# Patient Record
Sex: Male | Born: 1970
Health system: Southern US, Community
[De-identification: ages and names within clinical notes are randomized; demographics above are authoritative.]

## PROBLEM LIST (undated history)

## (undated) DIAGNOSIS — E785 Hyperlipidemia, unspecified: Secondary | ICD-10-CM

## (undated) DIAGNOSIS — D649 Anemia, unspecified: Secondary | ICD-10-CM

## (undated) DIAGNOSIS — R519 Headache, unspecified: Secondary | ICD-10-CM

## (undated) DIAGNOSIS — D72829 Elevated white blood cell count, unspecified: Secondary | ICD-10-CM

## (undated) DIAGNOSIS — H532 Diplopia: Secondary | ICD-10-CM

## (undated) DIAGNOSIS — C801 Malignant (primary) neoplasm, unspecified: Secondary | ICD-10-CM

## (undated) DIAGNOSIS — G4730 Sleep apnea, unspecified: Secondary | ICD-10-CM

## (undated) DIAGNOSIS — T7840XA Allergy, unspecified, initial encounter: Secondary | ICD-10-CM

## (undated) DIAGNOSIS — G8929 Other chronic pain: Secondary | ICD-10-CM

## (undated) DIAGNOSIS — C9110 Chronic lymphocytic leukemia of B-cell type not having achieved remission: Secondary | ICD-10-CM

## (undated) DIAGNOSIS — I10 Essential (primary) hypertension: Secondary | ICD-10-CM

## (undated) HISTORY — DX: Hyperlipidemia, unspecified: E78.5

## (undated) HISTORY — PX: BRAIN SURGERY: SHX531

## (undated) HISTORY — DX: Anemia, unspecified: D64.9

## (undated) HISTORY — DX: Chronic lymphocytic leukemia of B-cell type not having achieved remission: C91.10

## (undated) HISTORY — DX: Elevated white blood cell count, unspecified: D72.829

## (undated) HISTORY — DX: Allergy, unspecified, initial encounter: T78.40XA

## (undated) HISTORY — PX: FINGER SURGERY: SHX640

## (undated) HISTORY — PX: EYE SURGERY: SHX253

---

## 2007-02-19 ENCOUNTER — Ambulatory Visit: Admission: RE | Admit: 2007-02-19 | Discharge: 2007-02-19 | Payer: Self-pay | Admitting: Family Medicine

## 2007-02-26 ENCOUNTER — Ambulatory Visit: Payer: Self-pay | Admitting: Pulmonary Disease

## 2010-09-24 NOTE — Procedures (Signed)
NAMEJANICE, David Whitehead NO.:  [id number]   MEDICAL RECORD NO.:  [id number]          PATIENT TYPE:  OUT   LOCATION:  SLEEP CENTER                 FACILITY:  [facility]   PHYSICIAN:  Barbaraann Share, MD,FCCPDATE OF BIRTH:  10-Mar-1973   DATE OF STUDY:  02/19/2007                            NOCTURNAL POLYSOMNOGRAM   REFERRING PHYSICIAN:  Scott A. Luking, MD   INDICATION FOR STUDY:  Hypersomnia with sleep apnea.   EPWORTH SLEEPINESS SCORE:  16.   MEDICATIONS:   SLEEP ARCHITECTURE:  The patient had a total sleep time of 265 minutes  with decreased slow wave sleep as well as REM.  Sleep onset latency was  normal and REM onset was very prolonged at 293 minutes.  Sleep  efficiency was very poor at 68%.   RESPIRATORY DATA:  The patient was found to have 39 obstructive  hypopneas and no obstructive apneas for an apnea/hypopnea index of 9  events per hour.  The events were not positional but there was loud  snoring noted throughout.  The patient did not meet split night protocol  secondary to the small number of events.   OXYGEN DATA:  His O2 saturation was as low as 85% with the patient's  obstructive events.   CARDIAC DATA:  No clinically significant cardiac arrhythmias were noted.   MOVEMENT-PARASOMNIA:  Small numbers of leg jerks with no significant  sleep disruption.   IMPRESSIONS-RECOMMENDATIONS:  Mild obstructive sleep apnea/hypopnea  syndrome with an apnea/hypopnea index of 9 events per hour and oxygen  desaturation as low as 85%.  Treatment for this degree of sleep apnea  can include weight loss alone if applicable, upper airway surgery, oral  appliance, and also CPAP.  The decision to treat this degree of sleep  apnea should be based on how much this is impacting the patient's  quality of  life and also how the treatment options may effect his lifestyle.  This  mild degree of sleep apnea represents very little cardiovascular risk.      Barbaraann Share,  MD,FCCP  Diplomate, American Board of Sleep  Medicine  Electronically Signed     [facility]/MED[facility]  D:  02/26/2007 08:11:12  T:  02/26/2007 23:47:07  Job:  [id number]

## 2018-03-30 ENCOUNTER — Ambulatory Visit: Payer: Self-pay | Admitting: Family Medicine

## 2018-03-31 ENCOUNTER — Ambulatory Visit (HOSPITAL_COMMUNITY)
Admission: RE | Admit: 2018-03-31 | Discharge: 2018-03-31 | Disposition: A | Discharge status: Home / Self Care | Payer: BLUE CROSS/BLUE SHIELD | Source: Ambulatory Visit | Attending: Family Medicine | Admitting: Family Medicine

## 2018-03-31 ENCOUNTER — Ambulatory Visit (INDEPENDENT_AMBULATORY_CARE_PROVIDER_SITE_OTHER): Payer: BLUE CROSS/BLUE SHIELD | Admitting: Family Medicine

## 2018-03-31 VITALS — BP 114/72 | Temp 98.6°F | Wt 213.0 lb

## 2018-03-31 DIAGNOSIS — R590 Localized enlarged lymph nodes: Secondary | ICD-10-CM | POA: Insufficient documentation

## 2018-03-31 DIAGNOSIS — R682 Dry mouth, unspecified: Secondary | ICD-10-CM | POA: Diagnosis not present

## 2018-03-31 DIAGNOSIS — G4730 Sleep apnea, unspecified: Secondary | ICD-10-CM

## 2018-03-31 NOTE — Progress Notes (Signed)
   Subjective:    Patient ID: David Whitehead, male    DOB: 30-Nov-1970, 47 y.o.   MRN: [id number]  HPI Pt here today for swollen lymph nodes. Pt has some swollen under left arm and some on neck. Chiropractor noticed the ones on the neck last week. Not painful, sometimes tender if touched. No treatments.  This patient denies any night sweats He denies any fevers chills Denies fatigue tiredness He states he has been trying to watch his diet and try to lose weight over the past several months and states he is lost 50 pounds over the past several months Patient denies any history of lymphoma or cancers that he is aware of He works in [employer] and is around some chemicals He states the lymph node started up several months ago and recently have seemed to got bigger and appear in other places PMH benign otherwise  Review of Systems  Constitutional: Negative for activity change.  HENT: Negative for congestion and rhinorrhea.   Respiratory: Negative for cough and shortness of breath.   Cardiovascular: Negative for chest pain.  Gastrointestinal: Negative for abdominal pain, diarrhea, nausea and vomiting.  Genitourinary: Negative for dysuria and hematuria.  Neurological: Negative for weakness and headaches.  Psychiatric/Behavioral: Negative for behavioral problems and confusion.       Objective:   Physical Exam  Constitutional: He appears well-nourished.  Cardiovascular: Normal rate, regular rhythm and normal heart sounds.  No murmur heard. Pulmonary/Chest: Effort normal and breath sounds normal.  Musculoskeletal: He exhibits no edema.  Lymphadenopathy:    He has no cervical adenopathy.  Neurological: He is alert.  Psychiatric: His behavior is normal.  Vitals reviewed.  Large amount of lymph nodes are noted he has anterior cervical, posterior cervical, supraclavicular nodes, one under the left axilla.  His lungs do not reveal any crackles or rails heart does not reveal any murmur  abdomen is soft no masses are felt.  No obvious bruising or bleeding is noted       Assessment & Plan:  Significantly concerning for the possibility of a lymphoma or myelodysplastic condition Lab work is indicated as well as a chest x-ray More than likely will need either biopsy of the lymph nodes along with CAT scans Certainly when the test come back we will get oncology hematology involved The patient is aware that this could indicate cancer and we discussed this at length Follow-up will be based upon the results of the tests  Addendum chest x-ray came back looking normal CBC came back showing extremely elevated white blood count pointing toward the possibility of CLL Dr. Burney Gauze was spoken with who stated that they will see the patient on Thursday The patient was notified of this We will be more than happy to assist with any concerns or questions the patient has

## 2018-04-01 ENCOUNTER — Ambulatory Visit: Payer: Self-pay | Admitting: Hematology & Oncology

## 2018-04-01 ENCOUNTER — Ambulatory Visit: Payer: Self-pay

## 2018-04-01 ENCOUNTER — Telehealth: Payer: Self-pay | Admitting: Hematology

## 2018-04-01 ENCOUNTER — Other Ambulatory Visit: Payer: Self-pay | Admitting: Family Medicine

## 2018-04-01 ENCOUNTER — Other Ambulatory Visit: Payer: Self-pay | Admitting: Hematology

## 2018-04-01 ENCOUNTER — Telehealth: Payer: Self-pay | Admitting: Family Medicine

## 2018-04-01 ENCOUNTER — Other Ambulatory Visit: Payer: Self-pay | Admitting: Hematology & Oncology

## 2018-04-01 DIAGNOSIS — C9110 Chronic lymphocytic leukemia of B-cell type not having achieved remission: Secondary | ICD-10-CM

## 2018-04-01 DIAGNOSIS — D72829 Elevated white blood cell count, unspecified: Secondary | ICD-10-CM

## 2018-04-01 DIAGNOSIS — D473 Essential (hemorrhagic) thrombocythemia: Secondary | ICD-10-CM | POA: Insufficient documentation

## 2018-04-01 DIAGNOSIS — D649 Anemia, unspecified: Secondary | ICD-10-CM

## 2018-04-01 DIAGNOSIS — D75839 Thrombocytosis, unspecified: Secondary | ICD-10-CM | POA: Insufficient documentation

## 2018-04-01 DIAGNOSIS — D72820 Lymphocytosis (symptomatic): Secondary | ICD-10-CM

## 2018-04-01 DIAGNOSIS — R590 Localized enlarged lymph nodes: Secondary | ICD-10-CM

## 2018-04-01 HISTORY — DX: Elevated white blood cell count, unspecified: D72.829

## 2018-04-01 LAB — BASIC METABOLIC PANEL
BUN/Creatinine Ratio: 12 (ref 9–20)
BUN: 16 mg/dL (ref 6–24)
CO2: 23 mmol/L (ref 20–29)
Calcium: 9.2 mg/dL (ref 8.7–10.2)
Chloride: 104 mmol/L (ref 96–106)
Creatinine, Ser: 1.37 mg/dL — ABNORMAL HIGH (ref 0.76–1.27)
GFR calc Af Amer: 71 mL/min/{1.73_m2} (ref >59)
GFR calc non Af Amer: 62 mL/min/{1.73_m2} (ref >59)
Glucose: 89 mg/dL (ref 65–99)
Potassium: 4.3 mmol/L (ref 3.5–5.2)
Sodium: 142 mmol/L (ref 134–144)

## 2018-04-01 LAB — CBC WITH DIFFERENTIAL/PLATELET
Basophils Absolute: 0.2 10*3/uL (ref 0.0–0.2)
Basos: 0 %
EOS (ABSOLUTE): 0.3 10*3/uL (ref 0.0–0.4)
Eos: 0 %
Hematocrit: 35.2 % — ABNORMAL LOW (ref 37.5–51.0)
Hemoglobin: 10.7 g/dL — ABNORMAL LOW (ref 13.0–17.7)
Immature Grans (Abs): 0 10*3/uL (ref 0.0–0.1)
Immature Granulocytes: 0 %
Lymphocytes Absolute: 192.2 10*3/uL — ABNORMAL HIGH (ref 0.7–3.1)
Lymphs: 82 %
MCH: 26.5 pg — ABNORMAL LOW (ref 26.6–33.0)
MCHC: 30.4 g/dL — ABNORMAL LOW (ref 31.5–35.7)
MCV: 87 fL (ref 79–97)
Monocytes Absolute: 38.6 10*3/uL — ABNORMAL HIGH (ref 0.1–0.9)
Monocytes: 17 %
Neutrophils Absolute: 2.4 10*3/uL (ref 1.4–7.0)
Neutrophils: 1 %
Platelets: 76 10*3/uL — CL (ref 150–450)
RBC: 4.04 x10E6/uL — ABNORMAL LOW (ref 4.14–5.80)
RDW: 15.5 % — ABNORMAL HIGH (ref 12.3–15.4)
WBC: 233.9 10*3/uL (ref 3.4–10.8)

## 2018-04-01 LAB — HEPATIC FUNCTION PANEL
ALT: 16 IU/L (ref 0–44)
AST: 25 IU/L (ref 0–40)
Albumin: 4.6 g/dL (ref 3.5–5.5)
Alkaline Phosphatase: 138 IU/L — ABNORMAL HIGH (ref 39–117)
Bilirubin Total: 0.4 mg/dL (ref 0.0–1.2)
Bilirubin, Direct: 0.12 mg/dL (ref 0.00–0.40)
Total Protein: 5.9 g/dL — ABNORMAL LOW (ref 6.0–8.5)

## 2018-04-01 LAB — SEDIMENTATION RATE: Sed Rate: 2 mm/hr (ref 0–15)

## 2018-04-01 NOTE — Telephone Encounter (Signed)
Referral placed. Spoke with [place] and informed her that I placed referral and that patient was going to be seen today.

## 2018-04-01 NOTE — Telephone Encounter (Signed)
Please put in referral to Dr Marin Olp They will see him today Pt aware Dr Marin Olp office will call patient

## 2018-04-01 NOTE — Telephone Encounter (Signed)
LM on VM for patient with new appointment date/time r/s from 11/21 / OK per Dr Maylon Peppers to add to schedule on 11/25

## 2018-04-01 NOTE — Telephone Encounter (Signed)
[place] in regards to patient. Spoke with Lorriane Shire and gave her Dr.Scott cell phone number. Also stated that Dr.Scott would like to speak Lattie Haw. Lorriane Shire states she will get the message to Dr.Ennever.

## 2018-04-01 NOTE — Progress Notes (Addendum)
[facility] CONSULT NOTE  Patient Care Team: [name], MD as PCP - General (Family Medicine)  HEME/ONC OVERVIEW: 1. 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 Normocytic anemia 3. Thrombocytopenia   ASSESSMENT & PLAN:  Marked leukocytosis w/ lymphocytosis, bulky lymphadenopathy -I reviewed the patient's records in detail, including recent PCP clinic notes -In summary, patient presented to [facility] on 03/31/2018 for swollen lymph nodes unintentional weight loss; CBC showed WBC to 234K with 82% lymphocyotes; smudge cells noted on smear review -CBC today showed WBC 221k, predominantly lymphocytes  -I reviewed the peripheral blood smear, which showed numerous large atypical lymphocytes with scattered smudge cells -Peripheral blood flow showed monoclonal B-cell population (CD5, CD23 positive), 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  -CLL FISH panel and t(11;14) pending; acute hepatitis panel also ordered  -Furthermore, given that the presentation is not entirely consistent with CLL, I will refer the patient to general surgery for excision LN biopsy to rule out any other forms of lymphoma/leukemia  Normocytic anemia -Hgb ~11 -Given possible CLL, [place] has been ordered to rule out hemolytic anemia -We will continue to monitor it for now   Thrombocytopenia -Plts ~70k -Patient denies any abnormal bleeding or bruising  -We will continue   Health maintenance -I counseled the patient on the importance of hand hygiene and up-to-date vaccinations -Patient expressed understanding, and was agreeable to receive influenza vaccine today  Orders Placed This Encounter  Procedures  . CT CHEST W CONTRAST    Standing Status:   Future    Standing Expiration Date:    04/05/2019    Order Specific Question:   If indicated for the ordered procedure, I authorize the administration of contrast media per Radiology protocol    Answer:   Yes    Order Specific Question:   Preferred imaging location?    Answer:   [facility] Specific Question:   Radiology Contrast Protocol - do NOT remove file path    Answer:   \\[email or web address].pdf  . CT ABDOMEN PELVIS W CONTRAST    Standing Status:   Future    Standing Expiration Date:   04/05/2019    Order Specific Question:   If indicated for the ordered procedure, I authorize the administration of contrast media per Radiology protocol    Answer:   Yes    Order Specific Question:   Preferred imaging location?    Answer:   [facility] Specific Question:   Is Oral Contrast requested for this exam?    Answer:   Yes, Per Radiology protocol    Order Specific Question:   Radiology Contrast Protocol - do NOT remove file path    Answer:   \\[email or web address]\Radiant\CTProtocols.pdf  . CBC with Differential ([facility] Only)    Standing Status:   Future    Standing Expiration Date:   05/10/2019  . CMP ([facility] only)    Standing Status:   Future    Standing Expiration Date:   05/10/2019  . Lactate dehydrogenase    Standing Status:   Future    Standing Expiration Date:   05/10/2019  . Ambulatory referral to General Surgery    Referral Priority:   Urgent    Referral Type:   Surgical    Referral Reason:  Specialty Services Required    Requested Specialty:   General Surgery    Number of Visits Requested:   1    A total of more than 45 minutes were spent face-to-face with the patient during this encounter and over half of that time was spent on counseling and coordination of care as outlined above.    All questions were answered. The patient knows to call the clinic with any problems, questions or concerns.  Return to clinic in 1 week to follow up lab  studies, CT CAP and to discuss treatment.   [name], MD 04/05/2018 1:38 PM   CHIEF COMPLAINTS/PURPOSE OF CONSULTATION:  "I am here for enlarged Ln's"  HISTORY OF PRESENTING ILLNESS:  David Whitehead 47 y.o. male is here because of newly diagnosed market leukocytosis with lymphocytosis and lymphadenopathy.  He is accompanied by a friend, who is an anesthesiologist and [facility].  Patient reports that he first noticed mildly swollen subcentimeter right posterior occipital lymph nodes in the end of June 2019.  He attributed to these lymph nodes to possible sinus infection at that time, and did not seek further evaluation.  In September 2019, he also noticed new enlarged cervical lymph nodes bilaterally, as well as enlarging posterior S of the lymph nodes.  In mid November 2019, he went to a chiropractor, who noted significantly enlarged bilateral cervical lymph nodes and recommended him to seek care immediately.  Since then, the patient has also noticed enlarged left axillary lymph nodes.  Patient reports that he has been trying to lose weight regular exercise and diet for the past year, but over the past few months, his weight has been stable.  He denies any fever, chill, night sweats, chest pain, dyspnea, abdominal pain, nausea, vomiting, diarrhea, or abnormal bleeding or bruising.  He works full-time as a Nature conservation officer.  MEDICAL HISTORY:  History reviewed. No pertinent past medical history.  SURGICAL HISTORY: History reviewed. No pertinent surgical history.  SOCIAL HISTORY: Social History   Socioeconomic History  . Marital status: Married    Spouse name: [name]  . Number of children: Not on file  . Years of education: Not on file  . Highest education level: Not on file  Occupational History  . Not on file  Social Needs  . Financial resource strain: Not on file  . Food insecurity:    Worry: Not on file    Inability: Not on file  . Transportation needs:    Medical: Not on file     Non-medical: Not on file  Tobacco Use  . Smoking status: Never Smoker  . Smokeless tobacco: Never Used  Substance and Sexual Activity  . Alcohol use: Not on file  . Drug use: Never  . Sexual activity: Not on file  Lifestyle  . Physical activity:    Days per week: Not on file    Minutes per session: Not on file  . Stress: Not on file  Relationships  . Social connections:    Talks on phone: Not on file    Gets together: Not on file    Attends religious service: Not on file    Active member of club or organization: Not on file    Attends meetings of clubs or organizations: Not on file    Relationship status: Not on file  . Intimate partner violence:    Fear of current or ex partner: Not on file    Emotionally abused: Not on file    Physically abused: Not on  file    Forced sexual activity: Not on file  Other Topics Concern  . Not on file  Social History Narrative  . Not on file    FAMILY HISTORY: History reviewed. No pertinent family history.  ALLERGIES:  is allergic to amoxicillin.  MEDICATIONS:  No current outpatient medications on file.   Current Facility-Administered Medications  Medication Dose Route Frequency Provider Last Rate Last Dose  . Influenza vac split quadrivalent PF (FLUARIX) injection 0.5 mL  0.5 mL Intramuscular Once [name], MD        REVIEW OF SYSTEMS:   Constitutional: ( - ) fevers, ( - )  chills , ( - ) night sweats Eyes: ( - ) blurriness of vision, ( - ) double vision, ( - ) watery eyes Ears, nose, mouth, throat, and face: ( - ) mucositis, ( - ) sore throat Respiratory: ( - ) cough, ( - ) dyspnea, ( - ) wheezes Cardiovascular: ( - ) palpitation, ( - ) chest discomfort, ( - ) lower extremity swelling Gastrointestinal:  ( - ) nausea, ( - ) heartburn, ( - ) change in bowel habits Skin: ( - ) abnormal skin rashes Lymphatics: ( - ) new lymphadenopathy, ( - ) easy bruising Neurological: ( - ) numbness, ( - ) tingling, ( - ) new  weaknesses Behavioral/Psych: ( - ) mood change, ( - ) new changes  All other systems were reviewed with the patient and are negative.  PHYSICAL EXAMINATION: ECOG PERFORMANCE STATUS: 0 - Asymptomatic  Vitals:   04/05/18 1221  BP: 122/77  Pulse: 84  Resp: 20  Temp: 98.4 F (36.9 C)  SpO2: 100%   Filed Weights   04/05/18 1221  Weight: 209 lb 12 oz (95.1 kg)    GENERAL: alert, no distress and comfortable SKIN: skin color, texture, turgor are normal, no rashes or significant lesions EYES: conjunctiva are pink and non-injected, sclera clear OROPHARYNX: no exudate, no erythema; lips, buccal mucosa, and tongue normal  LYMPH:  Palpable multiple posterior occipital LN's (~1cm), bulky cervical LN's bilaterally, bulky left axillary LN's  LUNGS: clear to auscultation and percussion with normal breathing effort HEART: regular rate & rhythm, no murmurs, no lower extremity edema ABDOMEN: soft, non-tender, non-distended, normal bowel sounds, no liver or spleen enlargement palpated  Musculoskeletal: no cyanosis of digits and no clubbing  PSYCH: alert & oriented x 3, fluent speech NEURO: no focal motor/sensory deficits  LABORATORY DATA:  I have reviewed the data as listed Lab Results  Component Value Date   WBC 221.7 (HH) 04/05/2018   HGB 10.8 (L) 04/05/2018   HCT 36.2 (L) 04/05/2018   MCV 88.3 04/05/2018   PLT 76 (L) 04/05/2018   Lab Results  Component Value Date   NA 142 03/31/2018   K 4.3 03/31/2018   CL 104 03/31/2018   CO2 23 03/31/2018    RADIOGRAPHIC STUDIES: I have personally reviewed the radiological images as listed and agreed with the findings in the report. Dg Chest 2 View  Result Date: 03/31/2018 CLINICAL DATA:  Swollen lymph nodes for 1 week.  Denies chest pain. EXAM: CHEST - 2 VIEW COMPARISON:  None. FINDINGS: The heart size and mediastinal contours are within normal limits. Both lungs are clear. The visualized skeletal structures are unremarkable. IMPRESSION: No  active cardiopulmonary disease. Electronically Signed   By: Kathreen Devoid   On: 03/31/2018 17:45

## 2018-04-05 ENCOUNTER — Inpatient Hospital Stay: Payer: BLUE CROSS/BLUE SHIELD | Attending: Hematology & Oncology

## 2018-04-05 ENCOUNTER — Telehealth: Payer: Self-pay | Admitting: Hematology

## 2018-04-05 ENCOUNTER — Encounter: Payer: Self-pay | Admitting: Hematology

## 2018-04-05 ENCOUNTER — Inpatient Hospital Stay (HOSPITAL_BASED_OUTPATIENT_CLINIC_OR_DEPARTMENT_OTHER): Payer: BLUE CROSS/BLUE SHIELD | Admitting: Hematology

## 2018-04-05 ENCOUNTER — Encounter: Payer: Self-pay | Admitting: *Deleted

## 2018-04-05 ENCOUNTER — Other Ambulatory Visit: Payer: Self-pay

## 2018-04-05 DIAGNOSIS — C9110 Chronic lymphocytic leukemia of B-cell type not having achieved remission: Secondary | ICD-10-CM

## 2018-04-05 DIAGNOSIS — Z23 Encounter for immunization: Secondary | ICD-10-CM | POA: Diagnosis not present

## 2018-04-05 DIAGNOSIS — D649 Anemia, unspecified: Secondary | ICD-10-CM

## 2018-04-05 DIAGNOSIS — D696 Thrombocytopenia, unspecified: Secondary | ICD-10-CM | POA: Diagnosis not present

## 2018-04-05 DIAGNOSIS — D72820 Lymphocytosis (symptomatic): Secondary | ICD-10-CM | POA: Diagnosis not present

## 2018-04-05 DIAGNOSIS — R590 Localized enlarged lymph nodes: Secondary | ICD-10-CM | POA: Diagnosis not present

## 2018-04-05 DIAGNOSIS — D72829 Elevated white blood cell count, unspecified: Secondary | ICD-10-CM | POA: Diagnosis not present

## 2018-04-05 LAB — DIRECT ANTIGLOBULIN TEST (NOT AT ARMC)
DAT, IgG: NEGATIVE
DAT, complement: NEGATIVE

## 2018-04-05 LAB — CBC WITH DIFFERENTIAL (CANCER CENTER ONLY)
Abs Immature Granulocytes: 0.23 10*3/uL — ABNORMAL HIGH (ref 0.00–0.07)
Basophils Absolute: 0.3 10*3/uL — ABNORMAL HIGH (ref 0.0–0.1)
Basophils Relative: 0 %
Eosinophils Absolute: 0.3 10*3/uL (ref 0.0–0.5)
Eosinophils Relative: 0 %
HCT: 36.2 % — ABNORMAL LOW (ref 39.0–52.0)
Hemoglobin: 10.8 g/dL — ABNORMAL LOW (ref 13.0–17.0)
Immature Granulocytes: 0 %
Lymphocytes Relative: 82 %
Lymphs Abs: 180.7 10*3/uL — ABNORMAL HIGH (ref 0.7–4.0)
MCH: 26.3 pg (ref 26.0–34.0)
MCHC: 29.8 g/dL — ABNORMAL LOW (ref 30.0–36.0)
MCV: 88.3 fL (ref 80.0–100.0)
Monocytes Absolute: 37.7 10*3/uL — ABNORMAL HIGH (ref 0.1–1.0)
Monocytes Relative: 17 %
Neutro Abs: 2.5 10*3/uL (ref 1.7–7.7)
Neutrophils Relative %: 1 %
Platelet Count: 76 10*3/uL — ABNORMAL LOW (ref 150–400)
RBC: 4.1 MIL/uL — ABNORMAL LOW (ref 4.22–5.81)
RDW: 16.7 % — ABNORMAL HIGH (ref 11.5–15.5)
WBC Count: 221.7 10*3/uL (ref 4.0–10.5)
nRBC: 0 % (ref 0.0–0.2)

## 2018-04-05 LAB — CMP (CANCER CENTER ONLY)
ALT: 22 U/L (ref 0–44)
AST: 32 U/L (ref 15–41)
Albumin: 4.5 g/dL (ref 3.5–5.0)
Alkaline Phosphatase: 143 U/L — ABNORMAL HIGH (ref 38–126)
Anion gap: 8 (ref 5–15)
BUN: 16 mg/dL (ref 6–20)
CO2: 25 mmol/L (ref 22–32)
Calcium: 9.3 mg/dL (ref 8.9–10.3)
Chloride: 108 mmol/L (ref 98–111)
Creatinine: 1.17 mg/dL (ref 0.61–1.24)
GFR, Est AFR Am: >60 mL/min (ref >60)
GFR, Estimated: >60 mL/min (ref >60)
Glucose, Bld: 84 mg/dL (ref 70–99)
Potassium: 4.3 mmol/L (ref 3.5–5.1)
Sodium: 141 mmol/L (ref 135–145)
Total Bilirubin: 0.5 mg/dL (ref 0.3–1.2)
Total Protein: 6.4 g/dL — ABNORMAL LOW (ref 6.5–8.1)

## 2018-04-05 LAB — LACTATE DEHYDROGENASE: LDH: 435 U/L — ABNORMAL HIGH (ref 98–192)

## 2018-04-05 LAB — URIC ACID: Uric Acid, Serum: 7.6 mg/dL (ref 3.7–8.6)

## 2018-04-05 LAB — SAVE SMEAR(SSMR), FOR PROVIDER SLIDE REVIEW

## 2018-04-05 MED ORDER — INFLUENZA VAC SPLIT QUAD 0.5 ML IM SUSY
PREFILLED_SYRINGE | INTRAMUSCULAR | Status: AC
  Filled 2018-04-05: qty 0.5

## 2018-04-05 MED ORDER — INFLUENZA VAC SPLIT QUAD 0.5 ML IM SUSY
0.5000 mL | PREFILLED_SYRINGE | Freq: Once | INTRAMUSCULAR | Status: AC
  Administered 2018-04-05: 0.5 mL via INTRAMUSCULAR

## 2018-04-05 NOTE — Telephone Encounter (Signed)
I spoke with Judson Roch at [facility] regarding appointment for David Whitehead.  She stated that she spoke with him and he is aware of appointment scheduled for 04/06/18 @ 3:15 at the [facility] office of [facility] with Dr Fanny Skates

## 2018-04-05 NOTE — Progress Notes (Signed)
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 Hanley Seamen patient "Your Patient Navigator" handout which explains my role, areas in which I am able to help, and all the contact information for myself and the office. Also gave patient MD and Navigator business card. Reviewed with patient the general overview of expected course after initial diagnosis and time frame for all steps to be completed.  Patient completed visit with Dr. Maylon Peppers  Revisited with patient after MD visit. Patient will need:   CT Scan: Scheduled for 04/07/2018 at 9:00. CT instructions given. Dr Dalbert Batman for excisional biopsy Follow Up with Dr Maylon Peppers: 04/12/2018 at 9:15a  Patient is told to reach out to me if he hasn't received an appointment with Dr Dalbert Batman by Wednesday. He agrees.   Calendar with above appointments printed and given to patient.   Patient will follow up with Dr Maylon Peppers in approximately one week to review results from work up.  Patient understands all follow up procedures and expectations. They have my number to reach out for any further clarification or additional needs. Will call patient in 5-7 days to see if any further needs have presented, or if patient has any further questions or needs.

## 2018-04-05 NOTE — Telephone Encounter (Signed)
Appointments scheduled Vernice Jefferson material provided/ referral was place within [place] and dr [employer] office will contact patient to schedule appointment/ avs/calendar printed per 11/25 los

## 2018-04-06 ENCOUNTER — Other Ambulatory Visit: Payer: Self-pay | Admitting: General Surgery

## 2018-04-06 ENCOUNTER — Telehealth: Payer: Self-pay | Admitting: Hematology

## 2018-04-06 DIAGNOSIS — D72829 Elevated white blood cell count, unspecified: Secondary | ICD-10-CM | POA: Diagnosis not present

## 2018-04-06 DIAGNOSIS — G4730 Sleep apnea, unspecified: Secondary | ICD-10-CM | POA: Diagnosis not present

## 2018-04-06 DIAGNOSIS — D696 Thrombocytopenia, unspecified: Secondary | ICD-10-CM | POA: Diagnosis not present

## 2018-04-06 DIAGNOSIS — R591 Generalized enlarged lymph nodes: Secondary | ICD-10-CM | POA: Diagnosis not present

## 2018-04-06 LAB — HEPATITIS PANEL, ACUTE
HCV Ab: <0.1 s/co ratio (ref 0.0–0.9)
Hep A IgM: NEGATIVE
Hep B C IgM: NEGATIVE
Hepatitis B Surface Ag: NEGATIVE

## 2018-04-06 LAB — IGG, IGA, IGM
IgA: 8 mg/dL — ABNORMAL LOW (ref 90–386)
IgG (Immunoglobin G), Serum: 411 mg/dL — ABNORMAL LOW (ref 700–1600)
IgM (Immunoglobulin M), Srm: <5 mg/dL — ABNORMAL LOW (ref 20–172)

## 2018-04-06 LAB — HAPTOGLOBIN: Haptoglobin: 69 mg/dL (ref 34–200)

## 2018-04-06 LAB — BETA 2 MICROGLOBULIN, SERUM: Beta-2 Microglobulin: 4.9 mg/L — ABNORMAL HIGH (ref 0.6–2.4)

## 2018-04-06 NOTE — Telephone Encounter (Signed)
I called and spoke with patient regarding r/s of his CT scan for [facility] due to [facility] being down.  Appointment was r/s to [facility] for 11/27 @ 1:30. Patient was given new time and location

## 2018-04-07 ENCOUNTER — Ambulatory Visit (HOSPITAL_COMMUNITY)
Admission: RE | Admit: 2018-04-07 | Discharge: 2018-04-07 | Disposition: A | Discharge status: Home / Self Care | Payer: BLUE CROSS/BLUE SHIELD | Source: Ambulatory Visit | Attending: Hematology | Admitting: Hematology

## 2018-04-07 ENCOUNTER — Other Ambulatory Visit: Payer: Self-pay | Admitting: Hematology

## 2018-04-07 ENCOUNTER — Ambulatory Visit (HOSPITAL_BASED_OUTPATIENT_CLINIC_OR_DEPARTMENT_OTHER): Payer: BLUE CROSS/BLUE SHIELD

## 2018-04-07 ENCOUNTER — Ambulatory Visit (HOSPITAL_BASED_OUTPATIENT_CLINIC_OR_DEPARTMENT_OTHER): Admission: RE | Admit: 2018-04-07 | Payer: BLUE CROSS/BLUE SHIELD | Source: Ambulatory Visit

## 2018-04-07 DIAGNOSIS — R590 Localized enlarged lymph nodes: Secondary | ICD-10-CM

## 2018-04-07 DIAGNOSIS — D72820 Lymphocytosis (symptomatic): Secondary | ICD-10-CM

## 2018-04-07 DIAGNOSIS — R599 Enlarged lymph nodes, unspecified: Secondary | ICD-10-CM | POA: Diagnosis not present

## 2018-04-07 LAB — FLOW CYTOMETRY

## 2018-04-07 MED ORDER — IOHEXOL 300 MG/ML  SOLN
100.0000 mL | Freq: Once | INTRAMUSCULAR | Status: AC | PRN
  Administered 2018-04-07: 100 mL via INTRAVENOUS

## 2018-04-07 MED ORDER — SODIUM CHLORIDE (PF) 0.9 % IJ SOLN
INTRAMUSCULAR | Status: AC
  Filled 2018-04-07: qty 50

## 2018-04-09 ENCOUNTER — Ambulatory Visit: Payer: Self-pay | Admitting: Hematology & Oncology

## 2018-04-09 ENCOUNTER — Inpatient Hospital Stay: Payer: BLUE CROSS/BLUE SHIELD

## 2018-04-10 NOTE — H&P (Signed)
David Whitehead Location: [place] [facility] Patient #: [id number] DOB: 10/04/1970 Married / Language: English / Race: White Male       History of Present Illness       The patient is a 47 year old male who presents with lymphadenopathy. this is a very pleasant 47 year old man. 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 He has a friend of David Whitehead of the anesthesia Department. He is referred by Dr. Maylon Peppers of the medical oncology group for consideration of lymph node biopsy to clarify his diagnosis. [name] is his PCP.      He is generally healthy. He has noticed some occipital lumps since June. No fevers or night sweats. No pain. He's lost 50 pounds in the last 11 months but that was intentional that his weight is actually been stable for a couple of months. More recently he's noticed lumps on both sides of his neck, under his arms and maybe a little bit in his groins. No pulmonary or GI symptoms. CBC showed profound leukocytosis and a platelet count of 76,000. CT scan of neck chest abdomen and pelvis is scheduled for tomorrow.      past history significant for sleep apnea with CPAP. Mother living has sarcoidosis, peptic ulcer disease due to steroid use had a TIA. Father died of colon cancer. No family history of blood dyscrasias. Physical history he [place] is married no children. Denies tobacco or alcohol. He is a Programmer, applications       On exam he has enlarged lymph nodes in bilateral neck, bilateral axilla, and some smaller nodes in the groin. He also has splenomegaly About that the best biopsy site would be the axilla and S1 were going to do Proceed with CT scanning tomorrow Set up axillary lymph node biopsy urgently check labs and coags immediately preop. I discussed the indications, details, techniques, and numerous risk of the surgery with him. He is aware the risks of bleeding, infection, arm numbness, arm swelling, fluid  collections and other & problems. He understands all these issues well. All his questions are answered. He agrees with this plan  I told him that I would be happy to put a Port-A-Cath in if it came to that. He understands that because his father had a Port-A-Cath for his colon cancer   Past Surgical History No pertinent past surgical history   Diagnostic Studies History Colonoscopy  never  Allergies  Amoxicillin *PENICILLINS*  Rash. Allergies Reconciled   Medication History No Current Medications Medications Reconciled  Social History Alcohol use  Remotely quit alcohol use. Caffeine use  Carbonated beverages, Tea. No drug use  Tobacco use  Never smoker.  Family History  Arthritis  Mother. Cerebrovascular Accident  Mother. Colon Cancer  Father. Hypertension  Father, Mother. Migraine Headache  Mother. Prostate Cancer  Father. Respiratory Condition  Mother.  Other Problems Back Pain  Sleep Apnea     Review of Systems  General Not Present- Appetite Loss, Chills, Fatigue, Fever, Night Sweats, Weight Gain and Weight Loss. Skin Not Present- Change in Wart/Mole, Dryness, Hives, Jaundice, New Lesions, Non-Healing Wounds, Rash and Ulcer. HEENT Present- Wears glasses/contact lenses. Not Present- Earache, Hearing Loss, Hoarseness, Nose Bleed, Oral Ulcers, Ringing in the Ears, Seasonal Allergies, Sinus Pain, Sore Throat, Visual Disturbances and Yellow Eyes. Respiratory Present- Snoring. Not Present- Bloody sputum, Chronic Cough, Difficulty Breathing and Wheezing. Breast Not Present- Breast Mass, Breast Pain, Nipple Discharge and Skin Changes. Cardiovascular Not Present- Chest Pain, Difficulty  Breathing Lying Down, Leg Cramps, Palpitations, Rapid Heart Rate, Shortness of Breath and Swelling of Extremities. Gastrointestinal Not Present- Abdominal Pain, Bloating, Bloody Stool, Change in Bowel Habits, Chronic diarrhea, Constipation, Difficulty Swallowing,  Excessive gas, Gets full quickly at meals, Hemorrhoids, Indigestion, Nausea, Rectal Pain and Vomiting. Male Genitourinary Not Present- Blood in Urine, Change in Urinary Stream, Frequency, Impotence, Nocturia, Painful Urination, Urgency and Urine Leakage. Musculoskeletal Not Present- Back Pain, Joint Pain, Joint Stiffness, Muscle Pain, Muscle Weakness and Swelling of Extremities. Neurological Not Present- Decreased Memory, Fainting, Headaches, Numbness, Seizures, Tingling, Tremor, Trouble walking and Weakness. Psychiatric Not Present- Anxiety, Bipolar, Change in Sleep Pattern, Depression, Fearful and Frequent crying. Endocrine Not Present- Cold Intolerance, Excessive Hunger, Hair Changes, Heat Intolerance, Hot flashes and New Diabetes. Hematology Present- Gland problems. Not Present- Blood Thinners, Easy Bruising, Excessive bleeding, HIV and Persistent Infections.  Vitals  Weight: 209.4 lb Height: 70in Body Surface Area: 2.13 m Body Mass Index: 30.05 kg/m  Temp.: 98.27F(Temporal)  Pulse: 80 (Regular)  P.OX: 98% (Room air) BP: 130/78 (Sitting, Left Arm, Standard)    Physical Exam  General Mental Status-Alert. General Appearance-Consistent with stated age. Hydration-Well hydrated. Voice-Normal.  Head and Neck Head-normocephalic, atraumatic with no lesions or palpable masses. Trachea-midline. Thyroid Gland Characteristics - normal size and consistency.  Eye Eyeball - Bilateral-Extraocular movements intact. Sclera/Conjunctiva - Bilateral-No scleral icterus.  Chest and Lung Exam Chest and lung exam reveals -quiet, even and easy respiratory effort with no use of accessory muscles and on auscultation, normal breath sounds, no adventitious sounds and normal vocal resonance. Inspection Chest Wall - Normal. Back - normal.  Cardiovascular Cardiovascular examination reveals -normal heart sounds, regular rate and rhythm with no murmurs and normal pedal  pulses bilaterally.  Abdomen Inspection Inspection of the abdomen reveals - No Hernias. Skin - Scar - no surgical scars. Palpation/Percussion Palpation and Percussion of the abdomen reveal - Soft, Non Tender, No Rebound tenderness and No Rigidity (guarding). Note: spleen obviously enlarged. palpable at least 5 cm below the left costal margin and nontender.liver not enlarged. Auscultation Auscultation of the abdomen reveals - Bowel sounds normal.  Neurologic Neurologic evaluation reveals -alert and oriented x 3 with no impairment of recent or remote memory. Mental Status-Normal.  Musculoskeletal Normal Exam - Left-Upper Extremity Strength Normal and Lower Extremity Strength Normal. Normal Exam - Right-Upper Extremity Strength Normal and Lower Extremity Strength Normal.  Lymphatic Note: in the neck trachea is midline. Skin is healthy. There are occipital lymph nodes, bilateral posterior triangle lymph nodes, and a right parotid area mass. These are all more than 2 cm in size. There are multiple palpable nontender mobile nodes in both axilla There are smaller but obviously pathologic nodes in the inguinal areas     Assessment & Plan   LYMPHADENOPATHY (R59.1)  you have developed enlargement of multiple lymph nodes in your neck, under arms, and groins Your spleen is enlarged and palpable Your CBC blood work shows significant elevation of your white blood cell count and your platelet count is low  you will be scheduled for an axillary lymph node biopsy as soon as possible in the near future to help clarify your diagnosis We have discussed the indications, techniques, and risk of the surgery in detail  Go ahead with your CT scan tomorrow as planned  Dr. Darrel Hoover office will call you to set up surgery  SLEEP APNEA IN ADULT (G47.30) THROMBOCYTOPENIA (D69.6) LEUKOCYTOSIS, UNSPECIFIED TYPE (G95.621)    Edsel Petrin. Dalbert Batman, M.D., [facility] General  and Minimally invasive Surgery Breast and Colorectal Surgery Office:   [phone] Pager:   [phone]

## 2018-04-12 ENCOUNTER — Telehealth: Payer: Self-pay | Admitting: Pharmacy Technician

## 2018-04-12 ENCOUNTER — Inpatient Hospital Stay (HOSPITAL_BASED_OUTPATIENT_CLINIC_OR_DEPARTMENT_OTHER): Payer: BLUE CROSS/BLUE SHIELD | Admitting: Hematology

## 2018-04-12 ENCOUNTER — Encounter: Payer: Self-pay | Admitting: Hematology

## 2018-04-12 ENCOUNTER — Telehealth: Payer: Self-pay | Admitting: Pharmacist

## 2018-04-12 ENCOUNTER — Other Ambulatory Visit: Payer: Self-pay

## 2018-04-12 ENCOUNTER — Inpatient Hospital Stay: Payer: BLUE CROSS/BLUE SHIELD | Attending: Hematology & Oncology

## 2018-04-12 VITALS — BP 112/71 | HR 78 | Temp 98.2°F | Resp 18 | Ht 70.0 in | Wt 209.8 lb

## 2018-04-12 DIAGNOSIS — R7989 Other specified abnormal findings of blood chemistry: Secondary | ICD-10-CM

## 2018-04-12 DIAGNOSIS — D649 Anemia, unspecified: Secondary | ICD-10-CM | POA: Diagnosis not present

## 2018-04-12 DIAGNOSIS — D696 Thrombocytopenia, unspecified: Secondary | ICD-10-CM | POA: Diagnosis not present

## 2018-04-12 DIAGNOSIS — M25562 Pain in left knee: Secondary | ICD-10-CM | POA: Diagnosis not present

## 2018-04-12 DIAGNOSIS — D72820 Lymphocytosis (symptomatic): Secondary | ICD-10-CM

## 2018-04-12 DIAGNOSIS — D72829 Elevated white blood cell count, unspecified: Secondary | ICD-10-CM

## 2018-04-12 DIAGNOSIS — Z5181 Encounter for therapeutic drug level monitoring: Secondary | ICD-10-CM

## 2018-04-12 DIAGNOSIS — C9110 Chronic lymphocytic leukemia of B-cell type not having achieved remission: Secondary | ICD-10-CM | POA: Insufficient documentation

## 2018-04-12 DIAGNOSIS — R590 Localized enlarged lymph nodes: Secondary | ICD-10-CM

## 2018-04-12 DIAGNOSIS — Z9189 Other specified personal risk factors, not elsewhere classified: Secondary | ICD-10-CM

## 2018-04-12 DIAGNOSIS — R161 Splenomegaly, not elsewhere classified: Secondary | ICD-10-CM | POA: Insufficient documentation

## 2018-04-12 LAB — CBC WITH DIFFERENTIAL (CANCER CENTER ONLY)
Abs Immature Granulocytes: 0.2 10*3/uL — ABNORMAL HIGH (ref 0.00–0.07)
Basophils Absolute: 0.1 10*3/uL (ref 0.0–0.1)
Basophils Relative: 0 %
Eosinophils Absolute: 0.3 10*3/uL (ref 0.0–0.5)
Eosinophils Relative: 0 %
HCT: 35.2 % — ABNORMAL LOW (ref 39.0–52.0)
Hemoglobin: 10.3 g/dL — ABNORMAL LOW (ref 13.0–17.0)
Immature Granulocytes: 0 %
Lymphocytes Relative: 83 %
Lymphs Abs: 159.1 10*3/uL — ABNORMAL HIGH (ref 0.7–4.0)
MCH: 26.1 pg (ref 26.0–34.0)
MCHC: 29.3 g/dL — ABNORMAL LOW (ref 30.0–36.0)
MCV: 89.3 fL (ref 80.0–100.0)
Monocytes Absolute: 30.8 10*3/uL — ABNORMAL HIGH (ref 0.1–1.0)
Monocytes Relative: 16 %
Neutro Abs: 2.3 10*3/uL (ref 1.7–7.7)
Neutrophils Relative %: 1 %
Platelet Count: 73 10*3/uL — ABNORMAL LOW (ref 150–400)
RBC: 3.94 MIL/uL — ABNORMAL LOW (ref 4.22–5.81)
RDW: 16.9 % — ABNORMAL HIGH (ref 11.5–15.5)
WBC Count: 192.8 10*3/uL (ref 4.0–10.5)
nRBC: 0 % (ref 0.0–0.2)

## 2018-04-12 LAB — CMP (CANCER CENTER ONLY)
ALT: 13 U/L (ref 0–44)
AST: 18 U/L (ref 15–41)
Albumin: 4.3 g/dL (ref 3.5–5.0)
Alkaline Phosphatase: 118 U/L (ref 38–126)
Anion gap: 6 (ref 5–15)
BUN: 16 mg/dL (ref 6–20)
CO2: 28 mmol/L (ref 22–32)
Calcium: 8.9 mg/dL (ref 8.9–10.3)
Chloride: 108 mmol/L (ref 98–111)
Creatinine: 1.12 mg/dL (ref 0.61–1.24)
GFR, Est AFR Am: >60 mL/min (ref >60)
GFR, Estimated: >60 mL/min (ref >60)
Glucose, Bld: 99 mg/dL (ref 70–99)
Potassium: 4.3 mmol/L (ref 3.5–5.1)
Sodium: 142 mmol/L (ref 135–145)
Total Bilirubin: 0.4 mg/dL (ref 0.3–1.2)
Total Protein: 5.6 g/dL — ABNORMAL LOW (ref 6.5–8.1)

## 2018-04-12 LAB — LACTATE DEHYDROGENASE: LDH: 419 U/L — ABNORMAL HIGH (ref 98–192)

## 2018-04-12 MED ORDER — ALLOPURINOL 300 MG PO TABS: 300.0000 mg | ORAL_TABLET | Freq: Every day | ORAL | 3 refills | Status: DC

## 2018-04-12 MED ORDER — IBRUTINIB 420 MG PO TABS: 420.0000 mg | ORAL_TABLET | Freq: Every day | ORAL | 11 refills | Status: DC

## 2018-04-12 NOTE — Progress Notes (Addendum)
[place] OFFICE PROGRESS NOTE  Patient Care Team: [name], MD as PCP - General (Family Medicine)  HEME/ONC OVERVIEW: 1. 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 Normocytic anemia 3. 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 Dalbert Batman to rule out Richter's transformation; currently scheduled on 04/16/2018  -Based on the presumed diagnosis of CLL, I discussed with the patient some of the benefits and risks of ibrutinib -The most frequent nonhematologic adverse events were diarrhea, fatigue, pyrexia, and nausea -Some of the short term side-effects included, though not limited to, risk of fatigue, weight loss, tumor lysis syndrome, risk of allergic reactions, pancytopenia, bruising, diarrhea, transient leukocytosis, life-threatening infections, need for transfusions of blood products, irregular heart beat, nausea, vomiting, change in bowel habits, admission to hospital for various reasons, and risks of death.  -The patient is aware that the response rates discussed earlier is not guaranteed.   -After a long discussion, patient  made an informed decision to proceed with the prescribed plan of care.  -I will go ahead and prescribe ibrutinib, but the patient understands not to start the medication until the LN biopsy result returns  -Given the bulky lymphadenopathy and mildly elevated uric acid, I will also start allopurinol 300mg  for TLS ppx  -Baseline ECG obtained in clinic today; normal  -Prior to starting ibrutinib, he will also require VZV and PJP ppx with acyclovir and Bactrim; we will prescribe these at the next visit prior to starting ibrutinib  -PRN supportive medications: loperamide, Zofran   Normocytic anemia -Hgb ~11 -Coombs negative, T bili normal, not suggestive of hemolysis -We will continue to monitor it for now   Thrombocytopenia -Plts ~70k -Patient denies any abnormal bleeding or bruising  -We will continue   Orders Placed This Encounter  Procedures  . CBC with Differential (Cancer Center Only)    Standing Status:   Future    Standing Expiration Date:   05/17/2019  . CMP ([place] only)    Standing Status:   Future    Standing Expiration Date:   05/17/2019  . Lactate dehydrogenase    Standing Status:   Future    Standing Expiration Date:   05/17/2019  . EKG 12-Lead   All questions were answered. The patient knows to call the clinic with any problems, questions or concerns. No barriers to learning was detected.  A total of more than 25 minutes were spent face-to-face with the patient during this encounter and over half of that time was spent on counseling and coordination of care as outlined above.   Return on 04/11/2018 to follow up labs, LN biopsy and to discuss ibrutinib start date.   [name], MD 04/12/2018 10:43 AM  CHIEF COMPLAINT: "I am  feeling fine"  INTERVAL HISTORY: David Whitehead returns to clinic for follow-up of market lymphocytosis.  He reports that he has been doing well overall, and denies any new symptoms, such as fever, chill, night sweats, weight change, he feels that  the lymph nodes in his neck may be slightly larger since last visit.  He is still working full-time.  SUMMARY OF ONCOLOGIC HISTORY:  No history exists.    REVIEW OF SYSTEMS:   Constitutional: ( - ) fevers, ( - )  chills , ( - ) night sweats Eyes: ( - ) blurriness of vision, ( - ) double vision, ( - ) watery eyes Ears, nose, mouth, throat, and face: ( - ) mucositis, ( - ) sore throat Respiratory: ( - ) cough, ( - ) dyspnea, ( - ) wheezes Cardiovascular: ( - ) palpitation, ( - ) chest discomfort, ( - ) lower extremity swelling Gastrointestinal:  ( - ) nausea, ( - ) heartburn, ( - ) change in bowel habits Skin: ( - ) abnormal skin rashes Lymphatics: ( - ) new lymphadenopathy, ( - ) easy bruising Neurological: ( - ) numbness, ( - ) tingling, ( - ) new weaknesses Behavioral/Psych: ( - ) mood change, ( - ) new changes  All other systems were reviewed with the patient and are negative.  I have reviewed the past medical history, past surgical history, social history and family history with the patient and they are unchanged from previous note.  ALLERGIES:  is allergic to amoxicillin.  MEDICATIONS:  Current Outpatient Medications  Medication Sig Dispense Refill  . allopurinol (ZYLOPRIM) 300 MG tablet Take 1 tablet (300 mg total) by mouth daily. 90 tablet 3   No current facility-administered medications for this visit.     PHYSICAL EXAMINATION: ECOG PERFORMANCE STATUS: 0 - Asymptomatic  Today's Vitals   04/12/18 0946  BP: 112/71  Pulse: 78  Resp: 18  Temp: 98.2 F (36.8 C)  TempSrc: Oral  SpO2: 100%  Weight: 209 lb 12.8 oz (95.2 kg)  Height: 5\' 10"  (1.778 m)  PainSc: 0-No pain   Body mass index is 30.1 kg/m.  Filed Weights   04/12/18 0946  Weight: 209 lb 12.8 oz (95.2 kg)    GENERAL: alert, no distress and comfortable SKIN: skin color, texture, turgor are normal, no rashes or significant lesions EYES: conjunctiva are pink and non-injected, sclera clear OROPHARYNX: no  exudate, no erythema; lips, buccal mucosa, and tongue normal  LYMPH:  Palpable multiple posterior occipital LN's (~1-2cm), bulky cervical and supraclavicular LN's bilaterally, bulky left axillary LN's  LUNGS: clear to auscultation and percussion with normal breathing effort HEART: regular rate & rhythm, no murmurs, no lower extremity edema ABDOMEN: soft, non-tender, non-distended, normal bowel sounds, no liver or spleen enlargement palpated  Musculoskeletal: no cyanosis of digits and no clubbing  PSYCH: alert & oriented x 3, fluent speech NEURO: no focal motor/sensory deficits  LABORATORY DATA:  I have reviewed the data as listed    Component Value Date/Time   NA 142 04/12/2018 0903   NA 142 03/31/2018 1649   K 4.3 04/12/2018 0903   CL 108 04/12/2018 0903   CO2 28 04/12/2018 0903   GLUCOSE 99 04/12/2018 0903   BUN 16 04/12/2018 0903   BUN 16 03/31/2018 1649   CREATININE 1.12 04/12/2018 0903   CALCIUM 8.9 04/12/2018 0903   PROT 5.6 (L) 04/12/2018 0903   PROT 5.9 (L) 03/31/2018 1649   ALBUMIN 4.3 04/12/2018 0903   ALBUMIN 4.6 03/31/2018  1649   AST 18 04/12/2018 0903   ALT 13 04/12/2018 0903   ALKPHOS 118 04/12/2018 0903   BILITOT 0.4 04/12/2018 0903   GFRNONAA >60 04/12/2018 0903   GFRAA >60 04/12/2018 0903    No results found for: SPEP, UPEP  Lab Results  Component Value Date   WBC 192.8 (HH) 04/12/2018   NEUTROABS 2.3 04/12/2018   HGB 10.3 (L) 04/12/2018   HCT 35.2 (L) 04/12/2018   MCV 89.3 04/12/2018   PLT 73 (L) 04/12/2018      Chemistry      Component Value Date/Time   NA 142 04/12/2018 0903   NA 142 03/31/2018 1649   K 4.3 04/12/2018 0903   CL 108 04/12/2018 0903   CO2 28 04/12/2018 0903   BUN 16 04/12/2018 0903   BUN 16 03/31/2018 1649   CREATININE 1.12 04/12/2018 0903      Component Value Date/Time   CALCIUM 8.9 04/12/2018 0903   ALKPHOS 118 04/12/2018 0903   AST 18 04/12/2018 0903   ALT 13 04/12/2018 0903   BILITOT 0.4 04/12/2018 0903        RADIOGRAPHIC STUDIES: I have personally reviewed the radiological images as listed below and agreed with the findings in the report. Dg Chest 2 View  Result Date: 03/31/2018 CLINICAL DATA:  Swollen lymph nodes for 1 week.  Denies chest pain. EXAM: CHEST - 2 VIEW COMPARISON:  None. FINDINGS: The heart size and mediastinal contours are within normal limits. Both lungs are clear. The visualized skeletal structures are unremarkable. IMPRESSION: No active cardiopulmonary disease. Electronically Signed   By: Kathreen Devoid   On: 03/31/2018 17:45   Ct Soft Tissue Neck W Contrast  Result Date: 04/07/2018 CLINICAL DATA:  Lymphadenopathy. EXAM: CT NECK WITH CONTRAST TECHNIQUE: Multidetector CT imaging of the neck was performed using the standard protocol following the bolus administration of intravenous contrast. CONTRAST:  172mL OMNIPAQUE IOHEXOL 300 MG/ML  SOLN COMPARISON:  None. FINDINGS: Pharynx and larynx: Moderate enlargement of the palatine tonsils and a relatively symmetric fashion without a discrete enhancing lesion identified. Mild symmetric prominence of the posterior nasopharyngeal soft tissues. Patent airway. Salivary glands: Unremarkable submandibular glands. Multiple enlarged intraparotid and periparotid lymph nodes bilaterally. Thyroid: Unremarkable. Lymph nodes: Bulky lymphadenopathy throughout all stations of the neck bilaterally. Reference lymph nodes measure 3.0 x 2.5 cm inferiorly in right level IIA (series 3, image 48), 2.8 x 2.2 cm in left level IIB (series 3, image 45), and 3.0 x 2.5 cm and 3.1 x 2.2 cm in the right and left supraclavicular regions, respectively (series 3, image 73). Vascular: Major vascular structures of the neck are patent. Limited intracranial: Unremarkable. Visualized orbits: Unremarkable. Mastoids and visualized paranasal sinuses: Mild right maxillary and ethmoid sinus mucosal thickening. Clear mastoid air cells. Skeleton: No acute osseous abnormality or suspicious  osseous lesion. Mild thoracic disc degeneration. Upper chest: Reported separately. Other: None. IMPRESSION: Bulky bilateral cervical lymphadenopathy consistent with lymphoproliferative disease/lymphoma. Electronically Signed   By: Logan Bores M.D.   On: 04/07/2018 14:26   Ct Chest W Contrast  Result Date: 04/08/2018 CLINICAL DATA:  Swollen lymph nodes, elevated WBCs. Unintentional weight loss. Workup for possible CLL. EXAM: CT CHEST, ABDOMEN, AND PELVIS WITH CONTRAST TECHNIQUE: Multidetector CT imaging of the chest, abdomen and pelvis was performed following the standard protocol during bolus administration of intravenous contrast. CONTRAST:  144mL OMNIPAQUE IOHEXOL 300 MG/ML  SOLN COMPARISON:  None. FINDINGS: CT CHEST FINDINGS Cardiovascular: Heart size is normal. No pericardial effusion. No thoracic  aortic aneurysm. No central obstructing pulmonary embolism. Mediastinum/Nodes: Bulky lymphadenopathy throughout the mediastinum and perihilar regions, largest measurable lymph node in the RIGHT hilum measuring 2.6 cm short axis dimension. Esophagus is unremarkable. Trachea and central bronchi are unremarkable. Lungs/Pleura: 9 mm pulmonary nodule within the anterior inferior portion of the RIGHT upper lobe (series 8, image 58). Subtle low-density nodule within the LEFT lower lobe measures 6 mm (series 8, image 75). Musculoskeletal: No acute or suspicious osseous finding. CT ABDOMEN PELVIS FINDINGS Hepatobiliary: No focal liver abnormality is seen. No gallstones, gallbladder wall thickening, or biliary dilatation. Pancreas: Unremarkable. No pancreatic ductal dilatation or surrounding inflammatory changes. Spleen: Marked splenomegaly. Adrenals/Urinary Tract: Adrenal glands appear normal. Kidneys are unremarkable without mass, stone or hydronephrosis. Bladder appears normal. Stomach/Bowel: No dilated large or small bowel loops. No evidence of bowel wall inflammation. Appendix is normal. Stomach is unremarkable,  decompressed. Vascular/Lymphatic: No vascular abnormality. Bulky lymphadenopathy throughout the retroperitoneum, largest measurable lymph node in the LEFT periaortic retroperitoneum measuring 1.8 cm short axis dimension (series 3, image 76). Bulky adenopathy within the bilateral iliac chain regions, including markedly enlarged lymph nodes just proximal to the inguinal canals bilaterally, on the LEFT measuring 6.3 x 3.5 cm. Additional bulky lymphadenopathy within the bilateral inguinal regions. Bulky lymphadenopathy throughout the mesentery, peripancreatic, portacaval and porta hepatis regions. Reproductive: Prostate is unremarkable. Other: No free fluid or abscess collection. No free intraperitoneal air. Musculoskeletal: No acute or suspicious osseous finding. Mild degenerative spondylosis of the lumbar spine. IMPRESSION: 1. Bulky lymphadenopathy throughout the chest, abdomen and pelvis, as detailed above, highly suspicious for lymphoma. 2. Marked splenomegaly. 3. 9 mm pulmonary nodule within the anterior inferior portion of the RIGHT upper lobe. Subtle low-density nodule within the LEFT lower lobe. These are too small to definitively characterize. Neoplastic pulmonary nodules cannot be excluded. Consider PET-CT for further characterization. Electronically Signed   By: Franki Cabot M.D.   On: 04/08/2018 08:25   Ct Abdomen Pelvis W Contrast  Result Date: 04/08/2018 CLINICAL DATA:  Swollen lymph nodes, elevated WBCs. Unintentional weight loss. Workup for possible CLL. EXAM: CT CHEST, ABDOMEN, AND PELVIS WITH CONTRAST TECHNIQUE: Multidetector CT imaging of the chest, abdomen and pelvis was performed following the standard protocol during bolus administration of intravenous contrast. CONTRAST:  145mL OMNIPAQUE IOHEXOL 300 MG/ML  SOLN COMPARISON:  None. FINDINGS: CT CHEST FINDINGS Cardiovascular: Heart size is normal. No pericardial effusion. No thoracic aortic aneurysm. No central obstructing pulmonary embolism.  Mediastinum/Nodes: Bulky lymphadenopathy throughout the mediastinum and perihilar regions, largest measurable lymph node in the RIGHT hilum measuring 2.6 cm short axis dimension. Esophagus is unremarkable. Trachea and central bronchi are unremarkable. Lungs/Pleura: 9 mm pulmonary nodule within the anterior inferior portion of the RIGHT upper lobe (series 8, image 58). Subtle low-density nodule within the LEFT lower lobe measures 6 mm (series 8, image 75). Musculoskeletal: No acute or suspicious osseous finding. CT ABDOMEN PELVIS FINDINGS Hepatobiliary: No focal liver abnormality is seen. No gallstones, gallbladder wall thickening, or biliary dilatation. Pancreas: Unremarkable. No pancreatic ductal dilatation or surrounding inflammatory changes. Spleen: Marked splenomegaly. Adrenals/Urinary Tract: Adrenal glands appear normal. Kidneys are unremarkable without mass, stone or hydronephrosis. Bladder appears normal. Stomach/Bowel: No dilated large or small bowel loops. No evidence of bowel wall inflammation. Appendix is normal. Stomach is unremarkable, decompressed. Vascular/Lymphatic: No vascular abnormality. Bulky lymphadenopathy throughout the retroperitoneum, largest measurable lymph node in the LEFT periaortic retroperitoneum measuring 1.8 cm short axis dimension (series 3, image 76). Bulky adenopathy within the bilateral iliac chain regions, including  markedly enlarged lymph nodes just proximal to the inguinal canals bilaterally, on the LEFT measuring 6.3 x 3.5 cm. Additional bulky lymphadenopathy within the bilateral inguinal regions. Bulky lymphadenopathy throughout the mesentery, peripancreatic, portacaval and porta hepatis regions. Reproductive: Prostate is unremarkable. Other: No free fluid or abscess collection. No free intraperitoneal air. Musculoskeletal: No acute or suspicious osseous finding. Mild degenerative spondylosis of the lumbar spine. IMPRESSION: 1. Bulky lymphadenopathy throughout the chest,  abdomen and pelvis, as detailed above, highly suspicious for lymphoma. 2. Marked splenomegaly. 3. 9 mm pulmonary nodule within the anterior inferior portion of the RIGHT upper lobe. Subtle low-density nodule within the LEFT lower lobe. These are too small to definitively characterize. Neoplastic pulmonary nodules cannot be excluded. Consider PET-CT for further characterization. Electronically Signed   By: Franki Cabot M.D.   On: 04/08/2018 08:25

## 2018-04-12 NOTE — Telephone Encounter (Signed)
Oral Oncology Patient Advocate Encounter  Prior Authorization for Kate Sable has been approved.    PA# [id number]  Effective dates: 04/12/18 through 04/11/19  Patients co-pay is $4076 for #14 tablets.  Insurance limits new therapy to 14 day supplies for 1-3 months.  I have obtained a copay card to help reduce his copay to $10.  Oral Oncology Clinic will continue to follow.   [place] Patient [place] Phone [phone] Fax [phone] 04/12/2018 12:56 PM

## 2018-04-12 NOTE — Telephone Encounter (Signed)
Oral Oncology Patient Advocate Encounter  Received notification from [facility] that prior authorization for Imbruvica is required.  PA submitted on CoverMyMeds Key [id number]  Status is pending  Oral Oncology Clinic will continue to follow.  [place] Patient [place] Phone [phone] Fax [phone] 04/12/2018 12:55 PM

## 2018-04-12 NOTE — Telephone Encounter (Signed)
Oral Oncology Pharmacist Encounter  Received new prescription for Imbruvica (ibrutinib) for the treatment of CLL, planned duration until disease progression or unacceptable drug toxicity.  CBC/CMP from 04/12/18 assessed, no relevant lab abnormalities. BP from 04/12/18 reviewed, BP well controlled. Prescription dose and frequency assessed.   Current medication list in Epic reviewed, no DDIs with ibrutinib identified.  Prescription has been e-scribed to the [facility] for benefits analysis and approval.  Oral Oncology Clinic will continue to follow for insurance authorization, copayment issues, initial counseling and start date.  Darl Pikes, PharmD, BCPS, [facility] Hematology/Oncology Clinical Pharmacist [facility] [place] [facility] [phone]  04/12/2018 11:12 AM

## 2018-04-12 NOTE — Addendum Note (Signed)
Addended by: [name] on: 04/12/2018 10:49 AM   Modules accepted: Orders

## 2018-04-12 NOTE — Telephone Encounter (Signed)
Oral Oncology Patient Advocate Encounter   Was successful in obtaining a copay card for Lamar.  This copay card will make the patients copay $10.  I have spoken with the patient.    The billing information is as follows and has been shared with [place].   RxBin: [id number] Member ID: [id number] Group ID: [id number]  [place] Nancy [place] Patient [place] Phone [phone] Fax [phone] 04/12/2018 1:58 PM

## 2018-04-13 LAB — HEPATITIS B SURFACE ANTIBODY,QUALITATIVE: Hep B S Ab: NONREACTIVE

## 2018-04-13 NOTE — Anesthesia Preprocedure Evaluation (Addendum)
Anesthesia Evaluation  Patient identified by MRN, date of birth, ID band Patient awake    Reviewed: Allergy & Precautions, NPO status , Patient's Chart, lab work & pertinent test results  Airway Mallampati: II  TM Distance: >3 FB Neck ROM: Full    Dental  (+) Teeth Intact, Dental Advisory Given   Pulmonary sleep apnea and Continuous Positive Airway Pressure Ventilation ,    Pulmonary exam normal breath sounds clear to auscultation       Cardiovascular Exercise Tolerance: Good negative cardio ROS Normal cardiovascular exam Rhythm:Regular Rate:Normal     Neuro/Psych negative neurological ROS  negative psych ROS   GI/Hepatic negative GI ROS, Neg liver ROS,   Endo/Other  Obesity   Renal/GU negative Renal ROS     Musculoskeletal negative musculoskeletal ROS (+)   Abdominal   Peds  Hematology  (+) Blood dyscrasia (Thrombocytopenia), anemia , Lymphadenopathy    Anesthesia Other Findings Day of surgery medications reviewed with the patient.  Reproductive/Obstetrics                            Anesthesia Physical Anesthesia Plan  ASA: II  Anesthesia Plan: General   Post-op Pain Management:    Induction: Intravenous  PONV Risk Score and Plan: 2 and Ondansetron, Dexamethasone and Midazolam  Airway Management Planned: LMA  Additional Equipment:   Intra-op Plan:   Post-operative Plan: Extubation in OR  Informed Consent: I have reviewed the patients History and Physical, chart, labs and discussed the procedure including the risks, benefits and alternatives for the proposed anesthesia with the patient or authorized representative who has indicated his/her understanding and acceptance.   Dental advisory given  Plan Discussed with: CRNA  Anesthesia Plan Comments: (PAT note written 04/13/2018 by Myra Gianotti, PA-C. )       Anesthesia Quick Evaluation

## 2018-04-13 NOTE — Progress Notes (Signed)
Anesthesia Chart Review: David Whitehead  Case:  [id number] Date/Time:  04/16/18 1315   Procedure:  AXILLARY LYMPH NODE DEEP EXCISION (N/A )   Anesthesia type:  General   Pre-op diagnosis:  LYMPHADENOPATHY   Location:  [place] OR ROOM 02 / [place]   Surgeon:  Fanny Skates, MD      DISCUSSION: Patient is a 47 year old male scheduled for the above procedure. He was seen by his PCP [name], MD on 03/31/18 for swollen lymph nodes and CBC came back showing significantly elevated WBC of 233K with mild thrombocytopenia. Patient was referred to oncology and he was seen on 04/05/18 by [name], MD. Additional labs (PB flow) suggestive of CLL (less likely MCL). Patient was referred to general surgery for excisional LN biopsy for confirmatory diagnosis.   Patient's other history includes mild obesity (by BMI) and mild OSA by 2008 sleep study. Patient has had recent labs and EKG at [facility], so should be okay to complete pre-operative RN assessment over the phone and anesthesiologist to evaluate on the day of surgery. Attempting to get 04/12/18 EKG from [facility]. Discussed with anesthesiologist [name], MD. Patient's CBC is abnormal (WBC 192K, H/H 10/35, PLT 73), but stable, so no further preoperative labs planned at this time.   VS: Vitals on 04/12/18 at [facility]: BP 112/71, HR 78, RR 18, T 98.72F, O2 sat 100%. BMI 30.   PROVIDERS: [name], MD is PCP [name], MD is HEM-ONC. Last visit 04/12/18. If biopsy confirmed CLL then ibrutinib will be started.    LABS: Labs on 04/12/18 included: Lab Results  Component Value Date   WBC 192.8 (HH) 04/12/2018   HGB 10.3 (L) 04/12/2018   HCT 35.2 (L) 04/12/2018   PLT 73 (L) 04/12/2018   GLUCOSE 99 04/12/2018   ALT 13 04/12/2018   AST 18 04/12/2018   NA 142 04/12/2018   K 4.3 04/12/2018   CL 108 04/12/2018   CREATININE 1.12 04/12/2018   BUN 16 04/12/2018   CO2 28 04/12/2018   CBC Latest Ref Rng & Units 04/12/2018 04/05/2018 03/31/2018  WBC  4.0 - 10.5 K/uL 192.8(HH) 221.7(HH) 233.9(HH)  Hemoglobin 13.0 - 17.0 g/dL 10.3(L) 10.8(L) 10.7(L)  Hematocrit 39.0 - 52.0 % 35.2(L) 36.2(L) 35.2(L)  Platelets 150 - 400 K/uL 73(L) 76(L) 76(LL)    IMAGES: CT soft tissue neck/chest/abd/pelvis 04/07/18: IMPRESSION: 1. Bulky lymphadenopathy throughout the chest, abdomen and pelvis, as detailed above, highly suspicious for lymphoma. 2. Marked splenomegaly. 3. 9 mm pulmonary nodule within the anterior inferior portion of the RIGHT upper lobe. Subtle low-density nodule within the LEFT lower lobe. These are too small to definitively characterize. Neoplastic pulmonary nodules cannot be excluded. Consider PET-CT for further characterization.  CXR 03/31/18: IMPRESSION: No active cardiopulmonary disease.   SLEEP STUDY 02/26/07:  IMPRESSIONS-RECOMMENDATIONS:  Mild obstructive sleep apnea/hypopnea  syndrome with an apnea/hypopnea index of 9 events per hour and oxygen  desaturation as low as 85%.  Treatment for this degree of sleep apnea  can include weight loss alone if applicable, upper airway surgery, oral  appliance, and also CPAP.  The decision to treat this degree of sleep  apnea should be based on how much this is impacting the patient's  quality of  life and also how the treatment options may effect his lifestyle.  This  mild degree of sleep apnea represents very little cardiovascular risk.   EKG: 04/12/18: Requested for [facility]. Per Dr. Maylon Peppers: "Baseline ECG obtained in clinic today;  normal"   CV: N/A   History as outlined in DISCUSSION and will be updated as needed during PAT pre-operative phone call.   No past medical history on file.  No past surgical history on file.  MEDICATIONS: No current facility-administered medications for this encounter.    Marland Kitchen allopurinol (ZYLOPRIM) 300 MG tablet  . Ibrutinib 420 MG TABS  Ibrutinib will not be started until diagnosis confirmed with LN biopsy.    George Hugh [facility]  Short Stay Center/Anesthesiology Phone [phone] 04/13/2018 4:04 PM

## 2018-04-14 NOTE — Telephone Encounter (Signed)
Pending biopsy results before calling patient.

## 2018-04-15 ENCOUNTER — Other Ambulatory Visit: Payer: Self-pay

## 2018-04-15 ENCOUNTER — Encounter (HOSPITAL_COMMUNITY): Payer: Self-pay | Admitting: *Deleted

## 2018-04-15 NOTE — Progress Notes (Signed)
Spoke with pt for pre-op call. Pt denies cardiac history, HTN or diabetes.  

## 2018-04-16 ENCOUNTER — Other Ambulatory Visit: Payer: Self-pay

## 2018-04-16 ENCOUNTER — Ambulatory Visit (HOSPITAL_COMMUNITY)
Admission: RE | Admit: 2018-04-16 | Discharge: 2018-04-16 | Disposition: A | Discharge status: Home / Self Care | Payer: BLUE CROSS/BLUE SHIELD | Source: Ambulatory Visit | Attending: General Surgery | Admitting: General Surgery

## 2018-04-16 ENCOUNTER — Encounter (HOSPITAL_COMMUNITY)
Admission: RE | Disposition: A | Discharge status: Home / Self Care | Payer: Self-pay | Source: Ambulatory Visit | Attending: General Surgery

## 2018-04-16 ENCOUNTER — Ambulatory Visit (HOSPITAL_COMMUNITY): Payer: BLUE CROSS/BLUE SHIELD | Admitting: Vascular Surgery

## 2018-04-16 ENCOUNTER — Encounter (HOSPITAL_COMMUNITY): Payer: Self-pay | Admitting: *Deleted

## 2018-04-16 DIAGNOSIS — G4730 Sleep apnea, unspecified: Secondary | ICD-10-CM | POA: Insufficient documentation

## 2018-04-16 DIAGNOSIS — D649 Anemia, unspecified: Secondary | ICD-10-CM | POA: Diagnosis not present

## 2018-04-16 DIAGNOSIS — D72820 Lymphocytosis (symptomatic): Secondary | ICD-10-CM

## 2018-04-16 DIAGNOSIS — Z8042 Family history of malignant neoplasm of prostate: Secondary | ICD-10-CM | POA: Insufficient documentation

## 2018-04-16 DIAGNOSIS — Z836 Family history of other diseases of the respiratory system: Secondary | ICD-10-CM | POA: Diagnosis not present

## 2018-04-16 DIAGNOSIS — M549 Dorsalgia, unspecified: Secondary | ICD-10-CM | POA: Insufficient documentation

## 2018-04-16 DIAGNOSIS — C9110 Chronic lymphocytic leukemia of B-cell type not having achieved remission: Secondary | ICD-10-CM | POA: Insufficient documentation

## 2018-04-16 DIAGNOSIS — Z880 Allergy status to penicillin: Secondary | ICD-10-CM | POA: Insufficient documentation

## 2018-04-16 DIAGNOSIS — Z800 Family history of malignant neoplasm of digestive organs: Secondary | ICD-10-CM | POA: Diagnosis not present

## 2018-04-16 DIAGNOSIS — Z823 Family history of stroke: Secondary | ICD-10-CM | POA: Diagnosis not present

## 2018-04-16 DIAGNOSIS — D696 Thrombocytopenia, unspecified: Secondary | ICD-10-CM | POA: Insufficient documentation

## 2018-04-16 DIAGNOSIS — Z8261 Family history of arthritis: Secondary | ICD-10-CM | POA: Insufficient documentation

## 2018-04-16 DIAGNOSIS — C91Z0 Other lymphoid leukemia not having achieved remission: Secondary | ICD-10-CM | POA: Diagnosis not present

## 2018-04-16 DIAGNOSIS — R591 Generalized enlarged lymph nodes: Secondary | ICD-10-CM | POA: Diagnosis not present

## 2018-04-16 DIAGNOSIS — Z820 Family history of epilepsy and other diseases of the nervous system: Secondary | ICD-10-CM | POA: Insufficient documentation

## 2018-04-16 DIAGNOSIS — C8594 Non-Hodgkin lymphoma, unspecified, lymph nodes of axilla and upper limb: Secondary | ICD-10-CM | POA: Diagnosis not present

## 2018-04-16 DIAGNOSIS — Z8249 Family history of ischemic heart disease and other diseases of the circulatory system: Secondary | ICD-10-CM | POA: Diagnosis not present

## 2018-04-16 DIAGNOSIS — R590 Localized enlarged lymph nodes: Secondary | ICD-10-CM | POA: Diagnosis not present

## 2018-04-16 HISTORY — DX: Sleep apnea, unspecified: G47.30

## 2018-04-16 HISTORY — PX: AXILLARY LYMPH NODE DISSECTION: SHX5229

## 2018-04-16 LAB — PROTIME-INR
INR: 1.03
Prothrombin Time: 13.4 seconds (ref 11.4–15.2)

## 2018-04-16 LAB — APTT: aPTT: 27 seconds (ref 24–36)

## 2018-04-16 SURGERY — LYMPHADENECTOMY, AXILLARY
Anesthesia: General | Site: Axilla | Laterality: Right

## 2018-04-16 MED ORDER — GABAPENTIN 300 MG PO CAPS
300.0000 mg | ORAL_CAPSULE | ORAL | Status: AC
  Administered 2018-04-16: 300 mg via ORAL
  Filled 2018-04-16: qty 1

## 2018-04-16 MED ORDER — PROPOFOL 10 MG/ML IV BOLUS
INTRAVENOUS | Status: DC | PRN
  Administered 2018-04-16: 200 mg via INTRAVENOUS

## 2018-04-16 MED ORDER — BUPIVACAINE-EPINEPHRINE 0.5% -1:200000 IJ SOLN
INTRAMUSCULAR | Status: DC | PRN
  Administered 2018-04-16: 10 mL

## 2018-04-16 MED ORDER — LIDOCAINE 2% (20 MG/ML) 5 ML SYRINGE
INTRAMUSCULAR | Status: AC
  Filled 2018-04-16: qty 5

## 2018-04-16 MED ORDER — BUPIVACAINE-EPINEPHRINE (PF) 0.25% -1:200000 IJ SOLN
INTRAMUSCULAR | Status: AC
  Filled 2018-04-16: qty 30

## 2018-04-16 MED ORDER — LIDOCAINE HCL (CARDIAC) PF 100 MG/5ML IV SOSY
PREFILLED_SYRINGE | INTRAVENOUS | Status: DC | PRN
  Administered 2018-04-16: 100 mg via INTRAVENOUS

## 2018-04-16 MED ORDER — DEXAMETHASONE SODIUM PHOSPHATE 10 MG/ML IJ SOLN
INTRAMUSCULAR | Status: AC
  Filled 2018-04-16: qty 1

## 2018-04-16 MED ORDER — CEFAZOLIN SODIUM-DEXTROSE 2-4 GM/100ML-% IV SOLN
2.0000 g | INTRAVENOUS | Status: AC
  Administered 2018-04-16: 2 g via INTRAVENOUS

## 2018-04-16 MED ORDER — ONDANSETRON HCL 4 MG/2ML IJ SOLN
INTRAMUSCULAR | Status: DC | PRN
  Administered 2018-04-16: 4 mg via INTRAVENOUS

## 2018-04-16 MED ORDER — CELECOXIB 200 MG PO CAPS
200.0000 mg | ORAL_CAPSULE | ORAL | Status: AC
  Administered 2018-04-16: 200 mg via ORAL
  Filled 2018-04-16: qty 1

## 2018-04-16 MED ORDER — ONDANSETRON HCL 4 MG/2ML IJ SOLN
INTRAMUSCULAR | Status: AC
  Filled 2018-04-16: qty 2

## 2018-04-16 MED ORDER — CHLORHEXIDINE GLUCONATE CLOTH 2 % EX PADS: 6.0000 | MEDICATED_PAD | Freq: Once | CUTANEOUS | Status: DC

## 2018-04-16 MED ORDER — FENTANYL CITRATE (PF) 250 MCG/5ML IJ SOLN
INTRAMUSCULAR | Status: DC | PRN
  Administered 2018-04-16 (×2): 50 ug via INTRAVENOUS

## 2018-04-16 MED ORDER — MIDAZOLAM HCL 2 MG/2ML IJ SOLN
INTRAMUSCULAR | Status: AC
  Filled 2018-04-16: qty 2

## 2018-04-16 MED ORDER — FENTANYL CITRATE (PF) 250 MCG/5ML IJ SOLN
INTRAMUSCULAR | Status: AC
  Filled 2018-04-16: qty 5

## 2018-04-16 MED ORDER — FENTANYL CITRATE (PF) 100 MCG/2ML IJ SOLN
INTRAMUSCULAR | Status: AC
  Filled 2018-04-16: qty 2

## 2018-04-16 MED ORDER — CEFAZOLIN SODIUM-DEXTROSE 2-4 GM/100ML-% IV SOLN
INTRAVENOUS | Status: AC
  Filled 2018-04-16: qty 100

## 2018-04-16 MED ORDER — LACTATED RINGERS IV SOLN
INTRAVENOUS | Status: DC | PRN
  Administered 2018-04-16 (×2): via INTRAVENOUS

## 2018-04-16 MED ORDER — DEXAMETHASONE SODIUM PHOSPHATE 10 MG/ML IJ SOLN
INTRAMUSCULAR | Status: DC | PRN
  Administered 2018-04-16: 10 mg via INTRAVENOUS

## 2018-04-16 MED ORDER — MIDAZOLAM HCL 5 MG/5ML IJ SOLN
INTRAMUSCULAR | Status: DC | PRN
  Administered 2018-04-16: 2 mg via INTRAVENOUS

## 2018-04-16 MED ORDER — 0.9 % SODIUM CHLORIDE (POUR BTL) OPTIME
TOPICAL | Status: DC | PRN
  Administered 2018-04-16: 1000 mL

## 2018-04-16 MED ORDER — LACTATED RINGERS IV SOLN
Freq: Once | INTRAVENOUS | Status: AC
  Administered 2018-04-16: 09:00:00 via INTRAVENOUS

## 2018-04-16 MED ORDER — ACETAMINOPHEN 500 MG PO TABS
1000.0000 mg | ORAL_TABLET | ORAL | Status: AC
  Administered 2018-04-16: 1000 mg via ORAL
  Filled 2018-04-16: qty 2

## 2018-04-16 MED ORDER — HYDROCODONE-ACETAMINOPHEN 5-325 MG PO TABS: 1.0000 | ORAL_TABLET | Freq: Four times a day (QID) | ORAL | 0 refills | Status: DC | PRN

## 2018-04-16 SURGICAL SUPPLY — 45 items
APPLIER CLIP 9.375 MED OPEN (MISCELLANEOUS) ×3
BLADE CLIPPER SURG (BLADE) ×3 IMPLANT
BLADE SURG 15 STRL LF DISP TIS (BLADE) ×1 IMPLANT
BLADE SURG 15 STRL SS (BLADE) ×2
CANISTER SUCT 3000ML PPV (MISCELLANEOUS) ×3 IMPLANT
CHLORAPREP W/TINT 26ML (MISCELLANEOUS) ×3 IMPLANT
CLIP APPLIE 9.375 MED OPEN (MISCELLANEOUS) ×1 IMPLANT
COVER SURGICAL LIGHT HANDLE (MISCELLANEOUS) ×3 IMPLANT
COVER WAND RF STERILE (DRAPES) IMPLANT
DERMABOND ADVANCED (GAUZE/BANDAGES/DRESSINGS) ×2
DERMABOND ADVANCED .7 DNX12 (GAUZE/BANDAGES/DRESSINGS) ×1 IMPLANT
DRAIN CHANNEL 19F RND (DRAIN) IMPLANT
DRAPE CHEST BREAST 15X10 FENES (DRAPES) ×3 IMPLANT
DRAPE UTILITY XL STRL (DRAPES) ×3 IMPLANT
ELECT CAUTERY BLADE 6.4 (BLADE) ×3 IMPLANT
ELECT REM PT RETURN 9FT ADLT (ELECTROSURGICAL) ×3
ELECTRODE REM PT RTRN 9FT ADLT (ELECTROSURGICAL) ×1 IMPLANT
EVACUATOR SILICONE 100CC (DRAIN) IMPLANT
GAUZE SPONGE 4X4 12PLY STRL (GAUZE/BANDAGES/DRESSINGS) ×3 IMPLANT
GLOVE EUDERMIC 7 POWDERFREE (GLOVE) ×3 IMPLANT
GOWN STRL REUS W/ TWL LRG LVL3 (GOWN DISPOSABLE) ×3 IMPLANT
GOWN STRL REUS W/ TWL XL LVL3 (GOWN DISPOSABLE) ×1 IMPLANT
GOWN STRL REUS W/TWL LRG LVL3 (GOWN DISPOSABLE) ×9
GOWN STRL REUS W/TWL XL LVL3 (GOWN DISPOSABLE) ×2
KIT BASIN OR (CUSTOM PROCEDURE TRAY) ×3 IMPLANT
KIT TURNOVER KIT B (KITS) ×3 IMPLANT
NEEDLE HYPO 25GX1X1/2 BEV (NEEDLE) ×3 IMPLANT
NS IRRIG 1000ML POUR BTL (IV SOLUTION) ×3 IMPLANT
PACK SURGICAL SETUP 50X90 (CUSTOM PROCEDURE TRAY) ×3 IMPLANT
PAD ARMBOARD 7.5X6 YLW CONV (MISCELLANEOUS) ×3 IMPLANT
PENCIL BUTTON HOLSTER BLD 10FT (ELECTRODE) ×3 IMPLANT
SPECIMEN JAR MEDIUM (MISCELLANEOUS) ×3 IMPLANT
SPONGE LAP 4X18 RFD (DISPOSABLE) ×6 IMPLANT
SUT ETHILON 2 0 FS 18 (SUTURE) ×3 IMPLANT
SUT MNCRL AB 4-0 PS2 18 (SUTURE) ×3 IMPLANT
SUT VIC AB 3-0 SH 27 (SUTURE) ×2
SUT VIC AB 3-0 SH 27XBRD (SUTURE) ×1 IMPLANT
SUT VICRYL AB 3 0 TIES (SUTURE) ×3 IMPLANT
SYR BULB IRRIGATION 50ML (SYRINGE) ×3 IMPLANT
SYR CONTROL 10ML LL (SYRINGE) ×3 IMPLANT
TAPE CLOTH SURG 6X10 WHT LF (GAUZE/BANDAGES/DRESSINGS) ×3 IMPLANT
TOWEL OR 17X24 6PK STRL BLUE (TOWEL DISPOSABLE) ×3 IMPLANT
TUBE CONNECTING 12'X1/4 (SUCTIONS) ×1
TUBE CONNECTING 12X1/4 (SUCTIONS) ×2 IMPLANT
YANKAUER SUCT BULB TIP NO VENT (SUCTIONS) ×3 IMPLANT

## 2018-04-16 NOTE — Transfer of Care (Signed)
Immediate Anesthesia Transfer of Care Note  [name]  Procedure(s) Performed: AXILLARY LYMPH NODE DEEP EXCISION (Right Axilla)  Patient Location: PACU  Anesthesia Type:General  Level of Consciousness: awake, alert  and oriented  Airway & Oxygen Therapy: Patient Spontanous Breathing and Patient connected to face mask oxygen  Post-op Assessment: Report given to RN, Post -op Vital signs reviewed and stable and Patient moving all extremities X 4  Post vital signs: Reviewed and stable  Last Vitals:  Vitals Value Taken Time  BP 122/74 04/16/2018 12:28 PM  Temp 36.9 C 04/16/2018 12:26 PM  Pulse 94 04/16/2018 12:30 PM  Resp 17 04/16/2018 12:30 PM  SpO2 94 % 04/16/2018 12:30 PM  Vitals shown include unvalidated device data.  Last Pain:  Vitals:   04/16/18 0901  TempSrc:   PainSc: 0-No pain      Patients Stated Pain Goal: 3 (10/62/69 4854)  Complications: No apparent anesthesia complications

## 2018-04-16 NOTE — Anesthesia Postprocedure Evaluation (Signed)
Anesthesia Post Note  [name]  Procedure(s) Performed: AXILLARY LYMPH NODE DEEP EXCISION (Right Axilla)     Patient location during evaluation: PACU Anesthesia Type: General Level of consciousness: awake and alert, awake and oriented Pain management: pain level controlled Vital Signs Assessment: post-procedure vital signs reviewed and stable Respiratory status: spontaneous breathing, nonlabored ventilation and respiratory function stable Cardiovascular status: blood pressure returned to baseline and stable Postop Assessment: no apparent nausea or vomiting Anesthetic complications: no    Last Vitals:  Vitals:   04/16/18 1300 04/16/18 1346  BP: 122/80 119/74  Pulse: 85 87  Resp: 20 13  Temp:  36.9 C  SpO2: 92% 98%    Last Pain:  Vitals:   04/16/18 1303  TempSrc:   PainSc: 0-No pain                 Catalina Gravel

## 2018-04-16 NOTE — Interval H&P Note (Signed)
History and Physical Interval Note:  04/16/2018 8:12 AM  David Whitehead  has presented today for surgery, with the diagnosis of LYMPHADENOPATHY  The various methods of treatment have been discussed with the patient and family. After consideration of risks, benefits and other options for treatment, the patient has consented to  Procedure(s): AXILLARY LYMPH NODE DEEP EXCISION (N/A) as a surgical intervention .  The patient's history has been reviewed, patient examined, no change in status, stable for surgery.  I have reviewed the patient's chart and labs.  Questions were answered to the patient's satisfaction.     [name]

## 2018-04-16 NOTE — Op Note (Signed)
Patient Name:           Esther Broyles   Date of Surgery:        04/16/2018  Pre op Diagnosis:      Lymphadenopathy  Post op Diagnosis:    Lymphadenopathy  Procedure:                 Excision deep right axillary lymph node  Surgeon:                     Edsel Petrin. Dalbert Batman, M.D., FACS  Assistant:                      OR staff  Operative Indications:   The patient is a 47 year old male who presents with lymphadenopathy. He is referred by Dr. Maylon Peppers of the medical oncology group for consideration of lymph node biopsy to clarify his diagnosis. [name] is his PCP.  He is brought to the operating room for right axillary deep lymph node biopsy     He is generally healthy. He has noticed some occipital lumps since June. No fevers or night sweats. No pain. He's lost 50 pounds in the last 11 months but that was intentional that his weight is actually been stable for a couple of months. More recently he's noticed lumps on both sides of his neck, under his arms and maybe a little bit in his groins. No pulmonary or GI symptoms. CBC showed profound leukocytosis and a platelet count of 76,000. CT scans show bulky lymphadenopathy throughout the chest abdomen and pelvis highly suspicious for lymphoma.  Splenomegaly is noted.       On exam he has enlarged lymph nodes in bilateral neck, bilateral axilla, and some smaller nodes in the groin. He also has splenomegaly I discussed the indications, details, techniques, and numerous risk of the surgery with him. He is aware the risks of bleeding, infection, arm numbness, arm swelling, fluid collections and other & problems. He understands all these issues well. All his questions are answered. He agrees with this plan   Operative Findings:       The right axillary lymph node biopsy was performed to a short incision at the right axillary hairline.  There were 2 or 3 lymph nodes that were matted together that were removed.  Lymphatic and vascular channels  were clamped divided and ligated with 3-0 Vicryl ties.  Hemostasis was excellent.  Specimen was sent fresh to the lab for lymphoma work-up  Procedure in Detail:          Following the induction of general LMA anesthesia the patient's right chest and axilla were prepped and draped in a sterile fashion.  Intravenous antibiotics were given.  Surgical timeout was performed.  0.25% Marcaine with epinephrine was used as a local infiltration anesthetic.     A transverse incision was made in the right axilla just at the hairline.  Dissection was carried down through the clavipectoral fascia.  I immediately encountered smooth discolored mobile lymph nodes.  I dissected out an area where there were 2 or 3 of these matted together.  Vascular and lymphatic channels were clamped divided and ligated with 3-0 Vicryl ties and the specimen was sent intact and fresh to the lab.  Hemostasis was excellent.  The wound was irrigated.  The clavipectoral fascia was closed with 3-0 Vicryl sutures and the skin closed with a running subcuticular 4-0 Monocryl and Dermabond.  Icepack was placed and  the patient taken to PACU in stable condition.  EBL 20 cc or less.  Counts correct.  Complications none.   Addendum: I logged onto the [employer] and reviewed his prescription medication history     Aniylah Avans M. Dalbert Batman, M.D., FACS General and Minimally Invasive Surgery Breast and Colorectal Surgery  04/16/2018 12:24 PM

## 2018-04-16 NOTE — Discharge Instructions (Signed)
Ice pack to wound, 10 minutes at a time, intermittently for 24 hours  Okay to shower starting tomorrow evening No tub baths or hot tubs for 3 weeks  For pain take Tylenol 1000 mg every 6 hours for a day or 2 We will call in a prescription for something stronger to your pharmacy if you need  You may drive your car in 3 or 4 days when you are comfortable and not taking any pain medicine  Be sure to move your shoulder around frequently to avoid stiffness

## 2018-04-17 ENCOUNTER — Encounter (HOSPITAL_COMMUNITY): Payer: Self-pay | Admitting: General Surgery

## 2018-04-19 ENCOUNTER — Encounter: Payer: Self-pay | Admitting: *Deleted

## 2018-04-20 ENCOUNTER — Encounter: Payer: Self-pay | Admitting: Hematology & Oncology

## 2018-04-20 ENCOUNTER — Telehealth: Payer: Self-pay | Admitting: Pharmacist

## 2018-04-20 NOTE — Telephone Encounter (Addendum)
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  We will proceed when he is ready to start.

## 2018-04-20 NOTE — Progress Notes (Signed)
[place] OFFICE PROGRESS NOTE  Patient Care Team: [name], MD as PCP - General (Family Medicine)  HEME/ONC OVERVIEW: 1. CLL with bulky lymphadenopathy; RAI Stage IV, intermediate risk -03/2018:   -PB flow showed CD5+, CD23+ monoclonal B-cells, c/w CLL; CD38, ZAP-70 negative, suggesting IgHV mutation present; IgH V mutation pending   -FISH (PB) positive for EBR(83E94); negative t(11;14), p53 deletion/amplification, and ATM (11q22.3) deletion  -CT neck, CAP showed bulky lymphadenopathy in bilateral cervical LN's and throughout the chest, abdomen and pelvis; marked splenomegaly -04/2018: R axillary excision LN biopsy showed CLL/SLL (cyclin D1 neg) 2. Normocytic anemia 3. Thrombocytopenia   TREATMENT REGIMEN: -Ibrutinib 04/2018 - present    ASSESSMENT & PLAN:   CLL with diffuse lymphadenopathy; RAI Stage IV, intermediate risk  -Peripheral blood flow cytometry and excisional LN biopsy confirmed CLL  -IgHV mutation pending on the peripheral blood -Ibrutinib was discussed initially as a treatment option; given his young age and absence of p53 deltion, if IgHV mutation is confirmed, FCR is also a reasonable treatment option that may offer sustained disease control -I reviewed the NCCN guideline with the patient. -We discussed some of the risks, benefits and side-effects of rituximab, fludarabine and cyclophosphamide.  -Some of the short term side-effects included, though not limited to, risk of fatigue, weight loss, tumor lysis syndrome, risk of allergic reactions, pancytopenia, life-threatening infections, need for transfusions of blood products, nausea, vomiting, change in bowel habits, admission to hospital for various reasons, and risks of death.  -Long term side-effects are also discussed including permanent damage to nerve function, chronic fatigue, and rare secondary malignancy including bone marrow disorders.  -The patient is aware that the response rates  discussed earlier is not guaranteed.   -The patient expressed understanding, and would like to discuss this further with his wife before making a decision regarding treatment -Meanwhile, we will await the results of IgHV mutation -If IgHV mutation is confirmed, and the patient would like to proceed with FCR, he will require port placement prior to starting treatment (IR or by Dr. Dalbert Batman) -On allopurinol for TLS, given the bulky lymphadenopathy  -VZV and PJP ppx: acyclovir and Bactrim -PRN supportive medications: loperamide, Zofran   Normocytic anemia -Hgb ~11 -Coombs negative, T bili normal, not suggestive of hemolysis -We will continue to monitor it for now   Thrombocytopenia -Plts ~70k -Patient denies any abnormal bleeding or bruising  -We will continue   No orders of the defined types were placed in this encounter.  All questions were answered. The patient knows to call the clinic with any problems, questions or concerns. No barriers to learning was detected.  A total of more than 25 minutes were spent face-to-face with the patient during this encounter and over half of that time was spent on counseling and coordination of care as outlined above.   Return on 04/30/2018 to follow up IgV mutation analysis and patient's decision for treatment.   [name], MD 04/23/2018 1:19 PM  CHIEF COMPLAINT: "I am doing fine"  INTERVAL HISTORY: Mr. Marinaro returns to clinic for follow-up of CLL.  He reports that he had mild right axillary soreness after recent axillary excisional lymph node biopsy, but the soreness is improving and he has regained full range of motion at the right shoulder.  He denies any enlarging cervical lymphadenopathy.  He denies any fever, chill, night sweats, or weight loss.  SUMMARY OF ONCOLOGIC HISTORY:   CLL (chronic lymphocytic leukemia) ([place])   04/01/2018 Initial Diagnosis  CLL (chronic lymphocytic leukemia) ([place])    04/05/2018 Pathology Results     Peripheral Blood Flow Cytometry - MONOCLONAL B-CELL POPULATION IDENTIFIED. - SEE NOTE.    04/05/2018 Pathology Results    PB FISH: No evidence of CCND1-IGH [t(11;14)] gene rearrangement. No evidence of trisomy 11 or gain of 11q. (excludes mantle cell lymphoma) No evidence of trisomy 12.  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 No evidence of p53 (17p13( deletion or amplification. No evidence of ATM (11q22.3) deletion.     04/07/2018 Imaging    CT neck w/ contrast: IMPRESSION: Bulky bilateral cervical lymphadenopathy consistent with lymphoproliferative disease/lymphoma.    04/07/2018 Imaging    CT CAP w/ contrast: IMPRESSION: 1. Bulky lymphadenopathy throughout the chest, abdomen and pelvis, as detailed above, highly suspicious for lymphoma. 2. Marked splenomegaly. 3. 9 mm pulmonary nodule within the anterior inferior portion of the RIGHT upper lobe. Subtle low-density nodule within the LEFT lower lobe. These are too small to definitively characterize. Neoplastic pulmonary nodules cannot be excluded. Consider PET-CT for further characterization.    04/16/2018 Surgery    Right axillary LN excisional biopsy    04/16/2018 Pathology Results    (Accession: [id number]) Lymph node for lymphoma, Right Axillary - CHRONIC LYMPHOCYTIC LEUKEMIA/SMALL LYMPHOCYTIC LYMPHOMA.     REVIEW OF SYSTEMS:   Constitutional: ( - ) fevers, ( - )  chills , ( - ) night sweats Eyes: ( - ) blurriness of vision, ( - ) double vision, ( - ) watery eyes Ears, nose, mouth, throat, and face: ( - ) mucositis, ( - ) sore throat Respiratory: ( - ) cough, ( - ) dyspnea, ( - ) wheezes Cardiovascular: ( - ) palpitation, ( - ) chest discomfort, ( - ) lower extremity swelling Gastrointestinal:  ( - ) nausea, ( - ) heartburn, ( - ) change in bowel habits Skin: ( - ) abnormal skin rashes Lymphatics: ( - ) new lymphadenopathy, ( - ) easy bruising Neurological: ( - ) numbness, ( - ) tingling, ( - )  new weaknesses Behavioral/Psych: ( - ) mood change, ( - ) new changes  All other systems were reviewed with the patient and are negative.  I have reviewed the past medical history, past surgical history, social history and family history with the patient and they are unchanged from previous note.  ALLERGIES:  is allergic to amoxicillin.  MEDICATIONS:  Current Outpatient Medications  Medication Sig Dispense Refill  . allopurinol (ZYLOPRIM) 300 MG tablet Take 1 tablet (300 mg total) by mouth daily. 90 tablet 3  . HYDROcodone-acetaminophen (NORCO) 5-325 MG tablet Take 1-2 tablets by mouth every 6 (six) hours as needed for moderate pain or severe pain. 20 tablet 0  . Ibrutinib 420 MG TABS Take 420 mg by mouth daily. 30 tablet 11  . acyclovir (ZOVIRAX) 400 MG tablet Take 1 tablet (400 mg total) by mouth 2 (two) times daily. 180 tablet 3  . sulfamethoxazole-trimethoprim (BACTRIM DS,SEPTRA DS) 800-160 MG tablet Take 1 tablet by mouth 3 (three) times a week. 36 tablet 3   No current facility-administered medications for this visit.     PHYSICAL EXAMINATION: ECOG PERFORMANCE STATUS: 0 - Asymptomatic  Today's Vitals   04/23/18 1032  BP: 108/67  Pulse: 74  Resp: 17  Temp: 98.3 F (36.8 C)  SpO2: 100%  Weight: 208 lb (94.3 kg)  Height: '5\' 10"'  (1.778 m)   Body mass index is 29.85 kg/m.  Filed Weights   04/23/18 1032  Weight:  208 lb (94.3 kg)    GENERAL: alert, no distress and comfortable SKIN: skin color, texture, turgor are normal, no rashes or significant lesions EYES: conjunctiva are pink and non-injected, sclera clear OROPHARYNX: no exudate, no erythema; lips, buccal mucosa, and tongue normal  LYMPH:  Palpable multiple posterior occipital LN's (~1-2cm), bulky cervical and supraclavicular LN's bilaterally LUNGS: clear to auscultation and percussion with normal breathing effort HEART: regular rate & rhythm, no murmurs, no lower extremity edema ABDOMEN: soft, non-tender,  non-distended, normal bowel sounds, no liver or spleen enlargement palpated  Musculoskeletal: no cyanosis of digits and no clubbing  PSYCH: alert & oriented x 3, fluent speech NEURO: no focal motor/sensory deficits  LABORATORY DATA:  I have reviewed the data as listed    Component Value Date/Time   NA 139 04/21/2018 1516   NA 142 03/31/2018 1649   K 4.3 04/21/2018 1516   CL 105 04/21/2018 1516   CO2 29 04/21/2018 1516   GLUCOSE 106 (H) 04/21/2018 1516   BUN 23 (H) 04/21/2018 1516   BUN 16 03/31/2018 1649   CREATININE 1.26 (H) 04/21/2018 1516   CALCIUM 9.5 04/21/2018 1516   PROT 5.8 (L) 04/21/2018 1516   PROT 5.9 (L) 03/31/2018 1649   ALBUMIN 4.5 04/21/2018 1516   ALBUMIN 4.6 03/31/2018 1649   AST 17 04/21/2018 1516   ALT 13 04/21/2018 1516   ALKPHOS 115 04/21/2018 1516   BILITOT 0.4 04/21/2018 1516   GFRNONAA >60 04/21/2018 1516   GFRAA >60 04/21/2018 1516    No results found for: SPEP, UPEP  Lab Results  Component Value Date   WBC 178.6 (HH) 04/21/2018   NEUTROABS 2.7 04/21/2018   HGB 10.5 (L) 04/21/2018   HCT 35.0 (L) 04/21/2018   MCV 88.4 04/21/2018   PLT 79 (L) 04/21/2018      Chemistry      Component Value Date/Time   NA 139 04/21/2018 1516   NA 142 03/31/2018 1649   K 4.3 04/21/2018 1516   CL 105 04/21/2018 1516   CO2 29 04/21/2018 1516   BUN 23 (H) 04/21/2018 1516   BUN 16 03/31/2018 1649   CREATININE 1.26 (H) 04/21/2018 1516      Component Value Date/Time   CALCIUM 9.5 04/21/2018 1516   ALKPHOS 115 04/21/2018 1516   AST 17 04/21/2018 1516   ALT 13 04/21/2018 1516   BILITOT 0.4 04/21/2018 1516       RADIOGRAPHIC STUDIES: I have personally reviewed the radiological images as listed below and agreed with the findings in the report. Dg Chest 2 View  Result Date: 03/31/2018 CLINICAL DATA:  Swollen lymph nodes for 1 week.  Denies chest pain. EXAM: CHEST - 2 VIEW COMPARISON:  None. FINDINGS: The heart size and mediastinal contours are within  normal limits. Both lungs are clear. The visualized skeletal structures are unremarkable. IMPRESSION: No active cardiopulmonary disease. Electronically Signed   By: Kathreen Devoid   On: 03/31/2018 17:45   Ct Soft Tissue Neck W Contrast  Result Date: 04/07/2018 CLINICAL DATA:  Lymphadenopathy. EXAM: CT NECK WITH CONTRAST TECHNIQUE: Multidetector CT imaging of the neck was performed using the standard protocol following the bolus administration of intravenous contrast. CONTRAST:  148m OMNIPAQUE IOHEXOL 300 MG/ML  SOLN COMPARISON:  None. FINDINGS: Pharynx and larynx: Moderate enlargement of the palatine tonsils and a relatively symmetric fashion without a discrete enhancing lesion identified. Mild symmetric prominence of the posterior nasopharyngeal soft tissues. Patent airway. Salivary glands: Unremarkable submandibular glands. Multiple enlarged intraparotid and  periparotid lymph nodes bilaterally. Thyroid: Unremarkable. Lymph nodes: Bulky lymphadenopathy throughout all stations of the neck bilaterally. Reference lymph nodes measure 3.0 x 2.5 cm inferiorly in right level IIA (series 3, image 48), 2.8 x 2.2 cm in left level IIB (series 3, image 45), and 3.0 x 2.5 cm and 3.1 x 2.2 cm in the right and left supraclavicular regions, respectively (series 3, image 73). Vascular: Major vascular structures of the neck are patent. Limited intracranial: Unremarkable. Visualized orbits: Unremarkable. Mastoids and visualized paranasal sinuses: Mild right maxillary and ethmoid sinus mucosal thickening. Clear mastoid air cells. Skeleton: No acute osseous abnormality or suspicious osseous lesion. Mild thoracic disc degeneration. Upper chest: Reported separately. Other: None. IMPRESSION: Bulky bilateral cervical lymphadenopathy consistent with lymphoproliferative disease/lymphoma. Electronically Signed   By: Logan Bores M.D.   On: 04/07/2018 14:26   Ct Chest W Contrast  Result Date: 04/08/2018 CLINICAL DATA:  Swollen lymph  nodes, elevated WBCs. Unintentional weight loss. Workup for possible CLL. EXAM: CT CHEST, ABDOMEN, AND PELVIS WITH CONTRAST TECHNIQUE: Multidetector CT imaging of the chest, abdomen and pelvis was performed following the standard protocol during bolus administration of intravenous contrast. CONTRAST:  111m OMNIPAQUE IOHEXOL 300 MG/ML  SOLN COMPARISON:  None. FINDINGS: CT CHEST FINDINGS Cardiovascular: Heart size is normal. No pericardial effusion. No thoracic aortic aneurysm. No central obstructing pulmonary embolism. Mediastinum/Nodes: Bulky lymphadenopathy throughout the mediastinum and perihilar regions, largest measurable lymph node in the RIGHT hilum measuring 2.6 cm short axis dimension. Esophagus is unremarkable. Trachea and central bronchi are unremarkable. Lungs/Pleura: 9 mm pulmonary nodule within the anterior inferior portion of the RIGHT upper lobe (series 8, image 58). Subtle low-density nodule within the LEFT lower lobe measures 6 mm (series 8, image 75). Musculoskeletal: No acute or suspicious osseous finding. CT ABDOMEN PELVIS FINDINGS Hepatobiliary: No focal liver abnormality is seen. No gallstones, gallbladder wall thickening, or biliary dilatation. Pancreas: Unremarkable. No pancreatic ductal dilatation or surrounding inflammatory changes. Spleen: Marked splenomegaly. Adrenals/Urinary Tract: Adrenal glands appear normal. Kidneys are unremarkable without mass, stone or hydronephrosis. Bladder appears normal. Stomach/Bowel: No dilated large or small bowel loops. No evidence of bowel wall inflammation. Appendix is normal. Stomach is unremarkable, decompressed. Vascular/Lymphatic: No vascular abnormality. Bulky lymphadenopathy throughout the retroperitoneum, largest measurable lymph node in the LEFT periaortic retroperitoneum measuring 1.8 cm short axis dimension (series 3, image 76). Bulky adenopathy within the bilateral iliac chain regions, including markedly enlarged lymph nodes just proximal to  the inguinal canals bilaterally, on the LEFT measuring 6.3 x 3.5 cm. Additional bulky lymphadenopathy within the bilateral inguinal regions. Bulky lymphadenopathy throughout the mesentery, peripancreatic, portacaval and porta hepatis regions. Reproductive: Prostate is unremarkable. Other: No free fluid or abscess collection. No free intraperitoneal air. Musculoskeletal: No acute or suspicious osseous finding. Mild degenerative spondylosis of the lumbar spine. IMPRESSION: 1. Bulky lymphadenopathy throughout the chest, abdomen and pelvis, as detailed above, highly suspicious for lymphoma. 2. Marked splenomegaly. 3. 9 mm pulmonary nodule within the anterior inferior portion of the RIGHT upper lobe. Subtle low-density nodule within the LEFT lower lobe. These are too small to definitively characterize. Neoplastic pulmonary nodules cannot be excluded. Consider PET-CT for further characterization. Electronically Signed   By: SFranki CabotM.D.   On: 04/08/2018 08:25   Ct Abdomen Pelvis W Contrast  Result Date: 04/08/2018 CLINICAL DATA:  Swollen lymph nodes, elevated WBCs. Unintentional weight loss. Workup for possible CLL. EXAM: CT CHEST, ABDOMEN, AND PELVIS WITH CONTRAST TECHNIQUE: Multidetector CT imaging of the chest, abdomen and pelvis was performed  following the standard protocol during bolus administration of intravenous contrast. CONTRAST:  191m OMNIPAQUE IOHEXOL 300 MG/ML  SOLN COMPARISON:  None. FINDINGS: CT CHEST FINDINGS Cardiovascular: Heart size is normal. No pericardial effusion. No thoracic aortic aneurysm. No central obstructing pulmonary embolism. Mediastinum/Nodes: Bulky lymphadenopathy throughout the mediastinum and perihilar regions, largest measurable lymph node in the RIGHT hilum measuring 2.6 cm short axis dimension. Esophagus is unremarkable. Trachea and central bronchi are unremarkable. Lungs/Pleura: 9 mm pulmonary nodule within the anterior inferior portion of the RIGHT upper lobe (series 8,  image 58). Subtle low-density nodule within the LEFT lower lobe measures 6 mm (series 8, image 75). Musculoskeletal: No acute or suspicious osseous finding. CT ABDOMEN PELVIS FINDINGS Hepatobiliary: No focal liver abnormality is seen. No gallstones, gallbladder wall thickening, or biliary dilatation. Pancreas: Unremarkable. No pancreatic ductal dilatation or surrounding inflammatory changes. Spleen: Marked splenomegaly. Adrenals/Urinary Tract: Adrenal glands appear normal. Kidneys are unremarkable without mass, stone or hydronephrosis. Bladder appears normal. Stomach/Bowel: No dilated large or small bowel loops. No evidence of bowel wall inflammation. Appendix is normal. Stomach is unremarkable, decompressed. Vascular/Lymphatic: No vascular abnormality. Bulky lymphadenopathy throughout the retroperitoneum, largest measurable lymph node in the LEFT periaortic retroperitoneum measuring 1.8 cm short axis dimension (series 3, image 76). Bulky adenopathy within the bilateral iliac chain regions, including markedly enlarged lymph nodes just proximal to the inguinal canals bilaterally, on the LEFT measuring 6.3 x 3.5 cm. Additional bulky lymphadenopathy within the bilateral inguinal regions. Bulky lymphadenopathy throughout the mesentery, peripancreatic, portacaval and porta hepatis regions. Reproductive: Prostate is unremarkable. Other: No free fluid or abscess collection. No free intraperitoneal air. Musculoskeletal: No acute or suspicious osseous finding. Mild degenerative spondylosis of the lumbar spine. IMPRESSION: 1. Bulky lymphadenopathy throughout the chest, abdomen and pelvis, as detailed above, highly suspicious for lymphoma. 2. Marked splenomegaly. 3. 9 mm pulmonary nodule within the anterior inferior portion of the RIGHT upper lobe. Subtle low-density nodule within the LEFT lower lobe. These are too small to definitively characterize. Neoplastic pulmonary nodules cannot be excluded. Consider PET-CT for further  characterization. Electronically Signed   By: SFranki CabotM.D.   On: 04/08/2018 08:25

## 2018-04-20 NOTE — Telephone Encounter (Signed)
Oral Chemotherapy Pharmacist Encounter   Patient has decided to go with IV therapy over Imbruvica at this time.   Darl Pikes, PharmD, BCPS, [facility] Hematology/Oncology Clinical Pharmacist [facility]/HP/AP [facility] [phone]  04/27/2018 1:53 PM

## 2018-04-21 ENCOUNTER — Other Ambulatory Visit: Payer: Self-pay | Admitting: Hematology

## 2018-04-21 ENCOUNTER — Encounter: Payer: Self-pay | Admitting: *Deleted

## 2018-04-21 ENCOUNTER — Inpatient Hospital Stay: Payer: BLUE CROSS/BLUE SHIELD

## 2018-04-21 DIAGNOSIS — D72820 Lymphocytosis (symptomatic): Secondary | ICD-10-CM

## 2018-04-21 DIAGNOSIS — R7989 Other specified abnormal findings of blood chemistry: Secondary | ICD-10-CM | POA: Diagnosis not present

## 2018-04-21 DIAGNOSIS — D72819 Decreased white blood cell count, unspecified: Secondary | ICD-10-CM | POA: Diagnosis not present

## 2018-04-21 DIAGNOSIS — R161 Splenomegaly, not elsewhere classified: Secondary | ICD-10-CM | POA: Diagnosis not present

## 2018-04-21 DIAGNOSIS — C9110 Chronic lymphocytic leukemia of B-cell type not having achieved remission: Secondary | ICD-10-CM

## 2018-04-21 DIAGNOSIS — D649 Anemia, unspecified: Secondary | ICD-10-CM | POA: Diagnosis not present

## 2018-04-21 DIAGNOSIS — R590 Localized enlarged lymph nodes: Secondary | ICD-10-CM | POA: Diagnosis not present

## 2018-04-21 DIAGNOSIS — M25562 Pain in left knee: Secondary | ICD-10-CM | POA: Diagnosis not present

## 2018-04-21 DIAGNOSIS — D696 Thrombocytopenia, unspecified: Secondary | ICD-10-CM | POA: Diagnosis not present

## 2018-04-21 LAB — CMP (CANCER CENTER ONLY)
ALT: 13 U/L (ref 0–44)
AST: 17 U/L (ref 15–41)
Albumin: 4.5 g/dL (ref 3.5–5.0)
Alkaline Phosphatase: 115 U/L (ref 38–126)
Anion gap: 5 (ref 5–15)
BUN: 23 mg/dL — ABNORMAL HIGH (ref 6–20)
CO2: 29 mmol/L (ref 22–32)
Calcium: 9.5 mg/dL (ref 8.9–10.3)
Chloride: 105 mmol/L (ref 98–111)
Creatinine: 1.26 mg/dL — ABNORMAL HIGH (ref 0.61–1.24)
GFR, Est AFR Am: >60 mL/min (ref >60)
GFR, Estimated: >60 mL/min (ref >60)
Glucose, Bld: 106 mg/dL — ABNORMAL HIGH (ref 70–99)
Potassium: 4.3 mmol/L (ref 3.5–5.1)
Sodium: 139 mmol/L (ref 135–145)
Total Bilirubin: 0.4 mg/dL (ref 0.3–1.2)
Total Protein: 5.8 g/dL — ABNORMAL LOW (ref 6.5–8.1)

## 2018-04-21 NOTE — Progress Notes (Signed)
Responding to patient's MyChart message  Dr Maylon Peppers would like patient to come in for IGHV testing to determine whether or not patient would benefit from systemic treatment over the Ibrutinib. Dr Maylon Peppers has asked for pathology to add onto LN biopsy, however if patient can come in for peripheral lab draw today, this would be preferred.   Information also forwarded to Otilio Carpen, financial specialist to address the issue of his insurance deeming the CT scan necessary.   Attempted to get in touch with patient numerous times without success. Unable to leave voice mail. Will send an additional MyChart message.

## 2018-04-22 ENCOUNTER — Encounter: Payer: Self-pay | Admitting: *Deleted

## 2018-04-22 LAB — CBC WITH DIFFERENTIAL (CANCER CENTER ONLY)
Band Neutrophils: 0 %
Basophils Absolute: 0 10*3/uL (ref 0.0–0.1)
Basophils Relative: 0 %
Eosinophils Absolute: 0 10*3/uL (ref 0.0–0.5)
Eosinophils Relative: 0 %
HCT: 35 % — ABNORMAL LOW (ref 39.0–52.0)
Hemoglobin: 10.5 g/dL — ABNORMAL LOW (ref 13.0–17.0)
Lymphocytes Relative: 84 %
Lymphs Abs: 149.2 10*3/uL — ABNORMAL HIGH (ref 0.7–4.0)
MCH: 26.5 pg (ref 26.0–34.0)
MCHC: 30 g/dL (ref 30.0–36.0)
MCV: 88.4 fL (ref 80.0–100.0)
Monocytes Absolute: 26 10*3/uL — ABNORMAL HIGH (ref 0.1–1.0)
Monocytes Relative: 15 %
Neutro Abs: 2.7 10*3/uL (ref 1.7–7.7)
Neutrophils Relative %: 2 %
Platelet Count: 79 10*3/uL — ABNORMAL LOW (ref 150–400)
RBC: 3.96 MIL/uL — ABNORMAL LOW (ref 4.22–5.81)
RDW: 17 % — ABNORMAL HIGH (ref 11.5–15.5)
WBC Count: 178.6 10*3/uL (ref 4.0–10.5)
nRBC: 0 % (ref 0.0–0.2)

## 2018-04-22 LAB — FISH,CLL PROGNOSTIC PANEL

## 2018-04-22 LAB — LACTATE DEHYDROGENASE: LDH: 325 U/L — ABNORMAL HIGH (ref 98–192)

## 2018-04-23 ENCOUNTER — Encounter: Payer: Self-pay | Admitting: *Deleted

## 2018-04-23 ENCOUNTER — Inpatient Hospital Stay (HOSPITAL_BASED_OUTPATIENT_CLINIC_OR_DEPARTMENT_OTHER): Payer: BLUE CROSS/BLUE SHIELD | Admitting: Hematology

## 2018-04-23 ENCOUNTER — Other Ambulatory Visit: Payer: Self-pay

## 2018-04-23 ENCOUNTER — Inpatient Hospital Stay: Payer: BLUE CROSS/BLUE SHIELD

## 2018-04-23 ENCOUNTER — Other Ambulatory Visit: Payer: Self-pay | Admitting: Family

## 2018-04-23 ENCOUNTER — Encounter: Payer: Self-pay | Admitting: Hematology

## 2018-04-23 ENCOUNTER — Other Ambulatory Visit: Payer: BLUE CROSS/BLUE SHIELD

## 2018-04-23 VITALS — BP 108/67 | HR 74 | Temp 98.3°F | Resp 17 | Ht 70.0 in | Wt 208.0 lb

## 2018-04-23 DIAGNOSIS — C9110 Chronic lymphocytic leukemia of B-cell type not having achieved remission: Secondary | ICD-10-CM | POA: Diagnosis not present

## 2018-04-23 DIAGNOSIS — R161 Splenomegaly, not elsewhere classified: Secondary | ICD-10-CM

## 2018-04-23 DIAGNOSIS — D696 Thrombocytopenia, unspecified: Secondary | ICD-10-CM

## 2018-04-23 DIAGNOSIS — R7989 Other specified abnormal findings of blood chemistry: Secondary | ICD-10-CM | POA: Diagnosis not present

## 2018-04-23 DIAGNOSIS — D649 Anemia, unspecified: Secondary | ICD-10-CM | POA: Diagnosis not present

## 2018-04-23 DIAGNOSIS — R590 Localized enlarged lymph nodes: Secondary | ICD-10-CM | POA: Diagnosis not present

## 2018-04-23 DIAGNOSIS — M25562 Pain in left knee: Secondary | ICD-10-CM | POA: Diagnosis not present

## 2018-04-23 MED ORDER — ACYCLOVIR 400 MG PO TABS: 400.0000 mg | ORAL_TABLET | Freq: Two times a day (BID) | ORAL | 3 refills | Status: AC

## 2018-04-23 MED ORDER — SULFAMETHOXAZOLE-TRIMETHOPRIM 800-160 MG PO TABS: 1.0000 | ORAL_TABLET | ORAL | 3 refills | Status: DC

## 2018-04-26 ENCOUNTER — Telehealth: Payer: Self-pay | Admitting: *Deleted

## 2018-04-26 NOTE — Telephone Encounter (Signed)
Received voice mail message from patient stating,"please tell Dr. Maylon Peppers that I want to do IV chemotherapy. Thank you."

## 2018-04-28 LAB — GENARRAY MOLECULAR KARYOTYPING FOR CLL

## 2018-04-29 ENCOUNTER — Other Ambulatory Visit: Payer: Self-pay | Admitting: Hematology

## 2018-04-29 DIAGNOSIS — C9110 Chronic lymphocytic leukemia of B-cell type not having achieved remission: Secondary | ICD-10-CM

## 2018-04-30 ENCOUNTER — Encounter: Payer: Self-pay | Admitting: Hematology

## 2018-04-30 ENCOUNTER — Telehealth: Payer: Self-pay | Admitting: *Deleted

## 2018-04-30 ENCOUNTER — Inpatient Hospital Stay: Payer: BLUE CROSS/BLUE SHIELD

## 2018-04-30 ENCOUNTER — Other Ambulatory Visit: Payer: Self-pay | Admitting: Hematology

## 2018-04-30 ENCOUNTER — Inpatient Hospital Stay (HOSPITAL_BASED_OUTPATIENT_CLINIC_OR_DEPARTMENT_OTHER): Payer: BLUE CROSS/BLUE SHIELD | Admitting: Hematology

## 2018-04-30 ENCOUNTER — Ambulatory Visit (HOSPITAL_BASED_OUTPATIENT_CLINIC_OR_DEPARTMENT_OTHER)
Admission: RE | Admit: 2018-04-30 | Discharge: 2018-04-30 | Disposition: A | Discharge status: Home / Self Care | Payer: BLUE CROSS/BLUE SHIELD | Source: Ambulatory Visit | Attending: Hematology | Admitting: Hematology

## 2018-04-30 VITALS — BP 116/65 | HR 98 | Temp 98.5°F | Resp 18 | Ht 70.0 in | Wt 211.1 lb

## 2018-04-30 DIAGNOSIS — R7989 Other specified abnormal findings of blood chemistry: Secondary | ICD-10-CM | POA: Diagnosis not present

## 2018-04-30 DIAGNOSIS — D649 Anemia, unspecified: Secondary | ICD-10-CM

## 2018-04-30 DIAGNOSIS — D696 Thrombocytopenia, unspecified: Secondary | ICD-10-CM

## 2018-04-30 DIAGNOSIS — M7122 Synovial cyst of popliteal space [Baker], left knee: Secondary | ICD-10-CM | POA: Diagnosis not present

## 2018-04-30 DIAGNOSIS — M79605 Pain in left leg: Secondary | ICD-10-CM

## 2018-04-30 DIAGNOSIS — M25562 Pain in left knee: Secondary | ICD-10-CM | POA: Diagnosis not present

## 2018-04-30 DIAGNOSIS — C9110 Chronic lymphocytic leukemia of B-cell type not having achieved remission: Secondary | ICD-10-CM

## 2018-04-30 DIAGNOSIS — R161 Splenomegaly, not elsewhere classified: Secondary | ICD-10-CM | POA: Diagnosis not present

## 2018-04-30 DIAGNOSIS — R590 Localized enlarged lymph nodes: Secondary | ICD-10-CM | POA: Diagnosis not present

## 2018-04-30 LAB — CBC WITH DIFFERENTIAL (CANCER CENTER ONLY)
Abs Immature Granulocytes: 0 10*3/uL (ref 0.00–0.07)
Basophils Absolute: 0 10*3/uL (ref 0.0–0.1)
Basophils Relative: 0 %
Eosinophils Absolute: 0 10*3/uL (ref 0.0–0.5)
Eosinophils Relative: 0 %
HCT: 32.3 % — ABNORMAL LOW (ref 39.0–52.0)
Hemoglobin: 9.7 g/dL — ABNORMAL LOW (ref 13.0–17.0)
Lymphocytes Relative: 90 %
Lymphs Abs: 103 10*3/uL — ABNORMAL HIGH (ref 0.7–4.0)
MCH: 26 pg (ref 26.0–34.0)
MCHC: 30 g/dL (ref 30.0–36.0)
MCV: 86.6 fL (ref 80.0–100.0)
Monocytes Absolute: 10.3 10*3/uL — ABNORMAL HIGH (ref 0.1–1.0)
Monocytes Relative: 9 %
Neutro Abs: 1.1 10*3/uL — ABNORMAL LOW (ref 1.7–7.7)
Neutrophils Relative %: 1 %
Platelet Count: 65 10*3/uL — ABNORMAL LOW (ref 150–400)
RBC: 3.73 MIL/uL — ABNORMAL LOW (ref 4.22–5.81)
RDW: 16.8 % — ABNORMAL HIGH (ref 11.5–15.5)
WBC Count: 114.4 10*3/uL (ref 4.0–10.5)
nRBC: 0 % (ref 0.0–0.2)

## 2018-04-30 LAB — CMP (CANCER CENTER ONLY)
ALT: 12 U/L (ref 0–44)
AST: 19 U/L (ref 15–41)
Albumin: 4.3 g/dL (ref 3.5–5.0)
Alkaline Phosphatase: 117 U/L (ref 38–126)
Anion gap: 6 (ref 5–15)
BUN: 15 mg/dL (ref 6–20)
CO2: 28 mmol/L (ref 22–32)
Calcium: 9.2 mg/dL (ref 8.9–10.3)
Chloride: 106 mmol/L (ref 98–111)
Creatinine: 1.35 mg/dL — ABNORMAL HIGH (ref 0.61–1.24)
GFR, Est AFR Am: >60 mL/min (ref >60)
GFR, Estimated: >60 mL/min (ref >60)
Glucose, Bld: 150 mg/dL — ABNORMAL HIGH (ref 70–99)
Potassium: 3.9 mmol/L (ref 3.5–5.1)
Sodium: 140 mmol/L (ref 135–145)
Total Bilirubin: 0.5 mg/dL (ref 0.3–1.2)
Total Protein: 5.5 g/dL — ABNORMAL LOW (ref 6.5–8.1)

## 2018-04-30 LAB — LACTATE DEHYDROGENASE: LDH: 380 U/L — ABNORMAL HIGH (ref 98–192)

## 2018-04-30 NOTE — Telephone Encounter (Signed)
Critical Value WBC 114.4 Dr Maylon Peppers notified. No orders at this time

## 2018-04-30 NOTE — Progress Notes (Signed)
[place] OFFICE PROGRESS NOTE  Patient Care Team: [name], MD as PCP - General (Family Medicine)  HEME/ONC OVERVIEW: 1. CLL with bulky lymphadenopathy; RAI Stage IV, intermediate risk -03/2018:   -PB flow showed CD5+, CD23+ monoclonal B-cells, c/w CLL; CD38, ZAP-70 negative, suggesting IgHV mutation present; IgH V mutation pending   -FISH (PB) positive for TOI(71I45); negative t(11;14), p53 deletion/amplification, and ATM (11q22.3) deletion  -CT neck, CAP showed bulky lymphadenopathy in bilateral cervical LN's and throughout the chest, abdomen and pelvis; marked splenomegaly -04/2018: R axillary excision LN biopsy showed CLL/SLL (cyclin D1 neg) 2. Normocytic anemia 3. Thrombocytopenia   TREATMENT REGIMEN:  TBD: ibrutinib vs. FCR pending IgHV mutation analysis   ASSESSMENT & PLAN:   CLL with diffuse lymphadenopathy; RAI Stage IV, intermediate risk  -Peripheral blood flow cytometry and excisional LN biopsy confirmed CLL  -IgHV mutation sent over 1 week ago, but unfortunately it is still pending  -Ibrutinib was discussed initially as a treatment option; given his young age and absence of p53 deltion, if IgHV mutation is confirmed, FCR is also a reasonable treatment option that may offer sustained disease control -After extensive discussions with the patient as well as between the patient and his wife, he chose to receive FCR if IgHV mutation is positive  -He will need port placement (IR or Dr. Dalbert Batman) and chemo education prior to starting FCR -On allopurinol for TLS, given the bulky lymphadenopathy  -VZV and PJP ppx: acyclovir and Bactrim -PRN supportive medications: loperamide, Zofran   Normocytic anemia -Hgb ~11 -Coombs negative, T bili normal, not suggestive of hemolysis -We will continue to monitor it for now   Thrombocytopenia -Plts ~70k -Patient denies any abnormal bleeding or bruising  -We will continue   New onset left popliteal fossa  pain -Patient denies any recent trauma or injury to that area; he has hx of injury to the left knee in the past  -Exam unremarkable for unilateral extremity swelling; no tenderness in the left calf -Possibly due to Baker's cyst; I have ordered doppler of left lower extremity to rule out DVT   No orders of the defined types were placed in this encounter.  All questions were answered. The patient knows to call the clinic with any problems, questions or concerns. No barriers to learning was detected.  A total of more than 25 minutes were spent face-to-face with the patient during this encounter and over half of that time was spent on counseling and coordination of care as outlined above.   Tentatively return on 05/17/2018 for labs, port flush, and start of C1 FCR, if IgHV mutation is positive. If IgHV mutation is negative, then he will proceed with ibrunitib and we will reschedule the appt.   [name], MD 04/30/2018 1:44 PM  CHIEF COMPLAINT: "My left knee hurts a little"  INTERVAL HISTORY: Mr. Nance returns to clinic for follow-up of CLL.  He reports that he has had mild stiffness and pain behind the left knee for the past few weeks, but over the last week, the stiffness has gotten more pronounced, as well as intermittent pain with walking.  He denies any recent trauma or injury to the area.  He has a history of injury to the left knee.  He denies any unilateral lower extremity swelling, chest pain, dyspnea, cough, or hemoptysis.  He feels that the lymph nodes are stable in size.  He denies any fever, chill, night sweats or weight change.  SUMMARY OF ONCOLOGIC HISTORY:  CLL (chronic lymphocytic leukemia) ([place])   04/01/2018 Initial Diagnosis    CLL (chronic lymphocytic leukemia) ([place])    04/05/2018 Pathology Results    Peripheral Blood Flow Cytometry - MONOCLONAL B-CELL POPULATION IDENTIFIED. - SEE NOTE.    04/05/2018 Pathology Results    PB FISH: No evidence of CCND1-IGH [t(11;14)]  gene rearrangement. No evidence of trisomy 11 or gain of 11q. (excludes mantle cell lymphoma) No evidence of trisomy 12.  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 No evidence of p53 (17p13( deletion or amplification. No evidence of ATM (11q22.3) deletion.     04/07/2018 Imaging    CT neck w/ contrast: IMPRESSION: Bulky bilateral cervical lymphadenopathy consistent with lymphoproliferative disease/lymphoma.    04/07/2018 Imaging    CT CAP w/ contrast: IMPRESSION: 1. Bulky lymphadenopathy throughout the chest, abdomen and pelvis, as detailed above, highly suspicious for lymphoma. 2. Marked splenomegaly. 3. 9 mm pulmonary nodule within the anterior inferior portion of the RIGHT upper lobe. Subtle low-density nodule within the LEFT lower lobe. These are too small to definitively characterize. Neoplastic pulmonary nodules cannot be excluded. Consider PET-CT for further characterization.    04/16/2018 Surgery    Right axillary LN excisional biopsy    04/16/2018 Pathology Results    (Accession: [id number]) Lymph node for lymphoma, Right Axillary - CHRONIC LYMPHOCYTIC LEUKEMIA/SMALL LYMPHOCYTIC LYMPHOMA.     REVIEW OF SYSTEMS:   Constitutional: ( - ) fevers, ( - )  chills , ( - ) night sweats Eyes: ( - ) blurriness of vision, ( - ) double vision, ( - ) watery eyes Ears, nose, mouth, throat, and face: ( - ) mucositis, ( - ) sore throat Respiratory: ( - ) cough, ( - ) dyspnea, ( - ) wheezes Cardiovascular: ( - ) palpitation, ( - ) chest discomfort, ( - ) lower extremity swelling Gastrointestinal:  ( - ) nausea, ( - ) heartburn, ( - ) change in bowel habits Skin: ( - ) abnormal skin rashes Lymphatics: ( - ) new lymphadenopathy, ( - ) easy bruising Neurological: ( - ) numbness, ( - ) tingling, ( - ) new weaknesses Behavioral/Psych: ( - ) mood change, ( - ) new changes  All other systems were reviewed with the patient and are negative.  I have reviewed the past  medical history, past surgical history, social history and family history with the patient and they are unchanged from previous note.  ALLERGIES:  is allergic to amoxicillin.  MEDICATIONS:  Current Outpatient Medications  Medication Sig Dispense Refill  . allopurinol (ZYLOPRIM) 300 MG tablet Take 1 tablet (300 mg total) by mouth daily. 90 tablet 3  . acyclovir (ZOVIRAX) 400 MG tablet Take 1 tablet (400 mg total) by mouth 2 (two) times daily. (Patient not taking: Reported on 04/30/2018) 180 tablet 3  . HYDROcodone-acetaminophen (NORCO) 5-325 MG tablet Take 1-2 tablets by mouth every 6 (six) hours as needed for moderate pain or severe pain. (Patient not taking: Reported on 04/30/2018) 20 tablet 0  . Ibrutinib 420 MG TABS Take 420 mg by mouth daily. (Patient not taking: Reported on 04/30/2018) 30 tablet 11  . sulfamethoxazole-trimethoprim (BACTRIM DS,SEPTRA DS) 800-160 MG tablet Take 1 tablet by mouth 3 (three) times a week. (Patient not taking: Reported on 04/30/2018) 36 tablet 3   No current facility-administered medications for this visit.     PHYSICAL EXAMINATION: ECOG PERFORMANCE STATUS: 0 - Asymptomatic  Today's Vitals   04/30/18 1259 04/30/18 1302  BP: 116/65   Pulse: 98  Resp: 18   Temp: 98.5 F (36.9 C)   TempSrc: Oral   SpO2: 100%   Weight: 211 lb 1.3 oz (95.7 kg)   Height: '5\' 10"'  (1.778 m)   PainSc: 4  4    Body mass index is 30.29 kg/m.  Filed Weights   04/30/18 1259  Weight: 211 lb 1.3 oz (95.7 kg)    GENERAL: alert, no distress and comfortable SKIN: skin color, texture, turgor are normal, no rashes or significant lesions EYES: conjunctiva are pink and non-injected, sclera clear OROPHARYNX: no exudate, no erythema; lips, buccal mucosa, and tongue normal  LYMPH:  Palpable multiple posterior occipital LN's (~1-2cm), bulky cervical and supraclavicular LN's bilaterally LUNGS: clear to auscultation and percussion with normal breathing effort HEART: regular rate &  rhythm, no murmurs, no lower extremity edema ABDOMEN: soft, non-tender, non-distended, normal bowel sounds, no liver or spleen enlargement palpated  Musculoskeletal: no cyanosis of digits and no clubbing; a small nodule palpated in the left popliteal fossa, no calf tenderness or swelling in either lower extremity  PSYCH: alert & oriented x 3, fluent speech NEURO: no focal motor/sensory deficits  LABORATORY DATA:  I have reviewed the data as listed    Component Value Date/Time   NA 139 04/21/2018 1516   NA 142 03/31/2018 1649   K 4.3 04/21/2018 1516   CL 105 04/21/2018 1516   CO2 29 04/21/2018 1516   GLUCOSE 106 (H) 04/21/2018 1516   BUN 23 (H) 04/21/2018 1516   BUN 16 03/31/2018 1649   CREATININE 1.26 (H) 04/21/2018 1516   CALCIUM 9.5 04/21/2018 1516   PROT 5.8 (L) 04/21/2018 1516   PROT 5.9 (L) 03/31/2018 1649   ALBUMIN 4.5 04/21/2018 1516   ALBUMIN 4.6 03/31/2018 1649   AST 17 04/21/2018 1516   ALT 13 04/21/2018 1516   ALKPHOS 115 04/21/2018 1516   BILITOT 0.4 04/21/2018 1516   GFRNONAA >60 04/21/2018 1516   GFRAA >60 04/21/2018 1516    No results found for: SPEP, UPEP  Lab Results  Component Value Date   WBC 178.6 (HH) 04/21/2018   NEUTROABS 2.7 04/21/2018   HGB 10.5 (L) 04/21/2018   HCT 35.0 (L) 04/21/2018   MCV 88.4 04/21/2018   PLT 79 (L) 04/21/2018      Chemistry      Component Value Date/Time   NA 139 04/21/2018 1516   NA 142 03/31/2018 1649   K 4.3 04/21/2018 1516   CL 105 04/21/2018 1516   CO2 29 04/21/2018 1516   BUN 23 (H) 04/21/2018 1516   BUN 16 03/31/2018 1649   CREATININE 1.26 (H) 04/21/2018 1516      Component Value Date/Time   CALCIUM 9.5 04/21/2018 1516   ALKPHOS 115 04/21/2018 1516   AST 17 04/21/2018 1516   ALT 13 04/21/2018 1516   BILITOT 0.4 04/21/2018 1516       RADIOGRAPHIC STUDIES: I have personally reviewed the radiological images as listed below and agreed with the findings in the report. Dg Chest 2 View  Result Date:  03/31/2018 CLINICAL DATA:  Swollen lymph nodes for 1 week.  Denies chest pain. EXAM: CHEST - 2 VIEW COMPARISON:  None. FINDINGS: The heart size and mediastinal contours are within normal limits. Both lungs are clear. The visualized skeletal structures are unremarkable. IMPRESSION: No active cardiopulmonary disease. Electronically Signed   By: Kathreen Devoid   On: 03/31/2018 17:45   Ct Soft Tissue Neck W Contrast  Result Date: 04/07/2018 CLINICAL DATA:  Lymphadenopathy. EXAM:  CT NECK WITH CONTRAST TECHNIQUE: Multidetector CT imaging of the neck was performed using the standard protocol following the bolus administration of intravenous contrast. CONTRAST:  123m OMNIPAQUE IOHEXOL 300 MG/ML  SOLN COMPARISON:  None. FINDINGS: Pharynx and larynx: Moderate enlargement of the palatine tonsils and a relatively symmetric fashion without a discrete enhancing lesion identified. Mild symmetric prominence of the posterior nasopharyngeal soft tissues. Patent airway. Salivary glands: Unremarkable submandibular glands. Multiple enlarged intraparotid and periparotid lymph nodes bilaterally. Thyroid: Unremarkable. Lymph nodes: Bulky lymphadenopathy throughout all stations of the neck bilaterally. Reference lymph nodes measure 3.0 x 2.5 cm inferiorly in right level IIA (series 3, image 48), 2.8 x 2.2 cm in left level IIB (series 3, image 45), and 3.0 x 2.5 cm and 3.1 x 2.2 cm in the right and left supraclavicular regions, respectively (series 3, image 73). Vascular: Major vascular structures of the neck are patent. Limited intracranial: Unremarkable. Visualized orbits: Unremarkable. Mastoids and visualized paranasal sinuses: Mild right maxillary and ethmoid sinus mucosal thickening. Clear mastoid air cells. Skeleton: No acute osseous abnormality or suspicious osseous lesion. Mild thoracic disc degeneration. Upper chest: Reported separately. Other: None. IMPRESSION: Bulky bilateral cervical lymphadenopathy consistent with  lymphoproliferative disease/lymphoma. Electronically Signed   By: ALogan BoresM.D.   On: 04/07/2018 14:26   Ct Chest W Contrast  Result Date: 04/08/2018 CLINICAL DATA:  Swollen lymph nodes, elevated WBCs. Unintentional weight loss. Workup for possible CLL. EXAM: CT CHEST, ABDOMEN, AND PELVIS WITH CONTRAST TECHNIQUE: Multidetector CT imaging of the chest, abdomen and pelvis was performed following the standard protocol during bolus administration of intravenous contrast. CONTRAST:  1060mOMNIPAQUE IOHEXOL 300 MG/ML  SOLN COMPARISON:  None. FINDINGS: CT CHEST FINDINGS Cardiovascular: Heart size is normal. No pericardial effusion. No thoracic aortic aneurysm. No central obstructing pulmonary embolism. Mediastinum/Nodes: Bulky lymphadenopathy throughout the mediastinum and perihilar regions, largest measurable lymph node in the RIGHT hilum measuring 2.6 cm short axis dimension. Esophagus is unremarkable. Trachea and central bronchi are unremarkable. Lungs/Pleura: 9 mm pulmonary nodule within the anterior inferior portion of the RIGHT upper lobe (series 8, image 58). Subtle low-density nodule within the LEFT lower lobe measures 6 mm (series 8, image 75). Musculoskeletal: No acute or suspicious osseous finding. CT ABDOMEN PELVIS FINDINGS Hepatobiliary: No focal liver abnormality is seen. No gallstones, gallbladder wall thickening, or biliary dilatation. Pancreas: Unremarkable. No pancreatic ductal dilatation or surrounding inflammatory changes. Spleen: Marked splenomegaly. Adrenals/Urinary Tract: Adrenal glands appear normal. Kidneys are unremarkable without mass, stone or hydronephrosis. Bladder appears normal. Stomach/Bowel: No dilated large or small bowel loops. No evidence of bowel wall inflammation. Appendix is normal. Stomach is unremarkable, decompressed. Vascular/Lymphatic: No vascular abnormality. Bulky lymphadenopathy throughout the retroperitoneum, largest measurable lymph node in the LEFT periaortic  retroperitoneum measuring 1.8 cm short axis dimension (series 3, image 76). Bulky adenopathy within the bilateral iliac chain regions, including markedly enlarged lymph nodes just proximal to the inguinal canals bilaterally, on the LEFT measuring 6.3 x 3.5 cm. Additional bulky lymphadenopathy within the bilateral inguinal regions. Bulky lymphadenopathy throughout the mesentery, peripancreatic, portacaval and porta hepatis regions. Reproductive: Prostate is unremarkable. Other: No free fluid or abscess collection. No free intraperitoneal air. Musculoskeletal: No acute or suspicious osseous finding. Mild degenerative spondylosis of the lumbar spine. IMPRESSION: 1. Bulky lymphadenopathy throughout the chest, abdomen and pelvis, as detailed above, highly suspicious for lymphoma. 2. Marked splenomegaly. 3. 9 mm pulmonary nodule within the anterior inferior portion of the RIGHT upper lobe. Subtle low-density nodule within the LEFT lower lobe. These  are too small to definitively characterize. Neoplastic pulmonary nodules cannot be excluded. Consider PET-CT for further characterization. Electronically Signed   By: Franki Cabot M.D.   On: 04/08/2018 08:25   Ct Abdomen Pelvis W Contrast  Result Date: 04/08/2018 CLINICAL DATA:  Swollen lymph nodes, elevated WBCs. Unintentional weight loss. Workup for possible CLL. EXAM: CT CHEST, ABDOMEN, AND PELVIS WITH CONTRAST TECHNIQUE: Multidetector CT imaging of the chest, abdomen and pelvis was performed following the standard protocol during bolus administration of intravenous contrast. CONTRAST:  170m OMNIPAQUE IOHEXOL 300 MG/ML  SOLN COMPARISON:  None. FINDINGS: CT CHEST FINDINGS Cardiovascular: Heart size is normal. No pericardial effusion. No thoracic aortic aneurysm. No central obstructing pulmonary embolism. Mediastinum/Nodes: Bulky lymphadenopathy throughout the mediastinum and perihilar regions, largest measurable lymph node in the RIGHT hilum measuring 2.6 cm short axis  dimension. Esophagus is unremarkable. Trachea and central bronchi are unremarkable. Lungs/Pleura: 9 mm pulmonary nodule within the anterior inferior portion of the RIGHT upper lobe (series 8, image 58). Subtle low-density nodule within the LEFT lower lobe measures 6 mm (series 8, image 75). Musculoskeletal: No acute or suspicious osseous finding. CT ABDOMEN PELVIS FINDINGS Hepatobiliary: No focal liver abnormality is seen. No gallstones, gallbladder wall thickening, or biliary dilatation. Pancreas: Unremarkable. No pancreatic ductal dilatation or surrounding inflammatory changes. Spleen: Marked splenomegaly. Adrenals/Urinary Tract: Adrenal glands appear normal. Kidneys are unremarkable without mass, stone or hydronephrosis. Bladder appears normal. Stomach/Bowel: No dilated large or small bowel loops. No evidence of bowel wall inflammation. Appendix is normal. Stomach is unremarkable, decompressed. Vascular/Lymphatic: No vascular abnormality. Bulky lymphadenopathy throughout the retroperitoneum, largest measurable lymph node in the LEFT periaortic retroperitoneum measuring 1.8 cm short axis dimension (series 3, image 76). Bulky adenopathy within the bilateral iliac chain regions, including markedly enlarged lymph nodes just proximal to the inguinal canals bilaterally, on the LEFT measuring 6.3 x 3.5 cm. Additional bulky lymphadenopathy within the bilateral inguinal regions. Bulky lymphadenopathy throughout the mesentery, peripancreatic, portacaval and porta hepatis regions. Reproductive: Prostate is unremarkable. Other: No free fluid or abscess collection. No free intraperitoneal air. Musculoskeletal: No acute or suspicious osseous finding. Mild degenerative spondylosis of the lumbar spine. IMPRESSION: 1. Bulky lymphadenopathy throughout the chest, abdomen and pelvis, as detailed above, highly suspicious for lymphoma. 2. Marked splenomegaly. 3. 9 mm pulmonary nodule within the anterior inferior portion of the RIGHT  upper lobe. Subtle low-density nodule within the LEFT lower lobe. These are too small to definitively characterize. Neoplastic pulmonary nodules cannot be excluded. Consider PET-CT for further characterization. Electronically Signed   By: SFranki CabotM.D.   On: 04/08/2018 08:25

## 2018-05-01 LAB — HEPATITIS B SURFACE ANTIGEN: Hepatitis B Surface Ag: NEGATIVE

## 2018-05-03 ENCOUNTER — Encounter: Payer: Self-pay | Admitting: *Deleted

## 2018-05-10 ENCOUNTER — Other Ambulatory Visit: Payer: Self-pay | Admitting: Hematology

## 2018-05-11 ENCOUNTER — Other Ambulatory Visit: Payer: BLUE CROSS/BLUE SHIELD

## 2018-05-11 ENCOUNTER — Telehealth: Payer: Self-pay | Admitting: Pharmacist

## 2018-05-11 ENCOUNTER — Telehealth: Payer: Self-pay | Admitting: Hematology

## 2018-05-11 DIAGNOSIS — C9110 Chronic lymphocytic leukemia of B-cell type not having achieved remission: Secondary | ICD-10-CM

## 2018-05-11 MED ORDER — IBRUTINIB 140 MG PO CAPS: 420.0000 mg | ORAL_CAPSULE | Freq: Every day | ORAL | 0 refills | Status: DC

## 2018-05-11 NOTE — Telephone Encounter (Signed)
lmom to inform pt of lab/ov appt 05/13/2018 at 1030 am per 12/30 sch msg

## 2018-05-11 NOTE — Telephone Encounter (Signed)
Oral Chemotherapy Pharmacist Encounter  Ibrutinib will be shipped to [facility] today 05/11/18 with expected delivery 05/13/17.  Patient Education I spoke with patient for overview of new oral chemotherapy medication: Imbruvica (ibrutinib) for the treatment of CLL, planned duration until disease progression or unacceptable drug toxicity. 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  Counseled patient on administration, dosing, side effects, monitoring, drug-food interactions, safe handling, storage, and disposal. Patient will take 3 capsules (420 mg total) by mouth daily.  Side effects include but not limited to: N/V/D, decreased wbc/plt/hgb, fatigue.    Reviewed with patient importance of keeping a medication schedule and plan for any missed doses.  [facility] voiced understanding and appreciation. All questions answered. Medication handout placed in the mail.  Provided patient with [facility] phone number. Patient knows to call the office with questions or concerns. Oral Chemotherapy Navigation [facility] will continue to follow.  Darl Pikes, PharmD, BCPS, [facility] Hematology/Oncology Clinical Pharmacist [facility] [phone]  05/11/2018 12:35 PM

## 2018-05-13 ENCOUNTER — Inpatient Hospital Stay: Payer: BLUE CROSS/BLUE SHIELD | Attending: Hematology

## 2018-05-13 ENCOUNTER — Encounter: Payer: Self-pay | Admitting: *Deleted

## 2018-05-13 ENCOUNTER — Inpatient Hospital Stay (HOSPITAL_BASED_OUTPATIENT_CLINIC_OR_DEPARTMENT_OTHER): Payer: BLUE CROSS/BLUE SHIELD | Admitting: Hematology

## 2018-05-13 ENCOUNTER — Telehealth: Payer: Self-pay | Admitting: *Deleted

## 2018-05-13 ENCOUNTER — Encounter: Payer: Self-pay | Admitting: Hematology

## 2018-05-13 VITALS — BP 119/71 | HR 79 | Temp 98.5°F | Resp 18 | Ht 70.0 in | Wt 208.4 lb

## 2018-05-13 DIAGNOSIS — L270 Generalized skin eruption due to drugs and medicaments taken internally: Secondary | ICD-10-CM | POA: Insufficient documentation

## 2018-05-13 DIAGNOSIS — D649 Anemia, unspecified: Secondary | ICD-10-CM

## 2018-05-13 DIAGNOSIS — Z7952 Long term (current) use of systemic steroids: Secondary | ICD-10-CM | POA: Diagnosis not present

## 2018-05-13 DIAGNOSIS — D6959 Other secondary thrombocytopenia: Secondary | ICD-10-CM | POA: Diagnosis not present

## 2018-05-13 DIAGNOSIS — Z792 Long term (current) use of antibiotics: Secondary | ICD-10-CM | POA: Insufficient documentation

## 2018-05-13 DIAGNOSIS — M1712 Unilateral primary osteoarthritis, left knee: Secondary | ICD-10-CM | POA: Diagnosis not present

## 2018-05-13 DIAGNOSIS — C9110 Chronic lymphocytic leukemia of B-cell type not having achieved remission: Secondary | ICD-10-CM | POA: Insufficient documentation

## 2018-05-13 DIAGNOSIS — R161 Splenomegaly, not elsewhere classified: Secondary | ICD-10-CM | POA: Diagnosis not present

## 2018-05-13 DIAGNOSIS — Z79899 Other long term (current) drug therapy: Secondary | ICD-10-CM

## 2018-05-13 DIAGNOSIS — Z880 Allergy status to penicillin: Secondary | ICD-10-CM | POA: Diagnosis not present

## 2018-05-13 DIAGNOSIS — Z881 Allergy status to other antibiotic agents status: Secondary | ICD-10-CM | POA: Insufficient documentation

## 2018-05-13 DIAGNOSIS — D696 Thrombocytopenia, unspecified: Secondary | ICD-10-CM

## 2018-05-13 DIAGNOSIS — N179 Acute kidney failure, unspecified: Secondary | ICD-10-CM | POA: Insufficient documentation

## 2018-05-13 DIAGNOSIS — R600 Localized edema: Secondary | ICD-10-CM | POA: Diagnosis not present

## 2018-05-13 LAB — CMP (CANCER CENTER ONLY)
ALT: 12 U/L (ref 0–44)
AST: 18 U/L (ref 15–41)
Albumin: 4.4 g/dL (ref 3.5–5.0)
Alkaline Phosphatase: 124 U/L (ref 38–126)
Anion gap: 6 (ref 5–15)
BUN: 15 mg/dL (ref 6–20)
CO2: 25 mmol/L (ref 22–32)
Calcium: 9 mg/dL (ref 8.9–10.3)
Chloride: 107 mmol/L (ref 98–111)
Creatinine: 1.07 mg/dL (ref 0.61–1.24)
GFR, Est AFR Am: >60 mL/min (ref >60)
GFR, Estimated: >60 mL/min (ref >60)
Glucose, Bld: 97 mg/dL (ref 70–99)
Potassium: 4.4 mmol/L (ref 3.5–5.1)
Sodium: 138 mmol/L (ref 135–145)
Total Bilirubin: 0.5 mg/dL (ref 0.3–1.2)
Total Protein: 6.3 g/dL — ABNORMAL LOW (ref 6.5–8.1)

## 2018-05-13 LAB — CBC WITH DIFFERENTIAL (CANCER CENTER ONLY)
Abs Immature Granulocytes: 0.1 10*3/uL — ABNORMAL HIGH (ref 0.00–0.07)
Basophils Absolute: 0.2 10*3/uL — ABNORMAL HIGH (ref 0.0–0.1)
Basophils Relative: 0 %
Eosinophils Absolute: 0.1 10*3/uL (ref 0.0–0.5)
Eosinophils Relative: 0 %
HCT: 32.9 % — ABNORMAL LOW (ref 39.0–52.0)
Hemoglobin: 9.6 g/dL — ABNORMAL LOW (ref 13.0–17.0)
Immature Granulocytes: 0 %
Lymphocytes Relative: 84 %
Lymphs Abs: 117.7 10*3/uL — ABNORMAL HIGH (ref 0.7–4.0)
MCH: 25.7 pg — ABNORMAL LOW (ref 26.0–34.0)
MCHC: 29.2 g/dL — ABNORMAL LOW (ref 30.0–36.0)
MCV: 88 fL (ref 80.0–100.0)
Monocytes Absolute: 21 10*3/uL — ABNORMAL HIGH (ref 0.1–1.0)
Monocytes Relative: 15 %
Neutro Abs: 1.8 10*3/uL (ref 1.7–7.7)
Neutrophils Relative %: 1 %
Platelet Count: 55 10*3/uL — ABNORMAL LOW (ref 150–400)
RBC: 3.74 MIL/uL — ABNORMAL LOW (ref 4.22–5.81)
RDW: 16.8 % — ABNORMAL HIGH (ref 11.5–15.5)
WBC Count: 140.8 10*3/uL (ref 4.0–10.5)
nRBC: 0 % (ref 0.0–0.2)

## 2018-05-13 LAB — URIC ACID: Uric Acid, Serum: 4.6 mg/dL (ref 3.7–8.6)

## 2018-05-13 LAB — LACTATE DEHYDROGENASE: LDH: 503 U/L — ABNORMAL HIGH (ref 98–192)

## 2018-05-13 NOTE — Telephone Encounter (Signed)
Critical Value WBC 140.8 Dr Maylon Peppers notified. No orders at this time.

## 2018-05-13 NOTE — Progress Notes (Signed)
Met with patient today during his follow up appointment. All his testing has come back and patient will proceed with ibrutinib once he receives the medication tomorrow morning.  Had a lengthy conversation with him regarding medication, expected side effects, and when he needed to call the office. Answered questions about his blood work and possible treatments if his counts were to drop. He's slightly worried about his platelets as he works in [employer]. Reviewed thrombocytopenic precautions if his platelets were to drop lower.  Patient appreciative of education. He will reach back out, if after speaking to Dr Maylon Peppers he has any further questions.

## 2018-05-13 NOTE — Progress Notes (Signed)
[facility] OFFICE PROGRESS NOTE  Patient Care Team: [name], MD as PCP - General (Family Medicine)  HEME/ONC OVERVIEW: 1. CLL with bulky lymphadenopathy; RAI Stage IV, intermediate risk -03/2018:   -PB flow showed CD5+, CD23+ monoclonal B-cells, c/w CLL; IgHV mutation negative  -FISH (PB) positive for PIR(51O84); negative t(11;14), p53 deletion/amplification, and ATM (11q22.3) deletion  -CT neck, CAP showed bulky lymphadenopathy in bilateral cervical LN's and throughout the chest, abdomen and pelvis; marked splenomegaly -04/2018: R axillary excision LN biopsy showed CLL/SLL (cyclin D1 neg) -05/2017 - present: ibrutinib  2. Normocytic anemia 3. Thrombocytopenia   TREATMENT REGIMEN:  Ibrutinib; 05/14/2018 - present   ASSESSMENT & PLAN:   CLL with diffuse lymphadenopathy; RAI Stage IV, intermediate risk  -Peripheral blood flow cytometry and excisional LN biopsy confirmed CLL; IgHV mutation negative  -I reviewed the NCCN guideline with the patient in detail -Given the negative IgHV mutation, the recommended 1st treatment regimen is ibrutinib  -I discussed with patient some of the benefits and potential adverse reactions  -The most frequent nonhematologic adverse events were diarrhea, fatigue, pyrexia, and nausea -Some of the short term side-effects included, though not limited to, risk of fatigue, weight loss, tumor lysis syndrome, risk of allergic reactions, pancytopenia, bruising, diarrhea, transient leukocytosis, life-threatening infections, need for transfusions of blood products, irregular heart beat, nausea, vomiting, change in bowel habits, admission to hospital for various reasons, and risks of death -The patient is aware that the response rates discussed earlier is not guaranteed -After a long discussion, patient made an informed decision to proceed with the prescribed plan of care -On allopurinol for TLS, given the bulky lymphadenopathy  -VZV and PJP ppx:  acyclovir and Bactrim -PRN supportive medications: loperamide, Zofran   Normocytic anemia -Hgb ~10 -Coombs negative, T bili normal, not suggestive of hemolysis -We will monitor it for now   Thrombocytopenia -Plts ~50k -Patient denies any major bleeding or bruising except one episode of epistaxis  -We will monitor it for now  Vaccination -Up-to-date with influenza vaccination    Orders Placed This Encounter  Procedures  . CBC with Differential (Cancer Center Only)    Standing Status:   Standing    Number of Occurrences:   20    Standing Expiration Date:   05/14/2019  . CMP ([facility] only)    Standing Status:   Standing    Number of Occurrences:   20    Standing Expiration Date:   05/14/2019  . Lactate dehydrogenase    Standing Status:   Standing    Number of Occurrences:   20    Standing Expiration Date:   05/14/2019  . Uric acid    Standing Status:   Standing    Number of Occurrences:   20    Standing Expiration Date:   05/14/2019   All questions were answered. The patient knows to call the clinic with any problems, questions or concerns. No barriers to learning was detected.  A total of more than 25 minutes were spent face-to-face with the patient during this encounter and over half of that time was spent on counseling and coordination of care as outlined above.   Return in 1 week for labs and clinic follow-up.   [name], MD 05/13/2018 1:08 PM  CHIEF COMPLAINT: "I am doing okay"  INTERVAL HISTORY: David Whitehead returns to clinic for follow-up of CLL.  He reports that over the holiday, he felt cervical lymph nodes have gotten slightly larger.  He  had a one episode of epistaxis, which resolved was OTC Afrin.  He denies any other symptoms of bleeding, such as gum bleeding, hemoptysis, hematemesis, hematochezia, melena, or hematuria.  He spoke over the phone at length with oral chemotherapy pharmacist Alyson regarding the benefits and potential side effects of ibrutinib,  which is scheduled to be delivered on 05/14/2018.  He denies any other complaint today.  SUMMARY OF ONCOLOGIC HISTORY:   CLL (chronic lymphocytic leukemia) ([place])   04/01/2018 Initial Diagnosis    CLL (chronic lymphocytic leukemia) ([place])    04/05/2018 Pathology Results    Peripheral Blood Flow Cytometry - MONOCLONAL B-CELL POPULATION IDENTIFIED. - SEE NOTE.    04/05/2018 Pathology Results    PB FISH: No evidence of CCND1-IGH [t(11;14)] gene rearrangement. No evidence of trisomy 11 or gain of 11q. (excludes mantle cell lymphoma) No evidence of trisomy 12.  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 No evidence of p53 (17p13( deletion or amplification. No evidence of ATM (11q22.3) deletion.     04/07/2018 Imaging    CT neck w/ contrast: IMPRESSION: Bulky bilateral cervical lymphadenopathy consistent with lymphoproliferative disease/lymphoma.    04/07/2018 Imaging    CT CAP w/ contrast: IMPRESSION: 1. Bulky lymphadenopathy throughout the chest, abdomen and pelvis, as detailed above, highly suspicious for lymphoma. 2. Marked splenomegaly. 3. 9 mm pulmonary nodule within the anterior inferior portion of the RIGHT upper lobe. Subtle low-density nodule within the LEFT lower lobe. These are too small to definitively characterize. Neoplastic pulmonary nodules cannot be excluded. Consider PET-CT for further characterization.    04/16/2018 Surgery    Right axillary LN excisional biopsy    04/16/2018 Pathology Results    (Accession: [id number]) Lymph node for lymphoma, Right Axillary - CHRONIC LYMPHOCYTIC LEUKEMIA/SMALL LYMPHOCYTIC LYMPHOMA.     REVIEW OF SYSTEMS:   Constitutional: ( - ) fevers, ( - )  chills , ( - ) night sweats Eyes: ( - ) blurriness of vision, ( - ) double vision, ( - ) watery eyes Ears, nose, mouth, throat, and face: ( - ) mucositis, ( - ) sore throat Respiratory: ( - ) cough, ( - ) dyspnea, ( - ) wheezes Cardiovascular: ( - ) palpitation, ( -  ) chest discomfort, ( - ) lower extremity swelling Gastrointestinal:  ( - ) nausea, ( - ) heartburn, ( - ) change in bowel habits Skin: ( - ) abnormal skin rashes Lymphatics: ( + ) lymphadenopathy, ( - ) easy bruising Neurological: ( - ) numbness, ( - ) tingling, ( - ) new weaknesses Behavioral/Psych: ( - ) mood change, ( - ) new changes  All other systems were reviewed with the patient and are negative.  I have reviewed the past medical history, past surgical history, social history and family history with the patient and they are unchanged from previous note.  ALLERGIES:  is allergic to amoxicillin.  MEDICATIONS:  Current Outpatient Medications  Medication Sig Dispense Refill  . acyclovir (ZOVIRAX) 400 MG tablet Take 1 tablet (400 mg total) by mouth 2 (two) times daily. 180 tablet 3  . allopurinol (ZYLOPRIM) 300 MG tablet Take 1 tablet (300 mg total) by mouth daily. 90 tablet 3  . HYDROcodone-acetaminophen (NORCO) 5-325 MG tablet Take 1-2 tablets by mouth every 6 (six) hours as needed for moderate pain or severe pain. (Patient not taking: Reported on 04/30/2018) 20 tablet 0  . ibrutinib (IMBRUVICA) 140 MG capsul Take 3 capsules (420 mg total) by mouth daily. 90 capsule 0  .  sulfamethoxazole-trimethoprim (BACTRIM DS,SEPTRA DS) 800-160 MG tablet Take 1 tablet by mouth 3 (three) times a week. (Patient not taking: Reported on 04/30/2018) 36 tablet 3   No current facility-administered medications for this visit.     PHYSICAL EXAMINATION: ECOG PERFORMANCE STATUS: 0 - Asymptomatic  Today's Vitals   05/13/18 1044  BP: 119/71  Pulse: 79  Resp: 18  Temp: 98.5 F (36.9 C)  TempSrc: Oral  SpO2: 100%  Weight: 208 lb 6.4 oz (94.5 kg)  Height: '5\' 10"'  (1.778 m)  PainSc: 0-No pain   Body mass index is 29.9 kg/m.  Filed Weights   05/13/18 1044  Weight: 208 lb 6.4 oz (94.5 kg)    GENERAL: alert, no distress and comfortable SKIN: skin color, texture, turgor are normal, no rashes or  significant lesions EYES: conjunctiva are pink and non-injected, sclera clear OROPHARYNX: no exudate, no erythema; lips, buccal mucosa, and tongue normal  LYMPH:  Palpable multiple posterior occipital LN's (~1-2cm), bulky cervical and supraclavicular LN's bilaterally LUNGS: clear to auscultation and percussion with normal breathing effort HEART: regular rate & rhythm, no murmurs, no lower extremity edema ABDOMEN: soft, non-tender, non-distended, normal bowel sounds, no liver or spleen enlargement palpated  Musculoskeletal: no cyanosis of digits and no clubbing; a small nodule palpated in the left popliteal fossa, no calf tenderness or swelling in either lower extremity  PSYCH: alert & oriented x 3, fluent speech NEURO: no focal motor/sensory deficits  LABORATORY DATA:  I have reviewed the data as listed    Component Value Date/Time   NA 138 05/13/2018 1032   NA 142 03/31/2018 1649   K 4.4 05/13/2018 1032   CL 107 05/13/2018 1032   CO2 25 05/13/2018 1032   GLUCOSE 97 05/13/2018 1032   BUN 15 05/13/2018 1032   BUN 16 03/31/2018 1649   CREATININE 1.07 05/13/2018 1032   CALCIUM 9.0 05/13/2018 1032   PROT 6.3 (L) 05/13/2018 1032   PROT 5.9 (L) 03/31/2018 1649   ALBUMIN 4.4 05/13/2018 1032   ALBUMIN 4.6 03/31/2018 1649   AST 18 05/13/2018 1032   ALT 12 05/13/2018 1032   ALKPHOS 124 05/13/2018 1032   BILITOT 0.5 05/13/2018 1032   GFRNONAA >60 05/13/2018 1032   GFRAA >60 05/13/2018 1032    No results found for: SPEP, UPEP  Lab Results  Component Value Date   WBC 140.8 (HH) 05/13/2018   NEUTROABS 1.8 05/13/2018   HGB 9.6 (L) 05/13/2018   HCT 32.9 (L) 05/13/2018   MCV 88.0 05/13/2018   PLT 55 (L) 05/13/2018      Chemistry      Component Value Date/Time   NA 138 05/13/2018 1032   NA 142 03/31/2018 1649   K 4.4 05/13/2018 1032   CL 107 05/13/2018 1032   CO2 25 05/13/2018 1032   BUN 15 05/13/2018 1032   BUN 16 03/31/2018 1649   CREATININE 1.07 05/13/2018 1032       Component Value Date/Time   CALCIUM 9.0 05/13/2018 1032   ALKPHOS 124 05/13/2018 1032   AST 18 05/13/2018 1032   ALT 12 05/13/2018 1032   BILITOT 0.5 05/13/2018 1032       RADIOGRAPHIC STUDIES: I have personally reviewed the radiological images as listed below and agreed with the findings in the report. US Venous Img Lower Unilateral Left  Result Date: 04/30/2018 CLINICAL DATA:  Acute left leg pain EXAM: LEFT LOWER EXTREMITY VENOUS DOPPLER ULTRASOUND TECHNIQUE: Gray-scale sonography with graded compression, as well as color Doppler and duplex  ultrasound were performed to evaluate the lower extremity deep venous systems from the level of the common femoral vein and including the common femoral, femoral, profunda femoral, popliteal and calf veins including the posterior tibial, peroneal and gastrocnemius veins when visible. The superficial great saphenous vein was also interrogated. Spectral Doppler was utilized to evaluate flow at rest and with distal augmentation maneuvers in the common femoral, femoral and popliteal veins. COMPARISON:  None. FINDINGS: Contralateral Common Femoral Vein: Respiratory phasicity is normal and symmetric with the symptomatic side. No evidence of thrombus. Normal compressibility. Common Femoral Vein: No evidence of thrombus. Normal compressibility, respiratory phasicity and response to augmentation. Saphenofemoral Junction: No evidence of thrombus. Normal compressibility and flow on color Doppler imaging. Profunda Femoral Vein: No evidence of thrombus. Normal compressibility and flow on color Doppler imaging. Femoral Vein: No evidence of thrombus. Normal compressibility, respiratory phasicity and response to augmentation. Popliteal Vein: No evidence of thrombus. Normal compressibility, respiratory phasicity and response to augmentation. Calf Veins: No evidence of thrombus. Normal compressibility and flow on color Doppler imaging. Superficial Great Saphenous Vein: No  evidence of thrombus. Normal compressibility. Venous Reflux:  None. Other Findings:  5.9 x 0.9 x 4.0 cm popliteal cyst is noted. IMPRESSION: No evidence of deep venous thrombosis. Left popliteal cyst. Electronically Signed   By: Inez Catalina M.D.   On: 04/30/2018 15:46

## 2018-05-17 ENCOUNTER — Other Ambulatory Visit: Payer: BLUE CROSS/BLUE SHIELD

## 2018-05-17 ENCOUNTER — Ambulatory Visit: Payer: BLUE CROSS/BLUE SHIELD | Admitting: Hematology

## 2018-05-17 ENCOUNTER — Ambulatory Visit: Payer: BLUE CROSS/BLUE SHIELD

## 2018-05-18 ENCOUNTER — Ambulatory Visit: Payer: BLUE CROSS/BLUE SHIELD

## 2018-05-19 ENCOUNTER — Other Ambulatory Visit: Payer: Self-pay | Admitting: *Deleted

## 2018-05-19 ENCOUNTER — Encounter: Payer: Self-pay | Admitting: Hematology

## 2018-05-19 ENCOUNTER — Other Ambulatory Visit: Payer: Self-pay | Admitting: Hematology

## 2018-05-19 ENCOUNTER — Ambulatory Visit: Payer: BLUE CROSS/BLUE SHIELD

## 2018-05-19 DIAGNOSIS — M7122 Synovial cyst of popliteal space [Baker], left knee: Secondary | ICD-10-CM

## 2018-05-20 ENCOUNTER — Ambulatory Visit (INDEPENDENT_AMBULATORY_CARE_PROVIDER_SITE_OTHER): Payer: Self-pay

## 2018-05-20 ENCOUNTER — Encounter (INDEPENDENT_AMBULATORY_CARE_PROVIDER_SITE_OTHER): Payer: Self-pay | Admitting: Orthopaedic Surgery

## 2018-05-20 ENCOUNTER — Ambulatory Visit (INDEPENDENT_AMBULATORY_CARE_PROVIDER_SITE_OTHER): Payer: BLUE CROSS/BLUE SHIELD | Admitting: Orthopaedic Surgery

## 2018-05-20 VITALS — BP 118/77 | HR 73 | Ht 70.0 in | Wt 208.0 lb

## 2018-05-20 DIAGNOSIS — M25562 Pain in left knee: Secondary | ICD-10-CM

## 2018-05-20 NOTE — Progress Notes (Signed)
Office Visit Note   [name]           Date of Birth: [birth year]           MRN: [id number] Visit Date: 05/20/2018              Requested by: [name], MD [place], [place] [id number] PCP: [name], MD   Assessment & Plan: Visit Diagnoses:  1. Acute pain of left knee     Plan: Come back in 3 weeks.  If he has persistent problems with his knee will consider cortisone injection in his knee at that time.  He can continue intermittent ice use the Tylenol for pain if needed.  He needs to avoid anti-inflammatories with his low platelet count.  Follow-Up Instructions: Return in about 3 weeks (around 06/10/2018).   Orders:  Orders Placed This Encounter  Procedures  . XR KNEE 3 VIEW LEFT   No orders of the defined types were placed in this encounter.     Procedures: No procedures performed   Clinical Data: No additional findings.   Subjective: Chief Complaint  Patient presents with  . Left Knee - Pain    HPI 48 year old male recent diagnosis of CLL now currently on  Imbruvica with platelet count 50 K.  He has had problems with his left knee with pain swelling in the popliteal region.  Venous Doppler 04/30/2018 showed no DVT but a 6 x 4 x 1 cm popliteal cyst.  He has had past history of patellar dislocation last time in the 1990s.  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  He denies locking.  No pain in his groin associated back pain.  He is used Tylenol with some improvement.  He states today is actually been somewhat better.  Review of Systems positive for hyperuricemia on allopurinol.  Positive for CLL diagnosed 1 month ago.  Positive for sleep apnea thrombocytopenia related to CLL treatment medication.  Otherwise negative as pertains HPI.   Objective: Vital Signs: BP 118/77   Pulse 73   Ht 5\' 10"  (1.778 m)   Wt 208 lb (94.3 kg)   BMI 29.84 kg/m   Physical Exam Constitutional:      Appearance: He is  well-developed.  HENT:     Head: Normocephalic and atraumatic.  Eyes:     Pupils: Pupils are equal, round, and reactive to light.  Neck:     Thyroid: No thyromegaly.     Trachea: No tracheal deviation.  Cardiovascular:     Rate and Rhythm: Normal rate.  Pulmonary:     Effort: Pulmonary effort is normal.     Breath sounds: No wheezing.  Abdominal:     General: Bowel sounds are normal.     Palpations: Abdomen is soft.  Skin:    General: Skin is warm and dry.     Capillary Refill: Capillary refill takes less than 2 seconds.  Neurological:     Mental Status: He is alert and oriented to person, place, and time.  Psychiatric:        Behavior: Behavior normal.        Thought Content: Thought content normal.        Judgment: Judgment normal.     Ortho Exam patient has has prominent lymph nodes in the neck.  Knee reaches full extension mild knee effusion on the left negative on the right.  ACL PCL exam is normal.  Some  prominence in the popliteal region.  Mild joint line tenderness some pain with patellofemoral loading and quadriceps contracture.  Mild crepitus.  No pain with hyperextension.  Distal pulses are 2+ no calf tenderness.  No sciatic notch tenderness.  He is able heel and toe walk easily.  Specialty Comments:  No specialty comments available.  Imaging: No results found.   PMFS History: Patient Active Problem List   Diagnosis Date Noted  . CLL (chronic lymphocytic leukemia) ([facility]) 04/01/2018  . Normocytic anemia 04/01/2018  . Thrombocytopenia ([facility]) 04/01/2018  . Dry mouth 03/31/2018  . Sleep apnea in adult 03/31/2018   Past Medical History:  Diagnosis Date  . Leukocytosis 04/01/2018  . Sleep apnea    uses a cpap    No family history on file.  Past Surgical History:  Procedure Laterality Date  . AXILLARY LYMPH NODE DISSECTION Right 04/16/2018   Procedure: AXILLARY LYMPH NODE DEEP EXCISION;  Surgeon: Fanny Skates, MD;  Location: [facility];  Service: General;   Laterality: Right;  . FINGER SURGERY     Social History   Occupational History  . Not on file  Tobacco Use  . Smoking status: Never Smoker  . Smokeless tobacco: Never Used  Substance and Sexual Activity  . Alcohol use: Not Currently  . Drug use: Never  . Sexual activity: Not on file

## 2018-05-21 ENCOUNTER — Encounter: Payer: Self-pay | Admitting: Hematology

## 2018-05-21 ENCOUNTER — Other Ambulatory Visit: Payer: Self-pay

## 2018-05-21 ENCOUNTER — Encounter: Payer: Self-pay | Admitting: *Deleted

## 2018-05-21 ENCOUNTER — Inpatient Hospital Stay: Payer: BLUE CROSS/BLUE SHIELD

## 2018-05-21 ENCOUNTER — Inpatient Hospital Stay (HOSPITAL_BASED_OUTPATIENT_CLINIC_OR_DEPARTMENT_OTHER): Payer: BLUE CROSS/BLUE SHIELD | Admitting: Hematology

## 2018-05-21 DIAGNOSIS — Z881 Allergy status to other antibiotic agents status: Secondary | ICD-10-CM | POA: Diagnosis not present

## 2018-05-21 DIAGNOSIS — D649 Anemia, unspecified: Secondary | ICD-10-CM

## 2018-05-21 DIAGNOSIS — Z880 Allergy status to penicillin: Secondary | ICD-10-CM | POA: Diagnosis not present

## 2018-05-21 DIAGNOSIS — C9110 Chronic lymphocytic leukemia of B-cell type not having achieved remission: Secondary | ICD-10-CM

## 2018-05-21 DIAGNOSIS — Z792 Long term (current) use of antibiotics: Secondary | ICD-10-CM | POA: Diagnosis not present

## 2018-05-21 DIAGNOSIS — R600 Localized edema: Secondary | ICD-10-CM | POA: Diagnosis not present

## 2018-05-21 DIAGNOSIS — Z79899 Other long term (current) drug therapy: Secondary | ICD-10-CM | POA: Diagnosis not present

## 2018-05-21 DIAGNOSIS — L299 Pruritus, unspecified: Secondary | ICD-10-CM

## 2018-05-21 DIAGNOSIS — Z7952 Long term (current) use of systemic steroids: Secondary | ICD-10-CM | POA: Diagnosis not present

## 2018-05-21 DIAGNOSIS — R161 Splenomegaly, not elsewhere classified: Secondary | ICD-10-CM | POA: Diagnosis not present

## 2018-05-21 DIAGNOSIS — M1990 Unspecified osteoarthritis, unspecified site: Secondary | ICD-10-CM

## 2018-05-21 DIAGNOSIS — M7122 Synovial cyst of popliteal space [Baker], left knee: Secondary | ICD-10-CM

## 2018-05-21 DIAGNOSIS — M1712 Unilateral primary osteoarthritis, left knee: Secondary | ICD-10-CM | POA: Diagnosis not present

## 2018-05-21 DIAGNOSIS — D6959 Other secondary thrombocytopenia: Secondary | ICD-10-CM | POA: Diagnosis not present

## 2018-05-21 DIAGNOSIS — N179 Acute kidney failure, unspecified: Secondary | ICD-10-CM | POA: Diagnosis not present

## 2018-05-21 DIAGNOSIS — L270 Generalized skin eruption due to drugs and medicaments taken internally: Secondary | ICD-10-CM | POA: Diagnosis not present

## 2018-05-21 DIAGNOSIS — D696 Thrombocytopenia, unspecified: Secondary | ICD-10-CM

## 2018-05-21 LAB — CMP (CANCER CENTER ONLY)
ALT: 9 U/L (ref 0–44)
AST: 12 U/L — ABNORMAL LOW (ref 15–41)
Albumin: 4.5 g/dL (ref 3.5–5.0)
Alkaline Phosphatase: 118 U/L (ref 38–126)
Anion gap: 7 (ref 5–15)
BUN: 20 mg/dL (ref 6–20)
CO2: 31 mmol/L (ref 22–32)
Calcium: 9.5 mg/dL (ref 8.9–10.3)
Chloride: 103 mmol/L (ref 98–111)
Creatinine: 1.16 mg/dL (ref 0.61–1.24)
GFR, Est AFR Am: >60 mL/min (ref >60)
GFR, Estimated: >60 mL/min (ref >60)
Glucose, Bld: 94 mg/dL (ref 70–99)
Potassium: 4.2 mmol/L (ref 3.5–5.1)
Sodium: 141 mmol/L (ref 135–145)
Total Bilirubin: 0.4 mg/dL (ref 0.3–1.2)
Total Protein: 6.1 g/dL — ABNORMAL LOW (ref 6.5–8.1)

## 2018-05-21 LAB — CBC WITH DIFFERENTIAL (CANCER CENTER ONLY)
Abs Immature Granulocytes: 0.29 10*3/uL — ABNORMAL HIGH (ref 0.00–0.07)
Basophils Absolute: 0.2 10*3/uL — ABNORMAL HIGH (ref 0.0–0.1)
Basophils Relative: 0 %
Eosinophils Absolute: 0.1 10*3/uL (ref 0.0–0.5)
Eosinophils Relative: 0 %
HCT: 35.7 % — ABNORMAL LOW (ref 39.0–52.0)
Hemoglobin: 9.4 g/dL — ABNORMAL LOW (ref 13.0–17.0)
Immature Granulocytes: 0 %
Lymphocytes Relative: 94 %
Lymphs Abs: 272.8 10*3/uL — ABNORMAL HIGH (ref 0.7–4.0)
MCH: 24 pg — ABNORMAL LOW (ref 26.0–34.0)
MCHC: 26.3 g/dL — ABNORMAL LOW (ref 30.0–36.0)
MCV: 91.1 fL (ref 80.0–100.0)
Monocytes Absolute: 13.9 10*3/uL — ABNORMAL HIGH (ref 0.1–1.0)
Monocytes Relative: 5 %
Neutro Abs: 3 10*3/uL (ref 1.7–7.7)
Neutrophils Relative %: 1 %
Platelet Count: 159 10*3/uL (ref 150–400)
RBC: 3.92 MIL/uL — ABNORMAL LOW (ref 4.22–5.81)
RDW: 16.9 % — ABNORMAL HIGH (ref 11.5–15.5)
WBC Count: 290.2 10*3/uL (ref 4.0–10.5)
nRBC: 0 % (ref 0.0–0.2)

## 2018-05-21 LAB — URIC ACID: Uric Acid, Serum: 6 mg/dL (ref 3.7–8.6)

## 2018-05-21 MED ORDER — HYDROXYZINE HCL 10 MG PO TABS: 10.0000 mg | ORAL_TABLET | Freq: Three times a day (TID) | ORAL | 2 refills | Status: DC | PRN

## 2018-05-21 NOTE — Progress Notes (Signed)
[place] OFFICE PROGRESS NOTE  Patient Care Team: David Drown, MD as PCP - General (Family Medicine)  HEME/ONC OVERVIEW: 1. CLL with bulky lymphadenopathy; RAI Stage IV, intermediate risk -03/2018:   -PB flow showed CD5+, CD23+ monoclonal B-cells, c/w CLL; IgHV mutation negative  -FISH (PB) positive for JQB(34L93); negative t(11;14), p53 deletion/amplification, and ATM (11q22.3) deletion  -CT neck, CAP showed bulky lymphadenopathy in bilateral cervical LN's and throughout the chest, abdomen and pelvis; marked splenomegaly -04/2018: R axillary excision LN biopsy showed CLL/SLL (cyclin D1 neg) -05/2017 - present: ibrutinib  2. Normocytic anemia 3. Thrombocytopenia (resolved)  TREATMENT REGIMEN:  Ibrutinib; 05/14/2018 - present   ASSESSMENT & PLAN:   CLL with diffuse lymphadenopathy; RAI Stage IV, intermediate risk  -Patient started ibrutinib on 05/14/2018, and is tolerated well so far without significant side effects -His lymphadenopathy and splenomegaly have improved markedly over the past week -WBC 290k today, which is expected with starting ibrutinib; WBC count will downtrend over the next few weeks -On allopurinol for TLS, given the bulky lymphadenopathy  -VZV and PJP ppx: acyclovir and Bactrim -PRN supportive medications: loperamide, Zofran  -I counseled the patient on any concerning symptoms, including fever, palpitation, dizziness, chest pain, or abnormal bleeding/bruising, for which he should contact the clinic promptly  Normocytic anemia -Hgb 9.4 today, stable  -Coombs negative, T bili normal, not suggestive of hemolysis -We will monitor it for now   Thrombocytopenia, resolved  -Plts 154k today, normalized -Patient denies any major bleeding or bruising except one episode of epistaxis  -We will monitor it for now  Generalized pruritus -Possibly due to ibrutinib side effect -No rash on exam -I have prescribed PRN hydroxyzine  -Patient is instructed  to call the clinic if he develops any significant rash  Left knee pain, Baker's cyst -Due to persistent left knee pain despite supportive care, patient was referred to orthopedic surgery, who recommended continue supportive care for now due to recent thrombocytopenia -However, if his pain persists and his platelet count improves, cortisone injection maybe considered  Orders Placed This Encounter  Procedures  . CBC with Differential (Cancer Center Only)    Standing Status:   Future    Standing Expiration Date:   06/25/2019  . CMP ([facility] only)    Standing Status:   Future    Standing Expiration Date:   06/25/2019  . Lactate dehydrogenase    Standing Status:   Future    Standing Expiration Date:   06/25/2019  . Uric acid    Standing Status:   Future    Standing Expiration Date:   06/25/2019   All questions were answered. The patient knows to call the clinic with any problems, questions or concerns. No barriers to learning was detected.  A total of more than 25 minutes were spent face-to-face with the patient during this encounter and over half of that time was spent on counseling and coordination of care as outlined above.   Return in 2 weeks for labs and clinic follow-up.   [name], MD 05/21/2018 3:24 PM  CHIEF COMPLAINT: "I am doing much better"  INTERVAL HISTORY: David Whitehead returns to clinic for follow-up of CLL.  He started ibrutinib on 05/14/2018.  He reports that since starting medication 1 week ago, his cervical lymphadenopathy has improved significantly.  Furthermore, he felt his spleen size has also reduced in size, and he is no longer having abdominal fullness.  He reports new onset generalized pruritus without any rash, for which  she spoke to the oral chemotherapy pharmacist over the phone, who recommended OTC Benadryl with some relief.  He denies any fever, chill, night sweats, weight change, chest pain, dyspnea, palpitation, abdominal pain, nausea, vomiting, diarrhea,  or abnormal bleeding/bruising.  SUMMARY OF ONCOLOGIC HISTORY:   CLL (chronic lymphocytic leukemia) ([place])   04/01/2018 Initial Diagnosis    CLL (chronic lymphocytic leukemia) ([place])    04/05/2018 Pathology Results    Peripheral Blood Flow Cytometry - MONOCLONAL B-CELL POPULATION IDENTIFIED. - SEE NOTE.    04/05/2018 Pathology Results    PB FISH: No evidence of CCND1-IGH [t(11;14)] gene rearrangement. No evidence of trisomy 11 or gain of 11q. (excludes mantle cell lymphoma) No evidence of trisomy 12.  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 No evidence of p53 (17p13( deletion or amplification. No evidence of ATM (11q22.3) deletion.     04/07/2018 Imaging    CT neck w/ contrast: IMPRESSION: Bulky bilateral cervical lymphadenopathy consistent with lymphoproliferative disease/lymphoma.    04/07/2018 Imaging    CT CAP w/ contrast: IMPRESSION: 1. Bulky lymphadenopathy throughout the chest, abdomen and pelvis, as detailed above, highly suspicious for lymphoma. 2. Marked splenomegaly. 3. 9 mm pulmonary nodule within the anterior inferior portion of the RIGHT upper lobe. Subtle low-density nodule within the LEFT lower lobe. These are too small to definitively characterize. Neoplastic pulmonary nodules cannot be excluded. Consider PET-CT for further characterization.    04/16/2018 Surgery    Right axillary LN excisional biopsy    04/16/2018 Pathology Results    (Accession: [id number]) Lymph node for lymphoma, Right Axillary - CHRONIC LYMPHOCYTIC LEUKEMIA/SMALL LYMPHOCYTIC LYMPHOMA.     REVIEW OF SYSTEMS:   Constitutional: ( - ) fevers, ( - )  chills , ( - ) night sweats Eyes: ( - ) blurriness of vision, ( - ) double vision, ( - ) watery eyes Ears, nose, mouth, throat, and face: ( - ) mucositis, ( - ) sore throat Respiratory: ( - ) cough, ( - ) dyspnea, ( - ) wheezes Cardiovascular: ( - ) palpitation, ( - ) chest discomfort, ( - ) lower extremity  swelling Gastrointestinal:  ( - ) nausea, ( - ) heartburn, ( - ) change in bowel habits Skin: ( - ) abnormal skin rashes Lymphatics: ( - ) new lymphadenopathy, ( - ) easy bruising Neurological: ( - ) numbness, ( - ) tingling, ( - ) new weaknesses Behavioral/Psych: ( - ) mood change, ( - ) new changes  All other systems were reviewed with the patient and are negative.  I have reviewed the past medical history, past surgical history, social history and family history with the patient and they are unchanged from previous note.  ALLERGIES:  is allergic to amoxicillin.  MEDICATIONS:  Current Outpatient Medications  Medication Sig Dispense Refill  . acyclovir (ZOVIRAX) 400 MG tablet Take 1 tablet (400 mg total) by mouth 2 (two) times daily. 180 tablet 3  . allopurinol (ZYLOPRIM) 300 MG tablet Take 1 tablet (300 mg total) by mouth daily. 90 tablet 3  . HYDROcodone-acetaminophen (NORCO) 5-325 MG tablet Take 1-2 tablets by mouth every 6 (six) hours as needed for moderate pain or severe pain. 20 tablet 0  . ibrutinib (IMBRUVICA) 140 MG capsul Take 3 capsules (420 mg total) by mouth daily. 90 capsule 0  . sulfamethoxazole-trimethoprim (BACTRIM DS,SEPTRA DS) 800-160 MG tablet Take 1 tablet by mouth 3 (three) times a week. 36 tablet 3  . hydrOXYzine (ATARAX/VISTARIL) 10 MG tablet Take 1 tablet (10 mg  total) by mouth 3 (three) times daily as needed for up to 14 days for itching. 30 tablet 2   No current facility-administered medications for this visit.     PHYSICAL EXAMINATION: ECOG PERFORMANCE STATUS: 0 - Asymptomatic  There were no vitals filed for this visit. There is no height or weight on file to calculate BMI.  There were no vitals filed for this visit.   T 97.6 HR 76 RR 18 BP 112/64 O2 saturation 100% on RA   GENERAL: alert, no distress and comfortable SKIN: skin color, texture, turgor are normal, no rashes or significant lesions EYES: conjunctiva are pink and non-injected, sclera  clear OROPHARYNX: no exudate, no erythema; lips, buccal mucosa, and tongue normal  NECK: supple, non-tender LYMPH: Market improvement in cervical and axillary adenopathy laterally, minimally palpable LUNGS: clear to auscultation and percussion with normal breathing effort HEART: regular rate & rhythm and no murmurs and no lower extremity edema ABDOMEN: soft, non-tender, non-distended, normal bowel sounds Musculoskeletal: no cyanosis of digits and no clubbing  PSYCH: alert & oriented x 3, fluent speech NEURO: no focal motor/sensory deficits  LABORATORY DATA:  I have reviewed the data as listed    Component Value Date/Time   NA 141 05/21/2018 1406   NA 142 03/31/2018 1649   K 4.2 05/21/2018 1406   CL 103 05/21/2018 1406   CO2 31 05/21/2018 1406   GLUCOSE 94 05/21/2018 1406   BUN 20 05/21/2018 1406   BUN 16 03/31/2018 1649   CREATININE 1.16 05/21/2018 1406   CALCIUM 9.5 05/21/2018 1406   PROT 6.1 (L) 05/21/2018 1406   PROT 5.9 (L) 03/31/2018 1649   ALBUMIN 4.5 05/21/2018 1406   ALBUMIN 4.6 03/31/2018 1649   AST 12 (L) 05/21/2018 1406   ALT 9 05/21/2018 1406   ALKPHOS 118 05/21/2018 1406   BILITOT 0.4 05/21/2018 1406   GFRNONAA >60 05/21/2018 1406   GFRAA >60 05/21/2018 1406    No results found for: SPEP, UPEP  Lab Results  Component Value Date   WBC 290.2 (HH) 05/21/2018   NEUTROABS 3.0 05/21/2018   HGB 9.4 (L) 05/21/2018   HCT 35.7 (L) 05/21/2018   MCV 91.1 05/21/2018   PLT 159 05/21/2018      Chemistry      Component Value Date/Time   NA 141 05/21/2018 1406   NA 142 03/31/2018 1649   K 4.2 05/21/2018 1406   CL 103 05/21/2018 1406   CO2 31 05/21/2018 1406   BUN 20 05/21/2018 1406   BUN 16 03/31/2018 1649   CREATININE 1.16 05/21/2018 1406      Component Value Date/Time   CALCIUM 9.5 05/21/2018 1406   ALKPHOS 118 05/21/2018 1406   AST 12 (L) 05/21/2018 1406   ALT 9 05/21/2018 1406   BILITOT 0.4 05/21/2018 1406       RADIOGRAPHIC STUDIES: I have  personally reviewed the radiological images as listed below and agreed with the findings in the report. US Venous Img Lower Unilateral Left  Result Date: 04/30/2018 CLINICAL DATA:  Acute left leg pain EXAM: LEFT LOWER EXTREMITY VENOUS DOPPLER ULTRASOUND TECHNIQUE: Gray-scale sonography with graded compression, as well as color Doppler and duplex ultrasound were performed to evaluate the lower extremity deep venous systems from the level of the common femoral vein and including the common femoral, femoral, profunda femoral, popliteal and calf veins including the posterior tibial, peroneal and gastrocnemius veins when visible. The superficial great saphenous vein was also interrogated. Spectral Doppler was utilized to evaluate  flow at rest and with distal augmentation maneuvers in the common femoral, femoral and popliteal veins. COMPARISON:  None. FINDINGS: Contralateral Common Femoral Vein: Respiratory phasicity is normal and symmetric with the symptomatic side. No evidence of thrombus. Normal compressibility. Common Femoral Vein: No evidence of thrombus. Normal compressibility, respiratory phasicity and response to augmentation. Saphenofemoral Junction: No evidence of thrombus. Normal compressibility and flow on color Doppler imaging. Profunda Femoral Vein: No evidence of thrombus. Normal compressibility and flow on color Doppler imaging. Femoral Vein: No evidence of thrombus. Normal compressibility, respiratory phasicity and response to augmentation. Popliteal Vein: No evidence of thrombus. Normal compressibility, respiratory phasicity and response to augmentation. Calf Veins: No evidence of thrombus. Normal compressibility and flow on color Doppler imaging. Superficial Great Saphenous Vein: No evidence of thrombus. Normal compressibility. Venous Reflux:  None. Other Findings:  5.9 x 0.9 x 4.0 cm popliteal cyst is noted. IMPRESSION: No evidence of deep venous thrombosis. Left popliteal cyst. Electronically  Signed   By: Inez Catalina M.D.   On: 04/30/2018 15:46   Xr Knee 3 View Left  Result Date: 05/20/2018 Three-view x-rays left knee obtained and reviewed.  This shows some flattening of the condyle minimal osteophyte formation some patellofemoral degenerative changes negative for acute fracture.  No lytic or sclerotic lesions. Impression: Mild osteoarthritis primarily patellofemoral, left knee.

## 2018-05-24 LAB — LACTATE DEHYDROGENASE: LDH: 269 U/L — ABNORMAL HIGH (ref 98–192)

## 2018-05-24 MED FILL — IMBRUVICA 140 MG CAPSULE: 140 | 15 days supply | Qty: 45 | Fill #1

## 2018-05-27 ENCOUNTER — Encounter: Payer: Self-pay | Admitting: Hematology

## 2018-05-27 ENCOUNTER — Encounter: Payer: Self-pay | Admitting: *Deleted

## 2018-05-27 ENCOUNTER — Telehealth: Payer: Self-pay | Admitting: *Deleted

## 2018-05-27 ENCOUNTER — Inpatient Hospital Stay (HOSPITAL_BASED_OUTPATIENT_CLINIC_OR_DEPARTMENT_OTHER): Payer: BLUE CROSS/BLUE SHIELD | Admitting: Hematology

## 2018-05-27 ENCOUNTER — Inpatient Hospital Stay: Payer: BLUE CROSS/BLUE SHIELD

## 2018-05-27 VITALS — BP 108/66 | HR 81 | Temp 98.1°F | Resp 18 | Ht 70.0 in | Wt 212.2 lb

## 2018-05-27 DIAGNOSIS — R161 Splenomegaly, not elsewhere classified: Secondary | ICD-10-CM

## 2018-05-27 DIAGNOSIS — C9110 Chronic lymphocytic leukemia of B-cell type not having achieved remission: Secondary | ICD-10-CM

## 2018-05-27 DIAGNOSIS — Z881 Allergy status to other antibiotic agents status: Secondary | ICD-10-CM | POA: Diagnosis not present

## 2018-05-27 DIAGNOSIS — Z792 Long term (current) use of antibiotics: Secondary | ICD-10-CM | POA: Diagnosis not present

## 2018-05-27 DIAGNOSIS — N179 Acute kidney failure, unspecified: Secondary | ICD-10-CM | POA: Diagnosis not present

## 2018-05-27 DIAGNOSIS — D6959 Other secondary thrombocytopenia: Secondary | ICD-10-CM | POA: Diagnosis not present

## 2018-05-27 DIAGNOSIS — Z880 Allergy status to penicillin: Secondary | ICD-10-CM

## 2018-05-27 DIAGNOSIS — M1712 Unilateral primary osteoarthritis, left knee: Secondary | ICD-10-CM | POA: Diagnosis not present

## 2018-05-27 DIAGNOSIS — D696 Thrombocytopenia, unspecified: Secondary | ICD-10-CM

## 2018-05-27 DIAGNOSIS — L270 Generalized skin eruption due to drugs and medicaments taken internally: Secondary | ICD-10-CM | POA: Diagnosis not present

## 2018-05-27 DIAGNOSIS — D649 Anemia, unspecified: Secondary | ICD-10-CM | POA: Diagnosis not present

## 2018-05-27 DIAGNOSIS — Z7952 Long term (current) use of systemic steroids: Secondary | ICD-10-CM | POA: Diagnosis not present

## 2018-05-27 DIAGNOSIS — R21 Rash and other nonspecific skin eruption: Secondary | ICD-10-CM | POA: Insufficient documentation

## 2018-05-27 DIAGNOSIS — Z79899 Other long term (current) drug therapy: Secondary | ICD-10-CM | POA: Diagnosis not present

## 2018-05-27 DIAGNOSIS — R600 Localized edema: Secondary | ICD-10-CM | POA: Diagnosis not present

## 2018-05-27 DIAGNOSIS — M1990 Unspecified osteoarthritis, unspecified site: Secondary | ICD-10-CM

## 2018-05-27 LAB — CBC WITH DIFFERENTIAL (CANCER CENTER ONLY)
Abs Immature Granulocytes: 0.16 10*3/uL — ABNORMAL HIGH (ref 0.00–0.07)
Basophils Absolute: 0.2 10*3/uL — ABNORMAL HIGH (ref 0.0–0.1)
Basophils Relative: 0 %
Eosinophils Absolute: 0.6 10*3/uL — ABNORMAL HIGH (ref 0.0–0.5)
Eosinophils Relative: 0 %
HCT: 36.6 % — ABNORMAL LOW (ref 39.0–52.0)
Hemoglobin: 9.7 g/dL — ABNORMAL LOW (ref 13.0–17.0)
Immature Granulocytes: 0 %
Lymphocytes Relative: 96 %
Lymphs Abs: 246 10*3/uL — ABNORMAL HIGH (ref 0.7–4.0)
MCH: 24 pg — ABNORMAL LOW (ref 26.0–34.0)
MCHC: 26.5 g/dL — ABNORMAL LOW (ref 30.0–36.0)
MCV: 90.6 fL (ref 80.0–100.0)
Monocytes Absolute: 6.3 10*3/uL — ABNORMAL HIGH (ref 0.1–1.0)
Monocytes Relative: 3 %
Neutro Abs: 2.2 10*3/uL (ref 1.7–7.7)
Neutrophils Relative %: 1 %
Platelet Count: 144 10*3/uL — ABNORMAL LOW (ref 150–400)
RBC: 4.04 MIL/uL — ABNORMAL LOW (ref 4.22–5.81)
RDW: 16.8 % — ABNORMAL HIGH (ref 11.5–15.5)
WBC Count: 255.4 10*3/uL (ref 4.0–10.5)
nRBC: 0 % (ref 0.0–0.2)

## 2018-05-27 LAB — CMP (CANCER CENTER ONLY)
ALT: 11 U/L (ref 0–44)
AST: 13 U/L — ABNORMAL LOW (ref 15–41)
Albumin: 3.9 g/dL (ref 3.5–5.0)
Alkaline Phosphatase: 97 U/L (ref 38–126)
Anion gap: 4 — ABNORMAL LOW (ref 5–15)
BUN: 17 mg/dL (ref 6–20)
CO2: 30 mmol/L (ref 22–32)
Calcium: 8.8 mg/dL — ABNORMAL LOW (ref 8.9–10.3)
Chloride: 104 mmol/L (ref 98–111)
Creatinine: 1.32 mg/dL — ABNORMAL HIGH (ref 0.61–1.24)
GFR, Est AFR Am: >60 mL/min (ref >60)
GFR, Estimated: >60 mL/min (ref >60)
Glucose, Bld: 92 mg/dL (ref 70–99)
Potassium: 4.4 mmol/L (ref 3.5–5.1)
Sodium: 138 mmol/L (ref 135–145)
Total Bilirubin: 0.5 mg/dL (ref 0.3–1.2)
Total Protein: 5.3 g/dL — ABNORMAL LOW (ref 6.5–8.1)

## 2018-05-27 LAB — LACTATE DEHYDROGENASE: LDH: 210 U/L — ABNORMAL HIGH (ref 98–192)

## 2018-05-27 LAB — URIC ACID: Uric Acid, Serum: 5.9 mg/dL (ref 3.7–8.6)

## 2018-05-27 NOTE — Telephone Encounter (Signed)
Richardson Landry from lab informed me of a critical WBC count of 255.4. Results given to MD.

## 2018-05-27 NOTE — Progress Notes (Signed)
[place] OFFICE PROGRESS NOTE  Patient Care Team: [name], MD as PCP - General (Family Medicine)  HEME/ONC OVERVIEW: 1. CLL with bulky lymphadenopathy; RAI Stage IV, intermediate risk -03/2018:   -PB flow showed CD5+, CD23+ monoclonal B-cells, c/w CLL; IgHV mutation negative  -FISH (PB) positive for PFX(90W40); negative t(11;14), p53 deletion/amplification, and ATM (11q22.3) deletion  -CT neck, CAP showed bulky lymphadenopathy in bilateral cervical LN's and throughout the chest, abdomen and pelvis; marked splenomegaly -04/2018: R axillary excision LN biopsy showed CLL/SLL (cyclin D1 neg) -05/2017 - present: ibrutinib  2. Normocytic anemia 3. Thrombocytopenia (resolved)  TREATMENT REGIMEN:  Ibrutinib; 05/14/2018 - present   ASSESSMENT & PLAN:   Generalized erythematous rash -New onset, generalized, covering at least 75% of the body surface area  -Given the timing of pruritus and rash with starting Bactrim, this is consistent with Bactrim side effect -No evidence of skin or mucosal desquamation to suggest Katherina Right syndrome   -As the patient has been on allopurinol for several weeks and ibrutinib for at least 2-3 weeks, medication side effect related to those medications is less likely -Bactrim has been discontinued  -He has been taking hydroxyzine during the day and Benadryl at night for pruritus and rash -If the rash resolves next week, then we can consider adding atovaquone for PJP ppx  -However, if it persists or worsens, then we will have to consider stopping allopurinol and refer the patient to dermatology for further evaluation -I counseled the patient on the importance of monitoring for any sign of skin desquamation, for which he should seek care immediately   CLL with diffuse lymphadenopathy; RAI Stage IV, intermediate risk  -He is tolerating treatment well with improvement in lymphadenopathy -WBC 255k today, improving from 290k last week -TLS  ppx: allopurinol -VZV and PJP ppx: acyclovir and Bactrim -PRN medications: loperamide, Zofran   Normocytic anemia -Hgb 9.7 today, stable; no evidence of hemolysis -We will monitor it for now  Thrombocytopenia -Plts 144k today, stable  -Patient denies any symptoms of bleeding or bruising -We will monitor it for now   Orders Placed This Encounter  Procedures  . CBC with Differential (Cancer Center Only)    Standing Status:   Future    Standing Expiration Date:   07/01/2019  . CMP ([facility] only)    Standing Status:   Future    Standing Expiration Date:   07/01/2019  . Lactate dehydrogenase    Standing Status:   Future    Standing Expiration Date:   07/01/2019    All questions were answered. The patient knows to call the clinic with any problems, questions or concerns. No barriers to learning was detected.  Return in 1 week for labs and clinic follow-up.  David Men, MD 05/27/2018 10:15 AM  CHIEF COMPLAINT: "The rash is pretty bad"  INTERVAL HISTORY: [name] returns to clinic for follow-up of CLL and evaluation of new onset generalized pruritic rash.  Patient reports that starting Saturday night last week, he noticed to progressive, generalized, erythematous, slightly raised rash all throughout his body, involving the upper and lower extremities and the trunk.  He looked up his symptoms online, and thought that the rash was possibly due to Bactrim, as he recently started this medication, so he stopped taking his Bactrim since last Friday.  He has also been taking hydroxyzine during the day and Benadryl at night for the pruritus.  He reports that since the rash first began, it has been overall  unchanged, possibly slightly improved, but he denies any skin desquamation or mucosal lesion in the mouth.  He denies any fever, chill, night sweats, weight change, worsening lymphadenopathy, chest pain, dyspnea, abdominal pain, nausea, vomiting, or diarrhea.  He has not recently had any  change in his medication or detergent.  SUMMARY OF ONCOLOGIC HISTORY:   CLL (chronic lymphocytic leukemia) ([place])   04/01/2018 Initial Diagnosis    CLL (chronic lymphocytic leukemia) ([place])    04/05/2018 Pathology Results    Peripheral Blood Flow Cytometry - MONOCLONAL B-CELL POPULATION IDENTIFIED. - SEE NOTE.    04/05/2018 Pathology Results    PB FISH: No evidence of CCND1-IGH [t(11;14)] gene rearrangement. No evidence of trisomy 11 or gain of 11q. (excludes mantle cell lymphoma) No evidence of trisomy 12.  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 No evidence of p53 (17p13( deletion or amplification. No evidence of ATM (11q22.3) deletion.     04/07/2018 Imaging    CT neck w/ contrast: IMPRESSION: Bulky bilateral cervical lymphadenopathy consistent with lymphoproliferative disease/lymphoma.    04/07/2018 Imaging    CT CAP w/ contrast: IMPRESSION: 1. Bulky lymphadenopathy throughout the chest, abdomen and pelvis, as detailed above, highly suspicious for lymphoma. 2. Marked splenomegaly. 3. 9 mm pulmonary nodule within the anterior inferior portion of the RIGHT upper lobe. Subtle low-density nodule within the LEFT lower lobe. These are too small to definitively characterize. Neoplastic pulmonary nodules cannot be excluded. Consider PET-CT for further characterization.    04/16/2018 Surgery    Right axillary LN excisional biopsy    04/16/2018 Pathology Results    (Accession: [id number]) Lymph node for lymphoma, Right Axillary - CHRONIC LYMPHOCYTIC LEUKEMIA/SMALL LYMPHOCYTIC LYMPHOMA.     REVIEW OF SYSTEMS:   Constitutional: ( - ) fevers, ( - )  chills , ( - ) night sweats Eyes: ( - ) blurriness of vision, ( - ) double vision, ( - ) watery eyes Ears, nose, mouth, throat, and face: ( - ) mucositis, ( - ) sore throat Respiratory: ( - ) cough, ( - ) dyspnea, ( - ) wheezes Cardiovascular: ( - ) palpitation, ( - ) chest discomfort, ( - ) lower extremity  swelling Gastrointestinal:  ( - ) nausea, ( - ) heartburn, ( - ) change in bowel habits Skin: ( - ) abnormal skin rashes Lymphatics: ( - ) new lymphadenopathy, ( - ) easy bruising Neurological: ( - ) numbness, ( - ) tingling, ( - ) new weaknesses Behavioral/Psych: ( - ) mood change, ( - ) new changes  All other systems were reviewed with the patient and are negative.  I have reviewed the past medical history, past surgical history, social history and family history with the patient and they are unchanged from previous note.  ALLERGIES:  is allergic to amoxicillin.  MEDICATIONS:  Current Outpatient Medications  Medication Sig Dispense Refill  . HYDROcodone-acetaminophen (NORCO) 5-325 MG tablet Take 1-2 tablets by mouth every 6 (six) hours as needed for moderate pain or severe pain. 20 tablet 0  . hydrOXYzine (ATARAX/VISTARIL) 10 MG tablet Take 1 tablet (10 mg total) by mouth 3 (three) times daily as needed for up to 14 days for itching. 30 tablet 2  . acyclovir (ZOVIRAX) 400 MG tablet Take 1 tablet (400 mg total) by mouth 2 (two) times daily. (Patient not taking: Reported on 05/27/2018) 180 tablet 3  . allopurinol (ZYLOPRIM) 300 MG tablet Take 1 tablet (300 mg total) by mouth daily. (Patient not taking: Reported on 05/27/2018) 90 tablet  3  . ibrutinib (IMBRUVICA) 140 MG capsul Take 3 capsules (420 mg total) by mouth daily. (Patient not taking: Reported on 05/27/2018) 90 capsule 0   No current facility-administered medications for this visit.     PHYSICAL EXAMINATION: ECOG PERFORMANCE STATUS: 1 - Symptomatic but completely ambulatory  Today's Vitals   05/27/18 0942  BP: 108/66  Pulse: 81  Resp: 18  Temp: 98.1 F (36.7 C)  TempSrc: Oral  SpO2: 100%  Weight: 212 lb 3.2 oz (96.3 kg)  Height: '5\' 10"'  (1.778 m)  PainSc: 0-No pain   Body mass index is 30.45 kg/m.  Filed Weights   05/27/18 0942  Weight: 212 lb 3.2 oz (96.3 kg)    GENERAL: alert, no distress and comfortable SKIN:  skin color, texture, turgor are normal, no rashes or significant lesions EYES: conjunctiva are pink and non-injected, sclera clear OROPHARYNX: no exudate, no erythema; lips, buccal mucosa, and tongue normal  NECK: supple, non-tender LYMPH:  no palpable lymphadenopathy in the cervical or axillary  LUNGS: clear to auscultation and percussion with normal breathing effort HEART: regular rate & rhythm and no murmurs and no lower extremity edema ABDOMEN: soft, non-tender, non-distended, normal bowel sounds Musculoskeletal: no cyanosis of digits and no clubbing  PSYCH: alert & oriented x 3, fluent speech NEURO: no focal motor/sensory deficits  LABORATORY DATA:  I have reviewed the data as listed    Component Value Date/Time   NA 138 05/27/2018 0927   NA 142 03/31/2018 1649   K 4.4 05/27/2018 0927   CL 104 05/27/2018 0927   CO2 30 05/27/2018 0927   GLUCOSE 92 05/27/2018 0927   BUN 17 05/27/2018 0927   BUN 16 03/31/2018 1649   CREATININE 1.32 (H) 05/27/2018 0927   CALCIUM 8.8 (L) 05/27/2018 0927   PROT 5.3 (L) 05/27/2018 0927   PROT 5.9 (L) 03/31/2018 1649   ALBUMIN 3.9 05/27/2018 0927   ALBUMIN 4.6 03/31/2018 1649   AST 13 (L) 05/27/2018 0927   ALT 11 05/27/2018 0927   ALKPHOS 97 05/27/2018 0927   BILITOT 0.5 05/27/2018 0927   GFRNONAA >60 05/27/2018 0927   GFRAA >60 05/27/2018 0927    No results found for: SPEP, UPEP  Lab Results  Component Value Date   WBC 255.4 (HH) 05/27/2018   NEUTROABS 2.2 05/27/2018   HGB 9.7 (L) 05/27/2018   HCT 36.6 (L) 05/27/2018   MCV 90.6 05/27/2018   PLT 144 (L) 05/27/2018      Chemistry      Component Value Date/Time   NA 138 05/27/2018 0927   NA 142 03/31/2018 1649   K 4.4 05/27/2018 0927   CL 104 05/27/2018 0927   CO2 30 05/27/2018 0927   BUN 17 05/27/2018 0927   BUN 16 03/31/2018 1649   CREATININE 1.32 (H) 05/27/2018 0927      Component Value Date/Time   CALCIUM 8.8 (L) 05/27/2018 0927   ALKPHOS 97 05/27/2018 0927   AST 13 (L)  05/27/2018 0927   ALT 11 05/27/2018 0927   BILITOT 0.5 05/27/2018 0927       RADIOGRAPHIC STUDIES: I have personally reviewed the radiological images as listed below and agreed with the findings in the report. US Venous Img Lower Unilateral Left  Result Date: 04/30/2018 CLINICAL DATA:  Acute left leg pain EXAM: LEFT LOWER EXTREMITY VENOUS DOPPLER ULTRASOUND TECHNIQUE: Gray-scale sonography with graded compression, as well as color Doppler and duplex ultrasound were performed to evaluate the lower extremity deep venous systems from the level  of the common femoral vein and including the common femoral, femoral, profunda femoral, popliteal and calf veins including the posterior tibial, peroneal and gastrocnemius veins when visible. The superficial great saphenous vein was also interrogated. Spectral Doppler was utilized to evaluate flow at rest and with distal augmentation maneuvers in the common femoral, femoral and popliteal veins. COMPARISON:  None. FINDINGS: Contralateral Common Femoral Vein: Respiratory phasicity is normal and symmetric with the symptomatic side. No evidence of thrombus. Normal compressibility. Common Femoral Vein: No evidence of thrombus. Normal compressibility, respiratory phasicity and response to augmentation. Saphenofemoral Junction: No evidence of thrombus. Normal compressibility and flow on color Doppler imaging. Profunda Femoral Vein: No evidence of thrombus. Normal compressibility and flow on color Doppler imaging. Femoral Vein: No evidence of thrombus. Normal compressibility, respiratory phasicity and response to augmentation. Popliteal Vein: No evidence of thrombus. Normal compressibility, respiratory phasicity and response to augmentation. Calf Veins: No evidence of thrombus. Normal compressibility and flow on color Doppler imaging. Superficial Great Saphenous Vein: No evidence of thrombus. Normal compressibility. Venous Reflux:  None. Other Findings:  5.9 x 0.9 x 4.0 cm  popliteal cyst is noted. IMPRESSION: No evidence of deep venous thrombosis. Left popliteal cyst. Electronically Signed   By: Inez Catalina M.D.   On: 04/30/2018 15:46   Xr Knee 3 View Left  Result Date: 05/20/2018 Three-view x-rays left knee obtained and reviewed.  This shows some flattening of the condyle minimal osteophyte formation some patellofemoral degenerative changes negative for acute fracture.  No lytic or sclerotic lesions. Impression: Mild osteoarthritis primarily patellofemoral, left knee.

## 2018-05-28 ENCOUNTER — Inpatient Hospital Stay (HOSPITAL_BASED_OUTPATIENT_CLINIC_OR_DEPARTMENT_OTHER): Payer: BLUE CROSS/BLUE SHIELD | Admitting: Hematology

## 2018-05-28 ENCOUNTER — Encounter: Payer: Self-pay | Admitting: Hematology

## 2018-05-28 ENCOUNTER — Other Ambulatory Visit: Payer: Self-pay | Admitting: Hematology

## 2018-05-28 DIAGNOSIS — L299 Pruritus, unspecified: Secondary | ICD-10-CM

## 2018-05-28 DIAGNOSIS — D649 Anemia, unspecified: Secondary | ICD-10-CM

## 2018-05-28 DIAGNOSIS — C9110 Chronic lymphocytic leukemia of B-cell type not having achieved remission: Secondary | ICD-10-CM

## 2018-05-28 DIAGNOSIS — R600 Localized edema: Secondary | ICD-10-CM | POA: Diagnosis not present

## 2018-05-28 DIAGNOSIS — L03119 Cellulitis of unspecified part of limb: Secondary | ICD-10-CM

## 2018-05-28 DIAGNOSIS — Z880 Allergy status to penicillin: Secondary | ICD-10-CM | POA: Diagnosis not present

## 2018-05-28 DIAGNOSIS — L270 Generalized skin eruption due to drugs and medicaments taken internally: Secondary | ICD-10-CM

## 2018-05-28 DIAGNOSIS — Z792 Long term (current) use of antibiotics: Secondary | ICD-10-CM | POA: Diagnosis not present

## 2018-05-28 DIAGNOSIS — Z79899 Other long term (current) drug therapy: Secondary | ICD-10-CM

## 2018-05-28 DIAGNOSIS — Z881 Allergy status to other antibiotic agents status: Secondary | ICD-10-CM | POA: Diagnosis not present

## 2018-05-28 DIAGNOSIS — R161 Splenomegaly, not elsewhere classified: Secondary | ICD-10-CM | POA: Diagnosis not present

## 2018-05-28 DIAGNOSIS — D6959 Other secondary thrombocytopenia: Secondary | ICD-10-CM | POA: Diagnosis not present

## 2018-05-28 DIAGNOSIS — M1712 Unilateral primary osteoarthritis, left knee: Secondary | ICD-10-CM | POA: Diagnosis not present

## 2018-05-28 DIAGNOSIS — M1990 Unspecified osteoarthritis, unspecified site: Secondary | ICD-10-CM

## 2018-05-28 DIAGNOSIS — N179 Acute kidney failure, unspecified: Secondary | ICD-10-CM | POA: Diagnosis not present

## 2018-05-28 DIAGNOSIS — Z7952 Long term (current) use of systemic steroids: Secondary | ICD-10-CM | POA: Diagnosis not present

## 2018-05-28 MED ORDER — CLINDAMYCIN HCL 150 MG PO CAPS: 450.0000 mg | ORAL_CAPSULE | Freq: Three times a day (TID) | ORAL | 0 refills | Status: AC

## 2018-05-28 MED ORDER — DEXAMETHASONE 4 MG PO TABS: 8.0000 mg | ORAL_TABLET | Freq: Every day | ORAL | 0 refills | Status: DC

## 2018-05-28 MED FILL — CLINDAMYCIN HCL 150 MG CAPS: 150 | 10 days supply | Qty: 90 | Fill #0

## 2018-05-28 NOTE — Progress Notes (Signed)
[facility] OFFICE PROGRESS NOTE  Patient Care Team: [name], MD as PCP - General (Family Medicine)  HEME/ONC OVERVIEW: 1. CLL with bulky lymphadenopathy; RAI Stage IV, intermediate risk -03/2018:   -PB flow showed CD5+, CD23+ monoclonal B-cells, c/w CLL; IgHV mutation negative  -FISH (PB) positive for IHK(74Q59); negative t(11;14), p53 deletion/amplification, and ATM (11q22.3) deletion  -CT neck, CAP showed bulky lymphadenopathy in bilateral cervical LN's and throughout the chest, abdomen and pelvis; marked splenomegaly -04/2018: R axillary excision LN biopsy showed CLL/SLL (cyclin D1 neg) -05/2017 - present: ibrutinib  2. Normocytic anemia 3. Thrombocytopenia  TREATMENT REGIMEN:  Ibrutinib; 05/14/2018 - present   ASSESSMENT & PLAN:   Generalized erythematous rash, swelling and warmth -While the erythema has improved, he has developed new onset, increasing swelling and warmth over the extremities (arms > legs); he denies any fever or purulent drainage from the skin -Given the recent allergic drug reaction causing significant pruritis, this is suggestive of superimposed cellulitis  -Due to the patient's hx of penicillin and likely Bactrim allergies, I have prescribed clindamycin 41m TID x 10 days  -Bactrim discontinued since the end of last week; as the offending medication is unclear, I have also instructed the patient to stop allopurinol  -Patient is instructed to contact the clinic or go to the ER if he develops worsening swelling, skin or mucosal desquamation, or fever > 100.4  -If no improvement in the swelling and erythema after the abx as well as discontinuation of Bactrim and allopurinol, we will refer the patient to dermatology for further evaluation   CLL with diffuse lymphadenopathy; RAI Stage IV, intermediate risk  -He is tolerating treatment well with improvement in lymphadenopathy -WBC 255k on 05/27/2018, improving from 290k last week -Continue  ibrutinib for now  -I encouraged the patient to maintain adequate hydration  -VZV ppx: acyclovir -PRN medications: loperamide, Zofran   All questions were answered. The patient knows to call the clinic with any problems, questions or concerns. No barriers to learning was detected.  Return in 1 week for labs and clinic follow-up.   YTish Men MD 05/28/2018 3:58 PM  CHIEF COMPLAINT: "My arms and legs are swollen"  INTERVAL HISTORY: Mr. MCrisantopresented to clinic as an add-on today for urgent evaluation of new onset extremity swelling and increased warmth.  Patient was seen in clinic yesterday for generalized erythematous rash, thought to be drug related.  Bactrim was discontinued, and patient was instructed to continue supportive care, including Benadryl.  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  His lower extremities were also more swollen.  There is slight discomfort in the arms and legs due to the swelling, but he denies any significant pain, fever, chill, or purulent drainage from the skin.  He denies any skin or mucosal desquamation.   SUMMARY OF ONCOLOGIC HISTORY:   [place]   04/01/2018 Initial Diagnosis    [place] lymph[place]) [place]    04/05/2018 Pathology Results    Peripheral Blood Flow Cytometry - MONOCLONAL B-CELL POPULATION IDENTIFIED. - SEE NOTE.    04/05/2018 Pathology Results    PB FISH: No evidence of CCND1-IGH [t(11;14)] gene rearrangement. No evidence of trisomy 11 or gain of 11q. (excludes mantle cell lymphoma) No evidence of trisomy 12.  Mono-allelic deletion of [id number](13q14.3) locus is detected. No  evidence of p53 (17p13( deletion or amplification. No evidence of ATM (11q22.3) deletion.     04/07/2018 Imaging    CT neck w/  contrast: IMPRESSION: Bulky bilateral cervical lymphadenopathy consistent with lymphoproliferative disease/lymphoma.    04/07/2018 Imaging    CT CAP w/ contrast: IMPRESSION: 1. Bulky lymphadenopathy throughout the chest, abdomen and pelvis, as detailed above, highly suspicious for lymphoma. 2. Marked splenomegaly. 3. 9 mm pulmonary nodule within the anterior inferior portion of the RIGHT upper lobe. Subtle low-density nodule within the LEFT lower lobe. These are too small to definitively characterize. Neoplastic pulmonary nodules cannot be excluded. Consider PET-CT for further characterization.    04/16/2018 Surgery    Right axillary LN excisional biopsy    04/16/2018 Pathology Results    (Accession: [id number]) Lymph node for lymphoma, Right Axillary - CHRONIC LYMPHOCYTIC LEUKEMIA/SMALL LYMPHOCYTIC LYMPHOMA.     REVIEW OF SYSTEMS:   Constitutional: ( - ) fevers, ( - )  chills , ( - ) night sweats Eyes: ( - ) blurriness of vision, ( - ) double vision, ( - ) watery eyes Ears, nose, mouth, throat, and face: ( - ) mucositis, ( - ) sore throat Respiratory: ( - ) cough, ( - ) dyspnea, ( - ) wheezes Cardiovascular: ( - ) palpitation, ( - ) chest discomfort, ( - ) lower extremity swelling Gastrointestinal:  ( - ) nausea, ( - ) heartburn, ( - ) change in bowel habits Lymphatics: ( - ) new lymphadenopathy, ( - ) easy bruising Neurological: ( - ) numbness, ( - ) tingling, ( - ) new weaknesses Behavioral/Psych: ( - ) mood change, ( - ) new changes  All other systems were reviewed with the patient and are negative.  I have reviewed the past medical history, past surgical history, social history and family history with the patient and they are unchanged from previous note.  ALLERGIES:  is allergic to bactrim [sulfamethoxazole-trimethoprim] and amoxicillin.  MEDICATIONS:  Current Outpatient Medications  Medication Sig Dispense Refill  . acyclovir (ZOVIRAX) 400 MG tablet Take 1 tablet (400  mg total) by mouth 2 (two) times daily. (Patient not taking: Reported on 05/27/2018) 180 tablet 3  . allopurinol (ZYLOPRIM) 300 MG tablet Take 1 tablet (300 mg total) by mouth daily. (Patient not taking: Reported on 05/27/2018) 90 tablet 3  . clindamycin (CLEOCIN) 150 MG capsule Take 3 capsules (450 mg total) by mouth 3 (three) times daily for 10 days. 90 capsule 0  . HYDROcodone-acetaminophen (NORCO) 5-325 MG tablet Take 1-2 tablets by mouth every 6 (six) hours as needed for moderate pain or severe pain. 20 tablet 0  . hydrOXYzine (ATARAX/VISTARIL) 10 MG tablet Take 1 tablet (10 mg total) by mouth 3 (three) times daily as needed for up to 14 days for itching. 30 tablet 2  . ibrutinib (IMBRUVICA) 140 MG capsul Take 3 capsules (420 mg total) by mouth daily. (Patient not taking: Reported on 05/27/2018) 90 capsule 0   No current facility-administered medications for this visit.     PHYSICAL EXAMINATION: ECOG PERFORMANCE STATUS: 1 - Symptomatic but completely ambulatory  GENERAL: alert, no distress and comfortable SKIN: mildly papular erythematous rash covering at least 75% of the body; erythema improving since yesterday; 1-2+ edema in bilateral forearms/hands, 1+ edema in bilateral lower extremities below the knees, non-tender, no purulent drainage  EYES: conjunctiva are pink and non-injected, sclera clear OROPHARYNX: no exudate, no erythema; lips, buccal mucosa, and tongue normal  NECK: supple, non-tender LYMPH:  Improving cervical and axillary  adenopathy  LUNGS: clear to auscultation and percussion with normal breathing effort HEART: regular rate & rhythm and no murmurs ABDOMEN: soft, non-tender, non-distended, normal bowel sounds Musculoskeletal: no cyanosis of digits and no clubbing  PSYCH: alert & oriented x 3, fluent speech NEURO: no focal motor/sensory deficits   LABORATORY DATA:  I have reviewed the data as listed    Component Value Date/Time   NA 138 05/27/2018 0927   NA 142  03/31/2018 1649   K 4.4 05/27/2018 0927   CL 104 05/27/2018 0927   CO2 30 05/27/2018 0927   GLUCOSE 92 05/27/2018 0927   BUN 17 05/27/2018 0927   BUN 16 03/31/2018 1649   CREATININE 1.32 (H) 05/27/2018 0927   CALCIUM 8.8 (L) 05/27/2018 0927   PROT 5.3 (L) 05/27/2018 0927   PROT 5.9 (L) 03/31/2018 1649   ALBUMIN 3.9 05/27/2018 0927   ALBUMIN 4.6 03/31/2018 1649   AST 13 (L) 05/27/2018 0927   ALT 11 05/27/2018 0927   ALKPHOS 97 05/27/2018 0927   BILITOT 0.5 05/27/2018 0927   GFRNONAA >60 05/27/2018 0927   GFRAA >60 05/27/2018 0927    No results found for: SPEP, UPEP  Lab Results  Component Value Date   WBC 255.4 (HH) 05/27/2018   NEUTROABS 2.2 05/27/2018   HGB 9.7 (L) 05/27/2018   HCT 36.6 (L) 05/27/2018   MCV 90.6 05/27/2018   PLT 144 (L) 05/27/2018      Chemistry      Component Value Date/Time   NA 138 05/27/2018 0927   NA 142 03/31/2018 1649   K 4.4 05/27/2018 0927   CL 104 05/27/2018 0927   CO2 30 05/27/2018 0927   BUN 17 05/27/2018 0927   BUN 16 03/31/2018 1649   CREATININE 1.32 (H) 05/27/2018 0927      Component Value Date/Time   CALCIUM 8.8 (L) 05/27/2018 0927   ALKPHOS 97 05/27/2018 0927   AST 13 (L) 05/27/2018 0927   ALT 11 05/27/2018 0927   BILITOT 0.5 05/27/2018 0927       RADIOGRAPHIC STUDIES: I have personally reviewed the radiological images as listed below and agreed with the findings in the report. US Venous Img Lower Unilateral Left  Result Date: 04/30/2018 CLINICAL DATA:  Acute left leg pain EXAM: LEFT LOWER EXTREMITY VENOUS DOPPLER ULTRASOUND TECHNIQUE: Gray-scale sonography with graded compression, as well as color Doppler and duplex ultrasound were performed to evaluate the lower extremity deep venous systems from the level of the common femoral vein and including the common femoral, femoral, profunda femoral, popliteal and calf veins including the posterior tibial, peroneal and gastrocnemius veins when visible. The superficial great  saphenous vein was also interrogated. Spectral Doppler was utilized to evaluate flow at rest and with distal augmentation maneuvers in the common femoral, femoral and popliteal veins. COMPARISON:  None. FINDINGS: Contralateral Common Femoral Vein: Respiratory phasicity is normal and symmetric with the symptomatic side. No evidence of thrombus. Normal compressibility. Common Femoral Vein: No evidence of thrombus. Normal compressibility, respiratory phasicity and response to augmentation. Saphenofemoral Junction: No evidence of thrombus. Normal compressibility and flow on color Doppler imaging. Profunda Femoral Vein: No evidence of thrombus. Normal compressibility and flow on color Doppler imaging. Femoral Vein: No evidence of thrombus. Normal compressibility, respiratory phasicity and response to augmentation. Popliteal Vein: No evidence of thrombus. Normal compressibility, respiratory phasicity and response to augmentation. Calf Veins: No evidence of thrombus. Normal compressibility and flow on color Doppler imaging. Superficial Great Saphenous Vein: No evidence of thrombus. Normal  compressibility. Venous Reflux:  None. Other Findings:  5.9 x 0.9 x 4.0 cm popliteal cyst is noted. IMPRESSION: No evidence of deep venous thrombosis. Left popliteal cyst. Electronically Signed   By: Inez Catalina M.D.   On: 04/30/2018 15:46   Xr Knee 3 View Left  Result Date: 05/20/2018 Three-view x-rays left knee obtained and reviewed.  This shows some flattening of the condyle minimal osteophyte formation some patellofemoral degenerative changes negative for acute fracture.  No lytic or sclerotic lesions. Impression: Mild osteoarthritis primarily patellofemoral, left knee.

## 2018-05-31 ENCOUNTER — Encounter: Payer: Self-pay | Admitting: Hematology

## 2018-05-31 ENCOUNTER — Inpatient Hospital Stay: Payer: BLUE CROSS/BLUE SHIELD

## 2018-05-31 ENCOUNTER — Other Ambulatory Visit: Payer: Self-pay | Admitting: Hematology

## 2018-05-31 ENCOUNTER — Inpatient Hospital Stay (HOSPITAL_BASED_OUTPATIENT_CLINIC_OR_DEPARTMENT_OTHER): Payer: BLUE CROSS/BLUE SHIELD | Admitting: Hematology

## 2018-05-31 ENCOUNTER — Other Ambulatory Visit: Payer: Self-pay

## 2018-05-31 VITALS — Ht 70.0 in | Wt 218.5 lb

## 2018-05-31 DIAGNOSIS — L270 Generalized skin eruption due to drugs and medicaments taken internally: Secondary | ICD-10-CM | POA: Diagnosis not present

## 2018-05-31 DIAGNOSIS — M1712 Unilateral primary osteoarthritis, left knee: Secondary | ICD-10-CM | POA: Diagnosis not present

## 2018-05-31 DIAGNOSIS — R600 Localized edema: Secondary | ICD-10-CM

## 2018-05-31 DIAGNOSIS — Z7952 Long term (current) use of systemic steroids: Secondary | ICD-10-CM | POA: Diagnosis not present

## 2018-05-31 DIAGNOSIS — Z881 Allergy status to other antibiotic agents status: Secondary | ICD-10-CM

## 2018-05-31 DIAGNOSIS — Z792 Long term (current) use of antibiotics: Secondary | ICD-10-CM | POA: Diagnosis not present

## 2018-05-31 DIAGNOSIS — Z880 Allergy status to penicillin: Secondary | ICD-10-CM | POA: Diagnosis not present

## 2018-05-31 DIAGNOSIS — Z79899 Other long term (current) drug therapy: Secondary | ICD-10-CM

## 2018-05-31 DIAGNOSIS — D6959 Other secondary thrombocytopenia: Secondary | ICD-10-CM | POA: Diagnosis not present

## 2018-05-31 DIAGNOSIS — M1990 Unspecified osteoarthritis, unspecified site: Secondary | ICD-10-CM

## 2018-05-31 DIAGNOSIS — R161 Splenomegaly, not elsewhere classified: Secondary | ICD-10-CM

## 2018-05-31 DIAGNOSIS — C9110 Chronic lymphocytic leukemia of B-cell type not having achieved remission: Secondary | ICD-10-CM

## 2018-05-31 DIAGNOSIS — D696 Thrombocytopenia, unspecified: Secondary | ICD-10-CM

## 2018-05-31 DIAGNOSIS — D649 Anemia, unspecified: Secondary | ICD-10-CM

## 2018-05-31 DIAGNOSIS — N179 Acute kidney failure, unspecified: Secondary | ICD-10-CM | POA: Diagnosis not present

## 2018-05-31 DIAGNOSIS — M7989 Other specified soft tissue disorders: Secondary | ICD-10-CM | POA: Insufficient documentation

## 2018-05-31 LAB — CMP (CANCER CENTER ONLY)
ALT: 11 U/L (ref 0–44)
AST: 12 U/L — ABNORMAL LOW (ref 15–41)
Albumin: 3.9 g/dL (ref 3.5–5.0)
Alkaline Phosphatase: 81 U/L (ref 38–126)
Anion gap: 5 (ref 5–15)
BUN: 17 mg/dL (ref 6–20)
CO2: 30 mmol/L (ref 22–32)
Calcium: 8.8 mg/dL — ABNORMAL LOW (ref 8.9–10.3)
Chloride: 106 mmol/L (ref 98–111)
Creatinine: 1.16 mg/dL (ref 0.61–1.24)
GFR, Est AFR Am: >60 mL/min (ref >60)
GFR, Estimated: >60 mL/min (ref >60)
Glucose, Bld: 78 mg/dL (ref 70–99)
Potassium: 4.4 mmol/L (ref 3.5–5.1)
Sodium: 141 mmol/L (ref 135–145)
Total Bilirubin: 0.1 mg/dL — ABNORMAL LOW (ref 0.3–1.2)
Total Protein: 5.1 g/dL — ABNORMAL LOW (ref 6.5–8.1)

## 2018-05-31 LAB — CBC WITH DIFFERENTIAL (CANCER CENTER ONLY)
Abs Immature Granulocytes: 0.32 10*3/uL — ABNORMAL HIGH (ref 0.00–0.07)
Basophils Absolute: 0.1 10*3/uL (ref 0.0–0.1)
Basophils Relative: 0 %
Eosinophils Absolute: 1.7 10*3/uL — ABNORMAL HIGH (ref 0.0–0.5)
Eosinophils Relative: 1 %
HCT: 36.1 % — ABNORMAL LOW (ref 39.0–52.0)
Hemoglobin: 9.2 g/dL — ABNORMAL LOW (ref 13.0–17.0)
Immature Granulocytes: 0 %
Lymphocytes Relative: 96 %
Lymphs Abs: 265 10*3/uL — ABNORMAL HIGH (ref 0.7–4.0)
MCH: 23.5 pg — ABNORMAL LOW (ref 26.0–34.0)
MCHC: 25.5 g/dL — ABNORMAL LOW (ref 30.0–36.0)
MCV: 92.1 fL (ref 80.0–100.0)
Monocytes Absolute: 4.6 10*3/uL — ABNORMAL HIGH (ref 0.1–1.0)
Monocytes Relative: 2 %
Neutro Abs: 2.6 10*3/uL (ref 1.7–7.7)
Neutrophils Relative %: 1 %
Platelet Count: 130 10*3/uL — ABNORMAL LOW (ref 150–400)
RBC: 3.92 MIL/uL — ABNORMAL LOW (ref 4.22–5.81)
RDW: 18.4 % — ABNORMAL HIGH (ref 11.5–15.5)
WBC Count: 274.3 10*3/uL (ref 4.0–10.5)
nRBC: 0 % (ref 0.0–0.2)

## 2018-05-31 LAB — URIC ACID: Uric Acid, Serum: 6.1 mg/dL (ref 3.7–8.6)

## 2018-05-31 LAB — LACTATE DEHYDROGENASE: LDH: 220 U/L — ABNORMAL HIGH (ref 98–192)

## 2018-05-31 NOTE — Progress Notes (Signed)
[place] OFFICE PROGRESS NOTE  Patient Care Team: [name], MD as PCP - General (Family Medicine)  HEME/ONC OVERVIEW: 1. CLL with bulky lymphadenopathy; RAI Stage IV, intermediate risk -03/2018:   -PB flow showed CD5+, CD23+ monoclonal B-cells, c/w CLL; IgHV mutation negative  -FISH (PB) positive for XTK(24O97); negative t(11;14), p53 deletion/amplification, and ATM (11q22.3) deletion  -CT neck, CAP showed bulky lymphadenopathy in bilateral cervical LN's and throughout the chest, abdomen and pelvis; marked splenomegaly -04/2018: R axillary excision LN biopsy showed CLL/SLL (cyclin D1 neg) -05/2017 - present: ibrutinib  2. Normocytic anemia 3. Thrombocytopenia  TREATMENT REGIMEN:  Ibrutinib; 05/14/2018 - present   ASSESSMENT & PLAN:   Generalized erythematous rash -The rash is overall improving; erythema has resolved over the trunk, and there are some scabs over the upper extremities that are healing; no purulent drainage or evidence of David Whitehead  -Allopurinol and Bactrim both discontinued -Patient was started on clindamycin on 05/28/2018 for possible superimposed cellulitis, and he denies any side effects from the abx, such as diarrhea -I instruct the patient to complete the 10-day course of clindamycin   Generalized swelling  -Patient reports persistent swelling in the bilateral upper and lower extremities despite stopping allopurinol and Bactrim -Review of the medication side effects of ibrutinib suggests that this is due to medication-related fluid retention  -I have prescribed the patient dexamethasone 56m daily to see if this helps with fluid retention -If the swelling persists, we can also consider PRN lasix -Finally, if no improvement in the fluid retention, then we may have to consider dose reduction vs. interruption  CLL with diffuse lymphadenopathy; RAI Stage IV, intermediate risk  -WBC 274k today, slightly rising since last week;  given the drug reaction and possible superimposed cellulitis, rising WBC is expected -Continue ibrutinib for now -As discussed above, if fluid retention does not improve with steroid and diuretics, then we may have to consider dose reduction vs. interruption -VZV ppx: acyclovir -PRN medications: loperamide, Zofran   Mild AKI -Cr 1.16 today, improving from 1.32 last week -I encourage the patient to maintain adequate oral hydration, and to avoid nephrotoxic medications such as NSAID  Normocytic anemia -Secondary to CLL  -Hgb 9.2 today, stable -We will monitor it for now  Thrombocytopenia -Secondary to CLL -Plts 130k today, stable -We will monitor it for now   Orders Placed This Encounter  Procedures  . CBC with Differential (Cancer Center Only)    Standing Status:   Future    Standing Expiration Date:   07/05/2019  . CMP ([facility])    Standing Status:   Future    Standing Expiration Date:   07/05/2019  . Lactate dehydrogenase    Standing Status:   Future    Standing Expiration Date:   07/05/2019  . Uric acid    Standing Status:   Future    Standing Expiration Date:   07/05/2019   All questions were answered. The patient knows to call the clinic with any problems, questions or concerns. No barriers to learning was detected.  Return on 06/04/2018 for labs, and clinic follow-up.   YTish Men MD 05/31/2018 1:33 PM  CHIEF COMPLAINT: "I am doing okay"  INTERVAL HISTORY: David Whitehead to clinic for follow-up of CLL, generalized erythematous rash and swelling.  Patient reports that since stopping allopurinol and Bactrim, the erythema has improved significantly.  Rash is nearly resolved on his trunk, and the remaining rash over the upper extremities (primarily bilateral  forearms) is forming some scabs without any purulent drainage or pain.  However, he continued to have persistent generalized swelling in the extremities, especially in the bilateral upper extremities.  He  is able to maintain adequate oral hydration.  He is tolerating clindamycin without significant side effects, such as diarrhea.  He denies any fever, chill, night sweats, worsening lymphadenopathy, chest pain, dyspnea, abdominal pain, nausea, vomiting, or abnormal bleeding/bruising.  SUMMARY OF ONCOLOGIC HISTORY:   CLL (chronic lymphocytic leukemia) ([place])   04/01/2018 Initial Diagnosis    CLL (chronic lymphocytic leukemia) ([place])    04/05/2018 Pathology Results    Peripheral Blood Flow Cytometry - MONOCLONAL B-CELL POPULATION IDENTIFIED. - SEE NOTE.    04/05/2018 Pathology Results    PB FISH: No evidence of CCND1-IGH [t(11;14)] gene rearrangement. No evidence of trisomy 11 or gain of 11q. (excludes mantle cell lymphoma) No evidence of trisomy 12.  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 No evidence of p53 (17p13( deletion or amplification. No evidence of ATM (11q22.3) deletion.     04/07/2018 Imaging    CT neck w/ contrast: IMPRESSION: Bulky bilateral cervical lymphadenopathy consistent with lymphoproliferative disease/lymphoma.    04/07/2018 Imaging    CT CAP w/ contrast: IMPRESSION: 1. Bulky lymphadenopathy throughout the chest, abdomen and pelvis, as detailed above, highly suspicious for lymphoma. 2. Marked splenomegaly. 3. 9 mm pulmonary nodule within the anterior inferior portion of the RIGHT upper lobe. Subtle low-density nodule within the LEFT lower lobe. These are too small to definitively characterize. Neoplastic pulmonary nodules cannot be excluded. Consider PET-CT for further characterization.    04/16/2018 Surgery    Right axillary LN excisional biopsy    04/16/2018 Pathology Results    (Accession: [id number]) Lymph node for lymphoma, Right Axillary - CHRONIC LYMPHOCYTIC LEUKEMIA/SMALL LYMPHOCYTIC LYMPHOMA.     REVIEW OF SYSTEMS:   Constitutional: ( - ) fevers, ( - )  chills , ( - ) night sweats Eyes: ( - ) blurriness of vision, ( - )  double vision, ( - ) watery eyes Ears, nose, mouth, throat, and face: ( - ) mucositis, ( - ) sore throat Respiratory: ( - ) cough, ( - ) dyspnea, ( - ) wheezes Cardiovascular: ( - ) palpitation, ( - ) chest discomfort, ( + ) extremity swelling Gastrointestinal:  ( - ) nausea, ( - ) heartburn, ( - ) change in bowel habits Skin: ( + ) abnormal skin rashes Lymphatics: ( - ) new lymphadenopathy, ( - ) easy bruising Neurological: ( - ) numbness, ( - ) tingling, ( - ) new weaknesses Behavioral/Psych: ( - ) mood change, ( - ) new changes  All other systems were reviewed with the patient and are negative.  I have reviewed the past medical history, past surgical history, social history and family history with the patient and they are unchanged from previous note.  ALLERGIES:  is allergic to bactrim [sulfamethoxazole-trimethoprim] and amoxicillin.  MEDICATIONS:  Current Outpatient Medications  Medication Sig Dispense Refill  . acyclovir (ZOVIRAX) 400 MG tablet Take 1 tablet (400 mg total) by mouth 2 (two) times daily. 180 tablet 3  . allopurinol (ZYLOPRIM) 300 MG tablet Take 1 tablet (300 mg total) by mouth daily. 90 tablet 3  . clindamycin (CLEOCIN) 150 MG capsule Take 3 capsules (450 mg total) by mouth 3 (three) times daily for 10 days. 90 capsule 0  . dexamethasone (DECADRON) 4 MG tablet Take 2 tablets (8 mg total) by mouth daily for 14 days. 28 tablet 0  .  HYDROcodone-acetaminophen (NORCO) 5-325 MG tablet Take 1-2 tablets by mouth every 6 (six) hours as needed for moderate pain or severe pain. 20 tablet 0  . hydrOXYzine (ATARAX/VISTARIL) 10 MG tablet Take 1 tablet (10 mg total) by mouth 3 (three) times daily as needed for up to 14 days for itching. 30 tablet 2  . ibrutinib (IMBRUVICA) 140 MG capsul Take 3 capsules (420 mg total) by mouth daily. 90 capsule 0   No current facility-administered medications for this visit.     PHYSICAL EXAMINATION: ECOG PERFORMANCE STATUS: 1 - Symptomatic but  completely ambulatory  Today's Vitals   05/31/18 1200  TempSrc: Oral  SpO2: 100%  Weight: 218 lb 8 oz (99.1 kg)  Height: '5\' 10"'  (1.778 m)  PainSc: 0-No pain   Body mass index is 31.35 kg/m.  Filed Weights   05/31/18 1200  Weight: 218 lb 8 oz (99.1 kg)    GENERAL: alert, no distress and comfortable SKIN: faint erythema over the trunk and bilateral lower extremities, significantly improving; patch erythematous rash with some scabs over the bilateral forearms, improving, no purulent drainage EYES: conjunctiva are pink and non-injected, sclera clear OROPHARYNX: no exudate, no erythema; lips, buccal mucosa, and tongue normal  NECK: supple, non-tender LYMPH:  palpable cervical adenopathy ~1-2cm, stable  LUNGS: clear to auscultation and percussion with normal breathing effort HEART: regular rate & rhythm and no murmurs, 2+ bilateral upper extremity swelling, 1+ bilateral lower extremity swelling  ABDOMEN: soft, non-tender, non-distended, normal bowel sounds Musculoskeletal: no cyanosis of digits and no clubbing  PSYCH: alert & oriented x 3, fluent speech NEURO: no focal motor/sensory deficits  LABORATORY DATA:  I have reviewed the data as listed    Component Value Date/Time   NA 141 05/31/2018 1157   NA 142 03/31/2018 1649   K 4.4 05/31/2018 1157   CL 106 05/31/2018 1157   CO2 30 05/31/2018 1157   GLUCOSE 78 05/31/2018 1157   BUN 17 05/31/2018 1157   BUN 16 03/31/2018 1649   CREATININE 1.16 05/31/2018 1157   CALCIUM 8.8 (L) 05/31/2018 1157   PROT 5.1 (L) 05/31/2018 1157   PROT 5.9 (L) 03/31/2018 1649   ALBUMIN 3.9 05/31/2018 1157   ALBUMIN 4.6 03/31/2018 1649   AST 12 (L) 05/31/2018 1157   ALT 11 05/31/2018 1157   ALKPHOS 81 05/31/2018 1157   BILITOT 0.1 (L) 05/31/2018 1157   GFRNONAA >60 05/31/2018 1157   GFRAA >60 05/31/2018 1157    No results found for: SPEP, UPEP  Lab Results  Component Value Date   WBC 274.3 (HH) 05/31/2018   NEUTROABS 2.6 05/31/2018   HGB  9.2 (L) 05/31/2018   HCT 36.1 (L) 05/31/2018   MCV 92.1 05/31/2018   PLT 130 (L) 05/31/2018      Chemistry      Component Value Date/Time   NA 141 05/31/2018 1157   NA 142 03/31/2018 1649   K 4.4 05/31/2018 1157   CL 106 05/31/2018 1157   CO2 30 05/31/2018 1157   BUN 17 05/31/2018 1157   BUN 16 03/31/2018 1649   CREATININE 1.16 05/31/2018 1157      Component Value Date/Time   CALCIUM 8.8 (L) 05/31/2018 1157   ALKPHOS 81 05/31/2018 1157   AST 12 (L) 05/31/2018 1157   ALT 11 05/31/2018 1157   BILITOT 0.1 (L) 05/31/2018 1157

## 2018-06-02 ENCOUNTER — Other Ambulatory Visit: Payer: Self-pay | Admitting: Hematology

## 2018-06-02 DIAGNOSIS — C9110 Chronic lymphocytic leukemia of B-cell type not having achieved remission: Secondary | ICD-10-CM

## 2018-06-04 ENCOUNTER — Encounter: Payer: Self-pay | Admitting: Hematology

## 2018-06-04 ENCOUNTER — Inpatient Hospital Stay: Payer: BLUE CROSS/BLUE SHIELD

## 2018-06-04 ENCOUNTER — Inpatient Hospital Stay (HOSPITAL_BASED_OUTPATIENT_CLINIC_OR_DEPARTMENT_OTHER): Payer: BLUE CROSS/BLUE SHIELD | Admitting: Hematology

## 2018-06-04 ENCOUNTER — Encounter: Payer: Self-pay | Admitting: *Deleted

## 2018-06-04 VITALS — BP 124/63 | HR 81 | Temp 98.3°F | Resp 18 | Ht 70.0 in | Wt 216.0 lb

## 2018-06-04 DIAGNOSIS — Z7952 Long term (current) use of systemic steroids: Secondary | ICD-10-CM | POA: Diagnosis not present

## 2018-06-04 DIAGNOSIS — D649 Anemia, unspecified: Secondary | ICD-10-CM | POA: Diagnosis not present

## 2018-06-04 DIAGNOSIS — L270 Generalized skin eruption due to drugs and medicaments taken internally: Secondary | ICD-10-CM

## 2018-06-04 DIAGNOSIS — M7989 Other specified soft tissue disorders: Secondary | ICD-10-CM

## 2018-06-04 DIAGNOSIS — R161 Splenomegaly, not elsewhere classified: Secondary | ICD-10-CM | POA: Diagnosis not present

## 2018-06-04 DIAGNOSIS — N179 Acute kidney failure, unspecified: Secondary | ICD-10-CM | POA: Diagnosis not present

## 2018-06-04 DIAGNOSIS — C9110 Chronic lymphocytic leukemia of B-cell type not having achieved remission: Secondary | ICD-10-CM

## 2018-06-04 DIAGNOSIS — Z881 Allergy status to other antibiotic agents status: Secondary | ICD-10-CM | POA: Diagnosis not present

## 2018-06-04 DIAGNOSIS — R600 Localized edema: Secondary | ICD-10-CM | POA: Diagnosis not present

## 2018-06-04 DIAGNOSIS — D696 Thrombocytopenia, unspecified: Secondary | ICD-10-CM

## 2018-06-04 DIAGNOSIS — M1712 Unilateral primary osteoarthritis, left knee: Secondary | ICD-10-CM | POA: Diagnosis not present

## 2018-06-04 DIAGNOSIS — Z792 Long term (current) use of antibiotics: Secondary | ICD-10-CM

## 2018-06-04 DIAGNOSIS — Z79899 Other long term (current) drug therapy: Secondary | ICD-10-CM

## 2018-06-04 DIAGNOSIS — D6959 Other secondary thrombocytopenia: Secondary | ICD-10-CM | POA: Diagnosis not present

## 2018-06-04 DIAGNOSIS — Z880 Allergy status to penicillin: Secondary | ICD-10-CM | POA: Diagnosis not present

## 2018-06-04 LAB — CMP (CANCER CENTER ONLY)
ALT: 13 U/L (ref 0–44)
AST: 10 U/L — ABNORMAL LOW (ref 15–41)
Albumin: 4.3 g/dL (ref 3.5–5.0)
Alkaline Phosphatase: 62 U/L (ref 38–126)
Anion gap: 5 (ref 5–15)
BUN: 20 mg/dL (ref 6–20)
CO2: 28 mmol/L (ref 22–32)
Calcium: 9.1 mg/dL (ref 8.9–10.3)
Chloride: 107 mmol/L (ref 98–111)
Creatinine: 1.27 mg/dL — ABNORMAL HIGH (ref 0.61–1.24)
GFR, Est AFR Am: >60 mL/min (ref >60)
GFR, Estimated: >60 mL/min (ref >60)
Glucose, Bld: 116 mg/dL — ABNORMAL HIGH (ref 70–99)
Potassium: 4.5 mmol/L (ref 3.5–5.1)
Sodium: 140 mmol/L (ref 135–145)
Total Bilirubin: 0.4 mg/dL (ref 0.3–1.2)
Total Protein: 5.3 g/dL — ABNORMAL LOW (ref 6.5–8.1)

## 2018-06-04 LAB — CBC WITH DIFFERENTIAL (CANCER CENTER ONLY)
Abs Immature Granulocytes: 0.36 10*3/uL — ABNORMAL HIGH (ref 0.00–0.07)
Basophils Absolute: 0.1 10*3/uL (ref 0.0–0.1)
Basophils Relative: 0 %
Eosinophils Absolute: 0.5 10*3/uL (ref 0.0–0.5)
Eosinophils Relative: 0 %
HCT: 33.6 % — ABNORMAL LOW (ref 39.0–52.0)
Hemoglobin: 8.9 g/dL — ABNORMAL LOW (ref 13.0–17.0)
Immature Granulocytes: 0 %
Lymphocytes Relative: 97 %
Lymphs Abs: 200.4 10*3/uL — ABNORMAL HIGH (ref 0.7–4.0)
MCH: 24.5 pg — ABNORMAL LOW (ref 26.0–34.0)
MCHC: 26.5 g/dL — ABNORMAL LOW (ref 30.0–36.0)
MCV: 92.6 fL (ref 80.0–100.0)
Monocytes Absolute: 2 10*3/uL — ABNORMAL HIGH (ref 0.1–1.0)
Monocytes Relative: 1 %
Neutro Abs: 4.7 10*3/uL (ref 1.7–7.7)
Neutrophils Relative %: 2 %
Platelet Count: 147 10*3/uL — ABNORMAL LOW (ref 150–400)
RBC: 3.63 MIL/uL — ABNORMAL LOW (ref 4.22–5.81)
RDW: 19.4 % — ABNORMAL HIGH (ref 11.5–15.5)
WBC Count: 208 10*3/uL (ref 4.0–10.5)
nRBC: 0 % (ref 0.0–0.2)

## 2018-06-04 LAB — URIC ACID: Uric Acid, Serum: 6.6 mg/dL (ref 3.7–8.6)

## 2018-06-04 NOTE — Progress Notes (Signed)
[place] OFFICE PROGRESS NOTE  Patient Care Team: David Drown, MD as PCP - General (Family Medicine)  HEME/ONC OVERVIEW: 1. CLL with bulky lymphadenopathy; RAI Stage IV, intermediate risk -03/2018:   -PB flow showed CD5+, CD23+ monoclonal B-cells, c/w CLL; IgHV mutation negative  -FISH (PB) positive for DXA(12I78); negative t(11;14), p53 deletion/amplification, and ATM (11q22.3) deletion  -CT neck, CAP showed bulky lymphadenopathy in bilateral cervical LN's and throughout the chest, abdomen and pelvis; marked splenomegaly -04/2018: R axillary excision LN biopsy showed CLL/SLL (cyclin D1 neg) -05/2017 - present: ibrutinib  2. Normocytic anemia 3. Thrombocytopenia  TREATMENT REGIMEN:  Ibrutinib; 05/14/2018 - present   ASSESSMENT & PLAN:   Generalized swelling -Swelling in the bilateral upper and lower extremities has improved significantly since starting dexamethasone 47m daily -I suspect that the swelling was most likely due to ibrutinib-related fluid retention -Given the symptomatic improvement, I advised patient to reduce dexamethasone to 441mdaily -Pending clinical improvement, we may further reduce dexamethasone dose at the next visit -In reviewing the literature on the management of ibrutinib-associated fluid retention, there is no established guideline on duration or dose of steroid -Goal is to wean off steroid as quickly as possible, given the long-term side effects of prolonged steroid usage  Generalized erythematous rash -The rash is overall improving, with some residual punctate erythema over the bilateral forearms -It is unclear whether the rash was caused by allopurinol or Bactrim -While Bactrim is more likely the culprit, given the severity of the rash, I would not recommend resuming either medication -I encouraged patient to complete the 10-day course of clindamycin for suspected superimposed cellulitis  CLL with diffuse lymphadenopathy; RAI Stage  IV, intermediate risk  -WBC 208k today, improving -Continue chemotherapy with ibrutinib for now -If the rash continues to improve, we will consider resuming P JP prophylaxis at the next visit, likely atovaquone -VZV ppx: acyclovir -PRN medications: loperamide, Zofran   Normocytic anemia -Secondary to CLL  -Hgb 8.9 today, stable -We will monitor it for now  Thrombocytopenia -Secondary to CLL -Plts 147k today, improving -We will monitor it for now   Orders Placed This Encounter  Procedures  . CBC with Differential (Cancer Center Only)    Standing Status:   Future    Standing Expiration Date:   07/09/2019  . CMP ([facility]    Standing Status:   Future    Standing Expiration Date:   07/09/2019  . Lactate dehydrogenase    Standing Status:   Future    Standing Expiration Date:   07/09/2019  . Uric acid    Standing Status:   Future    Standing Expiration Date:   07/09/2019   All questions were answered. The patient knows to call the clinic with any problems, questions or concerns. No barriers to learning was detected.  Return in 1 week for labs and clinic follow-up.  David Whitehead 06/04/2018 4:14 PM  CHIEF COMPLAINT: "My rash and swelling are better "  INTERVAL HISTORY: Mr. MaQuinneyeturns to clinic for follow-up of CLL, as well as generalized erythema and swelling.  Patient reports that since starting steroid last week, the swelling in the bilateral upper lower extremities has improved significantly.  Furthermore, the erythematous rash in the upper and lower extremities has also improved.  He denies any new rash, skin desquamation, or mucosal lesion.  He has had a good appetite, and the steroid has not interfered with his sleep or activity.  He denies any symptoms of  acid reflux, abdominal pain, nausea, vomiting, diarrhea.  He still has 3 more days of clindamycin left for the recent suspected cellulitis superimposed on the erythematous rash.  He denies any GI symptoms from  the antibiotic.  Finally, he reports slight increased sensitivity in the hands and feet, but he denies any associated symptoms of neuropathy or weakness.  SUMMARY OF ONCOLOGIC HISTORY:   CLL (chronic lymphocytic leukemia) ([place])   04/01/2018 Initial Diagnosis    CLL (chronic lymphocytic leukemia) ([place])    04/05/2018 Pathology Results    Peripheral Blood Flow Cytometry - MONOCLONAL B-CELL POPULATION IDENTIFIED. - SEE NOTE.    04/05/2018 Pathology Results    PB FISH: No evidence of CCND1-IGH [t(11;14)] gene rearrangement. No evidence of trisomy 11 or gain of 11q. (excludes mantle cell lymphoma) No evidence of trisomy 12.  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 No evidence of p53 (17p13( deletion or amplification. No evidence of ATM (11q22.3) deletion.     04/07/2018 Imaging    CT neck w/ contrast: IMPRESSION: Bulky bilateral cervical lymphadenopathy consistent with lymphoproliferative disease/lymphoma.    04/07/2018 Imaging    CT CAP w/ contrast: IMPRESSION: 1. Bulky lymphadenopathy throughout the chest, abdomen and pelvis, as detailed above, highly suspicious for lymphoma. 2. Marked splenomegaly. 3. 9 mm pulmonary nodule within the anterior inferior portion of the RIGHT upper lobe. Subtle low-density nodule within the LEFT lower lobe. These are too small to definitively characterize. Neoplastic pulmonary nodules cannot be excluded. Consider PET-CT for further characterization.    04/16/2018 Surgery    Right axillary LN excisional biopsy    04/16/2018 Pathology Results    (Accession: [id number]) Lymph node for lymphoma, Right Axillary - CHRONIC LYMPHOCYTIC LEUKEMIA/SMALL LYMPHOCYTIC LYMPHOMA.     REVIEW OF SYSTEMS:   Constitutional: ( - ) fevers, ( - )  chills , ( - ) night sweats Eyes: ( - ) blurriness of vision, ( - ) double vision, ( - ) watery eyes Ears, nose, mouth, throat, and face: ( - ) mucositis, ( - ) sore throat Respiratory: ( - ) cough, (  - ) dyspnea, ( - ) wheezes Cardiovascular: ( - ) palpitation, ( - ) chest discomfort, ( - ) lower extremity swelling Gastrointestinal:  ( - ) nausea, ( - ) heartburn, ( - ) change in bowel habits Skin: ( + ) abnormal skin rashes Lymphatics: ( - ) new lymphadenopathy, ( - ) easy bruising Neurological: ( - ) numbness, ( - ) tingling, ( - ) new weaknesses Behavioral/Psych: ( - ) mood change, ( - ) new changes  All other systems were reviewed with the patient and are negative.  I have reviewed the past medical history, past surgical history, social history and family history with the patient and they are unchanged from previous note.  ALLERGIES:  is allergic to bactrim [sulfamethoxazole-trimethoprim] and amoxicillin.  MEDICATIONS:  Current Outpatient Medications  Medication Sig Dispense Refill  . acyclovir (ZOVIRAX) 400 MG tablet Take 1 tablet (400 mg total) by mouth 2 (two) times daily. 180 tablet 3  . clindamycin (CLEOCIN) 150 MG capsule Take 3 capsules (450 mg total) by mouth 3 (three) times daily for 10 days. 90 capsule 0  . dexamethasone (DECADRON) 4 MG tablet Take 2 tablets (8 mg total) by mouth daily for 14 days. 28 tablet 0  . HYDROcodone-acetaminophen (NORCO) 5-325 MG tablet Take 1-2 tablets by mouth every 6 (six) hours as needed for moderate pain or severe pain. 20 tablet 0  . hydrOXYzine (  ATARAX/VISTARIL) 10 MG tablet Take 1 tablet (10 mg total) by mouth 3 (three) times daily as needed for up to 14 days for itching. 30 tablet 2  . IMBRUVICA 140 MG capsul TAKE 3 CAPSULES (420 MG TOTAL) BY MOUTH DAILY. 90 capsule 0   No current facility-administered medications for this visit.     PHYSICAL EXAMINATION: ECOG PERFORMANCE STATUS: 1 - Symptomatic but completely ambulatory  Today's Vitals   06/04/18 1520  BP: 124/63  Pulse: 81  Resp: 18  Temp: 98.3 F (36.8 C)  TempSrc: Oral  SpO2: 100%  Weight: 216 lb (98 kg)  Height: '5\' 10"'  (1.778 m)  PainSc: 0-No pain   Body mass index is  30.99 kg/m.  Filed Weights   06/04/18 1520  Weight: 216 lb (98 kg)    GENERAL: alert, no distress and comfortable SKIN: scattered punctate erythema in the bilateral upper extremities, improving; very faint erythema over the trunk, improving EYES: conjunctiva are pink and non-injected, sclera clear OROPHARYNX: no exudate, no erythema; lips, buccal mucosa, and tongue normal  NECK: supple, non-tender LYMPH: Stable/mildly improving cervical lymphadenopathy, no definite axillary lymphadenopathy LUNGS: clear to auscultation with normal breathing effort HEART: regular rate & rhythm and no murmurs and no lower extremity edema ABDOMEN: soft, non-tender, non-distended, normal bowel sounds Musculoskeletal: no cyanosis of digits and no clubbing  PSYCH: alert & oriented x 3, fluent speech NEURO: no focal motor/sensory deficits  LABORATORY DATA:  I have reviewed the data as listed    Component Value Date/Time   NA 140 06/04/2018 1510   NA 142 03/31/2018 1649   K 4.5 06/04/2018 1510   CL 107 06/04/2018 1510   CO2 28 06/04/2018 1510   GLUCOSE 116 (H) 06/04/2018 1510   BUN 20 06/04/2018 1510   BUN 16 03/31/2018 1649   CREATININE 1.27 (H) 06/04/2018 1510   CALCIUM 9.1 06/04/2018 1510   PROT 5.3 (L) 06/04/2018 1510   PROT 5.9 (L) 03/31/2018 1649   ALBUMIN 4.3 06/04/2018 1510   ALBUMIN 4.6 03/31/2018 1649   AST 10 (L) 06/04/2018 1510   ALT 13 06/04/2018 1510   ALKPHOS 62 06/04/2018 1510   BILITOT 0.4 06/04/2018 1510   GFRNONAA >60 06/04/2018 1510   GFRAA >60 06/04/2018 1510    No results found for: SPEP, UPEP  Lab Results  Component Value Date   WBC 208.0 (HH) 06/04/2018   NEUTROABS 4.7 06/04/2018   HGB 8.9 (L) 06/04/2018   HCT 33.6 (L) 06/04/2018   MCV 92.6 06/04/2018   PLT 147 (L) 06/04/2018      Chemistry      Component Value Date/Time   NA 140 06/04/2018 1510   NA 142 03/31/2018 1649   K 4.5 06/04/2018 1510   CL 107 06/04/2018 1510   CO2 28 06/04/2018 1510   BUN 20  06/04/2018 1510   BUN 16 03/31/2018 1649   CREATININE 1.27 (H) 06/04/2018 1510      Component Value Date/Time   CALCIUM 9.1 06/04/2018 1510   ALKPHOS 62 06/04/2018 1510   AST 10 (L) 06/04/2018 1510   ALT 13 06/04/2018 1510   BILITOT 0.4 06/04/2018 1510       RADIOGRAPHIC STUDIES: I have personally reviewed the radiological images as listed below and agreed with the findings in the report. Xr Knee 3 View Left  Result Date: 05/20/2018 Three-view x-rays left knee obtained and reviewed.  This shows some flattening of the condyle minimal osteophyte formation some patellofemoral degenerative changes negative for acute fracture.  No lytic or sclerotic lesions. Impression: Mild osteoarthritis primarily patellofemoral, left knee.

## 2018-06-07 LAB — LACTATE DEHYDROGENASE: LDH: 253 U/L — ABNORMAL HIGH (ref 98–192)

## 2018-06-08 MED FILL — IMBRUVICA 140 MG CAPSULE: 140 | 15 days supply | Qty: 45 | Fill #0

## 2018-06-10 ENCOUNTER — Ambulatory Visit (INDEPENDENT_AMBULATORY_CARE_PROVIDER_SITE_OTHER): Payer: BLUE CROSS/BLUE SHIELD | Admitting: Orthopaedic Surgery

## 2018-06-11 ENCOUNTER — Inpatient Hospital Stay: Payer: BLUE CROSS/BLUE SHIELD

## 2018-06-11 ENCOUNTER — Encounter: Payer: Self-pay | Admitting: *Deleted

## 2018-06-11 ENCOUNTER — Telehealth: Payer: Self-pay | Admitting: *Deleted

## 2018-06-11 ENCOUNTER — Inpatient Hospital Stay (HOSPITAL_BASED_OUTPATIENT_CLINIC_OR_DEPARTMENT_OTHER): Payer: BLUE CROSS/BLUE SHIELD | Admitting: Hematology

## 2018-06-11 ENCOUNTER — Other Ambulatory Visit: Payer: Self-pay

## 2018-06-11 ENCOUNTER — Encounter: Payer: Self-pay | Admitting: Hematology

## 2018-06-11 ENCOUNTER — Telehealth: Payer: Self-pay | Admitting: Hematology

## 2018-06-11 VITALS — BP 122/65 | HR 87 | Temp 98.1°F | Resp 18 | Ht 70.0 in | Wt 211.4 lb

## 2018-06-11 DIAGNOSIS — N179 Acute kidney failure, unspecified: Secondary | ICD-10-CM | POA: Diagnosis not present

## 2018-06-11 DIAGNOSIS — D649 Anemia, unspecified: Secondary | ICD-10-CM | POA: Diagnosis not present

## 2018-06-11 DIAGNOSIS — R161 Splenomegaly, not elsewhere classified: Secondary | ICD-10-CM

## 2018-06-11 DIAGNOSIS — M1712 Unilateral primary osteoarthritis, left knee: Secondary | ICD-10-CM | POA: Diagnosis not present

## 2018-06-11 DIAGNOSIS — Z79899 Other long term (current) drug therapy: Secondary | ICD-10-CM

## 2018-06-11 DIAGNOSIS — Z7952 Long term (current) use of systemic steroids: Secondary | ICD-10-CM

## 2018-06-11 DIAGNOSIS — L299 Pruritus, unspecified: Secondary | ICD-10-CM

## 2018-06-11 DIAGNOSIS — C9110 Chronic lymphocytic leukemia of B-cell type not having achieved remission: Secondary | ICD-10-CM | POA: Diagnosis not present

## 2018-06-11 DIAGNOSIS — Z881 Allergy status to other antibiotic agents status: Secondary | ICD-10-CM

## 2018-06-11 DIAGNOSIS — Z880 Allergy status to penicillin: Secondary | ICD-10-CM | POA: Diagnosis not present

## 2018-06-11 DIAGNOSIS — D6959 Other secondary thrombocytopenia: Secondary | ICD-10-CM

## 2018-06-11 DIAGNOSIS — Z792 Long term (current) use of antibiotics: Secondary | ICD-10-CM | POA: Diagnosis not present

## 2018-06-11 DIAGNOSIS — L270 Generalized skin eruption due to drugs and medicaments taken internally: Secondary | ICD-10-CM | POA: Diagnosis not present

## 2018-06-11 DIAGNOSIS — M7989 Other specified soft tissue disorders: Secondary | ICD-10-CM

## 2018-06-11 DIAGNOSIS — R600 Localized edema: Secondary | ICD-10-CM

## 2018-06-11 LAB — CBC WITH DIFFERENTIAL (CANCER CENTER ONLY)
Abs Immature Granulocytes: 0.31 10*3/uL — ABNORMAL HIGH (ref 0.00–0.07)
Basophils Absolute: 0.1 10*3/uL (ref 0.0–0.1)
Basophils Relative: 0 %
Eosinophils Absolute: 0 10*3/uL (ref 0.0–0.5)
Eosinophils Relative: 0 %
HCT: 37.4 % — ABNORMAL LOW (ref 39.0–52.0)
Hemoglobin: 10 g/dL — ABNORMAL LOW (ref 13.0–17.0)
Immature Granulocytes: 0 %
Lymphocytes Relative: 96 %
Lymphs Abs: 203.2 10*3/uL — ABNORMAL HIGH (ref 0.7–4.0)
MCH: 24.4 pg — ABNORMAL LOW (ref 26.0–34.0)
MCHC: 26.7 g/dL — ABNORMAL LOW (ref 30.0–36.0)
MCV: 91.4 fL (ref 80.0–100.0)
Monocytes Absolute: 1.2 10*3/uL — ABNORMAL HIGH (ref 0.1–1.0)
Monocytes Relative: 1 %
Neutro Abs: 5.6 10*3/uL (ref 1.7–7.7)
Neutrophils Relative %: 3 %
Platelet Count: 226 10*3/uL (ref 150–400)
RBC: 4.09 MIL/uL — ABNORMAL LOW (ref 4.22–5.81)
RDW: 20.4 % — ABNORMAL HIGH (ref 11.5–15.5)
WBC Count: 210.4 10*3/uL (ref 4.0–10.5)
nRBC: 0 % (ref 0.0–0.2)

## 2018-06-11 LAB — CMP (CANCER CENTER ONLY)
ALT: 15 U/L (ref 0–44)
AST: 12 U/L — ABNORMAL LOW (ref 15–41)
Albumin: 4.7 g/dL (ref 3.5–5.0)
Alkaline Phosphatase: 62 U/L (ref 38–126)
Anion gap: 7 (ref 5–15)
BUN: 16 mg/dL (ref 6–20)
CO2: 27 mmol/L (ref 22–32)
Calcium: 9.1 mg/dL (ref 8.9–10.3)
Chloride: 105 mmol/L (ref 98–111)
Creatinine: 0.95 mg/dL (ref 0.61–1.24)
GFR, Est AFR Am: >60 mL/min (ref >60)
GFR, Estimated: >60 mL/min (ref >60)
Glucose, Bld: 152 mg/dL — ABNORMAL HIGH (ref 70–99)
Potassium: 4.3 mmol/L (ref 3.5–5.1)
Sodium: 139 mmol/L (ref 135–145)
Total Bilirubin: 0.4 mg/dL (ref 0.3–1.2)
Total Protein: 6 g/dL — ABNORMAL LOW (ref 6.5–8.1)

## 2018-06-11 LAB — URIC ACID: Uric Acid, Serum: 5.7 mg/dL (ref 3.7–8.6)

## 2018-06-11 MED ORDER — DEXAMETHASONE 4 MG PO TABS: 2.0000 mg | ORAL_TABLET | ORAL | 0 refills | Status: DC

## 2018-06-11 MED ORDER — ATOVAQUONE 750 MG/5ML PO SUSP: 1500.0000 mg | Freq: Every day | ORAL | 5 refills | Status: AC

## 2018-06-11 NOTE — Progress Notes (Signed)
[place] OFFICE PROGRESS NOTE  Patient Care Team: [name], MD as PCP - General (Family Medicine)  HEME/ONC OVERVIEW: 1. CLL with bulky lymphadenopathy; RAI Stage IV, intermediate risk -03/2018:   -PB flow showed CD5+, CD23+ monoclonal B-cells, c/w CLL; IgHV mutation negative  -FISH (PB) positive for SPQ(33A07); negative t(11;14), p53 deletion/amplification, and ATM (11q22.3) deletion  -CT neck, CAP showed bulky lymphadenopathy in bilateral cervical LN's and throughout the chest, abdomen and pelvis; marked splenomegaly -04/2018: R axillary excision LN biopsy showed CLL/SLL (cyclin D1 neg) -05/2017 - present: ibrutinib  2. Normocytic anemia 3. Thrombocytopenia, resolved   TREATMENT REGIMEN:  Ibrutinib; 05/14/2018 - present   ASSESSMENT & PLAN:   CLL with diffuse lymphadenopathy; RAI Stage IV, intermediate risk  -Patient is currently tolerating ibrutinib relatively well; fluid retention resolved with dexamethasone -WBC 210k today, stable -Continue ibrutinib; no dose adjustment needed at this time -As patient likely had drug reaction with Bactrim, I have prescribed atovaquone for PJP prophylaxis -VZV prophylaxis: acyclovir   Fluid retention syndrome -Secondary to ibrutinib -Generalized swelling has nearly completed resolved -He is currently on dexamethasone 63m daily -I have instructed the patient to take dexamethasone 271mdaily x 5 days, followed by 22m1mvery other day x 5 days, and then stop -He has some extra steroid tabs at home in the event he develops recurrent swelling after stopping steroid; he understands to call the clinic before resuming steroid   Generalized erythematous rash -Possibly due to Bactrim vs. Allopurinol -Erythematous rash has nearly completely resolved -Atovaquone to substitute for Bactrim for PJP prophylaxis as above   Normocytic anemia -Secondary to CLL -Hgb 10.0 today, improving -Patient denies any symptoms of bleeding -We  will monitor for now   Orders Placed This Encounter  Procedures  . CBC with Differential (Cancer Center Only)    Standing Status:   Future    Standing Expiration Date:   07/16/2019  . [facility]MP ([facility])    Standing Status:   Future    Standing Expiration Date:   07/16/2019  . Save Smear ([facility])    Standing Status:   Future    Standing Expiration Date:   06/12/2019  . Lactate dehydrogenase    Standing Status:   Future    Standing Expiration Date:   07/16/2019  . Uric acid    Standing Status:   Future    Standing Expiration Date:   07/16/2019   All questions were answered. The patient knows to call the clinic with any problems, questions or concerns. No barriers to learning was detected.  Return in 3 weeks for labs and clinic follow-up.   YanTish MenD 06/11/2018 3:37 PM  CHIEF COMPLAINT: "I am doing a little better "  INTERVAL HISTORY: Mr. ManClecklerturns to clinic for follow-up of CLL on ibrutinib.  Patient reports that he is tolerating dexamethasone 4 mg daily well without significant side effects.  His swelling in the bilateral upper and lower extremities has nearly completely resolved.  He still has very faint erythematous rash, but it has also improved significantly.  He denies any new skin lesion or desquamation.  He is otherwise tolerating ibrutinib well without significant GI side effects, palpitation, or abnormal bleeding/bruising.  SUMMARY OF ONCOLOGIC HISTORY:   [facility] [place] [place] 04/[facility]2018 Initial Diagnosis    [facility] [place]  04/05/2018 Pathology Results    Peripheral Blood Flow Cytometry - MONOCLONAL B-CELL POPULATION IDENTIFIED. - SEE NOTE.  04/05/2018 Pathology Results    PB FISH: No evidence of CCND1-IGH [t(11;14)] gene rearrangement. No evidence of trisomy 11 or gain of 11q. (excludes mantle cell lymphoma) No evidence of trisomy 12.  Mono-allelic deletion of [id number] (13q14.3) locus is detected. No  evidence of p53 (17p13( deletion or amplification. No evidence of ATM (11q22.3) deletion.     04/07/2018 Imaging    CT neck w/ contrast: IMPRESSION: Bulky bilateral cervical lymphadenopathy consistent with lymphoproliferative disease/lymphoma.    04/07/2018 Imaging    CT CAP w/ contrast: IMPRESSION: 1. Bulky lymphadenopathy throughout the chest, abdomen and pelvis, as detailed above, highly suspicious for lymphoma. 2. Marked splenomegaly. 3. 9 mm pulmonary nodule within the anterior inferior portion of the RIGHT upper lobe. Subtle low-density nodule within the LEFT lower lobe. These are too small to definitively characterize. Neoplastic pulmonary nodules cannot be excluded. Consider PET-CT for further characterization.    04/16/2018 Surgery    Right axillary LN excisional biopsy    04/16/2018 Pathology Results    (Accession: [id number]) Lymph node for lymphoma, Right Axillary - CHRONIC LYMPHOCYTIC LEUKEMIA/SMALL LYMPHOCYTIC LYMPHOMA.     REVIEW OF SYSTEMS:   Constitutional: ( - ) fevers, ( - )  chills , ( - ) night sweats Eyes: ( - ) blurriness of vision, ( - ) double vision, ( - ) watery eyes Ears, nose, mouth, throat, and face: ( - ) mucositis, ( - ) sore throat Respiratory: ( - ) cough, ( - ) dyspnea, ( - ) wheezes Cardiovascular: ( - ) palpitation, ( - ) chest discomfort, ( - ) lower extremity swelling Gastrointestinal:  ( - ) nausea, ( - ) heartburn, ( - ) change in bowel habits Skin: ( + ) abnormal skin rashes, improving Lymphatics: ( - ) new lymphadenopathy, ( - ) easy bruising Neurological: ( - ) numbness, ( - ) tingling, ( - ) new weaknesses Behavioral/Psych: ( - ) mood change, ( - ) new changes  All other systems were reviewed with the patient and are negative.  I have reviewed the past medical history, past surgical history, social history and family history with the patient and they are unchanged from previous note.  ALLERGIES:  is allergic to bactrim  [sulfamethoxazole-trimethoprim] and amoxicillin.  MEDICATIONS:  Current Outpatient Medications  Medication Sig Dispense Refill  . acyclovir (ZOVIRAX) 400 MG tablet Take 1 tablet (400 mg total) by mouth 2 (two) times daily. 180 tablet 3  . dexamethasone (DECADRON) 4 MG tablet Take 0.5 tablets (2 mg total) by mouth See admin instructions. 33m daily x 5 days, then 217mevery other day x 5 days, then stop 24 tablet 0  . HYDROcodone-acetaminophen (NORCO) 5-325 MG tablet Take 1-2 tablets by mouth every 6 (six) hours as needed for moderate pain or severe pain. 20 tablet 0  . IMBRUVICA 140 MG capsul TAKE 3 CAPSULES (420 MG TOTAL) BY MOUTH DAILY. 90 capsule 0  . atovaquone (MEPRON) 750 MG/5ML suspension Take 10 mLs (1,500 mg total) by mouth daily for 30 days. 300 mL 5   No current facility-administered medications for this visit.     PHYSICAL EXAMINATION: ECOG PERFORMANCE STATUS: 0 - Asymptomatic  Today's Vitals   06/11/18 1445 06/11/18 1447  BP: 122/65   Pulse: 87   Resp: 18   Temp: 98.1 F (36.7 C)   TempSrc: Oral   SpO2: 100%   Weight: 211 lb 6.4 oz (95.9 kg)   Height: _0  (1.778 m)   PainSc: 0-No pain 0-No pain  Body mass index is 30.33 kg/m.  Filed Weights   06/11/18 1445  Weight: 211 lb 6.4 oz (95.9 kg)    GENERAL: alert, no distress and comfortable SKIN: skin color, texture, turgor are normal, very faint erythematous rash over the upper extremities, no desquamation or mucosal lesions  EYES: conjunctiva are pink and non-injected, sclera clear OROPHARYNX: no exudate, no erythema; lips, buccal mucosa, and tongue normal  NECK: supple, non-tender LYMPH:  Improving cervical LN's, no new lymphadenopathy LUNGS: clear to auscultation with normal breathing effort HEART: regular rate & rhythm and no murmurs and no lower extremity edema ABDOMEN: soft, non-tender, non-distended, normal bowel sounds Musculoskeletal: no cyanosis of digits and no clubbing  PSYCH: alert & oriented x 3,  fluent speech NEURO: no focal motor/sensory deficits  LABORATORY DATA:  I have reviewed the data as listed    Component Value Date/Time   NA 139 06/11/2018 1427   NA 142 03/31/2018 1649   K 4.3 06/11/2018 1427   CL 105 06/11/2018 1427   CO2 27 06/11/2018 1427   GLUCOSE 152 (H) 06/11/2018 1427   BUN 16 06/11/2018 1427   BUN 16 03/31/2018 1649   CREATININE 0.95 06/11/2018 1427   CALCIUM 9.1 06/11/2018 1427   PROT 6.0 (L) 06/11/2018 1427   PROT 5.9 (L) 03/31/2018 1649   ALBUMIN 4.7 06/11/2018 1427   ALBUMIN 4.6 03/31/2018 1649   AST 12 (L) 06/11/2018 1427   ALT 15 06/11/2018 1427   ALKPHOS 62 06/11/2018 1427   BILITOT 0.4 06/11/2018 1427   GFRNONAA >60 06/11/2018 1427   GFRAA >60 06/11/2018 1427    No results found for: SPEP, UPEP  Lab Results  Component Value Date   WBC 210.4 (HH) 06/11/2018   NEUTROABS 5.6 06/11/2018   HGB 10.0 (L) 06/11/2018   HCT 37.4 (L) 06/11/2018   MCV 91.4 06/11/2018   PLT 226 06/11/2018      Chemistry      Component Value Date/Time   NA 139 06/11/2018 1427   NA 142 03/31/2018 1649   K 4.3 06/11/2018 1427   CL 105 06/11/2018 1427   CO2 27 06/11/2018 1427   BUN 16 06/11/2018 1427   BUN 16 03/31/2018 1649   CREATININE 0.95 06/11/2018 1427      Component Value Date/Time   CALCIUM 9.1 06/11/2018 1427   ALKPHOS 62 06/11/2018 1427   AST 12 (L) 06/11/2018 1427   ALT 15 06/11/2018 1427   BILITOT 0.4 06/11/2018 1427       RADIOGRAPHIC STUDIES: I have personally reviewed the radiological images as listed below and agreed with the findings in the report. Xr Knee 3 View Left  Result Date: 05/20/2018 Three-view x-rays left knee obtained and reviewed.  This shows some flattening of the condyle minimal osteophyte formation some patellofemoral degenerative changes negative for acute fracture.  No lytic or sclerotic lesions. Impression: Mild osteoarthritis primarily patellofemoral, left knee.

## 2018-06-11 NOTE — Telephone Encounter (Signed)
Critical Value WBC 210.4 Dr Maylon Peppers notified. No orders at this time.

## 2018-06-11 NOTE — Telephone Encounter (Signed)
Appointments scheduled patient declined avs/calendar he has access to my chart per 1/31 los

## 2018-06-14 LAB — LACTATE DEHYDROGENASE: LDH: 154 U/L (ref 98–192)

## 2018-06-15 ENCOUNTER — Encounter: Payer: Self-pay | Admitting: Hematology

## 2018-06-15 ENCOUNTER — Other Ambulatory Visit: Payer: Self-pay | Admitting: Hematology

## 2018-06-15 DIAGNOSIS — J019 Acute sinusitis, unspecified: Secondary | ICD-10-CM

## 2018-06-15 MED ORDER — DOXYCYCLINE HYCLATE 100 MG PO TABS: 100.0000 mg | ORAL_TABLET | Freq: Two times a day (BID) | ORAL | 0 refills | Status: AC

## 2018-06-22 MED FILL — IMBRUVICA 140 MG CAPSULE: 140 | 15 days supply | Qty: 45 | Fill #1

## 2018-06-30 ENCOUNTER — Other Ambulatory Visit: Payer: Self-pay | Admitting: Hematology

## 2018-06-30 DIAGNOSIS — C9110 Chronic lymphocytic leukemia of B-cell type not having achieved remission: Secondary | ICD-10-CM

## 2018-06-30 NOTE — Telephone Encounter (Signed)
OK to refill this pt's Imbruvica?

## 2018-07-02 ENCOUNTER — Inpatient Hospital Stay: Payer: BLUE CROSS/BLUE SHIELD | Attending: Hematology

## 2018-07-02 ENCOUNTER — Telehealth: Payer: Self-pay | Admitting: Hematology

## 2018-07-02 ENCOUNTER — Encounter: Payer: Self-pay | Admitting: Hematology

## 2018-07-02 ENCOUNTER — Inpatient Hospital Stay (HOSPITAL_BASED_OUTPATIENT_CLINIC_OR_DEPARTMENT_OTHER): Payer: BLUE CROSS/BLUE SHIELD | Admitting: Hematology

## 2018-07-02 ENCOUNTER — Other Ambulatory Visit: Payer: Self-pay

## 2018-07-02 ENCOUNTER — Telehealth: Payer: Self-pay | Admitting: *Deleted

## 2018-07-02 VITALS — BP 106/66 | HR 92 | Temp 98.2°F | Wt 185.5 lb

## 2018-07-02 DIAGNOSIS — R7989 Other specified abnormal findings of blood chemistry: Secondary | ICD-10-CM | POA: Insufficient documentation

## 2018-07-02 DIAGNOSIS — D649 Anemia, unspecified: Secondary | ICD-10-CM | POA: Diagnosis not present

## 2018-07-02 DIAGNOSIS — R51 Headache: Secondary | ICD-10-CM | POA: Diagnosis not present

## 2018-07-02 DIAGNOSIS — R5381 Other malaise: Secondary | ICD-10-CM | POA: Diagnosis not present

## 2018-07-02 DIAGNOSIS — C9110 Chronic lymphocytic leukemia of B-cell type not having achieved remission: Secondary | ICD-10-CM

## 2018-07-02 DIAGNOSIS — K521 Toxic gastroenteritis and colitis: Secondary | ICD-10-CM

## 2018-07-02 DIAGNOSIS — M7989 Other specified soft tissue disorders: Secondary | ICD-10-CM

## 2018-07-02 DIAGNOSIS — R531 Weakness: Secondary | ICD-10-CM | POA: Diagnosis not present

## 2018-07-02 DIAGNOSIS — D75839 Thrombocytosis, unspecified: Secondary | ICD-10-CM

## 2018-07-02 DIAGNOSIS — R2689 Other abnormalities of gait and mobility: Secondary | ICD-10-CM | POA: Insufficient documentation

## 2018-07-02 DIAGNOSIS — E441 Mild protein-calorie malnutrition: Secondary | ICD-10-CM | POA: Insufficient documentation

## 2018-07-02 DIAGNOSIS — R634 Abnormal weight loss: Secondary | ICD-10-CM

## 2018-07-02 DIAGNOSIS — R5383 Other fatigue: Secondary | ICD-10-CM | POA: Insufficient documentation

## 2018-07-02 DIAGNOSIS — R682 Dry mouth, unspecified: Secondary | ICD-10-CM | POA: Insufficient documentation

## 2018-07-02 DIAGNOSIS — E46 Unspecified protein-calorie malnutrition: Secondary | ICD-10-CM | POA: Insufficient documentation

## 2018-07-02 DIAGNOSIS — E871 Hypo-osmolality and hyponatremia: Secondary | ICD-10-CM

## 2018-07-02 DIAGNOSIS — Z79899 Other long term (current) drug therapy: Secondary | ICD-10-CM

## 2018-07-02 DIAGNOSIS — D473 Essential (hemorrhagic) thrombocythemia: Secondary | ICD-10-CM

## 2018-07-02 LAB — CBC WITH DIFFERENTIAL (CANCER CENTER ONLY)
Abs Immature Granulocytes: 0.27 10*3/uL — ABNORMAL HIGH (ref 0.00–0.07)
Basophils Absolute: 0.1 10*3/uL (ref 0.0–0.1)
Basophils Relative: 0 %
Eosinophils Absolute: 0 10*3/uL (ref 0.0–0.5)
Eosinophils Relative: 0 %
HCT: 35.1 % — ABNORMAL LOW (ref 39.0–52.0)
Hemoglobin: 10.4 g/dL — ABNORMAL LOW (ref 13.0–17.0)
Immature Granulocytes: 0 %
Lymphocytes Relative: 94 %
Lymphs Abs: 128.2 10*3/uL — ABNORMAL HIGH (ref 0.7–4.0)
MCH: 24.2 pg — ABNORMAL LOW (ref 26.0–34.0)
MCHC: 29.6 g/dL — ABNORMAL LOW (ref 30.0–36.0)
MCV: 81.8 fL (ref 80.0–100.0)
Monocytes Absolute: 1 10*3/uL (ref 0.1–1.0)
Monocytes Relative: 1 %
Neutro Abs: 6.9 10*3/uL (ref 1.7–7.7)
Neutrophils Relative %: 5 %
Platelet Count: 451 10*3/uL — ABNORMAL HIGH (ref 150–400)
RBC: 4.29 MIL/uL (ref 4.22–5.81)
RDW: 18.7 % — ABNORMAL HIGH (ref 11.5–15.5)
WBC Count: 136.5 10*3/uL (ref 4.0–10.5)
nRBC: 0 % (ref 0.0–0.2)

## 2018-07-02 LAB — CMP (CANCER CENTER ONLY)
ALT: 52 U/L — ABNORMAL HIGH (ref 0–44)
AST: 25 U/L (ref 15–41)
Albumin: 3.6 g/dL (ref 3.5–5.0)
Alkaline Phosphatase: 45 U/L (ref 38–126)
Anion gap: 10 (ref 5–15)
BUN: 24 mg/dL — ABNORMAL HIGH (ref 6–20)
CO2: 22 mmol/L (ref 22–32)
Calcium: 8.9 mg/dL (ref 8.9–10.3)
Chloride: 101 mmol/L (ref 98–111)
Creatinine: 1.03 mg/dL (ref 0.61–1.24)
GFR, Est AFR Am: >60 mL/min (ref >60)
GFR, Estimated: >60 mL/min (ref >60)
Glucose, Bld: 122 mg/dL — ABNORMAL HIGH (ref 70–99)
Potassium: 4 mmol/L (ref 3.5–5.1)
Sodium: 133 mmol/L — ABNORMAL LOW (ref 135–145)
Total Bilirubin: 0.5 mg/dL (ref 0.3–1.2)
Total Protein: 5.7 g/dL — ABNORMAL LOW (ref 6.5–8.1)

## 2018-07-02 LAB — URIC ACID: Uric Acid, Serum: 4.2 mg/dL (ref 3.7–8.6)

## 2018-07-02 LAB — SAVE SMEAR(SSMR), FOR PROVIDER SLIDE REVIEW

## 2018-07-02 NOTE — Telephone Encounter (Signed)
Dr. Maylon Peppers notified of critical WBC count of 136.5.

## 2018-07-02 NOTE — Progress Notes (Signed)
[name] OFFICE PROGRESS NOTE  Patient Care Team: [name], MD as PCP - General (Family Medicine) Cordelia Poche, RN as Oncology Nurse Navigator [name], MD as Medical Oncologist (Hematology)  HEME/ONC OVERVIEW: 1. CLL with bulky lymphadenopathy; RAI Stage IV, intermediate risk -03/2018:   PB flow showed CD5+, CD23+ monoclonal B-cells, c/w CLL; IgHV mutation negative  FISH (PB) positive for XNA(35T73); negative t(11;14), p53 deletion/amplification, and ATM (11q22.3) deletion  CT neck, CAP showed bulky lymphadenopathy in bilateral cervical LN's and throughout the chest, abdomen and pelvis; marked splenomegaly -04/2018: R axillary excision LN biopsy showed CLL/SLL (cyclin D1 neg) -05/2017 - present: ibrutinib   2. Normocytic anemia  TREATMENT REGIMEN:  05/14/2018 - present: ibrutinib 433m daily  ASSESSMENT & PLAN:   CLL with diffuse lymphadenopathy; RAI Stage IV, intermediate risk  -WBC improving and is 136k; lymphadenopathy resolved -See the management of side effects below -Continue ibrutinib 4217mdaily for now -If the side effects worsen, such as fatigue, generalized weakness, or diarrhea, we may have to consider a treatment holiday or reduce the dose to 28019maily  -VZV prophylaxis: acyclovir  -PJP ppx: atovaquone   Fluid retention syndrome -Secondary to ibrutinib -Did not response to diuretics; improved with dexamethasone -No evidence of fluid retention on exam today -Patient will finish the steroid taper this week   Diarrhea -Secondary to ibrutinib -No hematochezia or melena -I encouraged the patient to use PRN Imodium, up to 2 tabs every 2 hours as needed, until resolution of diarrhea  Generalized weakness, fatigue -Secondary to ibrutinib -Overall, patient feels that his fatigue is slowly improving over the past a few days -ECG in [facility] showed normal sinus rhythm with occasional PAC's  -If fatigue becomes severe enough to interfere  with ADL's, we may have to consider dose reduction of ibrutinib    Mild protein malnutrition -Patient has lost ~20 lbs since starting ibruitinib in early 05/2018 -He is drinking a few protein supplements per day -We discussed different strategies to maintain/improve his weight, including increasing nutritional supplements that are high in calorie (Ensure, Boosts, etc.) -If he continues to lose weight, we will refer him to nutrition for further recommendations  Hyponatremia -Na 133 today, new -Patient denies any symptoms associated with hyponatremia -He is drinking a lot of water due to dry mouth; I encourage the patient to increase his dietary salt intake, and to supplement with Gatorade   Normocytic anemia -Secondary to CLL and ibrutinib  -Hgb 10.4 today, stable -Patient denies any symptoms of bleeding  -We will monitor it for now  Thrombocytosis -Plts 451k today, new -Patient denies any symptoms of DVT or PTE -We will monitor it for now  Orders Placed This Encounter  Procedures  . CBC with Differential (Cancer Center Only)    Standing Status:   Future    Standing Expiration Date:   08/06/2019  . [facility]MP ([facility])    Standing Status:   Future    Standing Expiration Date:   08/06/2019  . Lactate dehydrogenase    Standing Status:   Future    Standing Expiration Date:   08/06/2019  . Uric acid    Standing Status:   Future    Standing Expiration Date:   08/06/2019  . EKG 12-Lead  . EKG 12-Lead    Ordered by an unspecified provider     All questions were answered. The patient knows to call the [facility] with any problems, questions or concerns. No barriers to learning was  detected.  Return in 2 weeks for labs and [facility].   [name], MD 07/02/2018 3:37 PM  CHIEF COMPLAINT: "I have lost quite a bit of weight "  INTERVAL HISTORY: [facility] of CLL on ibrutinib.  Patient reports that over the past few weeks, he has had  intermittent diarrhea, usually in the morning.  He takes 2 tablets of Imodium in the morning, which helps reduce the frequency of diarrhea from 4-5 times per day to 2-3 times per day.  The diarrhea is usually low-volume, and not associated with any abdominal pain, nausea, vomiting, hematochezia, or melena.  He reports mild persistent dry mouth and drinks a lot of water.  He reports mild persistent generalized weakness fatigue, as well as slight gait instability, but overall over the past few days, the symptoms have been improving.  He also reports a mild intermittent headache, usually frontal, nonradiating, and not associated with any other symptoms, such as vision change.  It usually lasts a few minutes, and resolves without any intervention.  He has lost almost 20 pounds since starting treatment in 05/2018, and has been trying to drink protein supplements due to weight loss.  Over the past week, he has gained about 3 pounds back.  He denies any other complaint today.  SUMMARY OF ONCOLOGIC HISTORY:   CLL (chronic lymphocytic leukemia) ([facility])   04/01/2018 Initial Diagnosis    CLL (chronic lymphocytic leukemia) ([facility])    04/05/2018 Pathology Results    Peripheral Blood Flow Cytometry - MONOCLONAL B-CELL POPULATION IDENTIFIED. - SEE NOTE.    04/05/2018 Pathology Results    PB FISH: No evidence of CCND1-IGH [t(11;14)] gene rearrangement. No evidence of trisomy 11 or gain of 11q. (excludes mantle cell lymphoma) No evidence of trisomy 12.  Mono-allelic deletion of [id number] (13q14.3) locus is detected. No evidence of p53 (17p13( deletion or amplification. No evidence of ATM (11q22.3) deletion.     04/07/2018 Imaging    CT neck w/ contrast: IMPRESSION: Bulky bilateral cervical lymphadenopathy consistent with lymphoproliferative disease/lymphoma.    04/07/2018 Imaging    CT CAP w/ contrast: IMPRESSION: 1. Bulky lymphadenopathy throughout the chest, abdomen and pelvis, as detailed above, highly  suspicious for lymphoma. 2. Marked splenomegaly. 3. 9 mm pulmonary nodule within the anterior inferior portion of the RIGHT upper lobe. Subtle low-density nodule within the LEFT lower lobe. These are too small to definitively characterize. Neoplastic pulmonary nodules cannot be excluded. Consider PET-CT for further characterization.    04/16/2018 Surgery    Right axillary LN excisional biopsy    04/16/2018 Pathology Results    (Accession: [id number]) Lymph node for lymphoma, Right Axillary - CHRONIC LYMPHOCYTIC LEUKEMIA/SMALL LYMPHOCYTIC LYMPHOMA.     REVIEW OF SYSTEMS:   Constitutional: ( - ) fevers, ( - )  chills , ( - ) night sweats Eyes: ( - ) blurriness of vision, ( - ) double vision, ( - ) watery eyes Ears, nose, mouth, throat, and face: ( - ) mucositis, ( - ) sore throat Respiratory: ( - ) cough, ( - ) dyspnea, ( - ) wheezes Cardiovascular: ( - ) palpitation, ( - ) chest discomfort, ( - ) lower extremity swelling Gastrointestinal:  ( - ) nausea, ( - ) heartburn, ( + ) change in bowel habits Skin: ( - ) abnormal skin rashes Lymphatics: ( - ) new lymphadenopathy, ( - ) easy bruising Neurological: ( - ) numbness, ( - ) tingling, ( + ) new weaknesses Behavioral/Psych: ( - )  mood change, ( - ) new changes  All other systems were reviewed with the patient and are negative.  I have reviewed the past medical history, past surgical history, social history and family history with the patient and they are unchanged from previous note.  ALLERGIES:  is allergic to bactrim [sulfamethoxazole-trimethoprim] and amoxicillin.  MEDICATIONS:  Current Outpatient Medications  Medication Sig Dispense Refill  . acyclovir (ZOVIRAX) 400 MG tablet Take 1 tablet (400 mg total) by mouth 2 (two) times daily. 180 tablet 3  . allopurinol (ZYLOPRIM) 300 MG tablet Take 300 mg by mouth daily.    Marland Kitchen atovaquone (MEPRON) 750 MG/5ML suspension Take 10 mLs (1,500 mg total) by mouth daily for 30 days. 300 mL 5  .  dexamethasone (DECADRON) 4 MG tablet Take 0.5 tablets (2 mg total) by mouth See admin instructions. 13m daily x 5 days, then 211mevery other day x 5 days, then stop 24 tablet 0  . HYDROcodone-acetaminophen (NORCO) 5-325 MG tablet Take 1-2 tablets by mouth every 6 (six) hours as needed for moderate pain or severe pain. 20 tablet 0  . IMBRUVICA 140 MG capsul TAKE 3 CAPSULES (420 MG TOTAL) BY MOUTH DAILY. 90 capsule 0   No current facility-administered medications for this visit.     PHYSICAL EXAMINATION: ECOG PERFORMANCE STATUS: 1 - Symptomatic but completely ambulatory  Today's Vitals   07/02/18 1400  BP: 106/66  Pulse: 92  Temp: 98.2 F (36.8 C)  TempSrc: Oral  SpO2: 100%  Weight: 185 lb 8 oz (84.1 kg)   Body mass index is 26.62 kg/m.  Filed Weights   07/02/18 1400  Weight: 185 lb 8 oz (84.1 kg)    GENERAL: alert, no distress and comfortable, thinner than last visit  SKIN: skin color, texture, turgor are normal, no rashes or significant lesions EYES: conjunctiva are pink and non-injected, sclera clear OROPHARYNX: no exudate, no erythema; lips, buccal mucosa, and tongue normal  NECK: supple, non-tender LYMPH:  small cervical LN's, <1cm LUNGS: clear to auscultation with normal breathing effort HEART: regular rate & rhythm and no murmurs and no lower extremity edema ABDOMEN: soft, non-tender, non-distended, normal bowel sounds Musculoskeletal: no cyanosis of digits and no clubbing  PSYCH: alert & oriented x 3, fluent speech NEURO: no focal motor/sensory deficits  LABORATORY DATA:  I have reviewed the data as listed    Component Value Date/Time   NA 133 (L) 07/02/2018 1435   NA 142 03/31/2018 1649   K 4.0 07/02/2018 1435   CL 101 07/02/2018 1435   CO2 22 07/02/2018 1435   GLUCOSE 122 (H) 07/02/2018 1435   BUN 24 (H) 07/02/2018 1435   BUN 16 03/31/2018 1649   CREATININE 1.03 07/02/2018 1435   CALCIUM 8.9 07/02/2018 1435   PROT 5.7 (L) 07/02/2018 1435   PROT 5.9 (L)  03/31/2018 1649   ALBUMIN 3.6 07/02/2018 1435   ALBUMIN 4.6 03/31/2018 1649   AST 25 07/02/2018 1435   ALT 52 (H) 07/02/2018 1435   ALKPHOS 45 07/02/2018 1435   BILITOT 0.5 07/02/2018 1435   GFRNONAA >60 07/02/2018 1435   GFRAA >60 07/02/2018 1435    No results found for: SPEP, UPEP  Lab Results  Component Value Date   WBC 136.5 (HH) 07/02/2018   NEUTROABS 6.9 07/02/2018   HGB 10.4 (L) 07/02/2018   HCT 35.1 (L) 07/02/2018   MCV 81.8 07/02/2018   PLT 451 (H) 07/02/2018      Chemistry      Component Value Date/Time  NA 133 (L) 07/02/2018 1435   NA 142 03/31/2018 1649   K 4.0 07/02/2018 1435   CL 101 07/02/2018 1435   CO2 22 07/02/2018 1435   BUN 24 (H) 07/02/2018 1435   BUN 16 03/31/2018 1649   CREATININE 1.03 07/02/2018 1435      Component Value Date/Time   CALCIUM 8.9 07/02/2018 1435   ALKPHOS 45 07/02/2018 1435   AST 25 07/02/2018 1435   ALT 52 (H) 07/02/2018 1435   BILITOT 0.5 07/02/2018 1435

## 2018-07-02 NOTE — Telephone Encounter (Signed)
Appointments scheduled letter/calendar mailed per 2/21 los

## 2018-07-05 ENCOUNTER — Other Ambulatory Visit: Payer: Self-pay | Admitting: Family

## 2018-07-05 ENCOUNTER — Other Ambulatory Visit: Payer: Self-pay | Admitting: Hematology

## 2018-07-05 LAB — LACTATE DEHYDROGENASE: LDH: 177 U/L (ref 98–192)

## 2018-07-07 MED FILL — IMBRUVICA 140 MG CAPSULE: 140 | 30 days supply | Qty: 90 | Fill #0

## 2018-07-16 ENCOUNTER — Other Ambulatory Visit: Payer: Self-pay

## 2018-07-16 ENCOUNTER — Inpatient Hospital Stay (HOSPITAL_BASED_OUTPATIENT_CLINIC_OR_DEPARTMENT_OTHER): Payer: BLUE CROSS/BLUE SHIELD | Admitting: Hematology

## 2018-07-16 ENCOUNTER — Telehealth: Payer: Self-pay | Admitting: Hematology

## 2018-07-16 ENCOUNTER — Inpatient Hospital Stay: Payer: BLUE CROSS/BLUE SHIELD | Attending: Hematology

## 2018-07-16 ENCOUNTER — Encounter: Payer: Self-pay | Admitting: Hematology

## 2018-07-16 VITALS — BP 118/68 | HR 88 | Temp 98.0°F | Resp 18 | Ht 70.0 in | Wt 194.0 lb

## 2018-07-16 DIAGNOSIS — D649 Anemia, unspecified: Secondary | ICD-10-CM | POA: Insufficient documentation

## 2018-07-16 DIAGNOSIS — Z79899 Other long term (current) drug therapy: Secondary | ICD-10-CM | POA: Insufficient documentation

## 2018-07-16 DIAGNOSIS — L299 Pruritus, unspecified: Secondary | ICD-10-CM

## 2018-07-16 DIAGNOSIS — C9110 Chronic lymphocytic leukemia of B-cell type not having achieved remission: Secondary | ICD-10-CM | POA: Insufficient documentation

## 2018-07-16 DIAGNOSIS — E441 Mild protein-calorie malnutrition: Secondary | ICD-10-CM | POA: Diagnosis not present

## 2018-07-16 DIAGNOSIS — M7989 Other specified soft tissue disorders: Secondary | ICD-10-CM

## 2018-07-16 DIAGNOSIS — E46 Unspecified protein-calorie malnutrition: Secondary | ICD-10-CM

## 2018-07-16 LAB — LACTATE DEHYDROGENASE: LDH: 130 U/L (ref 98–192)

## 2018-07-16 LAB — CMP (CANCER CENTER ONLY)
ALT: 12 U/L (ref 0–44)
AST: 9 U/L — ABNORMAL LOW (ref 15–41)
Albumin: 3.6 g/dL (ref 3.5–5.0)
Alkaline Phosphatase: 63 U/L (ref 38–126)
Anion gap: 5 (ref 5–15)
BUN: 9 mg/dL (ref 6–20)
CO2: 32 mmol/L (ref 22–32)
Calcium: 8.8 mg/dL — ABNORMAL LOW (ref 8.9–10.3)
Chloride: 107 mmol/L (ref 98–111)
Creatinine: 0.75 mg/dL (ref 0.61–1.24)
GFR, Est AFR Am: >60 mL/min (ref >60)
GFR, Estimated: >60 mL/min (ref >60)
Glucose, Bld: 118 mg/dL — ABNORMAL HIGH (ref 70–99)
Potassium: 3.9 mmol/L (ref 3.5–5.1)
Sodium: 144 mmol/L (ref 135–145)
Total Bilirubin: 0.3 mg/dL (ref 0.3–1.2)
Total Protein: 5.2 g/dL — ABNORMAL LOW (ref 6.5–8.1)

## 2018-07-16 LAB — CBC WITH DIFFERENTIAL (CANCER CENTER ONLY)
Abs Immature Granulocytes: 0.11 10*3/uL — ABNORMAL HIGH (ref 0.00–0.07)
Basophils Absolute: 0.2 10*3/uL — ABNORMAL HIGH (ref 0.0–0.1)
Basophils Relative: 0 %
Eosinophils Absolute: 0.6 10*3/uL — ABNORMAL HIGH (ref 0.0–0.5)
Eosinophils Relative: 1 %
HCT: 34.5 % — ABNORMAL LOW (ref 39.0–52.0)
Hemoglobin: 9.6 g/dL — ABNORMAL LOW (ref 13.0–17.0)
Immature Granulocytes: 0 %
Lymphocytes Relative: 94 %
Lymphs Abs: 90.5 10*3/uL — ABNORMAL HIGH (ref 0.7–4.0)
MCH: 23.8 pg — ABNORMAL LOW (ref 26.0–34.0)
MCHC: 27.8 g/dL — ABNORMAL LOW (ref 30.0–36.0)
MCV: 85.4 fL (ref 80.0–100.0)
Monocytes Absolute: 1 10*3/uL (ref 0.1–1.0)
Monocytes Relative: 1 %
Neutro Abs: 3.4 10*3/uL (ref 1.7–7.7)
Neutrophils Relative %: 4 %
Platelet Count: 334 10*3/uL (ref 150–400)
RBC: 4.04 MIL/uL — ABNORMAL LOW (ref 4.22–5.81)
RDW: 19.3 % — ABNORMAL HIGH (ref 11.5–15.5)
WBC Count: 95.9 10*3/uL (ref 4.0–10.5)
nRBC: 0 % (ref 0.0–0.2)

## 2018-07-16 LAB — URIC ACID: Uric Acid, Serum: 3.4 mg/dL — ABNORMAL LOW (ref 3.7–8.6)

## 2018-07-16 MED ORDER — HYDROXYZINE HCL 10 MG PO TABS: 10.0000 mg | ORAL_TABLET | Freq: Three times a day (TID) | ORAL | 2 refills | Status: AC | PRN

## 2018-07-16 MED ORDER — IBRUTINIB 140 MG PO CAPS: ORAL_CAPSULE | ORAL | 3 refills | Status: DC

## 2018-07-16 NOTE — Progress Notes (Signed)
[facility] OFFICE PROGRESS NOTE  Patient Care Team: [name], MD as PCP - General (Family Medicine) Cordelia Poche, RN as Oncology Nurse Navigator [name], MD as Medical Oncologist (Hematology)  HEME/ONC OVERVIEW: 1. 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 Normocytic anemia  TREATMENT REGIMEN:  05/14/2018 - present: ibrutinib 442m daily  ASSESSMENT & PLAN:   CLL with diffuse lymphadenopathy; RAI Stage IV, intermediate risk  -Ibrutinib started in 012/7517 treatment complicated by fluid retention, diarrhea and drug-related rash  -Overall, he is tolerating ibrutinib reasonably well -Continue ibrutinib 4275mdaily for now -I have ordered CT neck, chest and AP in approximately 4 weeks to assess interim response of the diffuse lymphadenopathy  -Prophylaxis: acyclovir and atovaquone   Normocytic anemia -Secondary to CLL and ibrutinib -Hgb 9.6, stable -Patient denies any symptom of bleeding -We will monitor for now; no indication for dose adjustment -If anemia worsens in the future, we will consider delaying chemotherapy or adjusting chemotherapy dose  Recurrent pruritis -Patient reports recurrent pruritis in the bilateral lower extremities over the past week; no associated rash; improves with PRN hydroxyzine -I have refilled the medication today -If he develops worsening pruritus or recurrent rash, he is instructed to contact the clinic for further evaluation, and may require a short course of  steroid  Fluid retention syndrome -Secondary to ibrutinib -Did not respond to diuretics, improved with dexamethasone -Fluid retention in the extremities initially resolved with dexamethasone, but on exam today, he has mild swelling in the hands -Patient is instructed to call the clinic if he develops worsening swelling  Mild protein malnutrition -Patient lost ~20 lbs since early 05/2018 but over the past week, his appetite has improved and he has gained ~7 lbs -I encouraged the patient to continue nutritional supplements to maintain adequate nutrition  Orders Placed This Encounter  Procedures  . CT SOFT TISSUE NECK W CONTRAST    Standing Status:   Future    Standing Expiration Date:   07/16/2019    Order Specific Question:   If indicated for the ordered procedure, I authorize the administration of contrast media per Radiology protocol    Answer:   Yes    Order Specific Question:   Preferred imaging location?    Answer:   [facility] Question:   Radiology Contrast Protocol - do NOT remove file path    Answer:   \\[email or web address]  . CT CHEST W CONTRAST    Standing Status:   Future    Standing Expiration Date:   07/16/2019    Order Specific Question:   If indicated for the ordered procedure, I authorize the administration of contrast media per Radiology protocol    Answer:   Yes    Order Specific Question:   Preferred imaging location?    Answer:   [facility] Question:   Radiology Contrast Protocol - do NOT remove file path    Answer:   \\[email or web address]  . CT ABDOMEN PELVIS W CONTRAST    Standing Status:   Future  Standing Expiration Date:   07/16/2019    Order Specific Question:   If indicated for the ordered procedure, I authorize the administration of contrast media per Radiology protocol    Answer:   Yes    Order Specific Question:   Preferred imaging location?    Answer:    [facility] Question:   Is Oral Contrast requested for this exam?    Answer:   Yes, Per Radiology protocol    Order Specific Question:   Radiology Contrast Protocol - do NOT remove file path    Answer:   \\[email or web address]  . CBC with Differential ([place] Only)    Standing Status:   Future    Standing Expiration Date:   08/20/2019  . CMP ([place] only)    Standing Status:   Future    Standing Expiration Date:   08/20/2019  . Save Smear ([facility])    Standing Status:   Future    Standing Expiration Date:   07/16/2019  . Lactate dehydrogenase    Standing Status:   Future    Standing Expiration Date:   08/20/2019  . Uric acid    Standing Status:   Future    Standing Expiration Date:   08/20/2019    All questions were answered. The patient knows to call the clinic with any problems, questions or concerns. No barriers to learning was detected.  Return in 4 weeks for labs, imaging results and clinic follow-up.   [name], MD 07/16/2018 3:35 PM  CHIEF COMPLAINT: "I am doing better"  INTERVAL HISTORY: [facility] returns to clinic for follow-up of CLL on ibrutinib.  Patient reports that over the past few weeks, his appetite and taste have improved, and he is able to gain approximately 6 to 7 pounds back.  However, he has developed recurrent pruritus in the bilateral lower extremities, primarily from the knees to the feet as well as in the bilateral forearms, but he denies any associated rash.  He has been taking hydroxyzine approximately twice a day with improvement in the symptom.  He also has mild swelling in the bilateral hands, but is overall unchanged over the past few weeks.  He recently saw his ophthalmologist, who told him that he had a few "floaters" in his right eye, which his ophthalmologist attributed to CLL and/or ibrutinib, but did not recommend any intervention.  He denies any subjective vision change, such as blurry  vision or double vision.  He denies any other complaint today.  SUMMARY OF ONCOLOGIC HISTORY:   CLL (chronic lymphocytic leukemia) ([place])   04/01/2018 Initial Diagnosis    CLL (chronic lymphocytic leukemia) ([place])    04/05/2018 Pathology Results    Peripheral Blood Flow Cytometry - MONOCLONAL B-CELL POPULATION IDENTIFIED. - SEE NOTE.    04/05/2018 Pathology Results    PB FISH: No evidence of CCND1-IGH [t(11;14)] gene rearrangement. No evidence of trisomy 11 or gain of 11q. (excludes mantle cell lymphoma) No evidence of trisomy 12.  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 No evidence of p53 (17p13( deletion or amplification. No evidence of ATM (11q22.3) deletion.     04/07/2018 Imaging    CT neck w/ contrast: IMPRESSION: Bulky bilateral cervical lymphadenopathy consistent with lymphoproliferative disease/lymphoma.    04/07/2018 Imaging    CT CAP w/ contrast: IMPRESSION: 1. Bulky lymphadenopathy throughout the chest, abdomen and pelvis, as detailed above, highly suspicious for lymphoma. 2. Marked splenomegaly. 3. 9 mm pulmonary nodule within the  anterior inferior portion of the RIGHT upper lobe. Subtle low-density nodule within the LEFT lower lobe. These are too small to definitively characterize. Neoplastic pulmonary nodules cannot be excluded. Consider PET-CT for further characterization.    04/16/2018 Surgery    Right axillary LN excisional biopsy    04/16/2018 Pathology Results    (Accession: [id number]) Lymph node for lymphoma, Right Axillary - CHRONIC LYMPHOCYTIC LEUKEMIA/SMALL LYMPHOCYTIC LYMPHOMA.     REVIEW OF SYSTEMS:   Constitutional: ( - ) fevers, ( - )  chills , ( - ) night sweats Eyes: ( - ) blurriness of vision, ( - ) double vision, ( - ) watery eyes Ears, nose, mouth, throat, and face: ( - ) mucositis, ( - ) sore throat Respiratory: ( - ) cough, ( - ) dyspnea, ( - ) wheezes Cardiovascular: ( - ) palpitation, ( - ) chest discomfort, ( -  ) lower extremity swelling Gastrointestinal:  ( - ) nausea, ( - ) heartburn, ( - ) change in bowel habits Skin: ( - ) abnormal skin rashes,  ( + ) pruritus  Lymphatics: ( - ) new lymphadenopathy, ( - ) easy bruising Neurological: ( - ) numbness, ( - ) tingling, ( - ) new weaknesses Behavioral/Psych: ( - ) mood change, ( - ) new changes  All other systems were reviewed with the patient and are negative.  I have reviewed the past medical history, past surgical history, social history and family history with the patient and they are unchanged from previous note.  ALLERGIES:  is allergic to bactrim [sulfamethoxazole-trimethoprim] and amoxicillin.  MEDICATIONS:  Current Outpatient Medications  Medication Sig Dispense Refill  . acyclovir (ZOVIRAX) 400 MG tablet Take 1 tablet (400 mg total) by mouth 2 (two) times daily. 180 tablet 3  . dexamethasone (DECADRON) 4 MG tablet Take 0.5 tablets (2 mg total) by mouth See admin instructions. 28m daily x 5 days, then 256mevery other day x 5 days, then stop 24 tablet 0  . HYDROcodone-acetaminophen (NORCO) 5-325 MG tablet Take 1-2 tablets by mouth every 6 (six) hours as needed for moderate pain or severe pain. 20 tablet 0  . hydrOXYzine (ATARAX/VISTARIL) 10 MG tablet Take 1 tablet (10 mg total) by mouth 3 (three) times daily as needed for up to 14 days for itching. 30 tablet 2  . [START ON 07/22/2018] ibrutinib (IMBRUVICA) 140 MG capsul TAKE 3 CAPSULES (420 MG TOTAL) BY MOUTH DAILY. 90 capsule 3   No current facility-administered medications for this visit.     PHYSICAL EXAMINATION: ECOG PERFORMANCE STATUS: 0 - Asymptomatic  Today's Vitals   07/16/18 1400 07/16/18 1450  Resp: 18   TempSrc: Oral   SpO2: 100%   Weight: 194 lb (88 kg)   Height: '5\' 10"'  (1.778 m)   PainSc:  0-No pain   Body mass index is 27.84 kg/m.  Filed Weights   07/16/18 1400  Weight: 194 lb (88 kg)    GENERAL: alert, no distress and comfortable SKIN: skin color, texture,  turgor are normal, no rashes or significant lesions EYES: conjunctiva are pink and non-injected, sclera clear OROPHARYNX: no exudate, no erythema; lips, buccal mucosa, and tongue normal  NECK: supple, non-tender LYMPH:  subcentimeter cervical lymphadenopathy LUNGS: clear to auscultation with normal breathing effort HEART: regular rate & rhythm and no murmurs and no lower extremity edema ABDOMEN: soft, non-tender, non-distended, normal bowel sounds Musculoskeletal: no cyanosis of digits and no clubbing  PSYCH: alert & oriented x 3, fluent speech NEURO: no focal  motor/sensory deficits  LABORATORY DATA:  I have reviewed the data as listed    Component Value Date/Time   NA 144 07/16/2018 1420   NA 142 03/31/2018 1649   K 3.9 07/16/2018 1420   CL 107 07/16/2018 1420   CO2 32 07/16/2018 1420   GLUCOSE 118 (H) 07/16/2018 1420   BUN 9 07/16/2018 1420   BUN 16 03/31/2018 1649   CREATININE 0.75 07/16/2018 1420   CALCIUM 8.8 (L) 07/16/2018 1420   PROT 5.2 (L) 07/16/2018 1420   PROT 5.9 (L) 03/31/2018 1649   ALBUMIN 3.6 07/16/2018 1420   ALBUMIN 4.6 03/31/2018 1649   AST 9 (L) 07/16/2018 1420   ALT 12 07/16/2018 1420   ALKPHOS 63 07/16/2018 1420   BILITOT 0.3 07/16/2018 1420   GFRNONAA >60 07/16/2018 1420   GFRAA >60 07/16/2018 1420    No results found for: SPEP, UPEP  Lab Results  Component Value Date   WBC 95.9 (HH) 07/16/2018   NEUTROABS 3.4 07/16/2018   HGB 9.6 (L) 07/16/2018   HCT 34.5 (L) 07/16/2018   MCV 85.4 07/16/2018   PLT 334 07/16/2018      Chemistry      Component Value Date/Time   NA 144 07/16/2018 1420   NA 142 03/31/2018 1649   K 3.9 07/16/2018 1420   CL 107 07/16/2018 1420   CO2 32 07/16/2018 1420   BUN 9 07/16/2018 1420   BUN 16 03/31/2018 1649   CREATININE 0.75 07/16/2018 1420      Component Value Date/Time   CALCIUM 8.8 (L) 07/16/2018 1420   ALKPHOS 63 07/16/2018 1420   AST 9 (L) 07/16/2018 1420   ALT 12 07/16/2018 1420   BILITOT 0.3  07/16/2018 1420

## 2018-07-16 NOTE — Telephone Encounter (Signed)
Appointments scheduled avs/calendar declined due to my chart per 3/6 los

## 2018-07-29 ENCOUNTER — Other Ambulatory Visit: Payer: Self-pay | Admitting: Hematology

## 2018-07-29 DIAGNOSIS — C9110 Chronic lymphocytic leukemia of B-cell type not having achieved remission: Secondary | ICD-10-CM

## 2018-08-03 MED FILL — IMBRUVICA 140 MG CAPSULE: 140 | 30 days supply | Qty: 90 | Fill #0

## 2018-08-11 ENCOUNTER — Ambulatory Visit (HOSPITAL_BASED_OUTPATIENT_CLINIC_OR_DEPARTMENT_OTHER)
Admission: RE | Admit: 2018-08-11 | Discharge: 2018-08-11 | Disposition: A | Discharge status: Home / Self Care | Payer: BLUE CROSS/BLUE SHIELD | Source: Ambulatory Visit | Attending: Hematology | Admitting: Hematology

## 2018-08-11 ENCOUNTER — Encounter (HOSPITAL_BASED_OUTPATIENT_CLINIC_OR_DEPARTMENT_OTHER): Payer: Self-pay

## 2018-08-11 ENCOUNTER — Other Ambulatory Visit: Payer: Self-pay

## 2018-08-11 ENCOUNTER — Other Ambulatory Visit (HOSPITAL_BASED_OUTPATIENT_CLINIC_OR_DEPARTMENT_OTHER): Payer: BLUE CROSS/BLUE SHIELD

## 2018-08-11 DIAGNOSIS — C9110 Chronic lymphocytic leukemia of B-cell type not having achieved remission: Secondary | ICD-10-CM

## 2018-08-11 HISTORY — DX: Malignant (primary) neoplasm, unspecified: C80.1

## 2018-08-11 MED ORDER — IOHEXOL 300 MG/ML  SOLN
100.0000 mL | Freq: Once | INTRAMUSCULAR | Status: AC | PRN
  Administered 2018-08-11: 100 mL via INTRAVENOUS

## 2018-08-12 ENCOUNTER — Encounter: Payer: Self-pay | Admitting: *Deleted

## 2018-08-12 ENCOUNTER — Other Ambulatory Visit: Payer: Self-pay | Admitting: Hematology

## 2018-08-12 DIAGNOSIS — R942 Abnormal results of pulmonary function studies: Secondary | ICD-10-CM

## 2018-08-12 NOTE — Progress Notes (Unsigned)
god

## 2018-08-13 ENCOUNTER — Other Ambulatory Visit: Payer: BLUE CROSS/BLUE SHIELD

## 2018-08-13 ENCOUNTER — Telehealth: Payer: Self-pay | Admitting: *Deleted

## 2018-08-13 ENCOUNTER — Ambulatory Visit: Payer: BLUE CROSS/BLUE SHIELD | Admitting: Hematology

## 2018-08-13 NOTE — Telephone Encounter (Signed)
Pt missed appt today, call home, cell and other # to reach pt. Unable to reach pt or leave message

## 2018-08-19 ENCOUNTER — Other Ambulatory Visit: Payer: Self-pay | Admitting: Hematology

## 2018-08-19 ENCOUNTER — Encounter: Payer: Self-pay | Admitting: Hematology

## 2018-08-19 ENCOUNTER — Telehealth: Payer: Self-pay | Admitting: Hematology

## 2018-08-19 ENCOUNTER — Other Ambulatory Visit: Payer: Self-pay

## 2018-08-19 ENCOUNTER — Inpatient Hospital Stay: Payer: BLUE CROSS/BLUE SHIELD | Attending: Hematology

## 2018-08-19 ENCOUNTER — Inpatient Hospital Stay (HOSPITAL_BASED_OUTPATIENT_CLINIC_OR_DEPARTMENT_OTHER): Payer: BLUE CROSS/BLUE SHIELD | Admitting: Hematology

## 2018-08-19 ENCOUNTER — Telehealth: Payer: Self-pay | Admitting: *Deleted

## 2018-08-19 VITALS — BP 139/84 | HR 79 | Temp 98.8°F | Resp 18 | Wt 205.0 lb

## 2018-08-19 DIAGNOSIS — R609 Edema, unspecified: Secondary | ICD-10-CM

## 2018-08-19 DIAGNOSIS — Z79899 Other long term (current) drug therapy: Secondary | ICD-10-CM

## 2018-08-19 DIAGNOSIS — R161 Splenomegaly, not elsewhere classified: Secondary | ICD-10-CM | POA: Insufficient documentation

## 2018-08-19 DIAGNOSIS — C9110 Chronic lymphocytic leukemia of B-cell type not having achieved remission: Secondary | ICD-10-CM

## 2018-08-19 DIAGNOSIS — R942 Abnormal results of pulmonary function studies: Secondary | ICD-10-CM

## 2018-08-19 DIAGNOSIS — R918 Other nonspecific abnormal finding of lung field: Secondary | ICD-10-CM

## 2018-08-19 DIAGNOSIS — R197 Diarrhea, unspecified: Secondary | ICD-10-CM

## 2018-08-19 DIAGNOSIS — D649 Anemia, unspecified: Secondary | ICD-10-CM | POA: Insufficient documentation

## 2018-08-19 DIAGNOSIS — L298 Other pruritus: Secondary | ICD-10-CM

## 2018-08-19 DIAGNOSIS — L270 Generalized skin eruption due to drugs and medicaments taken internally: Secondary | ICD-10-CM

## 2018-08-19 DIAGNOSIS — M7989 Other specified soft tissue disorders: Secondary | ICD-10-CM

## 2018-08-19 LAB — CMP (CANCER CENTER ONLY)
ALT: 11 U/L (ref 0–44)
AST: 10 U/L — ABNORMAL LOW (ref 15–41)
Albumin: 4.3 g/dL (ref 3.5–5.0)
Alkaline Phosphatase: 51 U/L (ref 38–126)
Anion gap: 6 (ref 5–15)
BUN: 14 mg/dL (ref 6–20)
CO2: 29 mmol/L (ref 22–32)
Calcium: 8.8 mg/dL — ABNORMAL LOW (ref 8.9–10.3)
Chloride: 105 mmol/L (ref 98–111)
Creatinine: 0.93 mg/dL (ref 0.61–1.24)
GFR, Est AFR Am: >60 mL/min (ref >60)
GFR, Estimated: >60 mL/min (ref >60)
Glucose, Bld: 103 mg/dL — ABNORMAL HIGH (ref 70–99)
Potassium: 4 mmol/L (ref 3.5–5.1)
Sodium: 140 mmol/L (ref 135–145)
Total Bilirubin: 0.5 mg/dL (ref 0.3–1.2)
Total Protein: 5.7 g/dL — ABNORMAL LOW (ref 6.5–8.1)

## 2018-08-19 LAB — CBC WITH DIFFERENTIAL (CANCER CENTER ONLY)
Abs Immature Granulocytes: 0.11 10*3/uL — ABNORMAL HIGH (ref 0.00–0.07)
Basophils Absolute: 0.1 10*3/uL (ref 0.0–0.1)
Basophils Relative: 0 %
Eosinophils Absolute: 0.3 10*3/uL (ref 0.0–0.5)
Eosinophils Relative: 0 %
HCT: 39.7 % (ref 39.0–52.0)
Hemoglobin: 11.8 g/dL — ABNORMAL LOW (ref 13.0–17.0)
Immature Granulocytes: 0 %
Lymphocytes Relative: 93 %
Lymphs Abs: 62.9 10*3/uL — ABNORMAL HIGH (ref 0.7–4.0)
MCH: 23.9 pg — ABNORMAL LOW (ref 26.0–34.0)
MCHC: 29.7 g/dL — ABNORMAL LOW (ref 30.0–36.0)
MCV: 80.5 fL (ref 80.0–100.0)
Monocytes Absolute: 0.5 10*3/uL (ref 0.1–1.0)
Monocytes Relative: 1 %
Neutro Abs: 3.8 10*3/uL (ref 1.7–7.7)
Neutrophils Relative %: 6 %
Platelet Count: 210 10*3/uL (ref 150–400)
RBC: 4.93 MIL/uL (ref 4.22–5.81)
RDW: 17.2 % — ABNORMAL HIGH (ref 11.5–15.5)
WBC Count: 67.6 10*3/uL (ref 4.0–10.5)
nRBC: 0 % (ref 0.0–0.2)

## 2018-08-19 LAB — URIC ACID: Uric Acid, Serum: 4.9 mg/dL (ref 3.7–8.6)

## 2018-08-19 LAB — LACTATE DEHYDROGENASE: LDH: 160 U/L (ref 98–192)

## 2018-08-19 LAB — SAVE SMEAR (SSMR)

## 2018-08-19 MED ORDER — FLUTICASONE PROPIONATE 0.05 % EX CREA: TOPICAL_CREAM | Freq: Two times a day (BID) | CUTANEOUS | 0 refills | Status: DC

## 2018-08-19 NOTE — Telephone Encounter (Signed)
Appointments scheduled letter/calendar mailed per 4/9 los

## 2018-08-19 NOTE — Telephone Encounter (Signed)
Dr. Maylon Peppers notified of WBC of 67.6.

## 2018-08-19 NOTE — Progress Notes (Signed)
[facility] OFFICE PROGRESS NOTE  Patient Care Team: [name], MD as PCP - General (Family Medicine) Cordelia Poche, RN as Oncology Nurse Navigator [name], MD as Medical Oncologist (Hematology)  HEME/ONC OVERVIEW: 1. CLL with bulky lymphadenopathy; RAI Stage IV, intermediate risk -03/2018:   PB flow showed CD5+, CD23+ monoclonal B-cells, c/w CLL; IgHV mutation negative  FISH (PB) positive for WJX([id number]); negative t(11;14), p53 deletion/amplification, and ATM (11q22.3) deletion  CT neck, CAP showed bulky lymphadenopathy in bilateral cervical LN's and throughout the chest, abdomen and pelvis; marked splenomegaly -04/2018: R axillary excision LN biopsy showed CLL/SLL (cyclin D1 neg) -05/2017 - present: ibrutinib  -08/2018: interim CT CAP showed significant improvement in nearly all LN's and splenomegaly except a few LN's in the thorax and abnormal-appearing left lung nodule, possibly due to infection  TREATMENT REGIMEN:  05/14/2018 - present: ibrutinib 455m daily  ASSESSMENT & PLAN:   CLL with diffuse lymphadenopathy; RAI Stage IV, intermediate risk  -Ibrutinib started in 029/5621 treatment complicated by fluid retention, rash and diarrhea -Overall, the side effects are subsiding and he is overall tolerating the treatment relatively well -I independently reviewed the radiologic images of recent CT neck, chest, abdomen and pelvis, and agree with findings as documented; in summary, CT showed significant improvement in nearly all LN's and spleen size except a few LN's in the thorax and an abnormal appearing left lung nodule, possibly due to infection -Given overall improvement in the lymphadenopathy, we will continue ibrutinib 425mdaily  -Ppx: acyclovir, atovaquone   Abnormal left lung nodule  -Review of the CT chest images showed a left lung nodule with "atoll sign", i.e. central groundglass opacity surrounded by denser consolidation, which can be seen in  opportunistic infections, and pulmonary infarction due to VTE -Clinically, patient denies any fever, night sweats, chest pain, dyspnea, cough, sputum production, hemoptysis -As infection is on the differential diagnosis, and ibrutinib can be associated with atypical/fungal infection, I will refer the patient to infectious disease for further evaluation -Meanwhile, I have ordered histo urine antigen and blasto serum antigen to rule out fungal infections as well as TB quantiferon test   Normocytic anemia -Secondary to CLL and ibrutinib -Hgb 11.8, improving -Patient denies any symptoms of bleeding -We will monitor for now; no indication for dose adjustment  Ibrutinib-associated rash -Pruritis responds to PRN hydroxyzine -Exam notable for patchy erythema on the posterior thigh without any skin breakdown or drainage -I have prescribed topical fluticasone cream BID PRN -If rash worsens, we can consider systemic steroid   Fluid retention syndrome -Secondary to ibrutinib -Improvement with dexamethasone, no response to diuretics -Exam today did not show any significant fluid retention  -We will monitor closely for any recurrent symptoms   Orders Placed This Encounter  Procedures  . CBC with Differential (Cancer Center Only)    Standing Status:   Future    Standing Expiration Date:   09/23/2019  . CMP ([facility])    Standing Status:   Future    Standing Expiration Date:   09/23/2019  . Save Smear ([facility])    Standing Status:   Future    Standing Expiration Date:   08/19/2019  . Lactate dehydrogenase    Standing Status:   Future    Standing Expiration Date:   09/23/2019   All questions were answered. The patient knows to call the clinic with any problems, questions or concerns. No barriers to learning was detected.  Return in 1 month for labs and  clinic follow-up.  [name], MD 08/19/2018 2:11 PM  CHIEF COMPLAINT: "I am doing fine"  INTERVAL HISTORY: Mr. Evilsizer returns to  clinic for follow-up of CLL on ibrutinib.  Patient reports that he has periodic, generalized pruritus without any associated rash, for which he takes PRN hydroxyzine with relief of the symptom.  He denies any recurrent fever, chill, night sweats, lymphadenopathy or fluid retention.  Over the past few days, he has developed a small area of patchy redness on the posterior left thigh, associated with itching.  He denies any other areas of rash.  He otherwise reports tolerating ibrutinib well without significant new side effects.  SUMMARY OF ONCOLOGIC HISTORY:   CLL (chronic lymphocytic leukemia) ([place])   04/01/2018 Initial Diagnosis    CLL (chronic lymphocytic leukemia) ([place])    04/05/2018 Pathology Results    Peripheral Blood Flow Cytometry - MONOCLONAL B-CELL POPULATION IDENTIFIED. - SEE NOTE.    04/05/2018 Pathology Results    PB FISH: No evidence of CCND1-IGH [t(11;14)] gene rearrangement. No evidence of trisomy 11 or gain of 11q. (excludes mantle cell lymphoma) No evidence of trisomy 12.  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 No evidence of p53 (17p13( deletion or amplification. No evidence of ATM (11q22.3) deletion.     04/07/2018 Imaging    CT neck w/ contrast: IMPRESSION: Bulky bilateral cervical lymphadenopathy consistent with lymphoproliferative disease/lymphoma.    04/07/2018 Imaging    CT CAP w/ contrast: IMPRESSION: 1. Bulky lymphadenopathy throughout the chest, abdomen and pelvis, as detailed above, highly suspicious for lymphoma. 2. Marked splenomegaly. 3. 9 mm pulmonary nodule within the anterior inferior portion of the RIGHT upper lobe. Subtle low-density nodule within the LEFT lower lobe. These are too small to definitively characterize. Neoplastic pulmonary nodules cannot be excluded. Consider PET-CT for further characterization.    04/16/2018 Surgery    Right axillary LN excisional biopsy    04/16/2018 Pathology Results    (Accession:  [id number]) Lymph node for lymphoma, Right Axillary - CHRONIC LYMPHOCYTIC LEUKEMIA/SMALL LYMPHOCYTIC LYMPHOMA.     REVIEW OF SYSTEMS:   Constitutional: ( - ) fevers, ( - )  chills , ( - ) night sweats Eyes: ( - ) blurriness of vision, ( - ) double vision, ( - ) watery eyes Ears, nose, mouth, throat, and face: ( - ) mucositis, ( - ) sore throat Respiratory: ( - ) cough, ( - ) dyspnea, ( - ) wheezes Cardiovascular: ( - ) palpitation, ( - ) chest discomfort, ( - ) lower extremity swelling Gastrointestinal:  ( - ) nausea, ( - ) heartburn, ( - ) change in bowel habits Skin: ( + ) abnormal skin rashes Lymphatics: ( - ) new lymphadenopathy, ( - ) easy bruising Neurological: ( - ) numbness, ( - ) tingling, ( - ) new weaknesses Behavioral/Psych: ( - ) mood change, ( - ) new changes  All other systems were reviewed with the patient and are negative.  I have reviewed the past medical history, past surgical history, social history and family history with the patient and they are unchanged from previous note.  ALLERGIES:  is allergic to bactrim [sulfamethoxazole-trimethoprim] and amoxicillin.  MEDICATIONS:  Current Outpatient Medications  Medication Sig Dispense Refill  . acyclovir (ZOVIRAX) 400 MG tablet Take 400 mg by mouth 2 (two) times daily.     Marland Kitchen atovaquone (MEPRON) 750 MG/5ML suspension Take 10 mLs by mouth daily.    . fluticasone (CUTIVATE) 0.05 % cream Apply topically 2 (two) times daily.  30 g 0  . hydrOXYzine (ATARAX/VISTARIL) 10 MG tablet Take 10 mg by mouth 2 (two) times daily as needed.    . ibrutinib (IMBRUVICA) 140 MG capsul TAKE 3 CAPSULES (420 MG TOTAL) BY MOUTH DAILY. 90 capsule 3   No current facility-administered medications for this visit.     PHYSICAL EXAMINATION: ECOG PERFORMANCE STATUS: 1 - Symptomatic but completely ambulatory  Today's Vitals   08/19/18 1333  BP: 139/84  Pulse: 79  Resp: 18  Temp: 98.8 F (37.1 C)  TempSrc: Oral  SpO2: 98%  Weight: 205 lb  (93 kg)  PainSc: 0-No pain   Body mass index is 29.41 kg/m.  Filed Weights   08/19/18 1333  Weight: 205 lb (93 kg)    GENERAL: alert, no distress and comfortable SKIN: a small area (~3-4cm) erythematous patch over the lower posterior thigh without any raised lesion, skin desquamation or purulent drainage, no other rash  EYES: conjunctiva are pink and non-injected, sclera clear OROPHARYNX: no exudate, no erythema; lips, buccal mucosa, and tongue normal  NECK: supple, non-tender LYMPH:  no palpable lymphadenopathy in the cervical LUNGS: clear to auscultation with normal breathing effort HEART: regular rate & rhythm and no murmurs and no lower extremity edema ABDOMEN: soft, non-tender, non-distended, normal bowel sounds Musculoskeletal: no cyanosis of digits and no clubbing  PSYCH: alert & oriented x 3, fluent speech NEURO: no focal motor/sensory deficits  LABORATORY DATA:  I have reviewed the data as listed    Component Value Date/Time   NA 140 08/19/2018 1308   NA 142 03/31/2018 1649   K 4.0 08/19/2018 1308   CL 105 08/19/2018 1308   CO2 29 08/19/2018 1308   GLUCOSE 103 (H) 08/19/2018 1308   BUN 14 08/19/2018 1308   BUN 16 03/31/2018 1649   CREATININE 0.93 08/19/2018 1308   CALCIUM 8.8 (L) 08/19/2018 1308   PROT 5.7 (L) 08/19/2018 1308   PROT 5.9 (L) 03/31/2018 1649   ALBUMIN 4.3 08/19/2018 1308   ALBUMIN 4.6 03/31/2018 1649   AST 10 (L) 08/19/2018 1308   ALT 11 08/19/2018 1308   ALKPHOS 51 08/19/2018 1308   BILITOT 0.5 08/19/2018 1308   GFRNONAA >60 08/19/2018 1308   GFRAA >60 08/19/2018 1308    No results found for: SPEP, UPEP  Lab Results  Component Value Date   WBC 67.6 (HH) 08/19/2018   NEUTROABS 3.8 08/19/2018   HGB 11.8 (L) 08/19/2018   HCT 39.7 08/19/2018   MCV 80.5 08/19/2018   PLT 210 08/19/2018      Chemistry      Component Value Date/Time   NA 140 08/19/2018 1308   NA 142 03/31/2018 1649   K 4.0 08/19/2018 1308   CL 105 08/19/2018 1308    CO2 29 08/19/2018 1308   BUN 14 08/19/2018 1308   BUN 16 03/31/2018 1649   CREATININE 0.93 08/19/2018 1308      Component Value Date/Time   CALCIUM 8.8 (L) 08/19/2018 1308   ALKPHOS 51 08/19/2018 1308   AST 10 (L) 08/19/2018 1308   ALT 11 08/19/2018 1308   BILITOT 0.5 08/19/2018 1308       RADIOGRAPHIC STUDIES: I have personally reviewed the radiological images as listed below and agreed with the findings in the report. Ct Soft Tissue Neck W Contrast  Result Date: 08/11/2018 CLINICAL DATA:  Follow-up examination for leukemia. EXAM: CT NECK WITH CONTRAST TECHNIQUE: Multidetector CT imaging of the neck was performed using the standard protocol following the bolus administration of  intravenous contrast. CONTRAST:  168m OMNIPAQUE IOHEXOL 300 MG/ML  SOLN COMPARISON:  Prior CT from 04/07/2018. FINDINGS: Pharynx and larynx: Oral cavity within normal limits without discrete mass or loculated fluid collection. No acute abnormality about the dentition. Palatine tonsils symmetric and within normal limits. Parapharyngeal fat maintained. Nasopharynx normal. No retropharyngeal collection. Epiglottis normal. Vallecula clear. Remainder of the hypopharynx and supraglottic larynx within normal limits. Glottis normal. Subglottic airway clear. Salivary glands: Salivary glands including the parotid and submandibular glands are normal. Thyroid: Normal. Lymph nodes: There has been market interval improvement in widespread bulky adenopathy previously seen throughout the neck. Right level II a reference node measures 1.4 x 0.8 cm, previously 3.0 x 2.5 cm (series 8, image 51). Reference left level II B node/nodal conglomerate now measures 1.2 x 0.8 cm, previously 2.2 x 2.8 cm (series 8, image 46). Reference right supraclavicular node measures 2.2 x 1.2 cm, previously 3.0 x 2.5 cm (series 8, image 88). Reference left supraclavicular node now measures 0.5 x 0.7 cm, previously 3.1 x 2.2 cm (series 8, image 91). Vascular:  Normal intravascular enhancement seen throughout the neck. Limited intracranial: Unremarkable. Visualized orbits: Globes and orbital soft tissues within normal limits. Mastoids and visualized paranasal sinuses: Mild mucosal thickening within the right maxillary sinus. Visualized paranasal sinuses are otherwise clear. Mastoids and middle ear cavities are well pneumatized and free of fluid. Skeleton: No acute osseous finding. No discrete lytic or blastic osseous lesions. Mild-to-moderate multilevel cervical spondylolysis noted. Upper chest: Reported separately. Other: None. IMPRESSION: Marked interval improvement in bulky bilateral cervical adenopathy as detailed above, compatible with interval response to therapy. Electronically Signed   By: BJeannine BogaM.D.   On: 08/11/2018 15:06   Ct Chest W Contrast  Result Date: 08/11/2018 CLINICAL DATA:  48year old male with a history of CLL EXAM: CT CHEST, ABDOMEN, AND PELVIS WITH CONTRAST TECHNIQUE: Multidetector CT imaging of the chest, abdomen and pelvis was performed following the standard protocol during bolus administration of intravenous contrast. CONTRAST:  1053mOMNIPAQUE IOHEXOL 300 MG/ML  SOLN COMPARISON:  None. 04/07/2018 FINDINGS: CT CHEST FINDINGS Cardiovascular: Heart size within normal limits. No pericardial fluid/thickening. Course caliber and contour of the thoracic aorta unremarkable. No atherosclerosis. No aneurysm or dissection flap. Unremarkable appearance of the pulmonary arteries. Mediastinum: Unremarkable course of the thoracic esophagus. Unremarkable thoracic inlet. Nodes: Significant reduction in the size of the axillary lymph nodes, now with the axillary lymph nodes not enlarged. Surgical changes of the right axilla. Interval enlargement of lymph nodes in the right internal mammary chain. Interval enlargement of right hilar nodal mass (2.7 cm, previously 2.6 cm), peribronchial nodal mass (2.5 cm, previously 1.1 cm), and right pericardial  node (1.6 cm, previously 6 mm). Subcarinal nodes are decreased in size. Lungs/Pleura: No pneumothorax. No pleural effusion. No confluent airspace disease. Nodule of right upper lobe on image 70 of series 4, decreased in size from the comparison, previously 9 mm, currently 6 mm. Nodule posterior right upper lobe on image 66 of series 4, unchanged in size. Nodule of the left lower lobe has developed into an atoll sign, with central ground-glass opacity and a crescent of consolidation, best seen on image 88 of series 4. Musculoskeletal: No displaced fracture. Degenerative changes of the thoracic spine. CT ABDOMEN PELVIS FINDINGS Hepatobiliary: Unremarkable liver.  Unremarkable gallbladder. Pancreas: Unremarkable pancreas Spleen: Significant decreased size in spleen, with the greatest axial dimension on current CT measuring 15 cm, previously greater than 20 cm. Adrenals/Urinary Tract: Unremarkable appearance of the adrenal glands.  No evidence of hydronephrosis of the right or left kidney. No nephrolithiasis. Unremarkable course of the bilateral ureters. Unremarkable appearance of the urinary bladder. Stomach/Bowel: Unremarkable stomach. Unremarkable small bowel. Normal appendix. Unremarkable colon. Vascular/Lymphatic: No significant atherosclerotic changes. No retroperitoneal adenopathy, with small lymph nodes present. Previously enlarged lymph nodes along the periaortic/preaortic nodal station of significantly decreased in size. Small lymph nodes within the base of the mesentery, significantly improved when compared to the prior CT. Pelvic lymph nodes significantly improved, including the pelvic sidewall lymph nodes which were previously enlarged. Improved lymph nodes along the bilateral iliac nodal stations. Improved lymph nodes of the bilateral inguinal region. None of these are enlarged on the current CT. Reproductive: Unremarkable appearance of the pelvic organs. Other: None Musculoskeletal: No acute displaced  fracture. Degenerative changes of the thoracolumbar spine without bony canal narrowing. IMPRESSION: CT demonstrates mixed response to therapy. Though nearly all lymph nodes have normalized in the chest/abdomen/pelvis, there has been, however, interval enlargement of nodes in the right internal mammary nodal station, a right pericardial node, and right hilar/peribronchial nodes. Left lung nodule, previously 6 mm, has evolved into an "atoll sign". While this finding is favored to represent treatment effect, a secondary organizing pneumonia, atypical infection (including fungal infection), or metastases remains on the differential. Additional right-sided lung nodules of the right upper lobe are stable to decreased in size. Recommend attention on follow-up imaging. Significant decreased size of the spleen, though splenomegaly persists. Electronically Signed   By: Corrie Mckusick D.O.   On: 08/11/2018 15:29   Ct Abdomen Pelvis W Contrast  Result Date: 08/11/2018 CLINICAL DATA:  48 year old male with a history of CLL EXAM: CT CHEST, ABDOMEN, AND PELVIS WITH CONTRAST TECHNIQUE: Multidetector CT imaging of the chest, abdomen and pelvis was performed following the standard protocol during bolus administration of intravenous contrast. CONTRAST:  145m OMNIPAQUE IOHEXOL 300 MG/ML  SOLN COMPARISON:  None. 04/07/2018 FINDINGS: CT CHEST FINDINGS Cardiovascular: Heart size within normal limits. No pericardial fluid/thickening. Course caliber and contour of the thoracic aorta unremarkable. No atherosclerosis. No aneurysm or dissection flap. Unremarkable appearance of the pulmonary arteries. Mediastinum: Unremarkable course of the thoracic esophagus. Unremarkable thoracic inlet. Nodes: Significant reduction in the size of the axillary lymph nodes, now with the axillary lymph nodes not enlarged. Surgical changes of the right axilla. Interval enlargement of lymph nodes in the right internal mammary chain. Interval enlargement of  right hilar nodal mass (2.7 cm, previously 2.6 cm), peribronchial nodal mass (2.5 cm, previously 1.1 cm), and right pericardial node (1.6 cm, previously 6 mm). Subcarinal nodes are decreased in size. Lungs/Pleura: No pneumothorax. No pleural effusion. No confluent airspace disease. Nodule of right upper lobe on image 70 of series 4, decreased in size from the comparison, previously 9 mm, currently 6 mm. Nodule posterior right upper lobe on image 66 of series 4, unchanged in size. Nodule of the left lower lobe has developed into an atoll sign, with central ground-glass opacity and a crescent of consolidation, best seen on image 88 of series 4. Musculoskeletal: No displaced fracture. Degenerative changes of the thoracic spine. CT ABDOMEN PELVIS FINDINGS Hepatobiliary: Unremarkable liver.  Unremarkable gallbladder. Pancreas: Unremarkable pancreas Spleen: Significant decreased size in spleen, with the greatest axial dimension on current CT measuring 15 cm, previously greater than 20 cm. Adrenals/Urinary Tract: Unremarkable appearance of the adrenal glands. No evidence of hydronephrosis of the right or left kidney. No nephrolithiasis. Unremarkable course of the bilateral ureters. Unremarkable appearance of the urinary bladder. Stomach/Bowel: Unremarkable stomach.  Unremarkable small bowel. Normal appendix. Unremarkable colon. Vascular/Lymphatic: No significant atherosclerotic changes. No retroperitoneal adenopathy, with small lymph nodes present. Previously enlarged lymph nodes along the periaortic/preaortic nodal station of significantly decreased in size. Small lymph nodes within the base of the mesentery, significantly improved when compared to the prior CT. Pelvic lymph nodes significantly improved, including the pelvic sidewall lymph nodes which were previously enlarged. Improved lymph nodes along the bilateral iliac nodal stations. Improved lymph nodes of the bilateral inguinal region. None of these are enlarged on  the current CT. Reproductive: Unremarkable appearance of the pelvic organs. Other: None Musculoskeletal: No acute displaced fracture. Degenerative changes of the thoracolumbar spine without bony canal narrowing. IMPRESSION: CT demonstrates mixed response to therapy. Though nearly all lymph nodes have normalized in the chest/abdomen/pelvis, there has been, however, interval enlargement of nodes in the right internal mammary nodal station, a right pericardial node, and right hilar/peribronchial nodes. Left lung nodule, previously 6 mm, has evolved into an "atoll sign". While this finding is favored to represent treatment effect, a secondary organizing pneumonia, atypical infection (including fungal infection), or metastases remains on the differential. Additional right-sided lung nodules of the right upper lobe are stable to decreased in size. Recommend attention on follow-up imaging. Significant decreased size of the spleen, though splenomegaly persists. Electronically Signed   By: Corrie Mckusick D.O.   On: 08/11/2018 15:29

## 2018-08-21 LAB — QUANTIFERON-TB GOLD PLUS (RQFGPL)
QuantiFERON Mitogen Value: >10 IU/mL
QuantiFERON Nil Value: 0.03 IU/mL
QuantiFERON TB1 Ag Value: 0.02 IU/mL
QuantiFERON TB2 Ag Value: 0.02 IU/mL

## 2018-08-21 LAB — QUANTIFERON-TB GOLD PLUS: QuantiFERON-TB Gold Plus: NEGATIVE

## 2018-08-22 LAB — HISTOPLASMA ANTIGEN, URINE: Histoplasma Antigen, urine: <0.5 (ref <0.5)

## 2018-08-24 ENCOUNTER — Telehealth: Payer: Self-pay | Admitting: Internal Medicine

## 2018-08-24 NOTE — Telephone Encounter (Signed)
COVID-19 Pre-Screening Questions:  Do you currently have a fever (>100 F), chills or unexplained body aches? no  Are you currently experiencing new cough, shortness of breath, sore throat, runny nose? no   Have you recently travelled outside the state of [place] in the last 14 days?No   Have you been in contact with someone that is currently pending confirmation of Covid19 testing or has been confirmed to have the Covid19 virus? no

## 2018-08-25 ENCOUNTER — Other Ambulatory Visit: Payer: Self-pay

## 2018-08-25 ENCOUNTER — Encounter: Payer: Self-pay | Admitting: Internal Medicine

## 2018-08-25 ENCOUNTER — Ambulatory Visit (INDEPENDENT_AMBULATORY_CARE_PROVIDER_SITE_OTHER): Payer: BLUE CROSS/BLUE SHIELD | Admitting: Internal Medicine

## 2018-08-25 DIAGNOSIS — R911 Solitary pulmonary nodule: Secondary | ICD-10-CM | POA: Diagnosis not present

## 2018-08-25 LAB — BLASTOMYCES ANTIGEN: Blastomyces Antigen: NOT DETECTED ng/mL

## 2018-08-25 NOTE — Progress Notes (Signed)
[facility]      Reason for Consult:pulomonary nodule    Referring Physician: Dr. Maylon Peppers    Patient ID: David Whitehead, male    DOB: 27-Nov-1970, 48 y.o.   MRN: [id number]  HPI:   He is here for evaluation of a pulmonary nodule.  He was first noted to have 2 pulmonary nodules on a CAT scan in November 2019 and in January started on his therapy for CLL with ibrutinib.  He did at the time have an acute illness that seem like an upper respiratory infection which did improve.  He is continued on the medication however more recently had a repeat of his CAT scan and did note somewhat evolution of the nodule which is still 6 mm but with an Atoll sign, that may be evolution of infection versus medication effect.  He though has had no symptoms of shortness of breath, fever, weight loss associated with this (though he did have some weight loss with his recent illness but this has mainly returned).  He is not having any cough or sputum production.  He otherwise has no particular complaints.  Dr. Maylon Peppers did send off for blastomycosis and histoplasmosis. CT scan independently reviewed and nodule noted.  Previous record reviewed in Epic including recent labs.   Past Medical History:  Diagnosis Date  . Cancer ([place])   . Leukocytosis 04/01/2018  . Sleep apnea    uses a cpap    Prior to Admission medications   Medication Sig Start Date End Date Taking? Authorizing Provider  acyclovir (ZOVIRAX) 400 MG tablet Take 400 mg by mouth 2 (two) times daily.    Yes [provider]  atovaquone (MEPRON) 750 MG/5ML suspension Take 10 mLs by mouth daily. 07/26/18  Yes [provider]  fluticasone (CUTIVATE) 0.05 % cream Apply topically 2 (two) times daily. 08/19/18  Yes [name], MD  hydrOXYzine (ATARAX/VISTARIL) 10 MG tablet Take 10 mg by mouth 2 (two) times daily as needed. 08/04/18  Yes [provider]  ibrutinib (IMBRUVICA) 140 MG capsul TAKE 3 CAPSULES (420 MG  TOTAL) BY MOUTH DAILY. 07/22/18  Yes [name], MD    Allergies  Allergen Reactions  . Bactrim [Sulfamethoxazole-Trimethoprim] Rash  . Amoxicillin Rash    Has patient had a PCN reaction causing immediate rash, facial/tongue/throat swelling, SOB or lightheadedness with hypotension: No Has patient had a PCN reaction causing severe rash involving mucus membranes or skin necrosis: No Has patient had a PCN reaction that required hospitalization: No Has patient had a PCN reaction occurring within the last 10 years: No If all of the above answers are "NO", then may proceed with Cephalosporin use.     Social History   Tobacco Use  . Smoking status: Never Smoker  . Smokeless tobacco: Never Used  Substance Use Topics  . Alcohol use: Not Currently  . Drug use: Never    [place]: + cardiac disease  Review of Systems  Constitutional: negative for fevers, chills, malaise and anorexia Gastrointestinal: negative for nausea and vomiting Integument/breast: negative for rash All other systems reviewed and are negative    Constitutional: in no apparent distress There were no vitals filed for this visit. EYES: anicteric ENMT:no thrush Cardiovascular: Cor RRR Respiratory: CTA B; normal respiratory effort Musculoskeletal: no pedal edema noted Skin: negatives: no rash Neuro: non-focal  Labs: Lab Results  Component Value Date   WBC 67.6 (HH) 08/19/2018   HGB 11.8 (L) 08/19/2018   HCT 39.7 08/19/2018  MCV 80.5 08/19/2018   PLT 210 08/19/2018    Lab Results  Component Value Date   CREATININE 0.93 08/19/2018   BUN 14 08/19/2018   NA 140 08/19/2018   K 4.0 08/19/2018   CL 105 08/19/2018   CO2 29 08/19/2018    Lab Results  Component Value Date   ALT 11 08/19/2018   AST 10 (L) 08/19/2018   ALKPHOS 51 08/19/2018   BILITOT 0.5 08/19/2018   INR 1.03 04/16/2018     Assessment: Pulmonary nodule of unknown etiology.  I discussed the potential etiologies including atypical infection and  including fungal infection.  Additionally I discussed that this may also be a nonspecific finding.  He is completely asymptomatic from this, therefore I do not feel any aggressive intervention or evaluation is indicated at this time.  I do want to do a sputum for fungus as well as other atypical and fungal blood test to see if there is any concerns but at this time with him being asymptomatic will just watch.  I discussed with him that he should call me if he develops any new symptoms or concerns.  Otherwise I will see him in 3 months.  I will consider bronchoscopy/pulmonary evaluation if he worsens or if the nodule continues to grow.  I would consider a follow-up CT scan in about 3 months though will defer to Dr. Maylon Peppers as he likely will need follow-up CT scans for his purposes.  Plan: 1) lab for fungal Ab, Fungitell, galactomanin, sputum 2) follow up in 3 months.

## 2018-08-28 LAB — FUNGAL ANTIBODIES PANEL, ID-BLOOD
Aspergillus Flavus AB: NEGATIVE
Aspergillus Niger Antibodies: NEGATIVE
Aspergillus fumigatus: NEGATIVE
Blastomyces Abs, Qn, DID: NEGATIVE
Coccidioides Antibody ID: NEGATIVE
Histoplasma Ab, Immunodiffusion: NEGATIVE

## 2018-08-28 LAB — FUNGITELL (1-3)-B-D-GLUCAN
(1-3)-B-D-Glucan: <31 pg/mL (ref <60)
Interpretation: NEGATIVE

## 2018-08-28 LAB — ASPERGILLUS ANTIGEN,SERUM
Aspergillus Ag, EIA: NOT DETECTED
Index Value: 0.06 (ref <0.50)

## 2018-08-30 MED FILL — IMBRUVICA 140 MG CAPSULE: 140 | 30 days supply | Qty: 90 | Fill #1

## 2018-09-17 ENCOUNTER — Encounter: Payer: Self-pay | Admitting: Hematology

## 2018-09-17 ENCOUNTER — Inpatient Hospital Stay (HOSPITAL_BASED_OUTPATIENT_CLINIC_OR_DEPARTMENT_OTHER): Payer: BLUE CROSS/BLUE SHIELD | Admitting: Hematology

## 2018-09-17 ENCOUNTER — Telehealth: Payer: Self-pay | Admitting: *Deleted

## 2018-09-17 ENCOUNTER — Inpatient Hospital Stay: Payer: BLUE CROSS/BLUE SHIELD | Attending: Hematology

## 2018-09-17 ENCOUNTER — Other Ambulatory Visit: Payer: Self-pay

## 2018-09-17 VITALS — BP 123/90 | HR 72 | Temp 98.4°F | Resp 18 | Ht 70.0 in | Wt 215.0 lb

## 2018-09-17 DIAGNOSIS — R911 Solitary pulmonary nodule: Secondary | ICD-10-CM | POA: Diagnosis not present

## 2018-09-17 DIAGNOSIS — C9110 Chronic lymphocytic leukemia of B-cell type not having achieved remission: Secondary | ICD-10-CM | POA: Diagnosis not present

## 2018-09-17 DIAGNOSIS — R21 Rash and other nonspecific skin eruption: Secondary | ICD-10-CM | POA: Diagnosis not present

## 2018-09-17 DIAGNOSIS — R161 Splenomegaly, not elsewhere classified: Secondary | ICD-10-CM | POA: Diagnosis not present

## 2018-09-17 DIAGNOSIS — Z79899 Other long term (current) drug therapy: Secondary | ICD-10-CM | POA: Insufficient documentation

## 2018-09-17 DIAGNOSIS — L270 Generalized skin eruption due to drugs and medicaments taken internally: Secondary | ICD-10-CM

## 2018-09-17 LAB — CMP (CANCER CENTER ONLY)
ALT: 12 U/L (ref 0–44)
AST: 11 U/L — ABNORMAL LOW (ref 15–41)
Albumin: 4.5 g/dL (ref 3.5–5.0)
Alkaline Phosphatase: 50 U/L (ref 38–126)
Anion gap: 6 (ref 5–15)
BUN: 13 mg/dL (ref 6–20)
CO2: 30 mmol/L (ref 22–32)
Calcium: 9.6 mg/dL (ref 8.9–10.3)
Chloride: 106 mmol/L (ref 98–111)
Creatinine: 0.93 mg/dL (ref 0.61–1.24)
GFR, Est AFR Am: >60 mL/min (ref >60)
GFR, Estimated: >60 mL/min (ref >60)
Glucose, Bld: 86 mg/dL (ref 70–99)
Potassium: 4.5 mmol/L (ref 3.5–5.1)
Sodium: 142 mmol/L (ref 135–145)
Total Bilirubin: 0.4 mg/dL (ref 0.3–1.2)
Total Protein: 6 g/dL — ABNORMAL LOW (ref 6.5–8.1)

## 2018-09-17 LAB — CBC WITH DIFFERENTIAL (CANCER CENTER ONLY)
Abs Immature Granulocytes: 0.11 10*3/uL — ABNORMAL HIGH (ref 0.00–0.07)
Basophils Absolute: 0.4 10*3/uL — ABNORMAL HIGH (ref 0.0–0.1)
Basophils Relative: 1 %
Eosinophils Absolute: 0.2 10*3/uL (ref 0.0–0.5)
Eosinophils Relative: 0 %
HCT: 44.5 % (ref 39.0–52.0)
Hemoglobin: 13.3 g/dL (ref 13.0–17.0)
Immature Granulocytes: 0 %
Lymphocytes Relative: 94 %
Lymphs Abs: 67.5 10*3/uL — ABNORMAL HIGH (ref 0.7–4.0)
MCH: 23.9 pg — ABNORMAL LOW (ref 26.0–34.0)
MCHC: 29.9 g/dL — ABNORMAL LOW (ref 30.0–36.0)
MCV: 80 fL (ref 80.0–100.0)
Monocytes Absolute: 0.8 10*3/uL (ref 0.1–1.0)
Monocytes Relative: 1 %
Neutro Abs: 3 10*3/uL (ref 1.7–7.7)
Neutrophils Relative %: 4 %
Platelet Count: 235 10*3/uL (ref 150–400)
RBC: 5.56 MIL/uL (ref 4.22–5.81)
RDW: 15.9 % — ABNORMAL HIGH (ref 11.5–15.5)
WBC Count: 72 10*3/uL (ref 4.0–10.5)
nRBC: 0 % (ref 0.0–0.2)

## 2018-09-17 LAB — SAVE SMEAR(SSMR), FOR PROVIDER SLIDE REVIEW

## 2018-09-17 LAB — LACTATE DEHYDROGENASE: LDH: 143 U/L (ref 98–192)

## 2018-09-17 MED ORDER — ATOVAQUONE 750 MG/5ML PO SUSP: 1500.0000 mg | Freq: Every day | ORAL | 2 refills | Status: AC

## 2018-09-17 NOTE — Telephone Encounter (Signed)
Critical Value WBC 72 Dr Maylon Peppers notified. No orders at this time

## 2018-09-17 NOTE — Progress Notes (Signed)
[facility] OFFICE PROGRESS NOTE  Patient Care Team: [name], MD as PCP - General (Family Medicine) Cordelia Poche, RN as Oncology Nurse Navigator [name], MD as Medical Oncologist (Hematology)  HEME/ONC OVERVIEW: 1. CLL with bulky lymphadenopathy; RAI Stage IV, intermediate risk; p53 deletion negative -03/2018:   PB flow showed CD5+, CD23+ monoclonal B-cells, c/w CLL; IgHV mutation negative  FISH (PB) positive for FFM(38G66); negative t(11;14), p53 deletion/amplification, and ATM (11q22.3) deletion  CT neck, CAP showed bulky lymphadenopathy in bilateral cervical LN's and throughout the chest, abdomen and pelvis; marked splenomegaly -04/2018: R axillary excision LN biopsy showed CLL/SLL (cyclin D1 neg) -05/2017 - present: ibrutinib   08/2018: interim CT CAP showed significant improvement in nearly all LN's and splenomegaly except a few LN's in the thorax and abnormal-appearing left lung nodule, possibly due to infection  TREATMENT REGIMEN:  05/14/2018 - present: ibrutinib 456m daily  ASSESSMENT & PLAN:   CLL with diffuse lymphadenopathy; RAI Stage IV, intermediate risk   -Patient is currently tolerating ibrutinib well -Treatment was initially complicated by fluid retention, rash, and diarrhea, which have mostly resolved -Hgb and plt counts have normalized since starting treatment -WBC is overall improving; while it is stable since last month, his other cytopenias have normalized, and we will continue ibrutinib 4260mdaily for now -We will plan to repeat scans in ~3 months (ie. early 11/2018) to assess any changes in his lymphadenopathy and to monitor the left lung nodule (see below) -Prophylaxis: acyclovir, atovaquone   Abnormal left lung nodule  -CT chest in 08/2018 showed "atoll sign," suggesting possible infection -Patient is currently asymptomatic  -he was referred to ID and underwent work-up for fungal infection, which was all negative -Per ID recs,  we will plan to repeat scans in ~3 months to monitor for any interval changes   Ibrutinib-associated rash -Overall improving, nearly resolved today -Continue topical fluticasone cream BID PRN  -If rash recurs or worsens, we can consider a short course of systemic steroid   Orders Placed This Encounter  Procedures  . CBC with Differential (Cancer Center Only)    Standing Status:   Future    Standing Expiration Date:   10/22/2019  . CMP ([facility])    Standing Status:   Future    Standing Expiration Date:   10/22/2019  . Save Smear ([facility])    Standing Status:   Future    Standing Expiration Date:   09/17/2019  . Lactate dehydrogenase    Standing Status:   Future    Standing Expiration Date:   10/22/2019    All questions were answered. The patient knows to call the clinic with any problems, questions or concerns. No barriers to learning was detected.  A total of more than 25 minutes were spent face-to-face with the patient during this encounter and over half of that time was spent on counseling and coordination of care as outlined above.   Return in 1 month for labs and clinic follow-up.  [name] 09/17/2018 12:38 PM  CHIEF COMPLAINT: "I am doing pretty well"  INTERVAL HISTORY: David Whitehead to clinic for follow-up of CLL on ibrutinib.  The patient reports that since starting the topical fluticasone cream, the rash is improved significantly and that itching has also resolved.  He denies any recurrent swelling in the extremities.  The lymph nodes have remained small in size, he denies any recurrent fever, chill, night sweats, weight loss, or progressive lymphadenopathy.  He has  gained some weight back since his recent illness in February 2020.  He denies any other complaint today.  SUMMARY OF ONCOLOGIC HISTORY:   CLL (chronic lymphocytic leukemia) (No Name)   04/01/2018 Initial Diagnosis    CLL (chronic lymphocytic leukemia) ([place])    04/05/2018 Pathology Results     Peripheral Blood Flow Cytometry - MONOCLONAL B-CELL POPULATION IDENTIFIED. - SEE NOTE.    04/05/2018 Pathology Results    PB FISH: No evidence of CCND1-IGH [t(11;14)] gene rearrangement. No evidence of trisomy 11 or gain of 11q. (excludes mantle cell lymphoma) No evidence of trisomy 12.  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 No evidence of p53 (17p13( deletion or amplification. No evidence of ATM (11q22.3) deletion.     04/07/2018 Imaging    CT neck w/ contrast: IMPRESSION: Bulky bilateral cervical lymphadenopathy consistent with lymphoproliferative disease/lymphoma.    04/07/2018 Imaging    CT CAP w/ contrast: IMPRESSION: 1. Bulky lymphadenopathy throughout the chest, abdomen and pelvis, as detailed above, highly suspicious for lymphoma. 2. Marked splenomegaly. 3. 9 mm pulmonary nodule within the anterior inferior portion of the RIGHT upper lobe. Subtle low-density nodule within the LEFT lower lobe. These are too small to definitively characterize. Neoplastic pulmonary nodules cannot be excluded. Consider PET-CT for further characterization.    04/16/2018 Surgery    Right axillary LN excisional biopsy    04/16/2018 Pathology Results    (Accession: [id number]) Lymph node for lymphoma, Right Axillary - CHRONIC LYMPHOCYTIC LEUKEMIA/SMALL LYMPHOCYTIC LYMPHOMA.     REVIEW OF SYSTEMS:   Constitutional: ( - ) fevers, ( - )  chills , ( - ) night sweats Eyes: ( - ) blurriness of vision, ( - ) double vision, ( - ) watery eyes Ears, nose, mouth, throat, and face: ( - ) mucositis, ( - ) sore throat Respiratory: ( - ) cough, ( - ) dyspnea, ( - ) wheezes Cardiovascular: ( - ) palpitation, ( - ) chest discomfort, ( - ) lower extremity swelling Gastrointestinal:  ( - ) nausea, ( - ) heartburn, ( - ) change in bowel habits Skin: ( - ) abnormal skin rashes Lymphatics: ( - ) new lymphadenopathy, ( - ) easy bruising Neurological: ( - ) numbness, ( - ) tingling, ( - )  new weaknesses Behavioral/Psych: ( - ) mood change, ( - ) new changes  All other systems were reviewed with the patient and are negative.  I have reviewed the past medical history, past surgical history, social history and family history with the patient and they are unchanged from previous note.  ALLERGIES:  is allergic to bactrim [sulfamethoxazole-trimethoprim] and amoxicillin.  MEDICATIONS:  Current Outpatient Medications  Medication Sig Dispense Refill  . acyclovir (ZOVIRAX) 400 MG tablet Take 400 mg by mouth 2 (two) times daily.     Marland Kitchen atovaquone (MEPRON) 750 MG/5ML suspension Take 10 mLs (1,500 mg total) by mouth daily. 900 mL 2  . fluticasone (CUTIVATE) 0.05 % cream Apply topically 2 (two) times daily. 30 g 0  . hydrOXYzine (ATARAX/VISTARIL) 10 MG tablet Take 10 mg by mouth 2 (two) times daily as needed.    . ibrutinib (IMBRUVICA) 140 MG capsul TAKE 3 CAPSULES (420 MG TOTAL) BY MOUTH DAILY. 90 capsule 3   No current facility-administered medications for this visit.     PHYSICAL EXAMINATION: ECOG PERFORMANCE STATUS: 0 - Asymptomatic  Today's Vitals   09/17/18 1111  BP: 123/90  Pulse: 72  Resp: 18  Temp: 98.4 F (36.9 C)  TempSrc: Oral  SpO2: 100%  Weight: 215 lb (97.5 kg)  Height: _0  (1.778 m)  PainSc: 0-No pain   Body mass index is 30.85 kg/m.  Filed Weights   09/17/18 1111  Weight: 215 lb (97.5 kg)    GENERAL: alert, no distress and comfortable SKIN: skin color, texture, turgor are normal, no rashes or significant lesions EYES: conjunctiva are pink and non-injected, sclera clear OROPHARYNX: no exudate, no erythema; lips, buccal mucosa, and tongue normal  NECK: supple, non-tender LYMPH:  no palpable lymphadenopathy in the cervical or axillary  LUNGS: clear to auscultation with normal breathing effort HEART: regular rate & rhythm and no murmurs and no lower extremity edema ABDOMEN: soft, non-tender, non-distended, normal bowel sounds Musculoskeletal: no  cyanosis of digits and no clubbing  PSYCH: alert & oriented x 3, fluent speech NEURO: no focal motor/sensory deficits  LABORATORY DATA:  I have reviewed the data as listed    Component Value Date/Time   NA 142 09/17/2018 1047   NA 142 03/31/2018 1649   K 4.5 09/17/2018 1047   CL 106 09/17/2018 1047   CO2 30 09/17/2018 1047   GLUCOSE 86 09/17/2018 1047   BUN 13 09/17/2018 1047   BUN 16 03/31/2018 1649   CREATININE 0.93 09/17/2018 1047   CALCIUM 9.6 09/17/2018 1047   PROT 6.0 (L) 09/17/2018 1047   PROT 5.9 (L) 03/31/2018 1649   ALBUMIN 4.5 09/17/2018 1047   ALBUMIN 4.6 03/31/2018 1649   AST 11 (L) 09/17/2018 1047   ALT 12 09/17/2018 1047   ALKPHOS 50 09/17/2018 1047   BILITOT 0.4 09/17/2018 1047   GFRNONAA >60 09/17/2018 1047   GFRAA >60 09/17/2018 1047    No results found for: SPEP, UPEP  Lab Results  Component Value Date   WBC 72.0 (HH) 09/17/2018   NEUTROABS 3.0 09/17/2018   HGB 13.3 09/17/2018   HCT 44.5 09/17/2018   MCV 80.0 09/17/2018   PLT 235 09/17/2018      Chemistry      Component Value Date/Time   NA 142 09/17/2018 1047   NA 142 03/31/2018 1649   K 4.5 09/17/2018 1047   CL 106 09/17/2018 1047   CO2 30 09/17/2018 1047   BUN 13 09/17/2018 1047   BUN 16 03/31/2018 1649   CREATININE 0.93 09/17/2018 1047      Component Value Date/Time   CALCIUM 9.6 09/17/2018 1047   ALKPHOS 50 09/17/2018 1047   AST 11 (L) 09/17/2018 1047   ALT 12 09/17/2018 1047   BILITOT 0.4 09/17/2018 1047

## 2018-09-20 ENCOUNTER — Telehealth: Payer: Self-pay | Admitting: Hematology

## 2018-09-20 NOTE — Telephone Encounter (Signed)
Appts scheduled letter/calendar mailes per 5/8 los

## 2018-09-21 ENCOUNTER — Telehealth: Payer: Self-pay | Admitting: Hematology

## 2018-09-21 NOTE — Telephone Encounter (Signed)
Appts scheduled letter/calendar mailed per 5/8 los

## 2018-09-22 ENCOUNTER — Telehealth: Payer: Self-pay | Admitting: Hematology

## 2018-09-22 NOTE — Telephone Encounter (Signed)
Appts scheduled letter/calendar mailed

## 2018-09-27 MED FILL — IMBRUVICA 140 MG CAPSULE: 140 | 30 days supply | Qty: 90 | Fill #2

## 2018-10-21 NOTE — Progress Notes (Signed)
[place] OFFICE PROGRESS NOTE  Patient Care Team: [name], MD as PCP - General (Family Medicine) Cordelia Poche, RN as Oncology Nurse Navigator [name], MD as Medical Oncologist (Hematology)  HEME/ONC OVERVIEW: 1. CLL with bulky lymphadenopathy; RAI Stage IV, intermediate risk; p53 deletion negative -03/2018:   PB flow showed CD5+, CD23+ monoclonal B-cells, c/w CLL; IgHV mutation negative  FISH (PB) positive for VOH(60V37); negative t(11;14), p53 deletion/amplification, and ATM (11q22.3) deletion  CT neck, CAP showed bulky lymphadenopathy in bilateral cervical LN's and throughout the chest, abdomen and pelvis; marked splenomegaly -04/2018: R axillary excision LN biopsy showed CLL/SLL (cyclin D1 neg) -05/2017 - present: ibrutinib   08/2018: interim CT CAP showed significant improvement in nearly all LN's and splenomegaly except a few LN's in the thorax and abnormal-appearing left lung nodule, possibly due to infection  TREATMENT REGIMEN:  05/14/2018 - present: ibrutinib 483m daily  ASSESSMENT & PLAN:   CLL with diffuse lymphadenopathy; RAI Stage IV, intermediate risk   -Currently tolerating ibrutinib well -Fluid retention, rash and diarrhea improved significantly -Continue ibrutinib for now  -I have ordered CT CAP in mid-11/2018 to assess interval changes in lymphadenopathy and the left lung nodules  -Prophylaxis: acyclovir, atovaquone   Leukocytosis -Secondary to CLL -Overall improving, WBC 54.8k today -Patient denies any symptoms of infection -We will continue to monitor it for now  Microcytosis -New; MCV 79, normal Hgb -Patient reports not eating any red meat products since 2019 due to weight gain -I have ordered iron profile today -I encouraged the patient to increase his dietary intake of iron as tolerated  Abnormal left lung nodule  -Incidentally noted on CT in 08/2018, possibly due to infection -Patient is asymptomatic -Per ID recs, we  will repeat scans in 11/2018 to monitor any interval changes   Orders Placed This Encounter  Procedures  . CT SOFT TISSUE NECK W CONTRAST    Standing Status:   Future    Standing Expiration Date:   10/22/2019    Order Specific Question:   ** REASON FOR EXAM (FREE TEXT)    Answer:   CLL on ibrutinib, monitor interim lymphadenopathy    Order Specific Question:   If indicated for the ordered procedure, I authorize the administration of contrast media per Radiology protocol    Answer:   Yes    Order Specific Question:   [facility]    [facility]   Order Specific Question:   Radiology Contrast Protocol - do NOT remove file path    Answer:   _0 [email or web address]  . CT CHEST W CONTRAST    Standing Status:   Future    Standing Expiration Date:   10/22/2019    Order Specific Question:   ** REASON FOR EXAM (FREE TEXT)    Answer:   CLL on ibrutinib, assess lymphadenopathy    Order Specific Question:   If indicated for the ordered procedure, I authorize the administration of contrast media per Radiology protocol    Answer:   Yes    Order Specific Question:   [facility]    [facility]   Order Specific Question:   Radiology Contrast Protocol - do NOT remove file path    Answer:   _1 [email or web address]  . CT ABDOMEN PELVIS W CONTRAST    Standing Status:   Future    Standing Expiration Date:   10/22/2019    Order Specific Question:   **  REASON FOR EXAM (FREE TEXT)    Answer:   CLL on ibrutinib, assess lymphadenopathy    Order Specific Question:   If indicated for the ordered procedure, I authorize the administration of contrast media per Radiology protocol    Answer:   Yes    Order Specific Question:   Preferred imaging location?    Answer:   [facility]    Order Specific Question:   Is Oral Contrast requested for this exam?    Answer:   Yes, Per Radiology protocol    Order  Specific Question:   Radiology Contrast Protocol - do NOT remove file path    Answer:   \\[email or web address]  . Iron and TIBC    Standing Status:   Future    Number of Occurrences:   1    Standing Expiration Date:   11/26/2019  . Ferritin    Standing Status:   Future    Number of Occurrences:   1    Standing Expiration Date:   11/26/2019    All questions were answered. The patient knows to call the clinic with any problems, questions or concerns. No barriers to learning was detected.  Return in 1 month for labs, imaging results and clinic follow-up.   [name], MD 10/22/2018 12:01 PM  CHIEF COMPLAINT: "I am doing fine"  INTERVAL HISTORY: Mr. Rising returns to clinic for follow-up of CLL on ibrutinib.  Patient reports that he has been doing well well since last visit, and denies any recurrent rash, swelling in extremities, or pruritus.  He apparently had quit eating any red meat products since early 2019 due to weight gain, and eats mostly chicken and [place] as well as vegetables and fruits.  He denies any fever, chill, recurrent lymphadenopathy, or weight loss.  He denies any other complaint today.  SUMMARY OF ONCOLOGIC HISTORY: Oncology History  CLL (chronic lymphocytic leukemia) ([place])  04/01/2018 Initial Diagnosis   CLL (chronic lymphocytic leukemia) ([place])   04/05/2018 Pathology Results   Peripheral Blood Flow Cytometry - MONOCLONAL B-CELL POPULATION IDENTIFIED. - SEE NOTE.   04/05/2018 Pathology Results   PB FISH: No evidence of CCND1-IGH [t(11;14)] gene rearrangement. No evidence of trisomy 11 or gain of 11q. (excludes mantle cell lymphoma) No evidence of trisomy 12.  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 No evidence of p53 (17p13( deletion or amplification. No evidence of ATM (11q22.3) deletion.    04/07/2018 Imaging   CT neck w/ contrast: IMPRESSION: Bulky bilateral cervical lymphadenopathy consistent with  lymphoproliferative disease/lymphoma.   04/07/2018 Imaging   CT CAP w/ contrast: IMPRESSION: 1. Bulky lymphadenopathy throughout the chest, abdomen and pelvis, as detailed above, highly suspicious for lymphoma. 2. Marked splenomegaly. 3. 9 mm pulmonary nodule within the anterior inferior portion of the RIGHT upper lobe. Subtle low-density nodule within the LEFT lower lobe. These are too small to definitively characterize. Neoplastic pulmonary nodules cannot be excluded. Consider PET-CT for further characterization.   04/16/2018 Surgery   Right axillary LN excisional biopsy   04/16/2018 Pathology Results   (Accession: [id number]) Lymph node for lymphoma, Right Axillary - CHRONIC LYMPHOCYTIC LEUKEMIA/SMALL LYMPHOCYTIC LYMPHOMA.     REVIEW OF SYSTEMS:   Constitutional: ( - ) fevers, ( - )  chills , ( - ) night sweats Eyes: ( - ) blurriness of vision, ( - ) double vision, ( - ) watery eyes Ears, nose, mouth, throat, and face: ( - ) mucositis, ( - ) sore throat Respiratory: ( - ) cough, ( - )  dyspnea, ( - ) wheezes Cardiovascular: ( - ) palpitation, ( - ) chest discomfort, ( - ) lower extremity swelling Gastrointestinal:  ( - ) nausea, ( - ) heartburn, ( - ) change in bowel habits Skin: ( - ) abnormal skin rashes Lymphatics: ( - ) new lymphadenopathy, ( - ) easy bruising Neurological: ( - ) numbness, ( - ) tingling, ( - ) new weaknesses Behavioral/Psych: ( - ) mood change, ( - ) new changes  All other systems were reviewed with the patient and are negative.  I have reviewed the past medical history, past surgical history, social history and family history with the patient and they are unchanged from previous note.  ALLERGIES:  is allergic to bactrim [sulfamethoxazole-trimethoprim] and amoxicillin.  MEDICATIONS:  Current Outpatient Medications  Medication Sig Dispense Refill  . acyclovir (ZOVIRAX) 400 MG tablet Take 400 mg by mouth 2 (two) times daily.     Marland Kitchen atovaquone (MEPRON) 750  MG/5ML suspension Take 10 mLs (1,500 mg total) by mouth daily. 900 mL 2  . fluticasone (CUTIVATE) 0.05 % cream Apply topically 2 (two) times daily. 30 g 0  . hydrOXYzine (ATARAX/VISTARIL) 10 MG tablet Take 10 mg by mouth 2 (two) times daily as needed.    . ibrutinib (IMBRUVICA) 140 MG capsul TAKE 3 CAPSULES (420 MG TOTAL) BY MOUTH DAILY. 90 capsule 3   No current facility-administered medications for this visit.     PHYSICAL EXAMINATION: ECOG PERFORMANCE STATUS: 0 - Asymptomatic  Today's Vitals   10/22/18 1120  BP: (!) 142/84  Pulse: 70  Resp: 19  SpO2: 100%   There is no height or weight on file to calculate BMI.  There were no vitals filed for this visit.  GENERAL: alert, no distress and comfortable SKIN: skin color, texture, turgor are normal, no rashes or significant lesions EYES: conjunctiva are pink and non-injected, sclera clear OROPHARYNX: no exudate, no erythema; lips, buccal mucosa, and tongue normal  NECK: supple, non-tender LYMPH:  no palpable lymphadenopathy in the cervical LUNGS: clear to auscultation with normal breathing effort HEART: regular rate & rhythm and no murmurs and no lower extremity edema ABDOMEN: soft, non-tender, non-distended, normal bowel sounds Musculoskeletal: no cyanosis of digits and no clubbing  PSYCH: alert & oriented x 3, fluent speech NEURO: no focal motor/sensory deficits  LABORATORY DATA:  I have reviewed the data as listed    Component Value Date/Time   NA 141 10/22/2018 1108   NA 142 03/31/2018 1649   K 3.8 10/22/2018 1108   CL 104 10/22/2018 1108   CO2 28 10/22/2018 1108   GLUCOSE 100 (H) 10/22/2018 1108   BUN 14 10/22/2018 1108   BUN 16 03/31/2018 1649   CREATININE 0.98 10/22/2018 1108   CALCIUM 10.0 10/22/2018 1108   PROT 6.4 (L) 10/22/2018 1108   PROT 5.9 (L) 03/31/2018 1649   ALBUMIN 4.7 10/22/2018 1108   ALBUMIN 4.6 03/31/2018 1649   AST 12 (L) 10/22/2018 1108   ALT 16 10/22/2018 1108   ALKPHOS 45 10/22/2018 1108    BILITOT 0.4 10/22/2018 1108   GFRNONAA >60 10/22/2018 1108   GFRAA >60 10/22/2018 1108    No results found for: SPEP, UPEP  Lab Results  Component Value Date   WBC 54.8 (HH) 10/22/2018   NEUTROABS 3.8 10/22/2018   HGB 14.4 10/22/2018   HCT 46.2 10/22/2018   MCV 79.4 (L) 10/22/2018   PLT 230 10/22/2018      Chemistry      Component Value  Date/Time   NA 141 10/22/2018 1108   NA 142 03/31/2018 1649   K 3.8 10/22/2018 1108   CL 104 10/22/2018 1108   CO2 28 10/22/2018 1108   BUN 14 10/22/2018 1108   BUN 16 03/31/2018 1649   CREATININE 0.98 10/22/2018 1108      Component Value Date/Time   CALCIUM 10.0 10/22/2018 1108   ALKPHOS 45 10/22/2018 1108   AST 12 (L) 10/22/2018 1108   ALT 16 10/22/2018 1108   BILITOT 0.4 10/22/2018 1108

## 2018-10-22 ENCOUNTER — Other Ambulatory Visit: Payer: Self-pay

## 2018-10-22 ENCOUNTER — Inpatient Hospital Stay: Payer: Self-pay | Attending: Hematology

## 2018-10-22 ENCOUNTER — Telehealth: Payer: Self-pay | Admitting: *Deleted

## 2018-10-22 ENCOUNTER — Encounter: Payer: Self-pay | Admitting: Hematology

## 2018-10-22 ENCOUNTER — Inpatient Hospital Stay (HOSPITAL_BASED_OUTPATIENT_CLINIC_OR_DEPARTMENT_OTHER): Payer: BC Managed Care – PPO | Admitting: Hematology

## 2018-10-22 VITALS — BP 142/84 | HR 70 | Resp 19

## 2018-10-22 DIAGNOSIS — R718 Other abnormality of red blood cells: Secondary | ICD-10-CM | POA: Insufficient documentation

## 2018-10-22 DIAGNOSIS — R918 Other nonspecific abnormal finding of lung field: Secondary | ICD-10-CM | POA: Insufficient documentation

## 2018-10-22 DIAGNOSIS — Z79899 Other long term (current) drug therapy: Secondary | ICD-10-CM | POA: Insufficient documentation

## 2018-10-22 DIAGNOSIS — D72829 Elevated white blood cell count, unspecified: Secondary | ICD-10-CM | POA: Insufficient documentation

## 2018-10-22 DIAGNOSIS — Z7951 Long term (current) use of inhaled steroids: Secondary | ICD-10-CM

## 2018-10-22 DIAGNOSIS — D72820 Lymphocytosis (symptomatic): Secondary | ICD-10-CM

## 2018-10-22 DIAGNOSIS — C9110 Chronic lymphocytic leukemia of B-cell type not having achieved remission: Secondary | ICD-10-CM | POA: Diagnosis not present

## 2018-10-22 DIAGNOSIS — R911 Solitary pulmonary nodule: Secondary | ICD-10-CM

## 2018-10-22 LAB — CBC WITH DIFFERENTIAL (CANCER CENTER ONLY)
Abs Immature Granulocytes: 0.09 10*3/uL — ABNORMAL HIGH (ref 0.00–0.07)
Basophils Absolute: 0.2 10*3/uL — ABNORMAL HIGH (ref 0.0–0.1)
Basophils Relative: 0 %
Eosinophils Absolute: 0.3 10*3/uL (ref 0.0–0.5)
Eosinophils Relative: 1 %
HCT: 46.2 % (ref 39.0–52.0)
Hemoglobin: 14.4 g/dL (ref 13.0–17.0)
Immature Granulocytes: 0 %
Lymphocytes Relative: 91 %
Lymphs Abs: 49.8 10*3/uL — ABNORMAL HIGH (ref 0.7–4.0)
MCH: 24.7 pg — ABNORMAL LOW (ref 26.0–34.0)
MCHC: 31.2 g/dL (ref 30.0–36.0)
MCV: 79.4 fL — ABNORMAL LOW (ref 80.0–100.0)
Monocytes Absolute: 0.6 10*3/uL (ref 0.1–1.0)
Monocytes Relative: 1 %
Neutro Abs: 3.8 10*3/uL (ref 1.7–7.7)
Neutrophils Relative %: 7 %
Platelet Count: 230 10*3/uL (ref 150–400)
RBC: 5.82 MIL/uL — ABNORMAL HIGH (ref 4.22–5.81)
RDW: 15.2 % (ref 11.5–15.5)
WBC Count: 54.8 10*3/uL (ref 4.0–10.5)
nRBC: 0 % (ref 0.0–0.2)

## 2018-10-22 LAB — CMP (CANCER CENTER ONLY)
ALT: 16 U/L (ref 0–44)
AST: 12 U/L — ABNORMAL LOW (ref 15–41)
Albumin: 4.7 g/dL (ref 3.5–5.0)
Alkaline Phosphatase: 45 U/L (ref 38–126)
Anion gap: 9 (ref 5–15)
BUN: 14 mg/dL (ref 6–20)
CO2: 28 mmol/L (ref 22–32)
Calcium: 10 mg/dL (ref 8.9–10.3)
Chloride: 104 mmol/L (ref 98–111)
Creatinine: 0.98 mg/dL (ref 0.61–1.24)
GFR, Est AFR Am: >60 mL/min (ref >60)
GFR, Estimated: >60 mL/min (ref >60)
Glucose, Bld: 100 mg/dL — ABNORMAL HIGH (ref 70–99)
Potassium: 3.8 mmol/L (ref 3.5–5.1)
Sodium: 141 mmol/L (ref 135–145)
Total Bilirubin: 0.4 mg/dL (ref 0.3–1.2)
Total Protein: 6.4 g/dL — ABNORMAL LOW (ref 6.5–8.1)

## 2018-10-22 LAB — SAVE SMEAR (SSMR)

## 2018-10-22 NOTE — Telephone Encounter (Signed)
Dr. Maylon Peppers notified of WBC-54.8.  No new orders received at this time.

## 2018-10-25 ENCOUNTER — Telehealth: Payer: Self-pay | Admitting: Hematology

## 2018-10-25 LAB — FERRITIN: Ferritin: 10 ng/mL — ABNORMAL LOW (ref 24–336)

## 2018-10-25 LAB — IRON AND TIBC
Iron: 37 ug/dL — ABNORMAL LOW (ref 42–163)
Saturation Ratios: 9 % — ABNORMAL LOW (ref 20–55)
TIBC: 424 ug/dL — ABNORMAL HIGH (ref 202–409)
UIBC: 387 ug/dL — ABNORMAL HIGH (ref 117–376)

## 2018-10-25 LAB — LACTATE DEHYDROGENASE: LDH: 148 U/L (ref 98–192)

## 2018-10-25 NOTE — Telephone Encounter (Signed)
Called and LM for patient with date/time of next appt/ letter/calendar mailed/ per 6/12 los

## 2018-10-28 MED FILL — IMBRUVICA 140 MG CAPSULE: 140 | 30 days supply | Qty: 90 | Fill #3

## 2018-11-05 ENCOUNTER — Encounter: Payer: Self-pay | Admitting: *Deleted

## 2018-11-05 ENCOUNTER — Encounter: Payer: Self-pay | Admitting: Hematology

## 2018-11-22 ENCOUNTER — Other Ambulatory Visit: Payer: Self-pay | Admitting: Hematology

## 2018-11-22 DIAGNOSIS — C9110 Chronic lymphocytic leukemia of B-cell type not having achieved remission: Secondary | ICD-10-CM

## 2018-11-23 ENCOUNTER — Other Ambulatory Visit: Payer: Self-pay

## 2018-11-23 ENCOUNTER — Telehealth: Payer: Self-pay | Admitting: Internal Medicine

## 2018-11-23 ENCOUNTER — Ambulatory Visit (HOSPITAL_COMMUNITY)
Admission: RE | Admit: 2018-11-23 | Discharge: 2018-11-23 | Disposition: A | Discharge status: Home / Self Care | Payer: BC Managed Care – PPO | Source: Ambulatory Visit | Attending: Hematology | Admitting: Hematology

## 2018-11-23 DIAGNOSIS — R911 Solitary pulmonary nodule: Secondary | ICD-10-CM | POA: Diagnosis not present

## 2018-11-23 DIAGNOSIS — C9110 Chronic lymphocytic leukemia of B-cell type not having achieved remission: Secondary | ICD-10-CM | POA: Insufficient documentation

## 2018-11-23 MED ORDER — IOHEXOL 300 MG/ML  SOLN
150.0000 mL | Freq: Once | INTRAMUSCULAR | Status: AC | PRN
  Administered 2018-11-23: 16:00:00 150 mL via INTRAVENOUS

## 2018-11-23 NOTE — Telephone Encounter (Signed)
COVID-19 Pre-Screening Questions:11/23/18 ° °Do you currently have a fever (>100 °F), chills or unexplained body aches? NO ° °Are you currently experiencing new cough, shortness of breath, sore throat, runny nose? NO °•  °Have you recently travelled outside the state of [place] in the last 14 days? NO  °•  °Have you been in contact with someone that is currently pending confirmation of Covid19 testing or has been confirmed to have the Covid19 virus?  NO  ° °**If the patient answers NO to ALL questions -  advise the patient to please call the clinic before coming to the office should any symptoms develop.  ° ° °

## 2018-11-24 ENCOUNTER — Encounter: Payer: Self-pay | Admitting: Internal Medicine

## 2018-11-24 ENCOUNTER — Ambulatory Visit (INDEPENDENT_AMBULATORY_CARE_PROVIDER_SITE_OTHER): Payer: BC Managed Care – PPO | Admitting: Internal Medicine

## 2018-11-24 DIAGNOSIS — R911 Solitary pulmonary nodule: Secondary | ICD-10-CM | POA: Diagnosis not present

## 2018-11-24 DIAGNOSIS — D72820 Lymphocytosis (symptomatic): Secondary | ICD-10-CM

## 2018-11-24 NOTE — Progress Notes (Signed)
   Subjective:    Patient ID: David Whitehead, male    DOB: [birth year], 48 y.o.   MRN: [id number]  HPI Here for follow up of pulmonary nodule. I saw him in April with a  Right pulmonary nodule of 79mm on recent CT of his chest done by Dr. Maylon Peppers for his CLL and 5 mm on his left side.  It was felt to represent a treatment effect.  Follow up CT scan done yesterday with stable size of the nodules (5 and 5 mm, respectively).  He continues with no significant symptoms of cough, sob or pleurisy.  No fever, night sweats. All labs were negative for any fungal antigens or antibodies.  He does not produce much sputum so did not bring back a sputum sample.     Review of Systems  Constitutional: Negative for chills, fatigue and fever.  Gastrointestinal: Negative for diarrhea and nausea.  Skin: Negative for rash.       Objective:   Physical Exam Constitutional:      Appearance: Normal appearance.  Eyes:     General: No scleral icterus. Pulmonary:     Effort: Pulmonary effort is normal. No respiratory distress.  Skin:    Findings: No rash.  Neurological:     Mental Status: He is alert.  Psychiatric:        Mood and Affect: Mood normal.           Assessment & Plan:

## 2018-11-24 NOTE — Assessment & Plan Note (Signed)
Stable and cw his CLL

## 2018-11-24 NOTE — Assessment & Plan Note (Signed)
No concerns on follow up CT and no positive labs.  Most c/w non-infectious process and no indication for any further evaluation.  He can follow up here as needed

## 2018-11-26 ENCOUNTER — Other Ambulatory Visit: Payer: Self-pay | Admitting: Hematology

## 2018-11-26 ENCOUNTER — Encounter: Payer: Self-pay | Admitting: Hematology

## 2018-11-26 ENCOUNTER — Other Ambulatory Visit: Payer: Self-pay

## 2018-11-26 ENCOUNTER — Inpatient Hospital Stay (HOSPITAL_BASED_OUTPATIENT_CLINIC_OR_DEPARTMENT_OTHER): Payer: BC Managed Care – PPO | Admitting: Hematology

## 2018-11-26 ENCOUNTER — Inpatient Hospital Stay: Payer: BC Managed Care – PPO | Attending: Hematology

## 2018-11-26 ENCOUNTER — Telehealth: Payer: Self-pay | Admitting: Hematology

## 2018-11-26 VITALS — BP 130/85 | HR 62 | Temp 98.2°F | Resp 18 | Ht 70.0 in | Wt 217.0 lb

## 2018-11-26 DIAGNOSIS — C9110 Chronic lymphocytic leukemia of B-cell type not having achieved remission: Secondary | ICD-10-CM

## 2018-11-26 DIAGNOSIS — R918 Other nonspecific abnormal finding of lung field: Secondary | ICD-10-CM

## 2018-11-26 DIAGNOSIS — Z79899 Other long term (current) drug therapy: Secondary | ICD-10-CM | POA: Diagnosis not present

## 2018-11-26 DIAGNOSIS — Z800 Family history of malignant neoplasm of digestive organs: Secondary | ICD-10-CM | POA: Insufficient documentation

## 2018-11-26 DIAGNOSIS — R718 Other abnormality of red blood cells: Secondary | ICD-10-CM | POA: Insufficient documentation

## 2018-11-26 DIAGNOSIS — R911 Solitary pulmonary nodule: Secondary | ICD-10-CM

## 2018-11-26 DIAGNOSIS — D72820 Lymphocytosis (symptomatic): Secondary | ICD-10-CM

## 2018-11-26 LAB — CMP (CANCER CENTER ONLY)
ALT: 17 U/L (ref 0–44)
AST: 12 U/L — ABNORMAL LOW (ref 15–41)
Albumin: 4.4 g/dL (ref 3.5–5.0)
Alkaline Phosphatase: 44 U/L (ref 38–126)
Anion gap: 6 (ref 5–15)
BUN: 14 mg/dL (ref 6–20)
CO2: 30 mmol/L (ref 22–32)
Calcium: 8.8 mg/dL — ABNORMAL LOW (ref 8.9–10.3)
Chloride: 103 mmol/L (ref 98–111)
Creatinine: 0.98 mg/dL (ref 0.61–1.24)
GFR, Est AFR Am: >60 mL/min (ref >60)
GFR, Estimated: >60 mL/min (ref >60)
Glucose, Bld: 97 mg/dL (ref 70–99)
Potassium: 4.2 mmol/L (ref 3.5–5.1)
Sodium: 139 mmol/L (ref 135–145)
Total Bilirubin: 0.6 mg/dL (ref 0.3–1.2)
Total Protein: 5.9 g/dL — ABNORMAL LOW (ref 6.5–8.1)

## 2018-11-26 LAB — CBC WITH DIFFERENTIAL (CANCER CENTER ONLY)
Abs Immature Granulocytes: 0.05 10*3/uL (ref 0.00–0.07)
Basophils Absolute: 0.2 10*3/uL — ABNORMAL HIGH (ref 0.0–0.1)
Basophils Relative: 1 %
Eosinophils Absolute: 0.2 10*3/uL (ref 0.0–0.5)
Eosinophils Relative: 1 %
HCT: 47.5 % (ref 39.0–52.0)
Hemoglobin: 14.6 g/dL (ref 13.0–17.0)
Immature Granulocytes: 0 %
Lymphocytes Relative: 87 %
Lymphs Abs: 35.1 10*3/uL — ABNORMAL HIGH (ref 0.7–4.0)
MCH: 24.6 pg — ABNORMAL LOW (ref 26.0–34.0)
MCHC: 30.7 g/dL (ref 30.0–36.0)
MCV: 80 fL (ref 80.0–100.0)
Monocytes Absolute: 0.6 10*3/uL (ref 0.1–1.0)
Monocytes Relative: 2 %
Neutro Abs: 3.5 10*3/uL (ref 1.7–7.7)
Neutrophils Relative %: 9 %
Platelet Count: 212 10*3/uL (ref 150–400)
RBC: 5.94 MIL/uL — ABNORMAL HIGH (ref 4.22–5.81)
RDW: 16 % — ABNORMAL HIGH (ref 11.5–15.5)
WBC Count: 39.7 10*3/uL — ABNORMAL HIGH (ref 4.0–10.5)
nRBC: 0 % (ref 0.0–0.2)

## 2018-11-26 MED ORDER — IMBRUVICA 140 MG PO CAPS: 420.0000 mg | ORAL_CAPSULE | Freq: Every day | ORAL | 3 refills | Status: DC

## 2018-11-26 MED ORDER — IMBRUVICA 140 MG PO CAPS: 420.0000 mg | ORAL_CAPSULE | Freq: Every day | ORAL | 5 refills | Status: AC

## 2018-11-26 NOTE — Telephone Encounter (Signed)
Appointments scheduled letter/calendar mailed per 7/17 los °

## 2018-11-26 NOTE — Progress Notes (Signed)
[place] OFFICE PROGRESS NOTE  Patient Care Team: [name], MD as PCP - General (Family Medicine) Cordelia Poche, RN as Oncology Nurse Navigator [name], MD as Medical Oncologist (Hematology)  HEME/ONC OVERVIEW: 1. CLL with bulky lymphadenopathy; RAI Stage IV, intermediate risk; p53 deletion negative -03/2018:   PB flow showed CD5+, CD23+ monoclonal B-cells, c/w CLL; IgHV mutation negative  FISH (PB) positive for NTZ([id number]); negative t(11;14), p53 deletion/amplification, and ATM (11q22.3) deletion  CT neck, CAP showed bulky lymphadenopathy in bilateral cervical LN's and throughout the chest, abdomen and pelvis; marked splenomegaly -04/2018: R axillary excision LN biopsy showed CLL/SLL (cyclin D1 neg) -05/2017 - present: ibrutinib   11/2018: CT CAP showed near normalization of all LN's except R hilar LN ~3.8cm (improving)  TREATMENT REGIMEN:  05/14/2018 - present: ibrutinib 465m daily  ASSESSMENT & PLAN:  CLL with diffuse lymphadenopathy; RAI Stage IV, intermediate risk   -I independently reviewed radiologic images of recent CT neck, chest, and AP, and agree with findings as documented -In summary, CT showed normalization of nearly all lymph nodes except a moderately persistent hilar LN measuring ~3.8cm -He is tolerating ibrutinib well without significant side effects at this time -Continue ibrutinib for now  -We will plan for periodic image monitoring, next in 3 to 6 months -Prophylaxis: acyclovir, atovaquone   Leukocytosis -Secondary to CLL -Overall improving, WBC 39.7k today  -Patient denies any symptoms of infection -We will continue to monitor it for now  Microcytosis -Iron profile consistent w/ iron deficiency -Patient denies any symptoms of GI bleeding, but does have a significant family hx of colon cancer (father died of colon cancer in his 711'sand paternal uncle died of colon cancer in his 59's -Currently on one pre-natal vitamin with  iron daily  -I have referred the patient to GI for further evaluation to rule out any occult GI bleeding, given the family hx of colon cancer   Hypocalcemia -Ca 8.8 today, new -Patient is asymptomatic -Continue daily multivitamin -We will monitor it for now   Abnormal left lung nodule  -Fungal infection work-up negative; patient is asymptomatic  -Lung nodule decreasing in size on the most recent CT in 11/2018  -Per ID, no further work-up or monitoring indicated  Orders Placed This Encounter  Procedures  . CBC with Differential (Cancer Center Only)    Standing Status:   Future    Standing Expiration Date:   12/31/2019  . CMP ([facility])    Standing Status:   Future    Standing Expiration Date:   12/31/2019  . Save Smear ([facility])    Standing Status:   Future    Standing Expiration Date:   11/26/2019  . Lactate dehydrogenase    Standing Status:   Future    Standing Expiration Date:   12/31/2019    All questions were answered. The patient knows to call the clinic with any problems, questions or concerns. No barriers to learning was detected.  Return in 1 month for labs and clinic follow-up.   YTish Men MD 11/26/2018 12:18 PM  CHIEF COMPLAINT: "I am feeling fine"  INTERVAL HISTORY: David Whitehead to clinic for follow-up of CLL ibrutinib.  Patient reports that he has been doing well on ibrutinib treatment and denies any side effect so far.  He recently saw ID, who did not think any further work-up was needed, and [facility]  He has been taking pre-natal vitamin twice a day after he  was informed of being iron deficient.  He is tolerating it well without any constipation or GI upset.  He denies any symptoms of GI bleeding, such as hematochezia or melena.  Of note, patient's father died of colon cancer in his 15s and his paternal uncle died of colon cancer in his 53s.  SUMMARY OF ONCOLOGIC HISTORY: Oncology History  CLL (chronic lymphocytic  leukemia) ([place])  04/01/2018 Initial Diagnosis   CLL (chronic lymphocytic leukemia) ([place])   04/05/2018 Pathology Results   Peripheral Blood Flow Cytometry - MONOCLONAL B-CELL POPULATION IDENTIFIED. - SEE NOTE.   04/05/2018 Pathology Results   PB FISH: No evidence of CCND1-IGH [t(11;14)] gene rearrangement. No evidence of trisomy 11 or gain of 11q. (excludes mantle cell lymphoma) No evidence of trisomy 12.  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 No evidence of p53 (17p13( deletion or amplification. No evidence of ATM (11q22.3) deletion.    04/07/2018 Imaging   CT neck w/ contrast: IMPRESSION: Bulky bilateral cervical lymphadenopathy consistent with lymphoproliferative disease/lymphoma.   04/07/2018 Imaging   CT CAP w/ contrast: IMPRESSION: 1. Bulky lymphadenopathy throughout the chest, abdomen and pelvis, as detailed above, highly suspicious for lymphoma. 2. Marked splenomegaly. 3. 9 mm pulmonary nodule within the anterior inferior portion of the RIGHT upper lobe. Subtle low-density nodule within the LEFT lower lobe. These are too small to definitively characterize. Neoplastic pulmonary nodules cannot be excluded. Consider PET-CT for further characterization.   04/16/2018 Surgery   Right axillary LN excisional biopsy   04/16/2018 Pathology Results   (Accession: [id number]) Lymph node for lymphoma, Right Axillary - CHRONIC LYMPHOCYTIC LEUKEMIA/SMALL LYMPHOCYTIC LYMPHOMA.     REVIEW OF SYSTEMS:   Constitutional: ( - ) fevers, ( - )  chills , ( - ) night sweats Eyes: ( - ) blurriness of vision, ( - ) double vision, ( - ) watery eyes Ears, nose, mouth, throat, and face: ( - ) mucositis, ( - ) sore throat Respiratory: ( - ) cough, ( - ) dyspnea, ( - ) wheezes Cardiovascular: ( - ) palpitation, ( - ) chest discomfort, ( - ) lower extremity swelling Gastrointestinal:  ( - ) nausea, ( - ) heartburn, ( - ) change in bowel habits Skin: ( - ) abnormal skin  rashes Lymphatics: ( - ) new lymphadenopathy, ( - ) easy bruising Neurological: ( - ) numbness, ( - ) tingling, ( - ) new weaknesses Behavioral/Psych: ( - ) mood change, ( - ) new changes  All other systems were reviewed with the patient and are negative.  I have reviewed the past medical history, past surgical history, social history and family history with the patient and they are unchanged from previous note.  ALLERGIES:  is allergic to bactrim [sulfamethoxazole-trimethoprim] and amoxicillin.  MEDICATIONS:  Current Outpatient Medications  Medication Sig Dispense Refill  . acyclovir (ZOVIRAX) 400 MG tablet Take 400 mg by mouth 2 (two) times daily.     Marland Kitchen atovaquone (MEPRON) 750 MG/5ML suspension Take 10 mLs (1,500 mg total) by mouth daily. 900 mL 2  . fluticasone (CUTIVATE) 0.05 % cream Apply topically 2 (two) times daily. 30 g 0  . hydrOXYzine (ATARAX/VISTARIL) 10 MG tablet Take 10 mg by mouth 2 (two) times daily as needed.    . ibrutinib (IMBRUVICA) 140 MG capsul Take 3 capsules (420 mg total) by mouth daily. 90 capsule 5   No current facility-administered medications for this visit.     PHYSICAL EXAMINATION: ECOG PERFORMANCE STATUS: 0 - Asymptomatic  Today's  Vitals   11/26/18 1155  BP: 130/85  Pulse: 62  Resp: 18  Temp: 98.2 F (36.8 C)  TempSrc: Oral  SpO2: 100%  Weight: 217 lb (98.4 kg)  Height: '5\' 10"'  (1.778 m)  PainSc: 0-No pain   Body mass index is 31.14 kg/m.  Filed Weights   11/26/18 1155  Weight: 217 lb (98.4 kg)    GENERAL: alert, no distress and comfortable SKIN: skin color, texture, turgor are normal, no rashes or significant lesions EYES: conjunctiva are pink and non-injected, sclera clear OROPHARYNX: no exudate, no erythema; lips, buccal mucosa, and tongue normal  NECK: supple, non-tender LYMPH:  no palpable lymphadenopathy in the cervical LUNGS: clear to auscultation with normal breathing effort HEART: regular rate & rhythm and no murmurs and no  lower extremity edema ABDOMEN: soft, non-tender, non-distended, normal bowel sounds Musculoskeletal: no cyanosis of digits and no clubbing  PSYCH: alert & oriented x 3, fluent speech NEURO: no focal motor/sensory deficits  LABORATORY DATA:  I have reviewed the data as listed    Component Value Date/Time   NA 139 11/26/2018 1056   NA 142 03/31/2018 1649   K 4.2 11/26/2018 1056   CL 103 11/26/2018 1056   CO2 30 11/26/2018 1056   GLUCOSE 97 11/26/2018 1056   BUN 14 11/26/2018 1056   BUN 16 03/31/2018 1649   CREATININE 0.98 11/26/2018 1056   CALCIUM 8.8 (L) 11/26/2018 1056   PROT 5.9 (L) 11/26/2018 1056   PROT 5.9 (L) 03/31/2018 1649   ALBUMIN 4.4 11/26/2018 1056   ALBUMIN 4.6 03/31/2018 1649   AST 12 (L) 11/26/2018 1056   ALT 17 11/26/2018 1056   ALKPHOS 44 11/26/2018 1056   BILITOT 0.6 11/26/2018 1056   GFRNONAA >60 11/26/2018 1056   GFRAA >60 11/26/2018 1056    No results found for: SPEP, UPEP  Lab Results  Component Value Date   WBC 39.7 (H) 11/26/2018   NEUTROABS 3.5 11/26/2018   HGB 14.6 11/26/2018   HCT 47.5 11/26/2018   MCV 80.0 11/26/2018   PLT 212 11/26/2018      Chemistry      Component Value Date/Time   NA 139 11/26/2018 1056   NA 142 03/31/2018 1649   K 4.2 11/26/2018 1056   CL 103 11/26/2018 1056   CO2 30 11/26/2018 1056   BUN 14 11/26/2018 1056   BUN 16 03/31/2018 1649   CREATININE 0.98 11/26/2018 1056      Component Value Date/Time   CALCIUM 8.8 (L) 11/26/2018 1056   ALKPHOS 44 11/26/2018 1056   AST 12 (L) 11/26/2018 1056   ALT 17 11/26/2018 1056   BILITOT 0.6 11/26/2018 1056       RADIOGRAPHIC STUDIES: I have personally reviewed the radiological images as listed below and agreed with the findings in the report. Ct Soft Tissue Neck W Contrast  Result Date: 11/24/2018 CLINICAL DATA:  CLL surveillance EXAM: CT NECK WITH CONTRAST TECHNIQUE: Multidetector CT imaging of the neck was performed using the standard protocol following the bolus  administration of intravenous contrast. CONTRAST:  167m OMNIPAQUE IOHEXOL 300 MG/ML  SOLN COMPARISON:  08/11/2018 FINDINGS: Pharynx and larynx: No thickening of Waldeyer's ring. Salivary glands: No inflammation, mass, or stone. Thyroid: Normal. Lymph nodes: No enlarged or enlarging cervical lymph nodes. Enlarged lymph node at the thoracic inlet right of the trachea measuring 13 mm short axis is unchanged from comparison. Right supraclavicular adenopathy with the more lateral node on series 2, image 98 measuring 10 mm in short  axis compared to 13 mm previously. Vascular: Negative Limited intracranial: Negative Visualized orbits: Negative Mastoids and visualized paranasal sinuses: Clear Skeleton: No acute or aggressive finding. Notable degenerative changes in the cervical spine for age, especially left foraminal stenosis at C5-6 Upper chest: Negative IMPRESSION: 1. Compared to 08/11/2018, no enlarging lymph nodes in the neck. 2. Residual right supraclavicular and thoracic inlet adenopathy with mild improvement. Electronically Signed   By: Monte Fantasia M.D.   On: 11/24/2018 08:10   Ct Chest W Contrast  Result Date: 11/24/2018 CLINICAL DATA:  CLL, evaluate lymphadenopathy EXAM: CT CHEST, ABDOMEN, AND PELVIS WITH CONTRAST TECHNIQUE: Multidetector CT imaging of the chest, abdomen and pelvis was performed following the standard protocol during bolus administration of intravenous contrast. CONTRAST:  126m OMNIPAQUE IOHEXOL 300 MG/ML SOLN, additional oral enteric contrast COMPARISON:  08/11/2018, 04/07/2018 FINDINGS: CT CHEST FINDINGS Cardiovascular: No significant vascular findings. Normal heart size. No pericardial effusion. Mediastinum/Nodes: Slight interval decrease in size of bilateral axillary lymph nodes, largest in the right axilla measuring 9 mm, previously 11 mm (series 2, image 12). Bulky mediastinal and right hilar lymph nodes are slightly decreased in size, the largest right hilar node or conglomerate  measuring 3.8 x 2.9 cm, previously 4.3 x 3.3 cm when measured similarly (series 2, image 23). Thyroid gland, trachea, and esophagus demonstrate no significant findings. Lungs/Pleura: Right upper lobe pulmonary nodule has decreased in size slightly, measuring 5 mm, previously 6 mm (series 4, image 55). Decreased size and conspicuity of an atoll ground-glass opacity in the left lower lobe (series 4, image 82). No change in a 5 mm pulmonary nodule of the left lower lobe (series 4, image 100). No pleural effusion or pneumothorax. Musculoskeletal: No chest wall mass or suspicious bone lesions identified. CT ABDOMEN PELVIS FINDINGS Hepatobiliary: No solid liver abnormality is seen. No gallstones, gallbladder wall thickening, or biliary dilatation. Pancreas: Unremarkable. No pancreatic ductal dilatation or surrounding inflammatory changes. Spleen: The spleen is now at the upper limit of normal in size, maximum coronal span 13.2, previously 15.2 cm. Adrenals/Urinary Tract: Adrenal glands are unremarkable. Kidneys are normal, without renal calculi, solid lesion, or hydronephrosis. Bladder is unremarkable. Stomach/Bowel: Stomach is within normal limits. Appendix appears normal. No evidence of bowel wall thickening, distention, or inflammatory changes. Vascular/Lymphatic: No significant vascular findings are present. No pathologically enlarged lymph nodes in the abdomen or pelvis. Prominent subcentimeter retroperitoneal, iliac, and inguinal lymph nodes are not significantly changed, previously with bulky lymphadenopathy on examination dated 04/07/2018. Reproductive: No mass or other abnormality. Other: No abdominal wall hernia or abnormality. No abdominopelvic ascites. Musculoskeletal: No acute or significant osseous findings. IMPRESSION: 1. Overall findings are consistent with treatment response. Numerous enlarged or previously enlarged lymph nodes are stable to slightly decreased in size compared to immediate prior  examination dated 08/11/2018 and markedly decreased in size compared to examination dated 04/07/2018. Small pulmonary nodules are stable or decreased in size. Near complete resolution of splenomegaly. 2. Slight interval decrease in size of bilateral axillary lymph nodes, largest in the right axilla measuring 9 mm, previously 11 mm (series 2, image 12). Bulky mediastinal and right hilar lymph nodes are slightly decreased in size, the largest right hilar node or conglomerate measuring 3.8 x 2.9 cm, previously 4.3 x 3.3 cm when measured similarly (series 2, image 23). 3. Right upper lobe pulmonary nodule has decreased in size slightly, measuring 5 mm, previously 6 mm (series 4, image 55). Decreased size and conspicuity of an atoll ground-glass opacity in the left lower lobe (  series 4, image 82). No change in a 5 mm pulmonary nodule of the left lower lobe (series 4, image 100). 4. No pathologically enlarged lymph nodes in the abdomen or pelvis. Prominent subcentimeter retroperitoneal, iliac, and inguinal lymph nodes are not significantly changed, previously with bulky lymphadenopathy on examination dated 04/07/2018. 5. The spleen is now at the upper limit of normal in size, maximum coronal span 13.2, previously 15.2 cm. Electronically Signed   By: Eddie Candle M.D.   On: 11/24/2018 09:34   Ct Abdomen Pelvis W Contrast  Result Date: 11/24/2018 CLINICAL DATA:  CLL, evaluate lymphadenopathy EXAM: CT CHEST, ABDOMEN, AND PELVIS WITH CONTRAST TECHNIQUE: Multidetector CT imaging of the chest, abdomen and pelvis was performed following the standard protocol during bolus administration of intravenous contrast. CONTRAST:  168m OMNIPAQUE IOHEXOL 300 MG/ML SOLN, additional oral enteric contrast COMPARISON:  08/11/2018, 04/07/2018 FINDINGS: CT CHEST FINDINGS Cardiovascular: No significant vascular findings. Normal heart size. No pericardial effusion. Mediastinum/Nodes: Slight interval decrease in size of bilateral axillary  lymph nodes, largest in the right axilla measuring 9 mm, previously 11 mm (series 2, image 12). Bulky mediastinal and right hilar lymph nodes are slightly decreased in size, the largest right hilar node or conglomerate measuring 3.8 x 2.9 cm, previously 4.3 x 3.3 cm when measured similarly (series 2, image 23). Thyroid gland, trachea, and esophagus demonstrate no significant findings. Lungs/Pleura: Right upper lobe pulmonary nodule has decreased in size slightly, measuring 5 mm, previously 6 mm (series 4, image 55). Decreased size and conspicuity of an atoll ground-glass opacity in the left lower lobe (series 4, image 82). No change in a 5 mm pulmonary nodule of the left lower lobe (series 4, image 100). No pleural effusion or pneumothorax. Musculoskeletal: No chest wall mass or suspicious bone lesions identified. CT ABDOMEN PELVIS FINDINGS Hepatobiliary: No solid liver abnormality is seen. No gallstones, gallbladder wall thickening, or biliary dilatation. Pancreas: Unremarkable. No pancreatic ductal dilatation or surrounding inflammatory changes. Spleen: The spleen is now at the upper limit of normal in size, maximum coronal span 13.2, previously 15.2 cm. Adrenals/Urinary Tract: Adrenal glands are unremarkable. Kidneys are normal, without renal calculi, solid lesion, or hydronephrosis. Bladder is unremarkable. Stomach/Bowel: Stomach is within normal limits. Appendix appears normal. No evidence of bowel wall thickening, distention, or inflammatory changes. Vascular/Lymphatic: No significant vascular findings are present. No pathologically enlarged lymph nodes in the abdomen or pelvis. Prominent subcentimeter retroperitoneal, iliac, and inguinal lymph nodes are not significantly changed, previously with bulky lymphadenopathy on examination dated 04/07/2018. Reproductive: No mass or other abnormality. Other: No abdominal wall hernia or abnormality. No abdominopelvic ascites. Musculoskeletal: No acute or significant  osseous findings. IMPRESSION: 1. Overall findings are consistent with treatment response. Numerous enlarged or previously enlarged lymph nodes are stable to slightly decreased in size compared to immediate prior examination dated 08/11/2018 and markedly decreased in size compared to examination dated 04/07/2018. Small pulmonary nodules are stable or decreased in size. Near complete resolution of splenomegaly. 2. Slight interval decrease in size of bilateral axillary lymph nodes, largest in the right axilla measuring 9 mm, previously 11 mm (series 2, image 12). Bulky mediastinal and right hilar lymph nodes are slightly decreased in size, the largest right hilar node or conglomerate measuring 3.8 x 2.9 cm, previously 4.3 x 3.3 cm when measured similarly (series 2, image 23). 3. Right upper lobe pulmonary nodule has decreased in size slightly, measuring 5 mm, previously 6 mm (series 4, image 55). Decreased size and conspicuity of an atoll ground-glass  opacity in the left lower lobe (series 4, image 82). No change in a 5 mm pulmonary nodule of the left lower lobe (series 4, image 100). 4. No pathologically enlarged lymph nodes in the abdomen or pelvis. Prominent subcentimeter retroperitoneal, iliac, and inguinal lymph nodes are not significantly changed, previously with bulky lymphadenopathy on examination dated 04/07/2018. 5. The spleen is now at the upper limit of normal in size, maximum coronal span 13.2, previously 15.2 cm. Electronically Signed   By: Eddie Candle M.D.   On: 11/24/2018 09:34

## 2018-11-29 LAB — LACTATE DEHYDROGENASE: LDH: 245 U/L — ABNORMAL HIGH (ref 98–192)

## 2018-12-01 MED FILL — IMBRUVICA 140 MG CAPSULE: 140 | 30 days supply | Qty: 90 | Fill #0

## 2018-12-15 ENCOUNTER — Encounter: Payer: Self-pay | Admitting: *Deleted

## 2018-12-15 ENCOUNTER — Other Ambulatory Visit: Payer: Self-pay | Admitting: *Deleted

## 2018-12-15 ENCOUNTER — Other Ambulatory Visit: Payer: Self-pay | Admitting: Hematology

## 2018-12-15 DIAGNOSIS — D649 Anemia, unspecified: Secondary | ICD-10-CM

## 2018-12-15 DIAGNOSIS — R718 Other abnormality of red blood cells: Secondary | ICD-10-CM

## 2018-12-15 DIAGNOSIS — C9110 Chronic lymphocytic leukemia of B-cell type not having achieved remission: Secondary | ICD-10-CM

## 2018-12-15 NOTE — Progress Notes (Signed)
Please see My Chart communication. Referral sent. Will follow up later this week.

## 2018-12-27 ENCOUNTER — Encounter: Payer: Self-pay | Admitting: Gastroenterology

## 2018-12-28 ENCOUNTER — Inpatient Hospital Stay (HOSPITAL_BASED_OUTPATIENT_CLINIC_OR_DEPARTMENT_OTHER): Payer: BC Managed Care – PPO | Admitting: Hematology

## 2018-12-28 ENCOUNTER — Inpatient Hospital Stay: Payer: BC Managed Care – PPO | Attending: Hematology

## 2018-12-28 ENCOUNTER — Telehealth: Payer: Self-pay | Admitting: Hematology

## 2018-12-28 ENCOUNTER — Encounter: Payer: Self-pay | Admitting: Hematology

## 2018-12-28 ENCOUNTER — Encounter: Payer: Self-pay | Admitting: *Deleted

## 2018-12-28 ENCOUNTER — Other Ambulatory Visit: Payer: Self-pay

## 2018-12-28 VITALS — BP 134/72 | HR 84 | Temp 97.5°F | Resp 18

## 2018-12-28 DIAGNOSIS — R5383 Other fatigue: Secondary | ICD-10-CM | POA: Diagnosis not present

## 2018-12-28 DIAGNOSIS — Z719 Counseling, unspecified: Secondary | ICD-10-CM

## 2018-12-28 DIAGNOSIS — Z79899 Other long term (current) drug therapy: Secondary | ICD-10-CM | POA: Insufficient documentation

## 2018-12-28 DIAGNOSIS — R195 Other fecal abnormalities: Secondary | ICD-10-CM

## 2018-12-28 DIAGNOSIS — D72820 Lymphocytosis (symptomatic): Secondary | ICD-10-CM | POA: Diagnosis not present

## 2018-12-28 DIAGNOSIS — C9110 Chronic lymphocytic leukemia of B-cell type not having achieved remission: Secondary | ICD-10-CM | POA: Insufficient documentation

## 2018-12-28 DIAGNOSIS — R197 Diarrhea, unspecified: Secondary | ICD-10-CM | POA: Insufficient documentation

## 2018-12-28 DIAGNOSIS — R161 Splenomegaly, not elsewhere classified: Secondary | ICD-10-CM | POA: Diagnosis not present

## 2018-12-28 LAB — CBC WITH DIFFERENTIAL (CANCER CENTER ONLY)
Abs Immature Granulocytes: 0.08 10*3/uL — ABNORMAL HIGH (ref 0.00–0.07)
Basophils Absolute: 0.2 10*3/uL — ABNORMAL HIGH (ref 0.0–0.1)
Basophils Relative: 1 %
Eosinophils Absolute: 0.3 10*3/uL (ref 0.0–0.5)
Eosinophils Relative: 1 %
HCT: 48.3 % (ref 39.0–52.0)
Hemoglobin: 15.5 g/dL (ref 13.0–17.0)
Immature Granulocytes: 0 %
Lymphocytes Relative: 84 %
Lymphs Abs: 29.5 10*3/uL — ABNORMAL HIGH (ref 0.7–4.0)
MCH: 25.8 pg — ABNORMAL LOW (ref 26.0–34.0)
MCHC: 32.1 g/dL (ref 30.0–36.0)
MCV: 80.5 fL (ref 80.0–100.0)
Monocytes Absolute: 0.6 10*3/uL (ref 0.1–1.0)
Monocytes Relative: 2 %
Neutro Abs: 4.2 10*3/uL (ref 1.7–7.7)
Neutrophils Relative %: 12 %
Platelet Count: 254 10*3/uL (ref 150–400)
RBC: 6 MIL/uL — ABNORMAL HIGH (ref 4.22–5.81)
RDW: 15.5 % (ref 11.5–15.5)
WBC Count: 34.9 10*3/uL — ABNORMAL HIGH (ref 4.0–10.5)
nRBC: 0 % (ref 0.0–0.2)

## 2018-12-28 LAB — CMP (CANCER CENTER ONLY)
ALT: 17 U/L (ref 0–44)
AST: 10 U/L — ABNORMAL LOW (ref 15–41)
Albumin: 4.5 g/dL (ref 3.5–5.0)
Alkaline Phosphatase: 40 U/L (ref 38–126)
Anion gap: 7 (ref 5–15)
BUN: 11 mg/dL (ref 6–20)
CO2: 29 mmol/L (ref 22–32)
Calcium: 9.2 mg/dL (ref 8.9–10.3)
Chloride: 104 mmol/L (ref 98–111)
Creatinine: 1.09 mg/dL (ref 0.61–1.24)
GFR, Est AFR Am: >60 mL/min (ref >60)
GFR, Estimated: >60 mL/min (ref >60)
Glucose, Bld: 101 mg/dL — ABNORMAL HIGH (ref 70–99)
Potassium: 4.2 mmol/L (ref 3.5–5.1)
Sodium: 140 mmol/L (ref 135–145)
Total Bilirubin: 0.6 mg/dL (ref 0.3–1.2)
Total Protein: 5.9 g/dL — ABNORMAL LOW (ref 6.5–8.1)

## 2018-12-28 LAB — SAVE SMEAR(SSMR), FOR PROVIDER SLIDE REVIEW

## 2018-12-28 LAB — LACTATE DEHYDROGENASE: LDH: 157 U/L (ref 98–192)

## 2018-12-28 NOTE — Progress Notes (Signed)
[place] OFFICE PROGRESS NOTE  Patient Care Team: [name], MD as PCP - General (Family Medicine) Cordelia Poche, RN as Oncology Nurse Navigator [name], MD as Medical Oncologist (Hematology) Danie Binder, MD as Consulting Physician (Gastroenterology)  HEME/ONC OVERVIEW: 1. CLL with bulky lymphadenopathy; RAI Stage IV, intermediate risk; p53 deletion- -03/2018:   PB flow showed CD5+, CD23+ monoclonal B-cells, c/w CLL; IgHV mutation negative  FISH (PB) positive for WUJ(81X91); negative t(11;14), p53 deletion/amplification, and ATM (11q22.3) deletion  CT neck, CAP showed bulky lymphadenopathy in bilateral cervical LN's and throughout the chest, abdomen and pelvis; marked splenomegaly -04/2018: R axillary excision LN biopsy showed CLL/SLL (cyclin D1 neg) -05/2017 - present: ibrutinib   11/2018: CT CAP showed near normalization of all LN's except R hilar LN ~3.8cm (improving)  TREATMENT REGIMEN:  05/14/2018 - present: ibrutinib 428m daily  ASSESSMENT & PLAN:  CLL with diffuse lymphadenopathy; RAI Stage IV, intermediate risk   -He is tolerating ibrutinib relatively well -Continue ibrutinib 4267mdaily  -We will plan for periodic image monitoring, next in ~6 months (ie 05/2019), unless he develops clinically suspicious symptoms for disease progression  -Prophylaxis: acyclovir, atovaquone   Leukocytosis -Secondary to CLL -Overall improving, WBC 34.9k today  -Patient denies any symptoms of infection -We will continue to monitor it for now  Fatigue -Likely secondary to ibrutinib -Grade 1, not affecting ADL's -I have ordered TSH to monitor thyroid function   Loose stools -Likely secondary to ibrutinib -No abdominal pain, diarrhea, hematochezia or melena  -I counseled the patient on dietary modifications -We will monitor it for now  Health counseling -GI appt scheduled on 01/20/2019 -Given the patient's extensive family hx of colon cancer, I  strongly encouraged him to keep the appt for age-appropriate cancer screening, including colonoscopy -Patient expressed understanding and agreed to the plan   Orders Placed This Encounter  Procedures  . TSH    Standing Status:   Future    Number of Occurrences:   1    Standing Expiration Date:   12/28/2019  . CBC with Differential (Cancer Center Only)    Standing Status:   Standing    Number of Occurrences:   20    Standing Expiration Date:   12/28/2019  . CMP ([facility])    Standing Status:   Standing    Number of Occurrences:   20    Standing Expiration Date:   12/28/2019  . Save Smear ([facility])    Standing Status:   Standing    Number of Occurrences:   20    Standing Expiration Date:   12/28/2019  . Lactate dehydrogenase    Standing Status:   Standing    Number of Occurrences:   20    Standing Expiration Date:   12/28/2019   All questions were answered. The patient knows to call the clinic with any problems, questions or concerns. No barriers to learning was detected.  Return in 1 month for labs only. Return in 2 months for labs and clinic follow-up.   [name] 12/28/2018 12:05 PM  CHIEF COMPLAINT: "I am doing fine"  INTERVAL HISTORY: Mr. MaDimaioeturns to clinic for follow-up of CLL on ibrutinib.  Patient reports that he is tolerating treatment relatively well, except periodic fatigue, which seem to be more pronounced over the past 2 to 3 weeks, but it is not limiting his ability to perform ADLs.  Overall, the intensity of fatigue fluctuates.  He also reports some  loose stools during the day after taking improvement, but denies any abdominal pain, nausea, vomiting, diarrhea, hematochezia, or melena.  He denies any other complaint today.  SUMMARY OF ONCOLOGIC HISTORY: Oncology History  CLL (chronic lymphocytic leukemia) ([place])  04/01/2018 Initial Diagnosis   CLL (chronic lymphocytic leukemia) ([place])   04/05/2018 Pathology Results   Peripheral Blood Flow  Cytometry - MONOCLONAL B-CELL POPULATION IDENTIFIED. - SEE NOTE.   04/05/2018 Pathology Results   PB FISH: No evidence of CCND1-IGH [t(11;14)] gene rearrangement. No evidence of trisomy 11 or gain of 11q. (excludes mantle cell lymphoma) No evidence of trisomy 12.  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 No evidence of p53 (17p13( deletion or amplification. No evidence of ATM (11q22.3) deletion.    04/07/2018 Imaging   CT neck w/ contrast: IMPRESSION: Bulky bilateral cervical lymphadenopathy consistent with lymphoproliferative disease/lymphoma.   04/07/2018 Imaging   CT CAP w/ contrast: IMPRESSION: 1. Bulky lymphadenopathy throughout the chest, abdomen and pelvis, as detailed above, highly suspicious for lymphoma. 2. Marked splenomegaly. 3. 9 mm pulmonary nodule within the anterior inferior portion of the RIGHT upper lobe. Subtle low-density nodule within the LEFT lower lobe. These are too small to definitively characterize. Neoplastic pulmonary nodules cannot be excluded. Consider PET-CT for further characterization.   04/16/2018 Surgery   Right axillary LN excisional biopsy   04/16/2018 Pathology Results   (Accession: [id number]) Lymph node for lymphoma, Right Axillary - CHRONIC LYMPHOCYTIC LEUKEMIA/SMALL LYMPHOCYTIC LYMPHOMA.     REVIEW OF SYSTEMS:   Constitutional: ( - ) fevers, ( - )  chills , ( - ) night sweats Eyes: ( - ) blurriness of vision, ( - ) double vision, ( - ) watery eyes Ears, nose, mouth, throat, and face: ( - ) mucositis, ( - ) sore throat Respiratory: ( - ) cough, ( - ) dyspnea, ( - ) wheezes Cardiovascular: ( - ) palpitation, ( - ) chest discomfort, ( + ) lower extremity swelling Gastrointestinal:  ( - ) nausea, ( - ) heartburn, ( - ) change in bowel habits Skin: ( - ) abnormal skin rashes Lymphatics: ( - ) new lymphadenopathy, ( - ) easy bruising Neurological: ( - ) numbness, ( - ) tingling, ( - ) new weaknesses Behavioral/Psych:  ( - ) mood change, ( - ) new changes  All other systems were reviewed with the patient and are negative.  I have reviewed the past medical history, past surgical history, social history and family history with the patient and they are unchanged from previous note.  ALLERGIES:  is allergic to bactrim [sulfamethoxazole-trimethoprim] and amoxicillin.  MEDICATIONS:  Current Outpatient Medications  Medication Sig Dispense Refill  . acyclovir (ZOVIRAX) 400 MG tablet Take 400 mg by mouth 2 (two) times daily.     Marland Kitchen atovaquone (MEPRON) 750 MG/5ML suspension TAKE 10 MLS (1500 MG TOTAL) BY MOUTH DAILY FOR 30 DAYS    . fluticasone (CUTIVATE) 0.05 % cream Apply topically 2 (two) times daily. 30 g 0  . hydrOXYzine (ATARAX/VISTARIL) 10 MG tablet Take 10 mg by mouth 2 (two) times daily as needed.    . ibrutinib (IMBRUVICA) 140 MG capsul Take 3 capsules (420 mg total) by mouth daily. 90 capsule 5   No current facility-administered medications for this visit.     PHYSICAL EXAMINATION: ECOG PERFORMANCE STATUS: 1 - Symptomatic but completely ambulatory  Today's Vitals   12/28/18 1147  BP: 134/72  Pulse: 84  Resp: 18  Temp: (!) 97.5 F (36.4 C)  TempSrc:  Oral   There is no height or weight on file to calculate BMI.  Filed Weights    GENERAL: alert, no distress and comfortable SKIN: skin color, texture, turgor are normal, no rashes or significant lesions EYES: conjunctiva are pink and non-injected, sclera clear NECK: supple, non-tender LYMPH:  subcentimeter shotty cervical LN's, no pathologically enlarged LN's  LUNGS: clear to auscultation with normal breathing effort HEART: regular rate & rhythm and no murmurs and no lower extremity edema ABDOMEN: soft, non-tender, non-distended, normal bowel sounds Musculoskeletal: no cyanosis of digits and no clubbing  PSYCH: alert & oriented x 3, fluent speech NEURO: no focal motor/sensory deficits  LABORATORY DATA:  I have reviewed the data as listed     Component Value Date/Time   NA 140 12/28/2018 1125   NA 142 03/31/2018 1649   K 4.2 12/28/2018 1125   CL 104 12/28/2018 1125   CO2 29 12/28/2018 1125   GLUCOSE 101 (H) 12/28/2018 1125   BUN 11 12/28/2018 1125   BUN 16 03/31/2018 1649   CREATININE 1.09 12/28/2018 1125   CALCIUM 9.2 12/28/2018 1125   PROT 5.9 (L) 12/28/2018 1125   PROT 5.9 (L) 03/31/2018 1649   ALBUMIN 4.5 12/28/2018 1125   ALBUMIN 4.6 03/31/2018 1649   AST 10 (L) 12/28/2018 1125   ALT 17 12/28/2018 1125   ALKPHOS 40 12/28/2018 1125   BILITOT 0.6 12/28/2018 1125   GFRNONAA >60 12/28/2018 1125   GFRAA >60 12/28/2018 1125    No results found for: SPEP, UPEP  Lab Results  Component Value Date   WBC 34.9 (H) 12/28/2018   NEUTROABS 4.2 12/28/2018   HGB 15.5 12/28/2018   HCT 48.3 12/28/2018   MCV 80.5 12/28/2018   PLT 254 12/28/2018      Chemistry      Component Value Date/Time   NA 140 12/28/2018 1125   NA 142 03/31/2018 1649   K 4.2 12/28/2018 1125   CL 104 12/28/2018 1125   CO2 29 12/28/2018 1125   BUN 11 12/28/2018 1125   BUN 16 03/31/2018 1649   CREATININE 1.09 12/28/2018 1125      Component Value Date/Time   CALCIUM 9.2 12/28/2018 1125   ALKPHOS 40 12/28/2018 1125   AST 10 (L) 12/28/2018 1125   ALT 17 12/28/2018 1125   BILITOT 0.6 12/28/2018 1125

## 2018-12-28 NOTE — Telephone Encounter (Signed)
Called but unable to reach patient by phone. Letter/calendar has been mailed per 8/18 los

## 2018-12-29 LAB — TSH: TSH: 1.752 u[IU]/mL (ref 0.320–4.118)

## 2019-01-05 MED FILL — IMBRUVICA 140 MG CAPSULE: 140 | 30 days supply | Qty: 90 | Fill #1

## 2019-01-20 ENCOUNTER — Encounter: Payer: Self-pay | Admitting: *Deleted

## 2019-01-20 ENCOUNTER — Encounter: Payer: Self-pay | Admitting: Gastroenterology

## 2019-01-20 ENCOUNTER — Other Ambulatory Visit: Payer: Self-pay | Admitting: *Deleted

## 2019-01-20 ENCOUNTER — Other Ambulatory Visit: Payer: Self-pay

## 2019-01-20 ENCOUNTER — Encounter: Payer: Self-pay | Admitting: Nurse Practitioner

## 2019-01-20 ENCOUNTER — Ambulatory Visit (INDEPENDENT_AMBULATORY_CARE_PROVIDER_SITE_OTHER): Payer: BC Managed Care – PPO | Admitting: Nurse Practitioner

## 2019-01-20 DIAGNOSIS — E611 Iron deficiency: Secondary | ICD-10-CM | POA: Diagnosis not present

## 2019-01-20 MED ORDER — PEG 3350-KCL-NA BICARB-NACL 420 G PO SOLR: 4000.0000 mL | Freq: Once | ORAL | 0 refills | Status: AC

## 2019-01-20 NOTE — Patient Instructions (Signed)
Your health issues we discussed today were:   Iron deficiency and family history of colon cancer: 1. We will proceed with a colonoscopy and an upper endoscopy 2. Further recommendations will be made after your procedures 3. Let us know if you see any obvious bleeding or pitch black/tarry stools  Overall I recommend:  1. Continue your current medications 2. Continue to follow-up with oncology 3. Follow-up in our office in 4 months 4. Call us if you have any questions or concerns.   Because of recent events of COVID-19 ("Coronavirus"), follow CDC recommendations:  Wash your hand frequently Avoid touching your face Stay away from people who are sick If you have symptoms such as fever, cough, shortness of breath then call your healthcare provider for further guidance If you are sick, STAY AT HOME unless otherwise directed by your healthcare provider. Follow directions from state and national officials regarding staying safe   At [facility] we value your feedback. You may receive a survey about your visit today. Please share your experience as we strive to create trusting relationships with our patients to provide genuine, compassionate, quality care.  We appreciate your understanding and patience as we review any laboratory studies, imaging, and other diagnostic tests that are ordered as we care for you. Our office policy is 5 business days for review of these results, and any emergent or urgent results are addressed in a timely manner for your best interest. If you do not hear from our office in 1 week, please contact us.   We also encourage the use of MyChart, which contains your medical information for your review as well. If you are not enrolled in this feature, an access code is on this after visit summary for your convenience. Thank you for allowing [place] to be involved in your care.  It was great to see you today!  I hope you have a great Fall!!

## 2019-01-20 NOTE — Assessment & Plan Note (Signed)
The patient has documented iron deficiency.  Technically his hemoglobin is normal, although he does have CLL and is on chemotherapy and other supplements such as kind of Mondays to picture a bit.  He does have a significant family history of colon cancer in his father and his paternal uncle both of which succumbed to the disease.  No obvious hematochezia or melena.  Given his iron deficiency, and on the recommendations of oncology, we will proceed with a colonoscopy and upper endoscopy to further evaluate.  Return for follow-up in 4 months.  Proceed with colonoscopy and EGD with Dr. Oneida Alar in the near future. The risks, benefits, and alternatives have been discussed in detail with the patient. They state understanding and desire to proceed.   The patient is not on any anticoagulants, anxiolytics, chronic pain medications, or antidepressants.  Conscious sedation should be adequate for his procedure.

## 2019-01-20 NOTE — Progress Notes (Addendum)
REVIEWED-NO ADDITIONAL RECOMMENDATIONS.  Primary Care Physician:  [name], MD Primary Gastroenterologist:  Dr. Oneida Alar  Chief Complaint  Patient presents with  . Anemia    HPI:   David Whitehead is a 48 y.o. male who presents on referral from oncology for anemia.  Reviewed information provided with referral including oncology office visit dated 03/01/2019.  At that time she is being treated for CLL with lymphadenopathy.  Currently on treatment.  At that time noted iron profile consistent with iron deficiency and the patient denies any symptoms of GI bleed but has a significant family history of colon cancer (father died in his 18s).  Recommended referral to GI for evaluation.  Reviewed recent labs and ferritin on 10/22/2018 was low at 10, iron panel on the same day found low iron at 37, low saturation at 9%, elevated TIBC at 424.  Interestingly, his hemoglobin has been normal and was 15.5 three weeks ago but generally in the low 13 to low 14 range.  There was one CBC 3 months ago that had microcytosis with an MCV of 79.4.  All CBCs have been hypochromic ranging from 24-25.  No history of colonoscopy or endoscopy in our system.  Today he states he's doing ok overall. Has never had a colonoscopy or EGD. Started seeing oncology November 2019. Currently on a chemotherapy pill which seems to be effective. Is on prenatal vitamins. Denies abdominal pain, N/V, fever, chills, unintentional weight loss, hematochezia, melena. Denies URI or flu-like symptoms. Denies loss of sense of taste or smell. Denies chest pain, dyspnea, dizziness, lightheadedness, syncope, near syncope. Denies any other upper or lower GI symptoms.  Family history of colon CA (father 69, paternal uncle late 35s)  Past Medical History:  Diagnosis Date  . Cancer ([place])   . CLL (chronic lymphocytic leukemia) ([place])   . Leukocytosis 04/01/2018  . Sleep apnea    uses a cpap    Past Surgical History:  Procedure Laterality  Date  . AXILLARY LYMPH NODE DISSECTION Right 04/16/2018   Procedure: AXILLARY LYMPH NODE DEEP EXCISION;  Surgeon: Fanny Skates, MD;  Location: [facility];  Service: General;  Laterality: Right;  . FINGER SURGERY      Current Outpatient Medications  Medication Sig Dispense Refill  . acyclovir (ZOVIRAX) 400 MG tablet Take 400 mg by mouth 2 (two) times daily.     Marland Kitchen atovaquone (MEPRON) 750 MG/5ML suspension TAKE 10 MLS (1500 MG TOTAL) BY MOUTH DAILY FOR 30 DAYS    . hydrOXYzine (ATARAX/VISTARIL) 10 MG tablet Take 10 mg by mouth 2 (two) times daily as needed.    . Ibrutinib (IMBRUVICA) 420 MG TABS Take by mouth daily.     No current facility-administered medications for this visit.     Allergies as of 01/20/2019 - Review Complete 01/20/2019  Allergen Reaction Noted  . Bactrim [sulfamethoxazole-trimethoprim] Rash 05/28/2018  . Latex Other (See Comments) 01/20/2019  . Amoxicillin Rash 04/05/2018    Family History  Problem Relation Age of Onset  . Colon cancer Father 38       passed from [place]  . Colon cancer Paternal Uncle 62       passed from [facility]    Social History   Socioeconomic History  . Marital status: Married    Spouse name: [name]  . Number of children: Not on file  . Years of education: Not on file  . Highest education level: Not on file  Occupational History  . Not on file  Social Needs  .  Financial resource strain: Not on file  . Food insecurity    Worry: Not on file    Inability: Not on file  . Transportation needs    Medical: Not on file    Non-medical: Not on file  Tobacco Use  . Smoking status: Never Smoker  . Smokeless tobacco: Never Used  Substance and Sexual Activity  . Alcohol use: Not Currently  . Drug use: Never  . Sexual activity: Yes  Lifestyle  . Physical activity    Days per week: Not on file    Minutes per session: Not on file  . Stress: Not on file  Relationships  . Social Herbalist on phone: Not on file    Gets together: Not on  file    Attends religious service: Not on file    Active member of club or organization: Not on file    Attends meetings of clubs or organizations: Not on file    Relationship status: Not on file  . Intimate partner violence    Fear of current or ex partner: Not on file    Emotionally abused: Not on file    Physically abused: Not on file    Forced sexual activity: Not on file  Other Topics Concern  . Not on file  Social History Narrative  . Not on file    Review of Systems: Complete ROS negative except as per HPI.    Physical Exam: BP (!) 146/89   Pulse 81   Temp (!) 97.1 F (36.2 C) (Temporal)   Ht 5\' 9"  (1.753 m)   Wt 214 lb 12.8 oz (97.4 kg)   BMI 31.72 kg/m  General:   Alert and oriented. Pleasant and cooperative. Well-nourished and well-developed.  Head:  Normocephalic and atraumatic. Eyes:  Without icterus, sclera clear and conjunctiva pink.  Ears:  Normal auditory acuity. Cardiovascular:  S1, S2 present without murmurs appreciated. Extremities without clubbing or edema. Respiratory:  Clear to auscultation bilaterally. No wheezes, rales, or rhonchi. No distress.  Gastrointestinal:  +BS, soft, non-tender and non-distended. No HSM noted. No guarding or rebound. No masses appreciated.  Rectal:  Deferred  Musculoskalatal:  Symmetrical without gross deformities.  Neurologic:  Alert and oriented x4;  grossly normal neurologically. Psych:  Alert and cooperative. Normal mood and affect. Heme/Lymph/Immune: No excessive bruising noted.    01/20/2019 8:59 AM   Disclaimer: This note was dictated with voice recognition software. Similar sounding words can inadvertently be transcribed and may not be corrected upon review.

## 2019-01-24 ENCOUNTER — Telehealth: Payer: Self-pay | Admitting: Family Medicine

## 2019-01-24 NOTE — Telephone Encounter (Signed)
I spoke with patient and his wife this evening. Patient has been having sinus pain for 2 weeks.  He is also been having neck problems for which he was seen chiropractor.  His wife relates that the patient gets up at nighttime because his head hurts.  When the patient was questioned he stated it was his sinuses hurting not so much a headache.  2 days ago he noticed a brief spell where he was not able to see centrally well.  Then he states today he noticed that distal vision appears double.  He states that this goes away when he covers up 1 eye.  He denies having any severe headache currently.  No nausea or vomiting.  His wife states that she checked his blood pressure and it was elevated.  I highly encourage the patient to call his oncologist in the morning.  I believe that this onset of head pain as well as double vision in the setting of his illness will require further evaluation possibility of a scan of his head.  As for the blood pressure if it remains elevated we will be happy to see him for this.  Also to if his oncologist chooses to handle this issue then that would be fine as well.  I did tell the patient we will be happy to assist as much as possible but we will send a copy of this message to the oncologist so hopefully they will see him ASAP

## 2019-01-25 ENCOUNTER — Other Ambulatory Visit: Payer: Self-pay | Admitting: Family Medicine

## 2019-01-25 ENCOUNTER — Encounter: Payer: Self-pay | Admitting: Hematology

## 2019-01-25 ENCOUNTER — Telehealth: Payer: Self-pay | Admitting: *Deleted

## 2019-01-25 ENCOUNTER — Other Ambulatory Visit: Payer: Self-pay | Admitting: Hematology

## 2019-01-25 ENCOUNTER — Telehealth: Payer: Self-pay | Admitting: Family Medicine

## 2019-01-25 ENCOUNTER — Ambulatory Visit (HOSPITAL_BASED_OUTPATIENT_CLINIC_OR_DEPARTMENT_OTHER)
Admission: RE | Admit: 2019-01-25 | Discharge: 2019-01-25 | Disposition: A | Discharge status: Home / Self Care | Payer: BC Managed Care – PPO | Source: Ambulatory Visit | Attending: Hematology | Admitting: Hematology

## 2019-01-25 ENCOUNTER — Ambulatory Visit: Payer: BC Managed Care – PPO | Admitting: Family Medicine

## 2019-01-25 ENCOUNTER — Other Ambulatory Visit: Payer: Self-pay

## 2019-01-25 ENCOUNTER — Encounter: Payer: Self-pay | Admitting: *Deleted

## 2019-01-25 DIAGNOSIS — R51 Headache: Secondary | ICD-10-CM | POA: Diagnosis not present

## 2019-01-25 DIAGNOSIS — R519 Headache, unspecified: Secondary | ICD-10-CM

## 2019-01-25 DIAGNOSIS — H532 Diplopia: Secondary | ICD-10-CM

## 2019-01-25 DIAGNOSIS — J019 Acute sinusitis, unspecified: Secondary | ICD-10-CM | POA: Diagnosis not present

## 2019-01-25 DIAGNOSIS — J329 Chronic sinusitis, unspecified: Secondary | ICD-10-CM | POA: Diagnosis not present

## 2019-01-25 MED ORDER — DOXYCYCLINE HYCLATE 100 MG PO TABS: 100.0000 mg | ORAL_TABLET | Freq: Two times a day (BID) | ORAL | 0 refills | Status: DC

## 2019-01-25 NOTE — Telephone Encounter (Signed)
CT complete

## 2019-01-25 NOTE — Telephone Encounter (Signed)
Front This patient is having headaches for the past 2 weeks sinus related Prescreening for COVID negative Patient also having blood pressure issues He is coming at 3:45 PM today to be seen inside thank you

## 2019-01-25 NOTE — Telephone Encounter (Signed)
I believe the patient just has sinusitis, but we can prescribe abx Augmentin for routine sinusitis.   He should continue BP management by his PCP.  Dr. Maylon Peppers

## 2019-01-25 NOTE — Telephone Encounter (Signed)
Thank you We will follow-up with the patient later today We will take care of treating the sinuses as well as blood pressure and will notify you if double vision issue reoccurs

## 2019-01-25 NOTE — Telephone Encounter (Signed)
No problem. I have sent the patient a message regarding abx (doxycyline), and have ordered CT head and sinus (hopefully will be done today).   If nothing else serious, he can continue to follow up with you for routine health issues.  [place]

## 2019-01-26 ENCOUNTER — Encounter: Payer: Self-pay | Admitting: Family Medicine

## 2019-01-26 ENCOUNTER — Telehealth: Payer: Self-pay | Admitting: Hematology

## 2019-01-26 ENCOUNTER — Ambulatory Visit (INDEPENDENT_AMBULATORY_CARE_PROVIDER_SITE_OTHER): Payer: BC Managed Care – PPO | Admitting: Family Medicine

## 2019-01-26 VITALS — BP 128/90 | Temp 97.6°F | Ht 69.0 in | Wt 212.6 lb

## 2019-01-26 DIAGNOSIS — I10 Essential (primary) hypertension: Secondary | ICD-10-CM | POA: Diagnosis not present

## 2019-01-26 DIAGNOSIS — R519 Headache, unspecified: Secondary | ICD-10-CM

## 2019-01-26 DIAGNOSIS — R51 Headache: Secondary | ICD-10-CM | POA: Diagnosis not present

## 2019-01-26 DIAGNOSIS — H532 Diplopia: Secondary | ICD-10-CM

## 2019-01-26 MED ORDER — HYDROCHLOROTHIAZIDE 25 MG PO TABS: 25.0000 mg | ORAL_TABLET | Freq: Every day | ORAL | 4 refills | Status: DC

## 2019-01-26 MED ORDER — TIZANIDINE HCL 4 MG PO TABS: 4.0000 mg | ORAL_TABLET | Freq: Four times a day (QID) | ORAL | 1 refills | Status: DC | PRN

## 2019-01-26 MED ORDER — POTASSIUM CHLORIDE CRYS ER 20 MEQ PO TBCR: 20.0000 meq | EXTENDED_RELEASE_TABLET | Freq: Every day | ORAL | 3 refills | Status: DC

## 2019-01-26 NOTE — Patient Instructions (Signed)
Hypertension, Adult High blood pressure (hypertension) is when the force of blood pumping through the arteries is too strong. The arteries are the blood vessels that carry blood from the heart throughout the body. Hypertension forces the heart to work harder to pump blood and may cause arteries to become narrow or stiff. Untreated or uncontrolled hypertension can cause a heart attack, heart failure, a stroke, kidney disease, and other problems. A blood pressure reading consists of a higher number over a lower number. Ideally, your blood pressure should be below 120/80. The first ("top") number is called the systolic pressure. It is a measure of the pressure in your arteries as your heart beats. The second ("bottom") number is called the diastolic pressure. It is a measure of the pressure in your arteries as the heart relaxes. What are the causes? The exact cause of this condition is not known. There are some conditions that result in or are related to high blood pressure. What increases the risk? Some risk factors for high blood pressure are under your control. The following factors may make you more likely to develop this condition:  Smoking.  Having type 2 diabetes mellitus, high cholesterol, or both.  Not getting enough exercise or physical activity.  Being overweight.  Having too much fat, sugar, calories, or salt (sodium) in your diet.  Drinking too much alcohol. Some risk factors for high blood pressure may be difficult or impossible to change. Some of these factors include:  Having chronic kidney disease.  Having a family history of high blood pressure.  Age. Risk increases with age.  Race. You may be at higher risk if you are African American.  Gender. Men are at higher risk than women before age 45. After age 65, women are at higher risk than men.  Having obstructive sleep apnea.  Stress. What are the signs or symptoms? High blood pressure may not cause symptoms. Very high  blood pressure (hypertensive crisis) may cause:  Headache.  Anxiety.  Shortness of breath.  Nosebleed.  Nausea and vomiting.  Vision changes.  Severe chest pain.  Seizures. How is this diagnosed? This condition is diagnosed by measuring your blood pressure while you are seated, with your arm resting on a flat surface, your legs uncrossed, and your feet flat on the floor. The cuff of the blood pressure monitor will be placed directly against the skin of your upper arm at the level of your heart. It should be measured at least twice using the same arm. Certain conditions can cause a difference in blood pressure between your right and left arms. Certain factors can cause blood pressure readings to be lower or higher than normal for a short period of time:  When your blood pressure is higher when you are in a health care provider's office than when you are at home, this is called white coat hypertension. Most people with this condition do not need medicines.  When your blood pressure is higher at home than when you are in a health care provider's office, this is called masked hypertension. Most people with this condition may need medicines to control blood pressure. If you have a high blood pressure reading during one visit or you have normal blood pressure with other risk factors, you may be asked to:  Return on a different day to have your blood pressure checked again.  Monitor your blood pressure at home for 1 week or longer. If you are diagnosed with hypertension, you may have other blood or   imaging tests to help your health care provider understand your overall risk for other conditions. How is this treated? This condition is treated by making healthy lifestyle changes, such as eating healthy foods, exercising more, and reducing your alcohol intake. Your health care provider may prescribe medicine if lifestyle changes are not enough to get your blood pressure under control, and  if:  Your systolic blood pressure is above 130.  Your diastolic blood pressure is above 80. Your personal target blood pressure may vary depending on your medical conditions, your age, and other factors. Follow these instructions at home: Eating and drinking   Eat a diet that is high in fiber and potassium, and low in sodium, added sugar, and fat. An example eating plan is called the DASH (Dietary Approaches to Stop Hypertension) diet. To eat this way: ? Eat plenty of fresh fruits and vegetables. Try to fill one half of your plate at each meal with fruits and vegetables. ? Eat whole grains, such as whole-wheat pasta, brown rice, or whole-grain bread. Fill about one fourth of your plate with whole grains. ? Eat or drink low-fat dairy products, such as skim milk or low-fat yogurt. ? Avoid fatty cuts of meat, processed or cured meats, and poultry with skin. Fill about one fourth of your plate with lean proteins, such as fish, chicken without skin, beans, eggs, or tofu. ? Avoid pre-made and processed foods. These tend to be higher in sodium, added sugar, and fat.  Reduce your daily sodium intake. Most people with hypertension should eat less than 1,500 mg of sodium a day.  Do not drink alcohol if: ? Your health care provider tells you not to drink. ? You are pregnant, may be pregnant, or are planning to become pregnant.  If you drink alcohol: ? Limit how much you use to:  0-1 drink a day for women.  0-2 drinks a day for men. ? Be aware of how much alcohol is in your drink. In the [place] one drink equals one 12 oz bottle of beer (355 mL), one 5 oz glass of wine (148 mL), or one 1 oz glass of hard liquor (44 mL). Lifestyle   Work with your health care provider to maintain a healthy body weight or to lose weight. Ask what an ideal weight is for you.  Get at least 30 minutes of exercise most days of the week. Activities may include walking, swimming, or biking.  Include exercise to  strengthen your muscles (resistance exercise), such as Pilates or lifting weights, as part of your weekly exercise routine. Try to do these types of exercises for 30 minutes at least 3 days a week.  Do not use any products that contain nicotine or tobacco, such as cigarettes, e-cigarettes, and chewing tobacco. If you need help quitting, ask your health care provider.  Monitor your blood pressure at home as told by your health care provider.  Keep all follow-up visits as told by your health care provider. This is important. Medicines  Take over-the-counter and prescription medicines only as told by your health care provider. Follow directions carefully. Blood pressure medicines must be taken as prescribed.  Do not skip doses of blood pressure medicine. Doing this puts you at risk for problems and can make the medicine less effective.  Ask your health care provider about side effects or reactions to medicines that you should watch for. Contact a health care provider if you:  Think you are having a reaction to a medicine you   are taking.  Have headaches that keep coming back (recurring).  Feel dizzy.  Have swelling in your ankles.  Have trouble with your vision. Get help right away if you:  Develop a severe headache or confusion.  Have unusual weakness or numbness.  Feel faint.  Have severe pain in your chest or abdomen.  Vomit repeatedly.  Have trouble breathing. Summary  Hypertension is when the force of blood pumping through your arteries is too strong. If this condition is not controlled, it may put you at risk for serious complications.  Your personal target blood pressure may vary depending on your medical conditions, your age, and other factors. For most people, a normal blood pressure is less than 120/80.  Hypertension is treated with lifestyle changes, medicines, or a combination of both. Lifestyle changes include losing weight, eating a healthy, low-sodium diet,  exercising more, and limiting alcohol. This information is not intended to replace advice given to you by your health care provider. Make sure you discuss any questions you have with your health care provider. Document Released: 04/28/2005 Document Revised: 01/06/2018 Document Reviewed: 01/06/2018 Elsevier Patient Education  2020 [employer] DASH stands for "Dietary Approaches to Stop Hypertension." The DASH eating plan is a healthy eating plan that has been shown to reduce high blood pressure (hypertension). It may also reduce your risk for type 2 diabetes, heart disease, and stroke. The DASH eating plan may also help with weight loss. What are tips for following this plan?  General guidelines  Avoid eating more than 2,300 mg (milligrams) of salt (sodium) a day. If you have hypertension, you may need to reduce your sodium intake to 1,500 mg a day.  Limit alcohol intake to no more than 1 drink a day for nonpregnant women and 2 drinks a day for men. One drink equals 12 oz of beer, 5 oz of wine, or 1 oz of hard liquor.  Work with your health care provider to maintain a healthy body weight or to lose weight. Ask what an ideal weight is for you.  Get at least 30 minutes of exercise that causes your heart to beat faster (aerobic exercise) most days of the week. Activities may include walking, swimming, or biking.  Work with your health care provider or diet and nutrition specialist (dietitian) to adjust your eating plan to your individual calorie needs. Reading food labels   Check food labels for the amount of sodium per serving. Choose foods with less than 5 percent of the Daily Value of sodium. Generally, foods with less than 300 mg of sodium per serving fit into this eating plan.  To find whole grains, look for the word "whole" as the first word in the ingredient list. Shopping  Buy products labeled as "low-sodium" or "no salt added."  Buy fresh foods. Avoid canned  foods and premade or frozen meals. Cooking  Avoid adding salt when cooking. Use salt-free seasonings or herbs instead of table salt or sea salt. Check with your health care provider or pharmacist before using salt substitutes.  Do not fry foods. Cook foods using healthy methods such as baking, boiling, grilling, and broiling instead.  Cook with heart-healthy oils, such as olive, canola, soybean, or sunflower oil. Meal planning  Eat a balanced diet that includes: ? 5 or more servings of fruits and vegetables each day. At each meal, try to fill half of your plate with fruits and vegetables. ? Up to 6-8 servings of whole grains each day. ?  Less than 6 oz of lean meat, poultry, or fish each day. A 3-oz serving of meat is about the same size as a deck of cards. One egg equals 1 oz. ? 2 servings of low-fat dairy each day. ? A serving of nuts, seeds, or beans 5 times each week. ? Heart-healthy fats. Healthy fats called Omega-3 fatty acids are found in foods such as flaxseeds and coldwater fish, like sardines, salmon, and mackerel.  Limit how much you eat of the following: ? Canned or prepackaged foods. ? Food that is high in trans fat, such as fried foods. ? Food that is high in saturated fat, such as fatty meat. ? Sweets, desserts, sugary drinks, and other foods with added sugar. ? Full-fat dairy products.  Do not salt foods before eating.  Try to eat at least 2 vegetarian meals each week.  Eat more home-cooked food and less restaurant, buffet, and fast food.  When eating at a restaurant, ask that your food be prepared with less salt or no salt, if possible. What foods are recommended? The items listed may not be a complete list. Talk with your dietitian about what dietary choices are best for you. Grains Whole-grain or whole-wheat bread. Whole-grain or whole-wheat pasta. Brown rice. [place]. Bulgur. Whole-grain and low-sodium cereals. Pita bread. Low-fat, low-sodium crackers.  Whole-wheat flour tortillas. Vegetables Fresh or frozen vegetables (raw, steamed, roasted, or grilled). Low-sodium or reduced-sodium tomato and vegetable juice. Low-sodium or reduced-sodium tomato sauce and tomato paste. Low-sodium or reduced-sodium canned vegetables. Fruits All fresh, dried, or frozen fruit. Canned fruit in natural juice (without added sugar). Meat and other protein foods Skinless chicken or [place]. Ground chicken or [place]. Pork with fat trimmed off. Fish and seafood. Egg whites. Dried beans, peas, or lentils. Unsalted nuts, nut butters, and seeds. Unsalted canned beans. Lean cuts of beef with fat trimmed off. Low-sodium, lean deli meat. Dairy Low-fat (1%) or fat-free (skim) milk. Fat-free, low-fat, or reduced-fat cheeses. Nonfat, low-sodium ricotta or cottage cheese. Low-fat or nonfat yogurt. Low-fat, low-sodium cheese. Fats and oils Soft margarine without trans fats. Vegetable oil. Low-fat, reduced-fat, or light mayonnaise and salad dressings (reduced-sodium). Canola, safflower, olive, soybean, and sunflower oils. Avocado. Seasoning and other foods Herbs. Spices. Seasoning mixes without salt. Unsalted popcorn and pretzels. Fat-free sweets. What foods are not recommended? The items listed may not be a complete list. Talk with your dietitian about what dietary choices are best for you. Grains Baked goods made with fat, such as croissants, muffins, or some breads. Dry pasta or rice meal packs. Vegetables Creamed or fried vegetables. Vegetables in a cheese sauce. Regular canned vegetables (not low-sodium or reduced-sodium). Regular canned tomato sauce and paste (not low-sodium or reduced-sodium). Regular tomato and vegetable juice (not low-sodium or reduced-sodium). Angie Fava. Olives. Fruits Canned fruit in a light or heavy syrup. Fried fruit. Fruit in cream or butter sauce. Meat and other protein foods Fatty cuts of meat. Ribs. Fried meat. Berniece Salines. Sausage. Bologna and other  processed lunch meats. Salami. Fatback. Hotdogs. Bratwurst. Salted nuts and seeds. Canned beans with added salt. Canned or smoked fish. Whole eggs or egg yolks. Chicken or [place] with skin. Dairy Whole or 2% milk, cream, and half-and-half. Whole or full-fat cream cheese. Whole-fat or sweetened yogurt. Full-fat cheese. Nondairy creamers. Whipped toppings. Processed cheese and cheese spreads. Fats and oils Butter. Stick margarine. Lard. Shortening. Ghee. Bacon fat. Tropical oils, such as coconut, palm kernel, or palm oil. Seasoning and other foods Salted popcorn and pretzels. Onion salt,  garlic salt, seasoned salt, table salt, and sea salt. Worcestershire sauce. Tartar sauce. Barbecue sauce. Teriyaki sauce. Soy sauce, including reduced-sodium. Steak sauce. Canned and packaged gravies. Fish sauce. Oyster sauce. Cocktail sauce. Horseradish that you find on the shelf. Ketchup. Mustard. Meat flavorings and tenderizers. Bouillon cubes. Hot sauce and Tabasco sauce. Premade or packaged marinades. Premade or packaged taco seasonings. Relishes. Regular salad dressings. Where to find more information:  National Heart, Lung, and [place]: https://wilson-eaton.com/  American Heart Association: www.heart.org Summary  The DASH eating plan is a healthy eating plan that has been shown to reduce high blood pressure (hypertension). It may also reduce your risk for type 2 diabetes, heart disease, and stroke.  With the DASH eating plan, you should limit salt (sodium) intake to 2,300 mg a day. If you have hypertension, you may need to reduce your sodium intake to 1,500 mg a day.  When on the DASH eating plan, aim to eat more fresh fruits and vegetables, whole grains, lean proteins, low-fat dairy, and heart-healthy fats.  Work with your health care provider or diet and nutrition specialist (dietitian) to adjust your eating plan to your individual calorie needs. This information is not intended to replace advice given to  you by your health care provider. Make sure you discuss any questions you have with your health care provider. Document Released: 04/17/2011 Document Revised: 04/10/2017 Document Reviewed: 04/21/2016 Elsevier Patient Education  2020 [employer].

## 2019-01-26 NOTE — Telephone Encounter (Signed)
Called and spoke with patient regarding appointment added for 9/18 per 9/15 staff mesasge

## 2019-01-26 NOTE — Progress Notes (Addendum)
Subjective:    Patient ID: David Whitehead, male    DOB: [birth year], 48 y.o.   MRN: [id number]  Headache  This is a new problem. Episode onset: 3 weeks. Pertinent negatives include no abdominal pain, coughing, fever, nausea, rhinorrhea or weakness. Associated symptoms comments: Double vision. Treatments tried: chiropractor to work on neck, cold medicine to help with pain around his eyes.   Patient relates over the past few weeks he has had mild headache that started off in the back of his neck radiates up the right side but then he states a global headache worse at nighttime he also relates blood pressure is been up over the past few weeks he wonders if this is in relation to pain and discomfort but he denies excess salt states he tries to eat healthy does do some walking.  Denies any vomiting does relate some decrease that time denies tick bites denies fevers is being treated currently for cancer Had CAT scan the other day which was negative Patient does relate over the past 4 to 5 days double vision which appears whenever he is looking out with both eyes it does not appear with 1 eye his vision one eye at a time is actually good Visual fields is good    Review of Systems  Constitutional: Negative for activity change, fatigue and fever.  HENT: Negative for congestion and rhinorrhea.   Respiratory: Negative for cough and shortness of breath.   Cardiovascular: Negative for chest pain and leg swelling.  Gastrointestinal: Negative for abdominal pain, diarrhea and nausea.  Genitourinary: Negative for dysuria and hematuria.  Neurological: Positive for headaches. Negative for weakness.  Psychiatric/Behavioral: Negative for agitation and behavioral problems.  Positive for double vision positive for neck pain     Objective:   Physical Exam Vitals signs reviewed.  Eyes:     Extraocular Movements:     Right eye: Normal extraocular motion and no nystagmus.     Left eye: Normal extraocular  motion and no nystagmus.  Cardiovascular:     Rate and Rhythm: Normal rate and regular rhythm.     Heart sounds: Normal heart sounds. No murmur.  Pulmonary:     Effort: Pulmonary effort is normal.     Breath sounds: Normal breath sounds.  Lymphadenopathy:     Cervical: No cervical adenopathy.  Neurological:     Mental Status: He is alert.  Psychiatric:        Behavior: Behavior normal.   Pupils responsive to light Romberg negative Finger-to-nose normal No cranial nerve weakness  Blood pressure was taken with his cuff and with our cuff both of them are elevated.  Best reading 128/90 most consistent reading 134/92-95      Assessment & Plan:  Double vision Referral to neurology for further evaluation and work-up This patient is having severe headaches, upper neck pain, onset of double vision approximately 5 days ago which seems to be progressive He had a CAT scan earlier this week but CAT scan is not adequate enough to rule out possibility of small growths in the pituitary or brain or brainstem that could be causing this.  Also needs MRA to rule out possibility of aneurysms. I did speak with neurology they will be working on trying to get him in sooner and we will be sending our notes to them at [facility]. Also I spoke with neuroradiology regarding the patient in order to help get guidance on what test order  Watch salt in diet stay active  and eat healthy Start HCTZ 25 mg daily along with K-Dur 20 M EQ daily  May use Zanaflex at nighttime to help with the neck discomfort into the head may use Tylenol no NSAIDs  Patient will send [place] my chart update on blood pressures within the next 10 days Patient will do follow-up within 6 to 8 weeks

## 2019-01-28 ENCOUNTER — Other Ambulatory Visit: Payer: Self-pay | Admitting: *Deleted

## 2019-01-28 ENCOUNTER — Inpatient Hospital Stay (HOSPITAL_BASED_OUTPATIENT_CLINIC_OR_DEPARTMENT_OTHER): Payer: BC Managed Care – PPO | Admitting: Hematology

## 2019-01-28 ENCOUNTER — Inpatient Hospital Stay: Payer: BC Managed Care – PPO | Attending: Hematology

## 2019-01-28 ENCOUNTER — Encounter: Payer: Self-pay | Admitting: Family Medicine

## 2019-01-28 ENCOUNTER — Telehealth: Payer: Self-pay | Admitting: Family Medicine

## 2019-01-28 ENCOUNTER — Other Ambulatory Visit: Payer: Self-pay | Admitting: Hematology

## 2019-01-28 ENCOUNTER — Encounter: Payer: Self-pay | Admitting: Hematology

## 2019-01-28 ENCOUNTER — Other Ambulatory Visit: Payer: Self-pay

## 2019-01-28 VITALS — BP 140/99 | HR 83 | Temp 97.8°F | Resp 17 | Ht 69.0 in | Wt 211.1 lb

## 2019-01-28 DIAGNOSIS — D72820 Lymphocytosis (symptomatic): Secondary | ICD-10-CM | POA: Diagnosis not present

## 2019-01-28 DIAGNOSIS — Z79899 Other long term (current) drug therapy: Secondary | ICD-10-CM | POA: Insufficient documentation

## 2019-01-28 DIAGNOSIS — H6690 Otitis media, unspecified, unspecified ear: Secondary | ICD-10-CM | POA: Diagnosis not present

## 2019-01-28 DIAGNOSIS — C9110 Chronic lymphocytic leukemia of B-cell type not having achieved remission: Secondary | ICD-10-CM | POA: Diagnosis not present

## 2019-01-28 DIAGNOSIS — R519 Headache, unspecified: Secondary | ICD-10-CM

## 2019-01-28 DIAGNOSIS — H60502 Unspecified acute noninfective otitis externa, left ear: Secondary | ICD-10-CM

## 2019-01-28 DIAGNOSIS — H6692 Otitis media, unspecified, left ear: Secondary | ICD-10-CM | POA: Diagnosis not present

## 2019-01-28 DIAGNOSIS — Z23 Encounter for immunization: Secondary | ICD-10-CM | POA: Diagnosis not present

## 2019-01-28 DIAGNOSIS — R51 Headache: Secondary | ICD-10-CM | POA: Diagnosis not present

## 2019-01-28 DIAGNOSIS — Z792 Long term (current) use of antibiotics: Secondary | ICD-10-CM | POA: Diagnosis not present

## 2019-01-28 DIAGNOSIS — H532 Diplopia: Secondary | ICD-10-CM

## 2019-01-28 DIAGNOSIS — J329 Chronic sinusitis, unspecified: Secondary | ICD-10-CM | POA: Diagnosis not present

## 2019-01-28 LAB — CBC WITH DIFFERENTIAL (CANCER CENTER ONLY)
Abs Immature Granulocytes: 0.08 10*3/uL — ABNORMAL HIGH (ref 0.00–0.07)
Basophils Absolute: 0.2 10*3/uL — ABNORMAL HIGH (ref 0.0–0.1)
Basophils Relative: 1 %
Eosinophils Absolute: 0.1 10*3/uL (ref 0.0–0.5)
Eosinophils Relative: 0 %
HCT: 48.7 % (ref 39.0–52.0)
Hemoglobin: 15.7 g/dL (ref 13.0–17.0)
Immature Granulocytes: 0 %
Lymphocytes Relative: 70 %
Lymphs Abs: 20.4 10*3/uL — ABNORMAL HIGH (ref 0.7–4.0)
MCH: 26.7 pg (ref 26.0–34.0)
MCHC: 32.2 g/dL (ref 30.0–36.0)
MCV: 83 fL (ref 80.0–100.0)
Monocytes Absolute: 1 10*3/uL (ref 0.1–1.0)
Monocytes Relative: 3 %
Neutro Abs: 7.6 10*3/uL (ref 1.7–7.7)
Neutrophils Relative %: 26 %
Platelet Count: 279 10*3/uL (ref 150–400)
RBC: 5.87 MIL/uL — ABNORMAL HIGH (ref 4.22–5.81)
RDW: 13.8 % (ref 11.5–15.5)
WBC Count: 29.3 10*3/uL — ABNORMAL HIGH (ref 4.0–10.5)
nRBC: 0 % (ref 0.0–0.2)

## 2019-01-28 LAB — CMP (CANCER CENTER ONLY)
ALT: 16 U/L (ref 0–44)
AST: 9 U/L — ABNORMAL LOW (ref 15–41)
Albumin: 4.6 g/dL (ref 3.5–5.0)
Alkaline Phosphatase: 31 U/L — ABNORMAL LOW (ref 38–126)
Anion gap: 7 (ref 5–15)
BUN: 11 mg/dL (ref 6–20)
CO2: 30 mmol/L (ref 22–32)
Calcium: 9.8 mg/dL (ref 8.9–10.3)
Chloride: 101 mmol/L (ref 98–111)
Creatinine: 1.05 mg/dL (ref 0.61–1.24)
GFR, Est AFR Am: >60 mL/min (ref >60)
GFR, Estimated: >60 mL/min (ref >60)
Glucose, Bld: 92 mg/dL (ref 70–99)
Potassium: 4.3 mmol/L (ref 3.5–5.1)
Sodium: 138 mmol/L (ref 135–145)
Total Bilirubin: 0.8 mg/dL (ref 0.3–1.2)
Total Protein: 6.3 g/dL — ABNORMAL LOW (ref 6.5–8.1)

## 2019-01-28 LAB — SAVE SMEAR(SSMR), FOR PROVIDER SLIDE REVIEW

## 2019-01-28 MED ORDER — INFLUENZA VAC SPLIT QUAD 0.5 ML IM SUSY
0.5000 mL | PREFILLED_SYRINGE | Freq: Once | INTRAMUSCULAR | Status: AC
  Administered 2019-01-28: 0.5 mL via INTRAMUSCULAR

## 2019-01-28 MED ORDER — INFLUENZA VAC SPLIT QUAD 0.5 ML IM SUSY
PREFILLED_SYRINGE | INTRAMUSCULAR | Status: AC
  Filled 2019-01-28: qty 0.5

## 2019-01-28 MED ORDER — CEFDINIR 300 MG PO CAPS: 300.0000 mg | ORAL_CAPSULE | Freq: Two times a day (BID) | ORAL | 0 refills | Status: AC

## 2019-01-28 MED ORDER — CIPRO HC 0.2-1 % OT SUSP: 3.0000 [drp] | Freq: Two times a day (BID) | OTIC | 0 refills | Status: AC

## 2019-01-28 MED ORDER — OFLOXACIN 0.3 % OT SOLN: 5.0000 [drp] | Freq: Every day | OTIC | 0 refills | Status: AC

## 2019-01-28 NOTE — Progress Notes (Signed)
Referral put in and note from dr scott put in referral note

## 2019-01-28 NOTE — Telephone Encounter (Signed)
Referral sent to [facility] - no need to fax records, they're also on Epic  Submitted prior auth for MRI/MRA - pending decision from [facility]

## 2019-01-28 NOTE — Telephone Encounter (Signed)
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

## 2019-01-28 NOTE — Telephone Encounter (Signed)
Referral and mri and mra order put in.

## 2019-01-28 NOTE — Progress Notes (Signed)
David Whitehead OFFICE PROGRESS NOTE  Patient Care Team: David Drown, MD as PCP - General (Family Medicine) David Poche, RN as Oncology Nurse Navigator David Men, MD as Medical Oncologist (Hematology) David Binder, MD as Consulting Physician (Gastroenterology)  HEME/ONC OVERVIEW: 1. CLL with bulky lymphadenopathy; RAI Stage IV, intermediate risk; p53 deletion- -03/2018:   PB flow showed CD5+, CD23+ monoclonal B-cells, c/w CLL; IgHV mutation negative  FISH (PB) positive for JOI(78M76); negative t(11;14), p53 deletion/amplification, and ATM (11q22.3) deletion  CT neck, CAP showed bulky lymphadenopathy in bilateral cervical LN's and throughout the chest, abdomen and pelvis; marked splenomegaly -04/2018: R axillary excision LN biopsy showed CLL/SLL (cyclin D1 neg) -05/2017 - present: ibrutinib   11/2018: CT CAP showed near normalization of all LN's except R hilar LN ~3.8cm (improving)  TREATMENT REGIMEN:  05/14/2018 - present: ibrutinib 459m daily  ASSESSMENT & PLAN:  CLL with diffuse lymphadenopathy; RAI Stage IV, intermediate risk   -He is tolerating ibrutinib relatively well -Continue ibrutinib 4274mdaily  -We will plan for periodic image monitoring, next in ~6 months (ie 05/2019), unless he develops clinically suspicious symptoms for disease progression  -Prophylaxis: acyclovir, atovaquone   Leukocytosis -Secondary to CLL -Overall improving, WBC 29k -Patient denies any symptoms of infection -We will continue to monitor it for now  Otitis media -Exam notable for erythema in the left middle ear canal with some redness in the tympanic membrane; no drainage -I have prescribed cefdinir BID x 7 days and ciprofloxacin otic drop x 7 days -Continue follow-up with PCP if no improvement  Headache, retro-orbital pain -Etiology unclear, possibly due muscle spasm or otitis media; less likely related to CLL or ibrutinib -I independently reviewed the radiologic  images of CT head and sinus, and agree with findings documented -In summary, CT head did not show any intracranial abnormalities.  In addition, CT sinus did not show any evidence of acute sinus pathology. -Neurologic exam unremarkable, including cranial nerves, sensation, and strength in extremities -Given the intermittent diplopia associated with headache, I agree with neurology referral for further evaluation  No orders of the defined types were placed in this encounter.  All questions were answered. The patient knows to call the clinic with any problems, questions or concerns. No barriers to learning was detected.  Return in 1 month for labs and clinic follow-up.  [name] 01/28/2019 1:07 PM  CHIEF COMPLAINT: "My head still hurts"  INTERVAL HISTORY: Mr. MaPlacenciaeturns to clinic for follow-up of CLL on ibrutinib and new onset, persistent headache.  Patient reports that starting approximately 3 weeks ago, he developed a new onset, intermittent, dull/achy, headache, localized to the retro-orbital and bilateral temporal areas, and triggered by neck stiffness.  He has been taking Tylenol PRN with improvement in the headache.  Starting about a week ago, he also noticed intermittent double vision, and he has not been driving since then.  He was prescribed HCTZ and Zanaflex by PCP, but he has not yet started either medication.  He denies any syncope, or change in strength/sensation in the lower extremities.  He also reports of vague left-sided ear ache, but it has not been very bothersome.  He denies any other complaint today.  SUMMARY OF ONCOLOGIC HISTORY: Oncology History  [place] Initial Diagnosis   [place]  04/05/2018 Pathology Results   Peripheral Blood Flow Cytometry - MONOCLONAL B-CELL POPULATION IDENTIFIED. - SEE NOTE.   04/05/2018 Pathology Results  PB FISH: No evidence of CCND1-IGH [t(11;14)]  gene rearrangement. No evidence of trisomy 11 or gain of 11q. (excludes mantle cell lymphoma) No evidence of trisomy 12.  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 No evidence of p53 (17p13( deletion or amplification. No evidence of ATM (11q22.3) deletion.    04/07/2018 Imaging   CT neck w/ contrast: IMPRESSION: Bulky bilateral cervical lymphadenopathy consistent with lymphoproliferative disease/lymphoma.   04/07/2018 Imaging   CT CAP w/ contrast: IMPRESSION: 1. Bulky lymphadenopathy throughout the chest, abdomen and pelvis, as detailed above, highly suspicious for lymphoma. 2. Marked splenomegaly. 3. 9 mm pulmonary nodule within the anterior inferior portion of the RIGHT upper lobe. Subtle low-density nodule within the LEFT lower lobe. These are too small to definitively characterize. Neoplastic pulmonary nodules cannot be excluded. Consider PET-CT for further characterization.   04/16/2018 Surgery   Right axillary LN excisional biopsy   04/16/2018 Pathology Results   (Accession: [id number]) Lymph node for lymphoma, Right Axillary - CHRONIC LYMPHOCYTIC LEUKEMIA/SMALL LYMPHOCYTIC LYMPHOMA.     REVIEW OF SYSTEMS:   Constitutional: ( - ) fevers, ( - )  chills , ( - ) night sweats Eyes: ( - ) blurriness of vision, ( + ) double vision, ( - ) watery eyes Ears, nose, mouth, throat, and face: ( - ) mucositis, ( - ) sore throat Respiratory: ( - ) cough, ( - ) dyspnea, ( - ) wheezes Cardiovascular: ( - ) palpitation, ( - ) chest discomfort, ( - ) lower extremity swelling Gastrointestinal:  ( - ) nausea, ( - ) heartburn, ( - ) change in bowel habits Skin: ( - ) abnormal skin rashes Lymphatics: ( - ) new lymphadenopathy, ( - ) easy bruising Neurological: ( - ) numbness, ( - ) tingling, ( - ) new weaknesses Behavioral/Psych: ( - ) mood change, ( - ) new changes  All other systems were reviewed with the patient and are negative.  I have reviewed the past medical  history, past surgical history, social history and family history with the patient and they are unchanged from previous note.  ALLERGIES:  is allergic to bactrim [sulfamethoxazole-trimethoprim]; latex; and amoxicillin.  MEDICATIONS:  Current Outpatient Medications  Medication Sig Dispense Refill  . acyclovir (ZOVIRAX) 400 MG tablet Take 400 mg by mouth 2 (two) times daily.     Marland Kitchen atovaquone (MEPRON) 750 MG/5ML suspension TAKE 10 MLS (1500 MG TOTAL) BY MOUTH DAILY FOR 30 DAYS    . cefdinir (OMNICEF) 300 MG capsule Take 1 capsule (300 mg total) by mouth 2 (two) times daily for 7 days. 14 capsule 0  . ciprofloxacin-hydrocortisone (CIPRO HC) OTIC suspension Place 3 drops into the left ear 2 (two) times daily for 7 days. 10 mL 0  . hydrochlorothiazide (HYDRODIURIL) 25 MG tablet Take 1 tablet (25 mg total) by mouth daily. 30 tablet 4  . hydrOXYzine (ATARAX/VISTARIL) 10 MG tablet Take 10 mg by mouth 2 (two) times daily as needed.    . Ibrutinib (IMBRUVICA) 420 MG TABS Take by mouth daily.    . potassium chloride SA (K-DUR) 20 MEQ tablet Take 1 tablet (20 mEq total) by mouth daily. 30 tablet 3  . tiZANidine (ZANAFLEX) 4 MG tablet Take 1 tablet (4 mg total) by mouth every 6 (six) hours as needed for muscle spasms. 30 tablet 1   No current facility-administered medications for this visit.     PHYSICAL EXAMINATION: ECOG PERFORMANCE STATUS: 0 - Asymptomatic  Today's Vitals   01/28/19 1247  BP: Marland Kitchen)  140/99  Pulse: 83  Resp: 17  Temp: 97.8 F (36.6 C)  TempSrc: Temporal  Weight: 211 lb 1.3 oz (95.7 kg)  Height: '5\' 9"'  (1.753 m)  PainSc: 0-No pain   Body mass index is 31.17 kg/m.  Filed Weights   01/28/19 1247  Weight: 211 lb 1.3 oz (95.7 kg)    GENERAL: alert, no distress and comfortable SKIN: skin color, texture, turgor are normal, no rashes or significant lesions EYES: conjunctiva are pink and non-injected, sclera clear OROPHARYNX: left ear middle canal and tympanic membrane erythema,  no drainage; right ear normal  NECK: supple, non-tender LYMPH:  no palpable lymphadenopathy in the cervical LUNGS: clear to auscultation with normal breathing effort HEART: regular rate & rhythm and no murmurs and no lower extremity edema ABDOMEN: soft, non-tender, non-distended, normal bowel sounds Musculoskeletal: no cyanosis of digits and no clubbing  PSYCH: alert & oriented x 3, fluent speech NEURO: no focal motor/sensory deficits  LABORATORY DATA:  I have reviewed the data as listed    Component Value Date/Time   NA 138 01/28/2019 1225   NA 142 03/31/2018 1649   K 4.3 01/28/2019 1225   CL 101 01/28/2019 1225   CO2 30 01/28/2019 1225   GLUCOSE 92 01/28/2019 1225   BUN 11 01/28/2019 1225   BUN 16 03/31/2018 1649   CREATININE 1.05 01/28/2019 1225   CALCIUM 9.8 01/28/2019 1225   PROT 6.3 (L) 01/28/2019 1225   PROT 5.9 (L) 03/31/2018 1649   ALBUMIN 4.6 01/28/2019 1225   ALBUMIN 4.6 03/31/2018 1649   AST 9 (L) 01/28/2019 1225   ALT 16 01/28/2019 1225   ALKPHOS 31 (L) 01/28/2019 1225   BILITOT 0.8 01/28/2019 1225   GFRNONAA >60 01/28/2019 1225   GFRAA >60 01/28/2019 1225    No results found for: SPEP, UPEP  Lab Results  Component Value Date   WBC 29.3 (H) 01/28/2019   NEUTROABS 7.6 01/28/2019   HGB 15.7 01/28/2019   HCT 48.7 01/28/2019   MCV 83.0 01/28/2019   PLT 279 01/28/2019      Chemistry      Component Value Date/Time   NA 138 01/28/2019 1225   NA 142 03/31/2018 1649   K 4.3 01/28/2019 1225   CL 101 01/28/2019 1225   CO2 30 01/28/2019 1225   BUN 11 01/28/2019 1225   BUN 16 03/31/2018 1649   CREATININE 1.05 01/28/2019 1225      Component Value Date/Time   CALCIUM 9.8 01/28/2019 1225   ALKPHOS 31 (L) 01/28/2019 1225   AST 9 (L) 01/28/2019 1225   ALT 16 01/28/2019 1225   BILITOT 0.8 01/28/2019 1225       RADIOGRAPHIC STUDIES: I have personally reviewed the radiological images as listed below and agreed with the findings in the report. Ct Head Wo  Contrast  Result Date: 01/25/2019 CLINICAL DATA:  Headaches which are worsening. Chronic sinus infection. EXAM: CT HEAD WITHOUT CONTRAST CT MAXILLOFACIAL WITHOUT CONTRAST TECHNIQUE: Multidetector CT imaging of the head and maxillofacial structures were performed using the standard protocol without intravenous contrast. Multiplanar CT image reconstructions of the maxillofacial structures were also generated. COMPARISON:  None. FINDINGS: CT HEAD FINDINGS Brain: No evidence of acute infarction, hemorrhage, hydrocephalus, extra-axial collection or mass lesion/mass effect. Vascular: No hyperdense vessel or unexpected calcification. Skull: No osseous abnormality. Sinuses/Orbits: Visualized paranasal sinuses are clear. Visualized mastoid sinuses are clear. Visualized orbits demonstrate no focal abnormality. Other: None CT MAXILLOFACIAL FINDINGS Osseous: No fracture or mandibular dislocation. No destructive process.  Orbits: Negative. No traumatic or inflammatory finding. Sinuses: Clear. Soft tissues: Negative. IMPRESSION: 1. No acute intracranial pathology. 2.  No acute osseous injury of the maxillofacial bones. 3. No evidence of sinusitis. Electronically Signed   By: Kathreen Devoid   On: 01/25/2019 14:42   Ct Maxillofacial Wo Contrast  Result Date: 01/25/2019 CLINICAL DATA:  Headaches which are worsening. Chronic sinus infection. EXAM: CT HEAD WITHOUT CONTRAST CT MAXILLOFACIAL WITHOUT CONTRAST TECHNIQUE: Multidetector CT imaging of the head and maxillofacial structures were performed using the standard protocol without intravenous contrast. Multiplanar CT image reconstructions of the maxillofacial structures were also generated. COMPARISON:  None. FINDINGS: CT HEAD FINDINGS Brain: No evidence of acute infarction, hemorrhage, hydrocephalus, extra-axial collection or mass lesion/mass effect. Vascular: No hyperdense vessel or unexpected calcification. Skull: No osseous abnormality. Sinuses/Orbits: Visualized paranasal  sinuses are clear. Visualized mastoid sinuses are clear. Visualized orbits demonstrate no focal abnormality. Other: None CT MAXILLOFACIAL FINDINGS Osseous: No fracture or mandibular dislocation. No destructive process. Orbits: Negative. No traumatic or inflammatory finding. Sinuses: Clear. Soft tissues: Negative. IMPRESSION: 1. No acute intracranial pathology. 2.  No acute osseous injury of the maxillofacial bones. 3. No evidence of sinusitis. Electronically Signed   By: Kathreen Devoid   On: 01/25/2019 14:42

## 2019-01-29 ENCOUNTER — Telehealth: Payer: Self-pay | Admitting: Family Medicine

## 2019-01-29 ENCOUNTER — Other Ambulatory Visit: Payer: Self-pay | Admitting: Family Medicine

## 2019-01-29 MED ORDER — AMLODIPINE BESYLATE 2.5 MG PO TABS: 2.5000 mg | ORAL_TABLET | Freq: Every day | ORAL | 3 refills | Status: DC

## 2019-01-29 NOTE — Telephone Encounter (Signed)
pts blood pressure running high Also having headache and persistant double vision BP 146/99 Will add amlodipine 2.5 mg qd Patient to monitor BP MR pending

## 2019-01-30 ENCOUNTER — Other Ambulatory Visit: Payer: Self-pay

## 2019-01-30 ENCOUNTER — Encounter: Payer: Self-pay | Admitting: Family Medicine

## 2019-01-30 ENCOUNTER — Emergency Department (HOSPITAL_COMMUNITY)
Admission: EM | Admit: 2019-01-30 | Discharge: 2019-01-31 | Disposition: A | Discharge status: Home / Self Care | Payer: BC Managed Care – PPO | Attending: Emergency Medicine | Admitting: Emergency Medicine

## 2019-01-30 ENCOUNTER — Encounter (HOSPITAL_COMMUNITY): Payer: Self-pay | Admitting: Emergency Medicine

## 2019-01-30 ENCOUNTER — Telehealth: Payer: Self-pay | Admitting: Family Medicine

## 2019-01-30 DIAGNOSIS — I10 Essential (primary) hypertension: Secondary | ICD-10-CM | POA: Diagnosis not present

## 2019-01-30 DIAGNOSIS — Z91040 Latex allergy status: Secondary | ICD-10-CM | POA: Diagnosis not present

## 2019-01-30 DIAGNOSIS — Z856 Personal history of leukemia: Secondary | ICD-10-CM | POA: Diagnosis not present

## 2019-01-30 DIAGNOSIS — H532 Diplopia: Secondary | ICD-10-CM | POA: Insufficient documentation

## 2019-01-30 DIAGNOSIS — H538 Other visual disturbances: Secondary | ICD-10-CM | POA: Diagnosis not present

## 2019-01-30 DIAGNOSIS — Z79899 Other long term (current) drug therapy: Secondary | ICD-10-CM | POA: Diagnosis not present

## 2019-01-30 HISTORY — DX: Other chronic pain: G89.29

## 2019-01-30 HISTORY — DX: Essential (primary) hypertension: I10

## 2019-01-30 HISTORY — DX: Headache, unspecified: R51.9

## 2019-01-30 HISTORY — DX: Diplopia: H53.2

## 2019-01-30 MED ORDER — KETOROLAC TROMETHAMINE 30 MG/ML IJ SOLN
30.0000 mg | Freq: Once | INTRAMUSCULAR | Status: AC
  Administered 2019-01-30: 30 mg via INTRAVENOUS
  Filled 2019-01-30: qty 1

## 2019-01-30 MED ORDER — METOCLOPRAMIDE HCL 5 MG/ML IJ SOLN
10.0000 mg | Freq: Once | INTRAMUSCULAR | Status: AC
  Administered 2019-01-30: 10 mg via INTRAVENOUS
  Filled 2019-01-30: qty 2

## 2019-01-30 MED ORDER — DIPHENHYDRAMINE HCL 50 MG/ML IJ SOLN
25.0000 mg | Freq: Once | INTRAMUSCULAR | Status: AC
  Administered 2019-01-30: 25 mg via INTRAVENOUS
  Filled 2019-01-30: qty 1

## 2019-01-30 NOTE — ED Triage Notes (Addendum)
EDP aware of symptoms. Pt had CT scan Tuesday and was negative, pt reports PCP ordered MRI but was awaiting insurance approval. Pt was told to go to [facility] but patient didn't want to go.

## 2019-01-30 NOTE — ED Triage Notes (Addendum)
Pt reports headache for 3-4 weeks, blurred vision began one week ago intermittent and now has been constant for 3-4 days. Pt also reports neck pain for several weeks.

## 2019-01-30 NOTE — ED Triage Notes (Signed)
Pt reports couldn't drive himself to [place] safely and pt didn't have a ride to take him to [place] [facility]

## 2019-01-30 NOTE — Telephone Encounter (Signed)
I spoke with the patient and his wife today Pam have elevated blood pressure off and on throughout the weekend Patient has a history of CLL being treated by oncology We added amlodipine 2.5 mg earlier today Patient lives at progressive headache neck pain as well as had a temperature scan of 99.4 He is also had progressive double vision Patient had negative CT scan of the head on 15 September The plan was to have the patient be seen by neurology and to have MRI coming up but because of worsening condition and the possibility of aseptic meningitis patient was referred to the ER Dr. Thurnell Garbe was spoken with Patient did not have reliable transportation to [place] [facility]

## 2019-01-30 NOTE — ED Provider Notes (Signed)
[facility] EMERGENCY DEPARTMENT Provider Note   CSN: [id number] Arrival date & time: 01/30/19  2109     History   Chief Complaint Chief Complaint  Patient presents with  . Blurred Vision    HPI David Whitehead is a 48 y.o. male.     The history is provided by the patient.  Headache Pain location:  Occipital Onset quality:  Gradual Duration:  4 weeks Timing:  Constant Progression:  Worsening Chronicity:  New Relieved by:  Nothing Worsened by:  Nothing Associated symptoms: neck pain   Associated symptoms: no dizziness, no fever, no focal weakness, no hearing loss, no loss of balance, no numbness, no vomiting and no weakness   Associated symptoms comment:  Diplopia  Patient with history of CLL presents with headache and diplopia.  He reports approximately 4 weeks ago he began having occipital headaches.  Those have been consistent for several weeks.  He reports about a week ago he began having diplopia.  Reports whenever he covers either eye the vision improved.  No actual visual loss.  No focal weakness or numbness.  No syncope or dizziness. Been seen as an outpatient by his PCP as well as oncologist and had a negative CT head.  Recent labs were performed by oncology.  He reports he is supposed to have an MRI as an outpatient but this is not been scheduled yet Past Medical History:  Diagnosis Date  . Cancer ([place])   . Chronic headache   . CLL (chronic lymphocytic leukemia) ([place])   . Diplopia   . Hypertension   . Leukocytosis 04/01/2018  . Sleep apnea    uses a cpap    Patient Active Problem List   Diagnosis Date Noted  . Iron deficiency 01/20/2019  . Leukocytosis 10/22/2018  . Pulmonary nodule 08/25/2018  . CLL (chronic lymphocytic leukemia) ([place]) 04/01/2018  . Dry mouth 03/31/2018  . Sleep apnea in adult 03/31/2018    Past Surgical History:  Procedure Laterality Date  . AXILLARY LYMPH NODE DISSECTION Right 04/16/2018   Procedure: AXILLARY LYMPH NODE DEEP  EXCISION;  Surgeon: Fanny Skates, MD;  Location: [place];  Service: General;  Laterality: Right;  . FINGER SURGERY          Home Medications    Prior to Admission medications   Medication Sig Start Date End Date Taking? Authorizing Provider  acyclovir (ZOVIRAX) 400 MG tablet Take 400 mg by mouth 2 (two) times daily.    Yes [provider]  amLODipine (NORVASC) 2.5 MG tablet Take 1 tablet (2.5 mg total) by mouth daily. 01/29/19  Yes [name], MD  atovaquone [facility]) 750 MG/5ML suspension Take 750 mg by mouth daily with breakfast.  12/20/18  Yes [provider]  cefdinir (OMNICEF) 300 MG capsule Take 1 capsule (300 mg total) by mouth 2 (two) times daily for 7 days. 01/28/19 02/04/19 Yes [name], MD  ciprofloxacin-hydrocortisone (CIPRO HC) OTIC suspension Place 3 drops into the left ear 2 (two) times daily for 7 days. 01/28/19 02/04/19 Yes [name], MD  hydrochlorothiazide (HYDRODIURIL) 25 MG tablet Take 1 tablet (25 mg total) by mouth daily. 01/26/19  Yes [name], MD  hydrOXYzine (ATARAX/VISTARIL) 10 MG tablet Take 10 mg by mouth 2 (two) times daily as needed. 08/04/18  Yes [provider]  Ibrutinib (IMBRUVICA) 420 MG TABS Take by mouth daily.   Yes [provider]  ofloxacin (FLOXIN OTIC) 0.3 % OTIC solution Place 5 drops into the left ear daily  for 7 days. 01/28/19 02/04/19 Yes [name], MD  potassium chloride SA (K-DUR) 20 MEQ tablet Take 1 tablet (20 mEq total) by mouth daily. 01/26/19  Yes Luking, Elayne Snare, MD  tiZANidine (ZANAFLEX) 4 MG tablet Take 1 tablet (4 mg total) by mouth every 6 (six) hours as needed for muscle spasms. 01/26/19  Yes [name], MD    Family History Family History  Problem Relation Age of Onset  . Colon cancer Father 21       passed from [place]  . Colon cancer Paternal Uncle 34       passed from [facility]    Social History Social History   Tobacco Use  . Smoking status: Never Smoker  . Smokeless tobacco: Never  Used  Substance Use Topics  . Alcohol use: Not Currently  . Drug use: Never     Allergies   Bactrim [sulfamethoxazole-trimethoprim], Latex, and Amoxicillin   Review of Systems Review of Systems  Constitutional: Negative for fever.  HENT: Negative for hearing loss.   Eyes: Positive for visual disturbance.  Respiratory: Negative for shortness of breath.   Cardiovascular: Negative for chest pain.  Gastrointestinal: Negative for vomiting.  Musculoskeletal: Positive for neck pain.  Neurological: Positive for headaches. Negative for dizziness, focal weakness, syncope, weakness, numbness and loss of balance.  All other systems reviewed and are negative.    Physical Exam Updated Vital Signs BP (!) 132/99   Pulse 78   Temp 98.2 F (36.8 C) (Oral)   Resp 20   Ht 1.778 m (5\' 10" )   Wt 95.7 kg   SpO2 100%   BMI 30.27 kg/m   Physical Exam CONSTITUTIONAL: Well developed/well nourished HEAD: Normocephalic/atraumatic EYES: EOMI/PERRL, no nystagmus, no ptosis, no visual field deficits, patient reports diplopia improves when covering either eye ENMT: Mucous membranes moist NECK: supple no meningeal signs, no bruits SPINE/BACK:entire spine nontender CV: S1/S2 noted, no murmurs/rubs/gallops noted LUNGS: Lungs are clear to auscultation bilaterally, no apparent distress ABDOMEN: soft, nontender, no rebound or guarding GU:no cva tenderness NEURO:Awake/alert, face symmetric, no arm or leg drift is noted Equal 5/5 strength with shoulder abduction, elbow flex/extension, wrist flex/extension in upper extremities and equal hand grips bilaterally Equal 5/5 strength with hip flexion,knee flex/extension, foot dorsi/plantar flexion Cranial nerves 3/4/5/6/11/17/08/11/12 tested and intact Gait normal without ataxia No past pointing Sensation to light touch intact in all extremities EXTREMITIES: pulses normal, full ROM SKIN: warm, color normal PSYCH: no abnormalities of mood noted, alert and  oriented to situation    ED Treatments / Results  Labs (all labs ordered are listed, but only abnormal results are displayed) Labs Reviewed - No data to display  EKG None  Radiology No results found.  Procedures Procedures  Medications Ordered in ED Medications  metoCLOPramide (REGLAN) injection 10 mg (10 mg Intravenous Given 01/30/19 2335)  diphenhydrAMINE (BENADRYL) injection 25 mg (25 mg Intravenous Given 01/30/19 2335)  ketorolac (TORADOL) 30 MG/ML injection 30 mg (30 mg Intravenous Given 01/30/19 2335)     Initial Impression / Assessment and Plan / ED Course  I have reviewed the triage vital signs and the nursing notes.      11:56 PM I reviewed recent notes from his outpatient providers.  At this time patient is afebrile, no meningeal signs. Low suspicion for meningitis 12:42 AM Overall patient is improved.  He walked to the bathroom without difficulty.  He is afebrile, no meningeal signs.  No focal weakness.  I did offer transfer to [facility]  for MRI tonight.  Patient declines, and will follow-up as an outpatient. Outpatient referral sent to [facility] neurology.  Patient will likely need MRI brain with and without contrast as well as MRI orbits. No signs of any acute neurologic/infectious etiology. Patient has good follow-up support by his PCP and specialist  Final Clinical Impressions(s) / ED Diagnoses   Final diagnoses:  Diplopia    ED Discharge Orders    None       Ripley Fraise, MD 01/31/19 [id number]

## 2019-01-31 ENCOUNTER — Telehealth: Payer: Self-pay | Admitting: Family Medicine

## 2019-01-31 ENCOUNTER — Telehealth: Payer: Self-pay | Admitting: Hematology

## 2019-01-31 ENCOUNTER — Ambulatory Visit (HOSPITAL_COMMUNITY)
Admission: RE | Admit: 2019-01-31 | Discharge: 2019-01-31 | Disposition: A | Discharge status: Home / Self Care | Payer: BC Managed Care – PPO | Source: Ambulatory Visit | Attending: Family Medicine | Admitting: Family Medicine

## 2019-01-31 DIAGNOSIS — R519 Headache, unspecified: Secondary | ICD-10-CM

## 2019-01-31 DIAGNOSIS — H532 Diplopia: Secondary | ICD-10-CM

## 2019-01-31 DIAGNOSIS — R51 Headache: Secondary | ICD-10-CM | POA: Diagnosis not present

## 2019-01-31 LAB — POCT I-STAT CREATININE: Creatinine, Ser: 1.1 mg/dL (ref 0.61–1.24)

## 2019-01-31 LAB — LACTATE DEHYDROGENASE: LDH: 123 U/L (ref 98–192)

## 2019-01-31 MED ORDER — GADOBUTROL 1 MMOL/ML IV SOLN
10.0000 mL | Freq: Once | INTRAVENOUS | Status: AC | PRN
  Administered 2019-01-31: 10 mL via INTRAVENOUS

## 2019-01-31 NOTE — Telephone Encounter (Signed)
No change in appts per 9/18 los

## 2019-01-31 NOTE — Addendum Note (Signed)
Addended by: Vicente Males on: 01/31/2019 12:19 PM   Modules accepted: Orders

## 2019-01-31 NOTE — Telephone Encounter (Signed)
Peer-to-peer was spoken with.  Approval number was received and forwarded to the nurse They approve MRI of the brain with and without contrast MRI of the brain was not approved Please move forward with setting up the MRI and letting the patient know-please also let me know when the MRI is Hopefully since this is a stat order it can get them relatively quickly

## 2019-01-31 NOTE — Telephone Encounter (Signed)
Patient having worsening headaches with double vision Was in ER on Sunday night they recommended MRI at [facility] the patient did not want to be admitted  Please see what we can get done at ordering the MRI of the brain with and without contrast and MRA of the head stat Ideally I would like to have it completed today or tomorrow within the [facility] network  These tests were previously ordered and have not been scheduled at this point.  Because of the worsening condition of the patient these tests are now stat  If necessary get Ebony/Brendale  involved to get the ball moving on this and to get it scheduled ASAP

## 2019-01-31 NOTE — Telephone Encounter (Signed)
MRI Brain with/without contrast scheduled for today. Pt contacted and informed to head on over to [facility]. Pt verbalized understanding

## 2019-01-31 NOTE — Telephone Encounter (Signed)
[place] and attempted to get procedures attempted. Lesly Rubenstein, the nurse I spoke with states that provider needs to contact [facility] to have a peer to peer. An immediate response is issued that way. Please advise.   [place] phone number [phone] Patient ID [id number]

## 2019-02-01 NOTE — Telephone Encounter (Signed)
Patient did have an MRI of the brain-results are in the system The insurance company would not approve the MRA Patient does have initial consultation with Dr. Felecia Shelling on Wednesday for his problem of headaches and double vision Neurology at that point will decide if any further testing necessary (A copy of this message being forwarded to Dr. Felecia Shelling)

## 2019-02-02 ENCOUNTER — Encounter: Payer: Self-pay | Admitting: Neurology

## 2019-02-02 ENCOUNTER — Other Ambulatory Visit: Payer: Self-pay

## 2019-02-02 ENCOUNTER — Encounter: Payer: Self-pay | Admitting: Hematology

## 2019-02-02 ENCOUNTER — Ambulatory Visit (INDEPENDENT_AMBULATORY_CARE_PROVIDER_SITE_OTHER): Payer: BC Managed Care – PPO | Admitting: Neurology

## 2019-02-02 VITALS — BP 133/83 | HR 86 | Temp 97.8°F | Ht 70.0 in | Wt 211.8 lb

## 2019-02-02 DIAGNOSIS — R9082 White matter disease, unspecified: Secondary | ICD-10-CM | POA: Insufficient documentation

## 2019-02-02 DIAGNOSIS — C9110 Chronic lymphocytic leukemia of B-cell type not having achieved remission: Secondary | ICD-10-CM | POA: Diagnosis not present

## 2019-02-02 DIAGNOSIS — G4489 Other headache syndrome: Secondary | ICD-10-CM | POA: Diagnosis not present

## 2019-02-02 DIAGNOSIS — H532 Diplopia: Secondary | ICD-10-CM | POA: Diagnosis not present

## 2019-02-02 MED ORDER — MELOXICAM 15 MG PO TABS: 15.0000 mg | ORAL_TABLET | Freq: Every day | ORAL | 5 refills | Status: DC

## 2019-02-02 NOTE — Progress Notes (Signed)
[facility]  [name] DOB: 1970/11/10  REFERRING DOCTOR OR PCP: [name] SOURCE: Patient, notes from Dr. Wolfgang Phoenix, imaging and lab reports, MRI images personally reviewed.  _________________________________   HISTORICAL  CHIEF COMPLAINT:  Chief Complaint  Patient presents with  . New Patient (Initial Visit)    RM 12, alone. Internal referral for headaches/double vision. Wears glasses.     HISTORY OF PRESENT ILLNESS:  I had the pleasure of seeing patient, David Whitehead, at [facility] for neurologic consultation regarding his headaches and more recent onset of diplopia.  He is a 48 year old man with chronic lymphocytic leukemia who had the onset of headache 5 weeks ago and diplopia 2 weeks ago.   The headache is bifrontal.  He also had the onset of neck pain that radiated up the back of the head towards the vertex.  The headache is actually a little better this week but he still notes some pain in the upper neck/occipit as the day goes on.  Activity also increases the headaches.  No nausea or vomiting.  He does not have photophobia or phonophobia.  Two weeks ago, he smashed a rock to kill a snake and noticed he had visual disturbance with diplopia when both eyes were open and reduced ability to see with either eye individually.   He noticed diplopia watching TV and driving.   This has persisted and is mildly worse compared to 2 weeks ago.  Of note, the diplopia is not constant and he does not note any at this time.     Due to his symptoms, he had several studies.  CT scan 01/25/19 was normal.   MRI of the brain 01/31/2019 showed mild white matter foci but no acute findings.   I personally reviewed these studies.   He has 15-20 small round foci in subcortical > deep white matter --- DDx is early chronic microvascular ischemic change, changes from migraine.     He is not a smoker or drinker.   He does not have DM.   He has had  mild essential hypertension.   He has CLL and is on ibrutinib.   His last WBC is 29.3k (elevated lymphocytes on diff).   His WBC late last year was >200,000 before starting ibrutinib and 50-60,000 when symptoms started.     REVIEW OF SYSTEMS: Constitutional: No fevers, chills, sweats, or change in appetite Eyes: As above Ear, nose and throat: No hearing loss, ear pain, nasal congestion, sore throat Cardiovascular: No chest pain, palpitations Respiratory: No shortness of breath at rest or with exertion.   No wheezes GastrointestinaI: No nausea, vomiting, diarrhea, abdominal pain, fecal incontinence Genitourinary: No dysuria, urinary retention or frequency.  No nocturia. Musculoskeletal: No neck pain, back pain Integumentary: No rash, pruritus, skin lesions Neurological: as above Psychiatric: No depression at this time.  No anxiety Endocrine: No palpitations, diaphoresis, change in appetite, change in weigh or increased thirst Hematologic/Lymphatic:As above. Allergic/Immunologic: No itchy/runny eyes, nasal congestion, recent allergic reactions, rashes  ALLERGIES: Allergies  Allergen Reactions  . Bactrim [Sulfamethoxazole-Trimethoprim] Rash  . Latex Other (See Comments)    Took skin off when removed tape  . Amoxicillin Rash    Has patient had a PCN reaction causing immediate rash, facial/tongue/throat swelling, SOB or lightheadedness with hypotension: No Has patient had a PCN reaction causing severe rash involving mucus membranes or skin necrosis: No Has patient had a PCN reaction that required hospitalization: No Has patient had a PCN reaction  occurring within the last 10 years: No If all of the above answers are "NO", then may proceed with Cephalosporin use.     HOME MEDICATIONS:  Current Outpatient Medications:  .  acyclovir (ZOVIRAX) 400 MG tablet, Take 400 mg by mouth 2 (two) times daily. , Disp: , Rfl:  .  amLODipine (NORVASC) 2.5 MG tablet, Take 1 tablet (2.5 mg total) by  mouth daily., Disp: 30 tablet, Rfl: 3 .  atovaquone (MEPRON) 750 MG/5ML suspension, Take 750 mg by mouth daily with breakfast. , Disp: , Rfl:  .  cefdinir (OMNICEF) 300 MG capsule, Take 1 capsule (300 mg total) by mouth 2 (two) times daily for 7 days., Disp: 14 capsule, Rfl: 0 .  ciprofloxacin-hydrocortisone (CIPRO HC) OTIC suspension, Place 3 drops into the left ear 2 (two) times daily for 7 days., Disp: 10 mL, Rfl: 0 .  hydrochlorothiazide (HYDRODIURIL) 25 MG tablet, Take 1 tablet (25 mg total) by mouth daily., Disp: 30 tablet, Rfl: 4 .  hydrOXYzine (ATARAX/VISTARIL) 10 MG tablet, Take 10 mg by mouth 2 (two) times daily as needed., Disp: , Rfl:  .  Ibrutinib (IMBRUVICA) 420 MG TABS, Take by mouth daily., Disp: , Rfl:  .  ofloxacin (FLOXIN OTIC) 0.3 % OTIC solution, Place 5 drops into the left ear daily for 7 days., Disp: 10 mL, Rfl: 0 .  potassium chloride SA (K-DUR) 20 MEQ tablet, Take 1 tablet (20 mEq total) by mouth daily., Disp: 30 tablet, Rfl: 3 .  tiZANidine (ZANAFLEX) 4 MG tablet, Take 1 tablet (4 mg total) by mouth every 6 (six) hours as needed for muscle spasms., Disp: 30 tablet, Rfl: 1 .  meloxicam (MOBIC) 15 MG tablet, Take 1 tablet (15 mg total) by mouth daily., Disp: 30 tablet, Rfl: 5  PAST MEDICAL HISTORY: Past Medical History:  Diagnosis Date  . Cancer ([place])   . Chronic headache   . CLL (chronic lymphocytic leukemia) ([place])   . Diplopia   . Hypertension   . Leukocytosis 04/01/2018  . Sleep apnea    uses a cpap    PAST SURGICAL HISTORY: Past Surgical History:  Procedure Laterality Date  . AXILLARY LYMPH NODE DISSECTION Right 04/16/2018   Procedure: AXILLARY LYMPH NODE DEEP EXCISION;  Surgeon: Fanny Skates, MD;  Location: [place];  Service: General;  Laterality: Right;  . FINGER SURGERY      FAMILY HISTORY: Family History  Problem Relation Age of Onset  . Colon cancer Father 63       passed from [place]  . Colon cancer Paternal Uncle 64       passed from [place]:  Social History   Socioeconomic History  . Marital status: Married    Spouse name: [name]  . Number of children: 0  . Years of education: 46  . Highest education level: Not on file  Occupational History  . Not on file  Social Needs  . Financial resource strain: Not on file  . Food insecurity    Worry: Not on file    Inability: Not on file  . Transportation needs    Medical: Not on file    Non-medical: Not on file  Tobacco Use  . Smoking status: Never Smoker  . Smokeless tobacco: Never Used  Substance and Sexual Activity  . Alcohol use: Not Currently  . Drug use: Never  . Sexual activity: Yes  Lifestyle  . Physical activity    Days per week: Not on file  Minutes per session: Not on file  . Stress: Not on file  Relationships  . Social Herbalist on phone: Not on file    Gets together: Not on file    Attends religious service: Not on file    Active member of club or organization: Not on file    Attends meetings of clubs or organizations: Not on file    Relationship status: Not on file  . Intimate partner violence    Fear of current or ex partner: Not on file    Emotionally abused: Not on file    Physically abused: Not on file    Forced sexual activity: Not on file  Other Topics Concern  . Not on file  Social History Narrative   Right handed    Lives with wife, [name]   Caffeine use: rare     PHYSICAL EXAM  Vitals:   02/02/19 1054  BP: 133/83  Pulse: 86  Temp: 97.8 F (36.6 C)  Weight: 211 lb 12.8 oz (96.1 kg)  Height: 5\' 10"  (1.778 m)    Body mass index is 30.39 kg/m.   General: The patient is well-developed and well-nourished and in no acute distress  HEENT:  Head is [place]/AT.  Sclera are anicteric.  Funduscopic exam shows normal optic discs and retinal vessels.  Neck: No carotid bruits are noted.  The neck is nontender.  Cardiovascular: The heart has a regular rate and rhythm with a normal S1 and S2. There were no  murmurs, gallops or rubs.    Skin: Extremities are without rash or  edema.   Neurologic Exam  Mental status: The patient is alert and oriented x 3 at the time of the examination. The patient has apparent normal recent and remote memory, with an apparently normal attention span and concentration ability.   Speech is normal.  Cranial nerves: Extraocular movements are full. Pupils are equal, round, and reactive to light and accomodation.  Visual fields are full.  Facial symmetry is present. There is good facial sensation to soft touch bilaterally.Facial strength is normal.  Trapezius and sternocleidomastoid strength is normal. No dysarthria is noted. No obvious hearing deficits are noted.  Motor:  Muscle bulk is normal.   Tone is normal. Strength is  5 / 5 in all 4 extremities.   Sensory: Sensory testing is intact to pinprick, soft touch and vibration sensation in all 4 extremities.  Coordination: Cerebellar testing reveals good finger-nose-finger and heel-to-shin bilaterally.  Gait and station: Station is normal.   Gait is normal. Tandem gait is normal. Romberg is negative.   Reflexes: Deep tendon reflexes are symmetric and normal bilaterally.   Plantar responses are flexor.    DIAGNOSTIC DATA (LABS, IMAGING, TESTING) - I reviewed patient records, labs, notes, testing and imaging myself where available.  Lab Results  Component Value Date   WBC 29.3 (H) 01/28/2019   HGB 15.7 01/28/2019   HCT 48.7 01/28/2019   MCV 83.0 01/28/2019   PLT 279 01/28/2019      Component Value Date/Time   NA 138 01/28/2019 1225   NA 142 03/31/2018 1649   K 4.3 01/28/2019 1225   CL 101 01/28/2019 1225   CO2 30 01/28/2019 1225   GLUCOSE 92 01/28/2019 1225   BUN 11 01/28/2019 1225   BUN 16 03/31/2018 1649   CREATININE 1.10 01/31/2019 1456   CREATININE 1.05 01/28/2019 1225   CALCIUM 9.8 01/28/2019 1225   PROT 6.3 (L) 01/28/2019 1225   PROT 5.9 (  L) 03/31/2018 1649   ALBUMIN 4.6 01/28/2019 1225    ALBUMIN 4.6 03/31/2018 1649   AST 9 (L) 01/28/2019 1225   ALT 16 01/28/2019 1225   ALKPHOS 31 (L) 01/28/2019 1225   BILITOT 0.8 01/28/2019 1225   GFRNONAA >60 01/28/2019 1225   GFRAA >60 01/28/2019 1225    Lab Results  Component Value Date   TSH 1.752 12/28/2018       ASSESSMENT AND PLAN  CLL (chronic lymphocytic leukemia) ([place]) - Plan: DG FL GUIDED LUMBAR PUNCTURE  Other headache syndrome - Plan: DG FL GUIDED LUMBAR PUNCTURE  Diplopia - Plan: DG FL GUIDED LUMBAR PUNCTURE  White matter abnormality on MRI of brain   In summary, Mr. Wennberg is a 48 year old man with a history of chronic lymphocytic leukemia currently being treated with ibrutinib who had the onset of headaches about 5 weeks ago and the onset of diplopia about 2 weeks ago.  His neurologic examination today is normal.  Because of his symptoms and history of CLL , we need to be concerned about leptomeningeal spread.  I saw no evidence of this on the MRI.  However, we need to check a lumbar puncture and check the CSF for evidence of CLL and for slower infections (fungal or TB).  Headaches could be tension type and I will have him stop meloxicam.  The MRI is abnormal with about 15-20 small round subcortical and deep white matter foci.  These most likely represent changes from early chronic microvascular ischemic change (risk factors would be hypertension and possibly the CLL/sludging).  The appearance is not typical for demyelination.  He will call us if he has new or worsening symptoms and we will let him know the results of the lumbar puncture after it is done.  I will see him back in 4 weeks but he is advised to call sooner if problems.  Thank you for asking me to see Mr. Waite.  Please let me know if I can be of further assistance with him or other patients in the future.   Richard A. Felecia Shelling, MD, [facility] [id number], [id number] PM Certified in Neurology, Clinical Neurophysiology, Sleep Medicine and Neuroimaging   [facility] [address], [place], [place] [address] [phone]

## 2019-02-03 MED FILL — IMBRUVICA 140 MG CAPSULE: 140 | 30 days supply | Qty: 90 | Fill #2

## 2019-02-04 ENCOUNTER — Telehealth: Payer: Self-pay | Admitting: Neurology

## 2019-02-04 ENCOUNTER — Other Ambulatory Visit: Payer: Self-pay

## 2019-02-04 ENCOUNTER — Ambulatory Visit
Admission: RE | Admit: 2019-02-04 | Discharge: 2019-02-04 | Disposition: A | Discharge status: Home / Self Care | Payer: BC Managed Care – PPO | Source: Ambulatory Visit | Attending: Neurology | Admitting: Neurology

## 2019-02-04 ENCOUNTER — Other Ambulatory Visit (HOSPITAL_COMMUNITY)
Admission: RE | Admit: 2019-02-04 | Discharge: 2019-02-04 | Disposition: A | Discharge status: Home / Self Care | Payer: BC Managed Care – PPO | Source: Ambulatory Visit | Attending: Neurology | Admitting: Neurology

## 2019-02-04 VITALS — BP 128/86 | HR 83

## 2019-02-04 DIAGNOSIS — G4489 Other headache syndrome: Secondary | ICD-10-CM

## 2019-02-04 DIAGNOSIS — C9110 Chronic lymphocytic leukemia of B-cell type not having achieved remission: Secondary | ICD-10-CM | POA: Diagnosis not present

## 2019-02-04 DIAGNOSIS — R51 Headache: Secondary | ICD-10-CM | POA: Diagnosis not present

## 2019-02-04 DIAGNOSIS — H532 Diplopia: Secondary | ICD-10-CM

## 2019-02-04 DIAGNOSIS — R9082 White matter disease, unspecified: Secondary | ICD-10-CM | POA: Diagnosis not present

## 2019-02-04 DIAGNOSIS — D72820 Lymphocytosis (symptomatic): Secondary | ICD-10-CM | POA: Diagnosis not present

## 2019-02-04 NOTE — Discharge Instructions (Signed)

## 2019-02-04 NOTE — Progress Notes (Signed)
Blood obtained from pt's R AC for LP labs. Pt tolerated procedure well. Site is unremarkable.  

## 2019-02-04 NOTE — Telephone Encounter (Signed)
I added the lymphoma/leukemia panel ([facility] [phone]) and confirmed that fungal cultures are part of the CSF culture.

## 2019-02-04 NOTE — Telephone Encounter (Signed)
He had a lumbar puncture earlier today.  The opening pressure was elevated.  Some of the labs appear inconsistent.  Specifically, the microbiology lab reported many white blood cells but the cell count showed 500 red cells but only 2 white blood cells (0-5 white blood cells would be normal)   I called [facility] to speak to a technician to help clarify.  I gave my contact information and they will call back.

## 2019-02-07 ENCOUNTER — Encounter (HOSPITAL_COMMUNITY): Payer: Self-pay

## 2019-02-07 ENCOUNTER — Telehealth: Payer: Self-pay | Admitting: Family Medicine

## 2019-02-07 ENCOUNTER — Other Ambulatory Visit: Payer: Self-pay

## 2019-02-07 ENCOUNTER — Inpatient Hospital Stay (HOSPITAL_COMMUNITY)
Admission: EM | Admit: 2019-02-07 | Discharge: 2019-03-06 | DRG: 032 | Disposition: A | Discharge status: Home / Self Care | Payer: BC Managed Care – PPO | Attending: Family Medicine | Admitting: Family Medicine

## 2019-02-07 DIAGNOSIS — R21 Rash and other nonspecific skin eruption: Secondary | ICD-10-CM

## 2019-02-07 DIAGNOSIS — C9110 Chronic lymphocytic leukemia of B-cell type not having achieved remission: Secondary | ICD-10-CM | POA: Diagnosis present

## 2019-02-07 DIAGNOSIS — G039 Meningitis, unspecified: Secondary | ICD-10-CM | POA: Diagnosis not present

## 2019-02-07 DIAGNOSIS — G0481 Other encephalitis and encephalomyelitis: Secondary | ICD-10-CM | POA: Diagnosis present

## 2019-02-07 DIAGNOSIS — N179 Acute kidney failure, unspecified: Secondary | ICD-10-CM | POA: Diagnosis not present

## 2019-02-07 DIAGNOSIS — H9319 Tinnitus, unspecified ear: Secondary | ICD-10-CM | POA: Diagnosis not present

## 2019-02-07 DIAGNOSIS — G4733 Obstructive sleep apnea (adult) (pediatric): Secondary | ICD-10-CM | POA: Diagnosis not present

## 2019-02-07 DIAGNOSIS — I10 Essential (primary) hypertension: Secondary | ICD-10-CM

## 2019-02-07 DIAGNOSIS — Z20828 Contact with and (suspected) exposure to other viral communicable diseases: Secondary | ICD-10-CM | POA: Diagnosis not present

## 2019-02-07 DIAGNOSIS — Z0184 Encounter for antibody response examination: Secondary | ICD-10-CM

## 2019-02-07 DIAGNOSIS — G0490 Encephalitis and encephalomyelitis, unspecified: Secondary | ICD-10-CM | POA: Diagnosis not present

## 2019-02-07 DIAGNOSIS — Z883 Allergy status to other anti-infective agents status: Secondary | ICD-10-CM | POA: Diagnosis not present

## 2019-02-07 DIAGNOSIS — G914 Hydrocephalus in diseases classified elsewhere: Secondary | ICD-10-CM | POA: Diagnosis not present

## 2019-02-07 DIAGNOSIS — E119 Type 2 diabetes mellitus without complications: Secondary | ICD-10-CM | POA: Diagnosis present

## 2019-02-07 DIAGNOSIS — Z800 Family history of malignant neoplasm of digestive organs: Secondary | ICD-10-CM

## 2019-02-07 DIAGNOSIS — R591 Generalized enlarged lymph nodes: Secondary | ICD-10-CM | POA: Diagnosis present

## 2019-02-07 DIAGNOSIS — Z9221 Personal history of antineoplastic chemotherapy: Secondary | ICD-10-CM

## 2019-02-07 DIAGNOSIS — Z79899 Other long term (current) drug therapy: Secondary | ICD-10-CM

## 2019-02-07 DIAGNOSIS — B451 Cerebral cryptococcosis: Secondary | ICD-10-CM | POA: Diagnosis not present

## 2019-02-07 DIAGNOSIS — Z881 Allergy status to other antibiotic agents status: Secondary | ICD-10-CM | POA: Diagnosis not present

## 2019-02-07 DIAGNOSIS — Z982 Presence of cerebrospinal fluid drainage device: Secondary | ICD-10-CM | POA: Diagnosis not present

## 2019-02-07 DIAGNOSIS — H9312 Tinnitus, left ear: Secondary | ICD-10-CM | POA: Diagnosis present

## 2019-02-07 DIAGNOSIS — D509 Iron deficiency anemia, unspecified: Secondary | ICD-10-CM | POA: Diagnosis present

## 2019-02-07 DIAGNOSIS — B029 Zoster without complications: Secondary | ICD-10-CM | POA: Diagnosis present

## 2019-02-07 DIAGNOSIS — J302 Other seasonal allergic rhinitis: Secondary | ICD-10-CM | POA: Diagnosis present

## 2019-02-07 DIAGNOSIS — Z6829 Body mass index (BMI) 29.0-29.9, adult: Secondary | ICD-10-CM

## 2019-02-07 DIAGNOSIS — Z91040 Latex allergy status: Secondary | ICD-10-CM

## 2019-02-07 DIAGNOSIS — R911 Solitary pulmonary nodule: Secondary | ICD-10-CM | POA: Diagnosis present

## 2019-02-07 DIAGNOSIS — G4730 Sleep apnea, unspecified: Secondary | ICD-10-CM | POA: Diagnosis not present

## 2019-02-07 DIAGNOSIS — E872 Acidosis: Secondary | ICD-10-CM | POA: Diagnosis not present

## 2019-02-07 DIAGNOSIS — G939 Disorder of brain, unspecified: Secondary | ICD-10-CM | POA: Diagnosis not present

## 2019-02-07 DIAGNOSIS — L270 Generalized skin eruption due to drugs and medicaments taken internally: Secondary | ICD-10-CM | POA: Diagnosis not present

## 2019-02-07 DIAGNOSIS — Z9989 Dependence on other enabling machines and devices: Secondary | ICD-10-CM

## 2019-02-07 DIAGNOSIS — G919 Hydrocephalus, unspecified: Secondary | ICD-10-CM | POA: Diagnosis not present

## 2019-02-07 DIAGNOSIS — J3489 Other specified disorders of nose and nasal sinuses: Secondary | ICD-10-CM | POA: Diagnosis not present

## 2019-02-07 DIAGNOSIS — D696 Thrombocytopenia, unspecified: Secondary | ICD-10-CM | POA: Diagnosis not present

## 2019-02-07 DIAGNOSIS — Z95828 Presence of other vascular implants and grafts: Secondary | ICD-10-CM | POA: Diagnosis not present

## 2019-02-07 DIAGNOSIS — E663 Overweight: Secondary | ICD-10-CM | POA: Diagnosis present

## 2019-02-07 DIAGNOSIS — T451X5A Adverse effect of antineoplastic and immunosuppressive drugs, initial encounter: Secondary | ICD-10-CM | POA: Diagnosis present

## 2019-02-07 DIAGNOSIS — G009 Bacterial meningitis, unspecified: Secondary | ICD-10-CM | POA: Diagnosis not present

## 2019-02-07 LAB — CBC WITH DIFFERENTIAL/PLATELET
Abs Immature Granulocytes: 0 10*3/uL (ref 0.00–0.07)
Basophils Absolute: 0.1 10*3/uL (ref 0.0–0.1)
Basophils Relative: 1 %
Eosinophils Absolute: 0.3 10*3/uL (ref 0.0–0.5)
Eosinophils Relative: 2 %
HCT: 45.9 % (ref 39.0–52.0)
Hemoglobin: 14.9 g/dL (ref 13.0–17.0)
Lymphocytes Relative: 61 %
Lymphs Abs: 8.1 10*3/uL — ABNORMAL HIGH (ref 0.7–4.0)
MCH: 27.1 pg (ref 26.0–34.0)
MCHC: 32.5 g/dL (ref 30.0–36.0)
MCV: 83.6 fL (ref 80.0–100.0)
Monocytes Absolute: 0.3 10*3/uL (ref 0.1–1.0)
Monocytes Relative: 2 %
Neutro Abs: 4.5 10*3/uL (ref 1.7–7.7)
Neutrophils Relative %: 34 %
Platelets: 255 10*3/uL (ref 150–400)
RBC: 5.49 MIL/uL (ref 4.22–5.81)
RDW: 13.6 % (ref 11.5–15.5)
WBC: 13.2 10*3/uL — ABNORMAL HIGH (ref 4.0–10.5)
nRBC: 0 % (ref 0.0–0.2)
nRBC: 0 /100 WBC

## 2019-02-07 LAB — COMPREHENSIVE METABOLIC PANEL
ALT: 27 U/L (ref 0–44)
AST: 14 U/L — ABNORMAL LOW (ref 15–41)
Albumin: 3.9 g/dL (ref 3.5–5.0)
Alkaline Phosphatase: 30 U/L — ABNORMAL LOW (ref 38–126)
Anion gap: 10 (ref 5–15)
BUN: 9 mg/dL (ref 6–20)
CO2: 26 mmol/L (ref 22–32)
Calcium: 8.8 mg/dL — ABNORMAL LOW (ref 8.9–10.3)
Chloride: 101 mmol/L (ref 98–111)
Creatinine, Ser: 0.97 mg/dL (ref 0.61–1.24)
GFR calc Af Amer: >60 mL/min (ref >60)
GFR calc non Af Amer: >60 mL/min (ref >60)
Glucose, Bld: 103 mg/dL — ABNORMAL HIGH (ref 70–99)
Potassium: 3.7 mmol/L (ref 3.5–5.1)
Sodium: 137 mmol/L (ref 135–145)
Total Bilirubin: 0.4 mg/dL (ref 0.3–1.2)
Total Protein: 6.1 g/dL — ABNORMAL LOW (ref 6.5–8.1)

## 2019-02-07 LAB — CYTOLOGY - NON PAP

## 2019-02-07 LAB — PROTIME-INR
INR: 1 (ref 0.8–1.2)
Prothrombin Time: 13.3 seconds (ref 11.4–15.2)

## 2019-02-07 LAB — LACTIC ACID, PLASMA: Lactic Acid, Venous: 1.3 mmol/L (ref 0.5–1.9)

## 2019-02-07 LAB — SARS CORONAVIRUS 2 (TAT 6-24 HRS): SARS Coronavirus 2: NEGATIVE

## 2019-02-07 MED ORDER — ACYCLOVIR 400 MG PO TABS
400.0000 mg | ORAL_TABLET | Freq: Two times a day (BID) | ORAL | Status: DC
  Administered 2019-02-07 – 2019-03-06 (×53): 400 mg via ORAL
  Filled 2019-02-07 (×54): qty 1

## 2019-02-07 MED ORDER — SODIUM CHLORIDE 0.9 % IV SOLN
INTRAVENOUS | Status: DC
  Administered 2019-02-07 – 2019-02-18 (×15): via INTRAVENOUS
  Administered 2019-02-19: 1000 mL via INTRAVENOUS

## 2019-02-07 MED ORDER — DIPHENHYDRAMINE HCL 50 MG/ML IJ SOLN
25.0000 mg | Freq: Every day | INTRAMUSCULAR | Status: DC | PRN
  Administered 2019-02-07 – 2019-03-03 (×6): 25 mg via INTRAVENOUS
  Filled 2019-02-07 (×6): qty 1

## 2019-02-07 MED ORDER — DIPHENHYDRAMINE HCL 25 MG PO CAPS
25.0000 mg | ORAL_CAPSULE | Freq: Every day | ORAL | Status: DC | PRN
  Administered 2019-02-24 – 2019-02-25 (×2): 25 mg via ORAL
  Filled 2019-02-07 (×2): qty 1

## 2019-02-07 MED ORDER — METOPROLOL TARTRATE 12.5 MG HALF TABLET
12.5000 mg | ORAL_TABLET | Freq: Two times a day (BID) | ORAL | Status: DC
  Administered 2019-02-07 – 2019-02-21 (×27): 12.5 mg via ORAL
  Filled 2019-02-07 (×27): qty 1

## 2019-02-07 MED ORDER — ACETAMINOPHEN 325 MG PO TABS
650.0000 mg | ORAL_TABLET | Freq: Every day | ORAL | Status: DC | PRN
  Administered 2019-02-21: 650 mg via ORAL
  Filled 2019-02-07: qty 2

## 2019-02-07 MED ORDER — FLUCYTOSINE 250 MG PO CAPS
25.0000 mg/kg | ORAL_CAPSULE | Freq: Four times a day (QID) | ORAL | Status: DC
  Administered 2019-02-07 – 2019-03-04 (×92): 2500 mg via ORAL
  Filled 2019-02-07 (×98): qty 10
  Filled 2019-02-07: qty 5
  Filled 2019-02-07 (×5): qty 10

## 2019-02-07 MED ORDER — SODIUM CHLORIDE 0.9 % IV BOLUS FOR AMBISOME
500.0000 mL | INTRAVENOUS | Status: DC
  Administered 2019-02-07 – 2019-02-16 (×10): 500 mL via INTRAVENOUS

## 2019-02-07 MED ORDER — ATOVAQUONE 750 MG/5ML PO SUSP
750.0000 mg | Freq: Every day | ORAL | Status: DC
  Administered 2019-02-07 – 2019-02-10 (×2): 750 mg via ORAL
  Filled 2019-02-07 (×3): qty 5

## 2019-02-07 MED ORDER — DEXTROSE 5% FOR FLUSHING BEFORE AND AFTER AMBISOME
10.0000 mL | INTRAVENOUS | Status: DC
  Administered 2019-02-07 – 2019-02-17 (×12): 10 mL via INTRAVENOUS
  Filled 2019-02-07 (×15): qty 50

## 2019-02-07 MED ORDER — ENOXAPARIN SODIUM 40 MG/0.4ML ~~LOC~~ SOLN
40.0000 mg | SUBCUTANEOUS | Status: DC
  Administered 2019-02-07: 40 mg via SUBCUTANEOUS
  Filled 2019-02-07: qty 0.4

## 2019-02-07 MED ORDER — LIDOCAINE HCL (PF) 1 % IJ SOLN
10.0000 mL | Freq: Once | INTRAMUSCULAR | Status: AC
  Administered 2019-02-07: 10 mL
  Filled 2019-02-07: qty 10

## 2019-02-07 MED ORDER — MEPERIDINE HCL 25 MG/ML IJ SOLN
25.0000 mg | INTRAMUSCULAR | Status: DC | PRN
  Filled 2019-02-07: qty 1

## 2019-02-07 MED ORDER — DEXTROSE 5% FOR FLUSHING BEFORE AND AFTER AMBISOME
10.0000 mL | INTRAVENOUS | Status: DC
  Administered 2019-02-08 – 2019-02-17 (×10): 10 mL via INTRAVENOUS
  Filled 2019-02-07 (×13): qty 50

## 2019-02-07 MED ORDER — DEXTROSE 5 % IV SOLN
3.0000 mg/kg | INTRAVENOUS | Status: DC
  Administered 2019-02-07 – 2019-03-03 (×24): 250 mg via INTRAVENOUS
  Filled 2019-02-07 (×33): qty 62.5

## 2019-02-07 MED ORDER — LORATADINE 10 MG PO TABS
10.0000 mg | ORAL_TABLET | Freq: Every day | ORAL | Status: DC
  Administered 2019-02-08 – 2019-03-06 (×26): 10 mg via ORAL
  Filled 2019-02-07 (×26): qty 1

## 2019-02-07 MED ORDER — ACETAMINOPHEN 500 MG PO TABS
500.0000 mg | ORAL_TABLET | Freq: Four times a day (QID) | ORAL | Status: DC | PRN
  Administered 2019-02-07: 500 mg via ORAL
  Filled 2019-02-07: qty 1

## 2019-02-07 MED ORDER — SODIUM CHLORIDE 0.9 % IV BOLUS FOR AMBISOME
500.0000 mL | INTRAVENOUS | Status: DC
  Administered 2019-02-08 – 2019-02-24 (×17): 500 mL via INTRAVENOUS

## 2019-02-07 NOTE — Telephone Encounter (Signed)
I received a phone call from his wife Tara-she related that fungal test was positive and that they are taking him to [facility], ER for further evaluation possible admission.  Electronic records reveals positive cryptococcus antigen.  It is possible that his headache neck pain and vision issues is due to cryptococcal meningitis.  She will keep [place] posted with his progress.  It is possible that patient may need infectious disease consult as well.

## 2019-02-07 NOTE — Progress Notes (Signed)
Pharmacy Antibiotic Note  David Whitehead is a 48 y.o. 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 Pharmacy has been consulted for amphotericin B liposome dosing. Ambisome 3 mg/kg and flucytosine 25 mg/kg started in this patient with suspected cryptococcal meningitis. NS 532mL ordered to be given an hour before and an hour after the Ambisome infusion with D5 flushes administered inbetween. The pharmacy team will monitor the patients renal function, potassium and magnesium and make adjustments per protocol.  Plan: Ambisome 250 mg q24h (3 mg/kg) Flucytosine 2,500 mg q6h (25 mg/kg) Replace Mg and K as needed Monitor Scr   Height: 5\' 10"  (177.8 cm) Weight: 211 lb (95.7 kg) IBW/kg (Calculated) : 73  Temp (24hrs), Avg:99.2 F (37.3 C), Min:99.2 F (37.3 C), Max:99.2 F (37.3 C)  No results for input(s): WBC, CREATININE, LATICACIDVEN, VANCOTROUGH, VANCOPEAK, VANCORANDOM, GENTTROUGH, GENTPEAK, GENTRANDOM, TOBRATROUGH, TOBRAPEAK, TOBRARND, AMIKACINPEAK, AMIKACINTROU, AMIKACIN in the last 168 hours.  Estimated Creatinine Clearance: 98.5 mL/min (by C-G formula based on SCr of 1.1 mg/dL).    Allergies  Allergen Reactions  . Bactrim [Sulfamethoxazole-Trimethoprim] Rash  . Latex Other (See Comments)    Took skin off when removed tape  . Amoxicillin Rash    Has patient had a PCN reaction causing immediate rash, facial/tongue/throat swelling, SOB or lightheadedness with hypotension: No Has patient had a PCN reaction causing severe rash involving mucus membranes or skin necrosis: No Has patient had a PCN reaction that required hospitalization: No Has patient had a PCN reaction occurring within the last 10 years: No If all of the above answers are "NO", then may proceed with Cephalosporin use.     Antimicrobials this admission: Ambisome  9/28>> Flucytosine 9/28>>   Microbiology results: 9/28 BCx: sent 9/28 CSFcx: sent   Thank you for allowing pharmacy to be a part of this  patient's care.  Phillis Haggis 02/07/2019 3:31 PM

## 2019-02-07 NOTE — ED Notes (Signed)
ED Provider at bedside. 

## 2019-02-07 NOTE — Consult Note (Signed)
[place] for Infectious Disease    Date of Admission:  02/07/2019     Total days of antibiotics 1  Flucytosine  L-ampho-B                 Reason for Consult: Cryptococcal Meningitis     Referring Provider: Tegler  Primary Care Provider: [name], MD     Assessment: Yorel Obarr is a 48 y.o. 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 Will begin L-amphotericin and Flucytosine x minimum 2 weeks. His opening pressure was quite elevated - he will need daily LPs to ensure that intracranial pressure is decreasing. He has a history of working in [employer] and a pulmonary nodule over the summer that was discovered; could be the primary source of this infection as it is often acquired through inhalation.   While on antimicrobials will need daily BMPs with Magnesium and replacement. 500 cc NS pre- and post-L-Ampho infusion to help reduce nephrotoxic effect.   Check HIV Ab (risk factors seem low but has not had a screen to his knowledge previously).    Would likely need to hold any further Imbruvica for now with active infection.    Plan: 1. Start Flucytosine and L-amphotericin B 2. Aggressive monitoring and repletion of potassium and magnesium with daily monitoring 3. Pre-/Pose- NS IVF hydration to continue with duration of L-ampho 4. OK to place PICC line as soon as able  5. Will need repeated LPs daily until normal opening pressure x 2 consecutive days - please document in procedure note      Principal Problem:   Cryptococcal meningitis ([place]) Active Problems:   CLL (chronic lymphocytic leukemia) ([facility])   Pulmonary nodule   . dextrose  10 mL Intravenous Q24H  . dextrose  10 mL Intravenous Q24H  . flucytosine  25 mg/kg Oral Q6H  . lidocaine (PF)  10 mL Infiltration Once  . sodium chloride  500 mL Intravenous Q24H  . sodium chloride  500 mL  Intravenous Q24H    HPI: Dewayne Kok is a 48 y.o. male admitted to the hospital after LP done on 9/26 revealed + Crytptococcal Ag titer 1:64 in the setting of 5 week history of severe headaches, 2 weeks of double vision with associated tinnitus of the left ear and neck stiffness.   PMHx significant for CLL, Hypertension, OSA on CPAP. Recently in January of 2020 he started Imbruvica for his CLL which has offered him excellent results with regards to his disease; WBC > 200,000 at onset of tx and has been steadily dropping to < 30K now.   Over the summer it was noted he had a pulmonary nodule on CT scan that was new - saw Dr. Linus Salmons and pulmonary for this and on follow up CT scan 3 months later appeared to be improved. He lives with his wife locally in [place]. Does not smoke or drink alcohol or use drugs. Has not had any travel history this year with COVID-19 and concerns over his health. He was working in [employer] up until he began chemo at the beginning of this year but has remained out of work since that time.   He noticed immediate improvement in his headaches/vision changes after his large volume LP on Friday of last week. Since then his headaches and vision changes have returned and now new since Saturday a loud  ringing in his left ear. Headaches are described to be all over cranium with tenderness on the scalp/top of head and forehead. He has had no fevers/chills or cough. No dysuria or GI symptoms.    Review of Systems: Review of Systems  Constitutional: Negative for chills and fever.  HENT: Negative for tinnitus.   Eyes: Positive for blurred vision and double vision. Negative for photophobia.  Respiratory: Negative for cough and sputum production.   Cardiovascular: Negative for chest pain.  Gastrointestinal: Negative for diarrhea, nausea and vomiting.  Genitourinary: Negative for dysuria.  Musculoskeletal: Negative for back pain and neck pain.  Skin: Negative for rash.   Neurological: Positive for headaches. Negative for tingling, speech change, focal weakness, seizures and loss of consciousness.    Past Medical History:  Diagnosis Date  . Cancer ([place])   . Chronic headache   . CLL (chronic lymphocytic leukemia) ([place])   . Diplopia   . Hypertension   . Leukocytosis 04/01/2018  . Sleep apnea    uses a cpap    Social History   Tobacco Use  . Smoking status: Never Smoker  . Smokeless tobacco: Never Used  Substance Use Topics  . Alcohol use: Not Currently  . Drug use: Never    Family History  Problem Relation Age of Onset  . Colon cancer Father 22       passed from [place]  . Colon cancer Paternal Uncle 85       passed from [facility]   Allergies  Allergen Reactions  . Bactrim [Sulfamethoxazole-Trimethoprim] Rash  . Latex Other (See Comments)    Took skin off when removed tape  . Amoxicillin Rash    Has patient had a PCN reaction causing immediate rash, facial/tongue/throat swelling, SOB or lightheadedness with hypotension: No Has patient had a PCN reaction causing severe rash involving mucus membranes or skin necrosis: No Has patient had a PCN reaction that required hospitalization: No Has patient had a PCN reaction occurring within the last 10 years: No If all of the above answers are "NO", then may proceed with Cephalosporin use.     OBJECTIVE: Blood pressure (!) 130/102, pulse 80, temperature 99.2 F (37.3 C), temperature source Oral, resp. rate 20, height 5\' 10"  (1.778 m), weight 95.7 kg, SpO2 100 %.  Physical Exam Constitutional:      Comments: Sitting upright in stretcher. In no distress.   HENT:     Ears:     Comments: Tinnitus on the left ear reported. Has adequate hearing otherwise  Eyes:     General: No scleral icterus.    Extraocular Movements: Extraocular movements intact.     Pupils: Pupils are equal, round, and reactive to light.     Comments: Photosensitivity - squinting with bright lights   Neck:     Musculoskeletal:  Muscular tenderness present.  Cardiovascular:     Rate and Rhythm: Normal rate and regular rhythm.     Heart sounds: No murmur.  Pulmonary:     Effort: Pulmonary effort is normal.     Breath sounds: Normal breath sounds. No rhonchi or rales.  Abdominal:     General: Abdomen is flat. There is no distension.  Musculoskeletal: Normal range of motion.  Lymphadenopathy:     Cervical: No cervical adenopathy.  Skin:    General: Skin is warm and dry.     Capillary Refill: Capillary refill takes less than 2 seconds.  Neurological:     General: No focal deficit present.  Mental Status: He is alert and oriented to person, place, and time.     Cranial Nerves: No cranial nerve deficit.     Sensory: No sensory deficit.     Motor: No weakness.     Coordination: Coordination normal.  Psychiatric:        Mood and Affect: Mood normal.     Lab Results Lab Results  Component Value Date   WBC 13.2 (H) 02/07/2019   HGB 14.9 02/07/2019   HCT 45.9 02/07/2019   MCV 83.6 02/07/2019   PLT 255 02/07/2019    Lab Results  Component Value Date   CREATININE 0.97 02/07/2019   BUN 9 02/07/2019   NA 137 02/07/2019   K 3.7 02/07/2019   CL 101 02/07/2019   CO2 26 02/07/2019    Lab Results  Component Value Date   ALT 27 02/07/2019   AST 14 (L) 02/07/2019   ALKPHOS 30 (L) 02/07/2019   BILITOT 0.4 02/07/2019     Microbiology: Recent Results (from the past 240 hour(s))  CSF culture     Status: None (Preliminary result)   Collection Time: 02/04/19  9:30 AM   Specimen: Lumbar Puncture; Cerebrospinal Fluid  Result Value Ref Range Status   MICRO NUMBER: [id number]  Preliminary   SPECIMEN QUALITY: Adequate  Preliminary   Source CEREBROSPINAL FLUID (CSF)  Preliminary   STATUS: PRELIMINARY  Preliminary   GRAM STAIN:   Preliminary    Many White blood cells seen No organisms seen Gram stain prepared by cytospin   Result: No growth to date  Preliminary   MYCOBACTERIA, CULTURE, WITH FLUOROCHROME SMEAR      Status: None (Preliminary result)   Collection Time: 02/04/19  9:30 AM  Result Value Ref Range Status   MICRO NUMBER: [id number]  Preliminary   SPECIMEN QUALITY: Adequate  Preliminary   Source: CEREBROSPINAL FLUID (CSF)  Preliminary   STATUS: PRELIMINARY  Preliminary   SMEAR: No acid fast bacilli seen.  Preliminary   RESULT:   Preliminary    Culture results to follow. Final reports of negative cultures can be expected in approximately six weeks. Positive cultures are reported immediately.    Janene Madeira, MSN, NP-C [facility] [place] Cell: [phone] Pager: [phone]  02/07/2019 4:37 PM

## 2019-02-07 NOTE — H&P (Signed)
History and Physical:    David Whitehead   [id number] DOB: March 19, 1971 DOA: 02/07/2019  Referring MD/provider: PA Aldona Bar PCP: [name], MD   Patient coming from: Home  Chief Complaint: Positive cryptococcal titers on lumbar puncture done earlier this week.  History of Present Illness:   David Whitehead is an 48 y.o. male past medical history significant for CLL, HTN, DM 2, who underwent an outpatient work-up for subacute headache and diplopia as an outpatient.  Patient states that he was in his usual state of health until about 5 weeks ago when he developed a low-grade headache that was around his scalp.  Patient states that he normally knows what his headaches would come from whether it was a sinus headache or from being out in the sun too much but this headache felt different.  He went to see his PCP who noted that he had hypertension and started him on medications.  Patient states he felt worse on the medications and felt that his blood pressure was higher so he stopped taking them.  2 weeks ago patient states that he realized that he had suddenly developed double vision when he went to kill a snake with a rock.  Patient notes that he sees the same image side-by-side and that if he closes either eye he has monocular vision. Patient underwent a CT scan and an MRI both of which showed no acute findings.  Patient was referred to neurology and was seen by Dr. Felecia Shelling earlier this week who noted no acute neuro focal findings other than diplopia.  Patient underwent a lumbar puncture under fluoroscopy and was found to have an elevated opening pressure at 55.  Of note patient noted immediate improvement in headaches after his LP last week. Today cryptococcal antigen titers came back positive at 1-64 and patient was instructed to come to ED for admission.  Patient today states he feels no different than usual.  He notes that his headaches are temporarily improved after his initial lumbar  puncture but they have returned.  No improvement in diplopia since his LP.  He thinks he may have developed intermittent tinnitus since then however.  Patient denies any fevers or chills.  No nausea vomiting or diarrhea.  Admits to feeling somewhat frightened.  But he is glad that he is in the hospital getting medications.  ED Course:  The patient was seen by neurology and by ID.  He was started on lyophilized amphotericin and flucytosine.  He was noted by ID to have a pulmonary nodule which may or may not be the source of the cryptococcus.  ROS:   ROS   Review of Systems: General: No fever, chills, weight changes Endocrine: no heat/cold intolerance, no polyuria Respiratory: No cough,, shortness of breath, hemoptysis Cardiovascular: No palpitations, chest pain GI: No nausea, vomiting, diarrhea, constipation GU: No dysuria, increased frequency CNS: No numbness, dizziness, headache Musculoskeletal: No back pain, joint pain Blood/lymphatics: No easy bruising, bleeding Mood/affect: No anxiety/depression    Past Medical History:   Past Medical History:  Diagnosis Date  . Cancer ([place])   . Chronic headache   . CLL (chronic lymphocytic leukemia) ([place])   . Diplopia   . Hypertension   . Leukocytosis 04/01/2018  . Sleep apnea    uses a cpap    Past Surgical History:   Past Surgical History:  Procedure Laterality Date  . AXILLARY LYMPH NODE DISSECTION Right 04/16/2018   Procedure: AXILLARY LYMPH NODE DEEP EXCISION;  Surgeon: Fanny Skates,  MD;  Location: [place];  Service: General;  Laterality: Right;  . FINGER SURGERY      Social History:   Social History   Socioeconomic History  . Marital status: Married    Spouse name: [name]  . Number of children: 0  . Years of education: 74  . Highest education level: Not on file  Occupational History  . Not on file  Social Needs  . Financial resource strain: Not on file  . Food insecurity    Worry: Not on file    Inability: Not on  file  . Transportation needs    Medical: Not on file    Non-medical: Not on file  Tobacco Use  . Smoking status: Never Smoker  . Smokeless tobacco: Never Used  Substance and Sexual Activity  . Alcohol use: Not Currently  . Drug use: Never  . Sexual activity: Yes  Lifestyle  . Physical activity    Days per week: Not on file    Minutes per session: Not on file  . Stress: Not on file  Relationships  . Social Herbalist on phone: Not on file    Gets together: Not on file    Attends religious service: Not on file    Active member of club or organization: Not on file    Attends meetings of clubs or organizations: Not on file    Relationship status: Not on file  . Intimate partner violence    Fear of current or ex partner: Not on file    Emotionally abused: Not on file    Physically abused: Not on file    Forced sexual activity: Not on file  Other Topics Concern  . Not on file  Social History Narrative   Right handed    Lives with wife, [name]   Caffeine use: rare    Allergies   Bactrim [sulfamethoxazole-trimethoprim], Latex, and Amoxicillin  Family history:   Family History  Problem Relation Age of Onset  . Colon cancer Father 44       passed from [place]  . Colon cancer Paternal Uncle 25       passed from [facility]    Current Medications:   Prior to Admission medications   Medication Sig Start Date End Date Taking? Authorizing Provider  acetaminophen (TYLENOL) 500 MG tablet Take 500 mg by mouth every 6 (six) hours as needed for mild pain.   Yes [provider]  acyclovir (ZOVIRAX) 400 MG tablet Take 400 mg by mouth 2 (two) times daily.    Yes [provider]  atovaquone (MEPRON) 750 MG/5ML suspension Take 750 mg by mouth daily with breakfast.  12/20/18  Yes [provider]  cetirizine (ZYRTEC) 10 MG tablet Take 10 mg by mouth daily.   Yes [provider]  hydrOXYzine (ATARAX/VISTARIL) 10 MG tablet Take 10 mg by mouth 2 (two) times  daily as needed. 08/04/18  Yes [provider]  meloxicam (MOBIC) 15 MG tablet Take 1 tablet (15 mg total) by mouth daily. 02/02/19  Yes Sater, Nanine Means, MD  Ibrutinib (IMBRUVICA) 420 MG TABS Take by mouth daily.    [provider]    Physical Exam:   Vitals:   02/07/19 1337 02/07/19 1344 02/07/19 1515  BP: 138/75  (!) 130/102  Pulse: 100  80  Resp: 18  20  Temp: 99.2 F (37.3 C)    TempSrc: Oral    SpO2: 97%  100%  Weight:  95.7 kg  Height:  5\' 10"  (1.778 m)      Physical Exam: Blood pressure (!) 130/102, pulse 80, temperature 99.2 F (37.3 C), temperature source Oral, resp. rate 20, height 5\' 10"  (1.778 m), weight 95.7 kg, SpO2 100 %. Gen: Friendly relatively well-appearing patient sitting up in bed in no acute distress. Eyes: Sclerae anicteric. Conjunctiva mildly injected. Chest: Moderately good air entry bilaterally with no adventitious sounds.  CV: Distant, regular, no audible murmurs. Abdomen: NABS, soft, nondistended, nontender. No tenderness to light or deep palpation. No rebound, no guarding. Extremities: No edema.  Skin: Warm and dry. No rashes, lesions or wounds. Psych: Patient is cooperative, logical and coherent with appropriate mood and affect.  Data Review:    Labs: Basic Metabolic Panel: Recent Labs  Lab 02/07/19 1520  NA 137  K 3.7  CL 101  CO2 26  GLUCOSE 103*  BUN 9  CREATININE 0.97  CALCIUM 8.8*   Liver Function Tests: Recent Labs  Lab 02/04/19 0930 02/07/19 1520  AST  --  14*  ALT  --  27  ALKPHOS  --  30*  BILITOT  --  0.4  PROT  --  6.1*  ALBUMIN 4.8 3.9   No results for input(s): LIPASE, AMYLASE in the last 168 hours. No results for input(s): AMMONIA in the last 168 hours. CBC: Recent Labs  Lab 02/07/19 1520  WBC 13.2*  NEUTROABS 4.5  HGB 14.9  HCT 45.9  MCV 83.6  PLT 255   Cardiac Enzymes: No results for input(s): CKTOTAL, CKMB, CKMBINDEX, TROPONINI in the last 168 hours.  BNP (last 3 results) No  results for input(s): PROBNP in the last 8760 hours. CBG: No results for input(s): GLUCAP in the last 168 hours.  Urinalysis No results found for: COLORURINE, APPEARANCEUR, LABSPEC, PHURINE, GLUCOSEU, HGBUR, BILIRUBINUR, KETONESUR, PROTEINUR, UROBILINOGEN, NITRITE, LEUKOCYTESUR    Radiographic Studies: No results found.  EKG: Independently reviewed.  Ordered and pending.   Assessment/Plan:   Principal Problem:   Cryptococcal meningitis (HCC) Active Problems:   CLL (chronic lymphocytic leukemia) (HCC)   Pulmonary nodule   Hypertension   48 year old man with CLL on ibrutinib has had a headache for 5 weeks, diplopia for 2 weeks.  CT and MRI negative for acute changes.  CRYPTOCOCCAL MENINGITIS Patient was seen by ID in the ER and was started on flucytosine and lyophilized amphotericin B.   They recommend aggressive monitoring and repletion of potassium and magnesium. Also recommend pre-and post Amphotericin infusion with 500 cc normal saline--has been ordered by them. They also recommend daily repeated LPs until normal putting pressures x2.  Pressures  Hold ibrutinib per ID recommendations for now.  CLL Unclear how long ibrutinib needs to be held. To call hematology oncology however Dr. Annamaria Boots who is on consult today recommended that we contact patient's personal hematologist Dr. Valerie Salts as they like to follow their own patients.  Unfortunately Dr. Ronny Flurry is off as it is in the evening, he can be contacted in the morning.  Continue atovaquone for PCP prophylaxis, patient is allergic to Bactrim. Continue acyclovir for zoster prophylaxis  HTN Was started on HCTZ and amlodipine 2.5 by his PCP last month however patient felt much worse on this medication so stopped taking these. His blood pressure is elevated here today. Will add metoprolol 12.5 twice daily and follow.  PULMONARY NODULE  To be possibly source of his cryptococcus per ID.   Other information:   DVT  prophylaxis: Lovenox ordered. Code Status: Full code. Family Communication:  Patient states no need to contact family Disposition Plan: Home Consults called: ID, hematology Admission status: Inpatient  The medical decision making on this patient was of high complexity and the patient is at high risk for clinical deterioration, therefore this is a level 3 visit.   Dewaine Oats Tublu Chatterjee [facility]  If 7PM-7AM, please contact night-coverage [email or web address] Password [facility] 02/07/2019, 4:50 PM

## 2019-02-07 NOTE — ED Notes (Signed)
Consent has been collected for LP procedure.

## 2019-02-07 NOTE — ED Notes (Signed)
Infectious Disease MD at bedside.

## 2019-02-07 NOTE — ED Provider Notes (Signed)
[facility] EMERGENCY DEPARTMENT Provider Note   CSN: [id number] Arrival date & time: 02/07/19  1320     History   Chief Complaint Chief Complaint  Patient presents with   Fungal Infection of Spine    HPI David Whitehead is a 48 y.o. 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 Patient reports complaints of headache x 5 weeks, states pain dull in nature & is mostly to the frontal region but is generalized & radiates into the neck w/ neck stiffness. Pain worse w/ bright lights & loud noises, somewhat alleviated by tylenol. Has also had diplopia intermittently but fairly frequently for the past 2 weeks which he thinks is in both eyes. He had outpatient CT 01/25/19 which was normal & MRI of the brain 01/31/19 w/ mild white matter foci but no acute findings, ultimately referred to neurology, had outpatient LP done- findings came back positive for cryptococcal antigen, titer 1:64.  He was called by neurology and instructed to go to the emergency department for further management and admission.  He denies acute change in his symptoms since his last LP.  He denies dizziness, numbness, tingling, focal weakness, vomiting, or fevers.      HPI  Past Medical History:  Diagnosis Date   Cancer ([place])    Chronic headache    CLL (chronic lymphocytic leukemia) ([place])    Diplopia    Hypertension    Leukocytosis 04/01/2018   Sleep apnea    uses a cpap    Patient Active Problem List   Diagnosis Date Noted   White matter abnormality on MRI of brain 02/02/2019   Iron deficiency 01/20/2019   Leukocytosis 10/22/2018   Pulmonary nodule 08/25/2018   CLL (chronic lymphocytic leukemia) ([place]) 04/01/2018   Dry mouth 03/31/2018   Sleep apnea in adult 03/31/2018    Past Surgical History:  Procedure  Laterality Date   AXILLARY LYMPH NODE DISSECTION Right 04/16/2018   Procedure: AXILLARY LYMPH NODE DEEP EXCISION;  Surgeon: Fanny Skates, MD;  Location: [place];  Service: General;  Laterality: Right;   FINGER SURGERY          Home Medications    Prior to Admission medications   Medication Sig Start Date End Date Taking? Authorizing Provider  acyclovir (ZOVIRAX) 400 MG tablet Take 400 mg by mouth 2 (two) times daily.     [provider]  amLODipine (NORVASC) 2.5 MG tablet Take 1 tablet (2.5 mg total) by mouth daily. 01/29/19   [name], MD  atovaquone (MEPRON) 750 MG/5ML suspension Take 750 mg by mouth daily with breakfast.  12/20/18   [provider]  hydrochlorothiazide (HYDRODIURIL) 25 MG tablet Take 1 tablet (25 mg total) by mouth daily. 01/26/19   [name], MD  hydrOXYzine (ATARAX/VISTARIL) 10 MG tablet Take 10 mg by mouth 2 (two) times daily as needed. 08/04/18   [provider]  Ibrutinib (IMBRUVICA) 420 MG TABS Take by mouth daily.    [provider]  meloxicam (MOBIC) 15 MG tablet Take 1 tablet (15 mg total) by mouth daily. 02/02/19   Sater, Nanine Means, MD  potassium chloride SA (K-DUR) 20 MEQ tablet Take 1 tablet (20 mEq total) by mouth daily. 01/26/19   [name], MD  tiZANidine (ZANAFLEX) 4 MG tablet Take 1 tablet (4 mg  total) by mouth every 6 (six) hours as needed for muscle spasms. 01/26/19   [name], MD    Family History Family History  Problem Relation Age of Onset   Colon cancer Father 69       passed [facility]   Colon cancer Paternal Uncle 31       passed from [facility]    Social History Social History   Tobacco Use   Smoking status: Never Smoker   Smokeless tobacco: Never Used  Substance Use Topics   Alcohol use: Not Currently   Drug use: Never     Allergies   Bactrim [sulfamethoxazole-trimethoprim], Latex, and Amoxicillin   Review of Systems Review of Systems  Constitutional: Negative for  fever.  HENT: Negative for congestion, ear pain and sore throat.   Respiratory: Negative for stridor.   Cardiovascular: Negative for chest pain.  Gastrointestinal: Negative for abdominal pain and vomiting.  Musculoskeletal: Positive for neck stiffness.  Neurological: Positive for headaches. Negative for dizziness, seizures, syncope, facial asymmetry, speech difficulty, weakness and numbness.  All other systems reviewed and are negative.  Physical Exam Updated Vital Signs BP 138/75 (BP Location: Right Arm)    Pulse 100    Temp 99.2 F (37.3 C) (Oral)    Resp 18    Ht 5\' 10"  (1.778 m)    Wt 95.7 kg    SpO2 97%    BMI 30.28 kg/m   Physical Exam Vitals signs and nursing note reviewed.  Constitutional:      General: He is not in acute distress.    Appearance: He is well-developed. He is not toxic-appearing.  HENT:     Head: Normocephalic and atraumatic.     Mouth/Throat:     Comments: Uvula midline.  Eyes:     General: Vision grossly intact.        Right eye: No discharge.        Left eye: No discharge.     Extraocular Movements: Extraocular movements intact.     Conjunctiva/sclera: Conjunctivae normal.     Pupils: Pupils are equal, round, and reactive to light.  Neck:     Musculoskeletal: Neck supple. No neck rigidity.  Cardiovascular:     Rate and Rhythm: Normal rate and regular rhythm.  Pulmonary:     Effort: Pulmonary effort is normal. No respiratory distress.     Breath sounds: Normal breath sounds. No wheezing, rhonchi or rales.  Abdominal:     General: There is no distension.     Palpations: Abdomen is soft.     Tenderness: There is no abdominal tenderness.  Skin:    General: Skin is warm and dry.     Findings: No rash.  Neurological:     Mental Status: He is alert.     Comments: Alert. Clear speech. No facial droop. CNIII-XII grossly intact. Bilateral upper and lower extremities' sensation grossly intact. 5/5 symmetric strength with grip strength and with plantar and  dorsi flexion bilaterally.. Normal finger to nose bilaterally. Negative pronator drift.    Psychiatric:        Behavior: Behavior normal.      ED Treatments / Results  Labs (all labs ordered are listed, but only abnormal results are displayed) Labs Reviewed  CULTURE, BLOOD (ROUTINE X 2)  CULTURE, BLOOD (ROUTINE X 2)  COMPREHENSIVE METABOLIC PANEL  LACTIC ACID, PLASMA  LACTIC ACID, PLASMA  CBC WITH DIFFERENTIAL/PLATELET  PROTIME-INR  URINALYSIS, ROUTINE W REFLEX MICROSCOPIC    EKG None  Radiology  No results found.  Procedures .Lumbar Puncture  Date/Time: 02/07/2019 5:45 PM Performed by: Amaryllis Dyke, PA-C Authorized by: Amaryllis Dyke, PA-C   Consent:    Consent obtained:  Verbal and written   Consent given by:  Patient   Risks discussed:  Bleeding, headache, nerve damage, infection, repeat procedure and pain   Alternatives discussed:  No treatment Pre-procedure details:    Procedure purpose: diagnostic & therapeutic.   Preparation: Patient was prepped and draped in usual sterile fashion   Anesthesia (see MAR for exact dosages):    Anesthesia method:  Local infiltration   Local anesthetic:  Lidocaine 1% w/o epi Procedure details:    Lumbar space:  L4-L5 interspace   Patient position:  R lateral decubitus   Epidural needle gauge: 22 & 24.   Ultrasound guidance: no     Number of attempts:  3 (Unsuccessful) Post-procedure:    Puncture site:  Adhesive bandage applied   Patient tolerance of procedure:  Tolerated well, no immediate complications Comments:     Procedure attempted with supervising physician Dr. Sherry Ruffing.  .Critical Care Performed by: Amaryllis Dyke, PA-C Authorized by: Amaryllis Dyke, PA-C    CRITICAL CARE Performed by: Kennith Maes   Total critical care time: 30 minutes  Critical care time was exclusive of separately billable procedures and treating other patients.  Critical care was necessary to  treat or prevent imminent or life-threatening deterioration.  Critical care was time spent personally by me on the following activities: development of treatment plan with patient and/or surrogate as well as nursing, discussions with consultants, evaluation of patient's response to treatment, examination of patient, obtaining history from patient or surrogate, ordering and performing treatments and interventions, ordering and review of laboratory studies, ordering and review of radiographic studies, pulse oximetry and re-evaluation of patient's condition.   including critical care time)  Medications Ordered in ED Medications  flucytosine (ANCOBON) capsule 2,500 mg (has no administration in time range)  acetaminophen (TYLENOL) tablet 650 mg (has no administration in time range)  diphenhydrAMINE (BENADRYL) injection 25 mg (25 mg Intravenous Given 02/07/19 1639)    Or  diphenhydrAMINE (BENADRYL) capsule 25 mg ( Oral See Alternative 02/07/19 1639)  meperidine (DEMEROL) injection 25 mg (has no administration in time range)  sodium chloride 0.9 % bolus 500 mL (500 mLs Intravenous Given 02/07/19 1644)  dextrose 5 % 10 mL (has no administration in time range)  amphotericin B liposome (AMBISOME) 250 mg in dextrose 5 % 500 mL IVPB (250 mg Intravenous New Bag/Given 02/07/19 1658)  dextrose 5 % 10 mL (has no administration in time range)  sodium chloride 0.9 % bolus 500 mL (has no administration in time range)  acetaminophen (TYLENOL) tablet 500 mg (has no administration in time range)  loratadine (CLARITIN) tablet 10 mg (has no administration in time range)  enoxaparin (LOVENOX) injection 40 mg (has no administration in time range)  0.9 %  sodium chloride infusion (has no administration in time range)  acyclovir (ZOVIRAX) tablet 400 mg (has no administration in time range)  atovaquone (MEPRON) 750 MG/5ML suspension 750 mg (has no administration in time range)  lidocaine (PF) (XYLOCAINE) 1 % injection 10 mL  (10 mLs Infiltration Given 02/07/19 1643)     Initial Impression / Assessment and Plan / ED Course  I have reviewed the triage vital signs and the nursing notes.  Pertinent labs & imaging results that were available during my care of the patient were reviewed by me and considered  in my medical decision making (see chart for details).   Patient presents to the emergency department with 5 weeks of headache and 2 weeks of diplopia, has had outpatient CT imaging that was negative 09/15 & MRI of the brain 9/21 with mild white matter foci but no acute findings, ultimately had lumbar puncture performed outpatient which revealed elevated opening pressure & findings consistent with cryptococcal meningitis and was sent to the emergency department for further management and admission.  On exam patient is overall well-appearing, oral temp is 99.2, vitals WNL.  He has no focal neurologic deficits.  Labs have been ordered.  Discussed with neurologist Dr. Leonel Ramsay who recommends discussion with infectious disease--> Discussed w/ infectious disease specialist Dr. Comer--> ID team will see patient & place orders for anti-fungals, recommending repeat LP in the ED given patient's elevated opening pressure during last LP if unable/unsuccessful defer to inpatient team.   Labs reviewed- notable for leukocytosis @ 13.2, lactic acid WNL, otherwise fairly unremarkable.   LP was attempted with supervising physician Dr. Sherry Ruffing & was unfortunately unscessful- will defer to inpatient team.   Discussed w/ hospitalist Dr. Jamse Arn who accepts admission.    Final Clinical Impressions(s) / ED Diagnoses   Final diagnoses:  Cryptococcal meningitis [facility])    ED Discharge Orders    None       Amaryllis Dyke, PA-C 02/07/19 1753    Tegeler, Gwenyth Allegra, MD 02/07/19 [phone]

## 2019-02-07 NOTE — ED Triage Notes (Signed)
Pt arrives POV sent by neurologist for eval of fungal infection. Pt reports that he started w/ HA and double vision almost 5 weeks ago, finally had MR and LP this week and was notified that LP was positive for fungal infection. Sent here for admission and further eval

## 2019-02-08 ENCOUNTER — Inpatient Hospital Stay (HOSPITAL_COMMUNITY): Payer: BC Managed Care – PPO

## 2019-02-08 ENCOUNTER — Inpatient Hospital Stay: Payer: Self-pay

## 2019-02-08 DIAGNOSIS — Z79899 Other long term (current) drug therapy: Secondary | ICD-10-CM

## 2019-02-08 LAB — COMPREHENSIVE METABOLIC PANEL
ALT: 23 U/L (ref 0–44)
AST: 14 U/L — ABNORMAL LOW (ref 15–41)
Albumin: 3.5 g/dL (ref 3.5–5.0)
Alkaline Phosphatase: 28 U/L — ABNORMAL LOW (ref 38–126)
Anion gap: 10 (ref 5–15)
BUN: 8 mg/dL (ref 6–20)
CO2: 21 mmol/L — ABNORMAL LOW (ref 22–32)
Calcium: 8.8 mg/dL — ABNORMAL LOW (ref 8.9–10.3)
Chloride: 107 mmol/L (ref 98–111)
Creatinine, Ser: 0.84 mg/dL (ref 0.61–1.24)
GFR calc Af Amer: >60 mL/min (ref >60)
GFR calc non Af Amer: >60 mL/min (ref >60)
Glucose, Bld: 120 mg/dL — ABNORMAL HIGH (ref 70–99)
Potassium: 3.7 mmol/L (ref 3.5–5.1)
Sodium: 138 mmol/L (ref 135–145)
Total Bilirubin: 0.9 mg/dL (ref 0.3–1.2)
Total Protein: 5.6 g/dL — ABNORMAL LOW (ref 6.5–8.1)

## 2019-02-08 LAB — HIV ANTIBODY (ROUTINE TESTING W REFLEX): HIV Screen 4th Generation wRfx: NONREACTIVE

## 2019-02-08 LAB — CBC
HCT: 43.9 % (ref 39.0–52.0)
Hemoglobin: 14.5 g/dL (ref 13.0–17.0)
MCH: 27.2 pg (ref 26.0–34.0)
MCHC: 33 g/dL (ref 30.0–36.0)
MCV: 82.2 fL (ref 80.0–100.0)
Platelets: 240 10*3/uL (ref 150–400)
RBC: 5.34 MIL/uL (ref 4.22–5.81)
RDW: 13.6 % (ref 11.5–15.5)
WBC: 12.3 10*3/uL — ABNORMAL HIGH (ref 4.0–10.5)
nRBC: 0 % (ref 0.0–0.2)

## 2019-02-08 LAB — MAGNESIUM: Magnesium: 2.1 mg/dL (ref 1.7–2.4)

## 2019-02-08 MED ORDER — CHLORHEXIDINE GLUCONATE CLOTH 2 % EX PADS
6.0000 | MEDICATED_PAD | Freq: Every day | CUTANEOUS | Status: DC
  Administered 2019-02-08 – 2019-03-06 (×15): 6 via TOPICAL

## 2019-02-08 MED ORDER — POTASSIUM CHLORIDE CRYS ER 20 MEQ PO TBCR
20.0000 meq | EXTENDED_RELEASE_TABLET | Freq: Once | ORAL | Status: AC
  Administered 2019-02-08: 20 meq via ORAL
  Filled 2019-02-08: qty 1

## 2019-02-08 MED ORDER — SODIUM CHLORIDE 0.9% FLUSH
10.0000 mL | INTRAVENOUS | Status: DC | PRN
  Administered 2019-02-16 – 2019-03-01 (×5): 10 mL
  Filled 2019-02-08 (×5): qty 40

## 2019-02-08 NOTE — Progress Notes (Signed)
Patient ID: David Whitehead, male   DOB: 1970/11/13, 48 y.o.   MRN: [id number]  PROGRESS NOTE    David Whitehead  [id number] DOB: 1971/01/17 DOA: 02/07/2019 PCP: [name], MD   Brief Narrative:  48 year old male with history of CLL, hypertension, diabetes mellitus type 2 presented with subacute headache and diplopia.  He was recently evaluated by neurology and subsequently had LP done which showed opening pressure of 55.  His cryptococcal antigen titers came back positive at 1:64.  He was admitted for cryptococcal meningitis.  ID was consulted.  He was started on amphotericin B and flucytosine.  Assessment & Plan:   Cryptococcal meningitis -Patient had a recent outpatient LP which had shown opening pressure of 55 and his cryptococcal antigen titers came back positive at 1:64 -Started on amphotericin B and flucytosine by ID.  Continue the same. -ID recommends daily LPs until normal opening pressures x2 -IR has been consulted: Apparently LP cannot be done today because patient received Lovenox last night.  Will hold Lovenox. -Continue monitoring electrolytes and renal function while using amphotericin B. -Follow further ID recommendations.  CLL -Spoke with Dr. Zhao/oncology on phone on 02/08/2019.  -He is okay for ibrutinib to be on hold for now. -Continue atovaquone and acyclovir.  Hypertension -Monitor blood pressure.  Continue metoprolol  Pulmonary nodule -Possibly source of his cryptococcus as per ID.    DVT prophylaxis: We will hold Lovenox.  SCDs Code Status: Full Family Communication: None at bedside Disposition Plan: We will probably need to be inpatient for at least 2 weeks for IV amphotericin.  Consultants: ID  Procedures: None  Antimicrobials: Amphotericin B and flucytosine from 02/07/2019 onwards Outpatient acyclovir and atovaquone being continued.  Subjective: Patient seen and examined at bedside.  He states that his headache is slightly better this  morning.  No overnight fever or vomiting.  No worsening abdominal pain.  Objective: Vitals:   02/07/19 2116 02/07/19 2136 02/08/19 0349 02/08/19 0850  BP:  132/86 (!) 142/95 133/85  Pulse:  94 69 79  Resp:  16 18 18   Temp: 98.3 F (36.8 C) 98.3 F (36.8 C) 99.3 F (37.4 C) 98.4 F (36.9 C)  TempSrc: Oral Oral Oral Oral  SpO2:  96% 100% 98%  Weight:   94.5 kg   Height:        Intake/Output Summary (Last 24 hours) at 02/08/2019 1125 Last data filed at 02/08/2019 0900 Gross per 24 hour  Intake 826.5 ml  Output 0 ml  Net 826.5 ml   Filed Weights   02/07/19 1344 02/08/19 0349  Weight: 95.7 kg 94.5 kg    Examination:  General exam: Appears calm and comfortable  Respiratory system: Bilateral decreased breath sounds at bases Cardiovascular system: S1 & S2 heard, Rate controlled Gastrointestinal system: Abdomen is nondistended, soft and nontender. Normal bowel sounds heard. Extremities: No cyanosis, clubbing, edema    Data Reviewed: I have personally reviewed following labs and imaging studies  CBC: Recent Labs  Lab 02/07/19 1520 02/08/19 0604  WBC 13.2* 12.3*  NEUTROABS 4.5  --   HGB 14.9 14.5  HCT 45.9 43.9  MCV 83.6 82.2  PLT 255 [id number]   Basic Metabolic Panel: Recent Labs  Lab 02/07/19 1520 02/08/19 0604  NA 137 138  K 3.7 3.7  CL 101 107  CO2 26 21*  GLUCOSE 103* 120*  BUN 9 8  CREATININE 0.97 0.84  CALCIUM 8.8* 8.8*  MG  --  2.1   GFR: Estimated Creatinine Clearance: 128.2  mL/min (by C-G formula based on SCr of 0.84 mg/dL). Liver Function Tests: Recent Labs  Lab 02/04/19 0930 02/07/19 1520 02/08/19 0604  AST  --  14* 14*  ALT  --  27 23  ALKPHOS  --  30* 28*  BILITOT  --  0.4 0.9  PROT  --  6.1* 5.6*  ALBUMIN 4.8 3.9 3.5   No results for input(s): LIPASE, AMYLASE in the last 168 hours. No results for input(s): AMMONIA in the last 168 hours. Coagulation Profile: Recent Labs  Lab 02/07/19 1520  INR 1.0   Cardiac Enzymes: No results  for input(s): CKTOTAL, CKMB, CKMBINDEX, TROPONINI in the last 168 hours. BNP (last 3 results) No results for input(s): PROBNP in the last 8760 hours. HbA1C: No results for input(s): HGBA1C in the last 72 hours. CBG: No results for input(s): GLUCAP in the last 168 hours. Lipid Profile: No results for input(s): CHOL, HDL, LDLCALC, TRIG, CHOLHDL, LDLDIRECT in the last 72 hours. Thyroid Function Tests: No results for input(s): TSH, T4TOTAL, FREET4, T3FREE, THYROIDAB in the last 72 hours. Anemia Panel: No results for input(s): VITAMINB12, FOLATE, FERRITIN, TIBC, IRON, RETICCTPCT in the last 72 hours. Sepsis Labs: Recent Labs  Lab 02/07/19 1520  LATICACIDVEN 1.3    Recent Results (from the past 240 hour(s))  CSF culture     Status: None (Preliminary result)   Collection Time: 02/04/19  9:30 AM   Specimen: Lumbar Puncture; Cerebrospinal Fluid  Result Value Ref Range Status   MICRO NUMBER: [id number]  Preliminary   SPECIMEN QUALITY: Adequate  Preliminary   Source CEREBROSPINAL FLUID (CSF)  Preliminary   STATUS: PRELIMINARY  Preliminary   GRAM STAIN:   Preliminary    Many White blood cells seen No organisms seen Gram stain prepared by cytospin   Result: No growth to date  Preliminary   MYCOBACTERIA, CULTURE, WITH FLUOROCHROME SMEAR     Status: None (Preliminary result)   Collection Time: 02/04/19  9:30 AM  Result Value Ref Range Status   MICRO NUMBER: [id number]  Preliminary   SPECIMEN QUALITY: Adequate  Preliminary   Source: CEREBROSPINAL FLUID (CSF)  Preliminary   STATUS: PRELIMINARY  Preliminary   SMEAR: No acid fast bacilli seen.  Preliminary   RESULT:   Preliminary    Culture results to follow. Final reports of negative cultures can be expected in approximately six weeks. Positive cultures are reported immediately.  Culture, blood (Routine x 2)     Status: None (Preliminary result)   Collection Time: 02/07/19  1:50 PM   Specimen: BLOOD LEFT ARM  Result Value Ref Range Status    Specimen Description BLOOD LEFT ARM  Final   Special Requests   Final    BOTTLES DRAWN AEROBIC AND ANAEROBIC Blood Culture results may not be optimal due to an excessive volume of blood received in culture bottles   Culture   Final    NO GROWTH < 24 HOURS Performed at [facility], [place] [address], [place], [place] [address]    Report Status PENDING  Incomplete  Culture, blood (Routine x 2)     Status: None (Preliminary result)   Collection Time: 02/07/19  3:06 PM   Specimen: BLOOD RIGHT ARM  Result Value Ref Range Status   Specimen Description BLOOD RIGHT ARM  Final   Special Requests   Final    BOTTLES DRAWN AEROBIC AND ANAEROBIC Blood Culture results may not be optimal due to an excessive volume of blood received in culture bottles  Culture   Final    NO GROWTH < 24 HOURS Performed at [facility], [place] [address], [place], [place] [address]    Report Status PENDING  Incomplete  SARS CORONAVIRUS 2 (TAT 6-24 HRS) Nasopharyngeal Nasopharyngeal Swab     Status: None   Collection Time: 02/07/19  3:54 PM   Specimen: Nasopharyngeal Swab  Result Value Ref Range Status   SARS Coronavirus 2 NEGATIVE NEGATIVE Final    Comment: (NOTE) SARS-CoV-2 target nucleic acids are NOT DETECTED. The SARS-CoV-2 RNA is generally detectable in upper and lower respiratory specimens during the acute phase of infection. Negative results do not preclude SARS-CoV-2 infection, do not rule out co-infections with other pathogens, and should not be used as the sole basis for treatment or other patient management decisions. Negative results must be combined with clinical observations, patient history, and epidemiological information. The expected result is Negative. Fact Sheet for Patients: SugarRoll.be Fact Sheet for Healthcare Providers: https://www.woods-mathews.com/ This test is not yet approved or cleared by the [place] FDA and  has been authorized  for detection and/or diagnosis of SARS-CoV-2 by FDA under an Emergency Use Authorization (EUA). This EUA will remain  in effect (meaning this test can be used) for the duration of the COVID-19 declaration under Section 56 4(b)(1) of the Act, 21 U.S.C. section 360bbb-3(b)(1), unless the authorization is terminated or revoked sooner. Performed at [facility], [place] [address] [place], [place] [address]          Radiology Studies: No results found.      Scheduled Meds: . acyclovir  400 mg Oral BID  . atovaquone  750 mg Oral Q breakfast  . dextrose  10 mL Intravenous Q24H  . dextrose  10 mL Intravenous Q24H  . flucytosine  25 mg/kg Oral Q6H  . loratadine  10 mg Oral Daily  . metoprolol tartrate  12.5 mg Oral BID  . sodium chloride  500 mL Intravenous Q24H  . sodium chloride  500 mL Intravenous Q24H   Continuous Infusions: . sodium chloride 75 mL/hr at 02/08/19 0902  . amphotericin  B  Liposome (AMBISOME) ADULT IV Stopped (02/07/19 1925)          Aline August, MD [facility] 02/08/2019, 11:25 AM

## 2019-02-08 NOTE — Progress Notes (Signed)
[place] for Infectious Disease  Date of Admission:  02/07/2019      Total days of antibiotics 2  Day 2 flucytosine and L-ampho            ASSESSMENT: David Whitehead is a 48 y.o. 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 Will check CD4 to determine any possibility for idiopathic low CD4 count, although CLL may influence results and make it hard to interpret.   Unfortunately he received a dose of lovenox last night at 23:40 which precludes him from safely having LP done today. Would urge that this be done as soon as safely possible to prevent permanent vision/hearing deficits related to his current condition. Fortunately his headaches are improved today but his diplopia remains. Will need daily lumbar punctures until his opening pressure is < 27 cmH2O x 2 days for therapeutic benefit with repeat glucose/protein please.   He was receiving acyclovir for shingles prophylaxis and atovaquone for PJP prophylaxis; would continue these for now pending hematology assessment regarding how long to hold his Imbruvica.     PLAN: 1. LP once safely able to be performed ASAP with repeat 24 hours later  2. Continue current antifungals  3. Electrolytes and kidneys stable - continue to monitor.  4. OK to place PICC line if OK with Dr. Starla Link  5. CD4 count in AM    Principal Problem:   Cryptococcal meningitis (HCC) Active Problems:   CLL (chronic lymphocytic leukemia) (HCC)   Pulmonary nodule   Hypertension   . acyclovir  400 mg Oral BID  . atovaquone  750 mg Oral Q breakfast  . dextrose  10 mL Intravenous Q24H  . dextrose  10 mL Intravenous Q24H  . flucytosine  25 mg/kg Oral Q6H  . loratadine  10 mg Oral Daily  . metoprolol tartrate  12.5 mg Oral BID  . potassium chloride  20 mEq Oral Once  . sodium chloride  500 mL Intravenous Q24H  . sodium chloride  500 mL Intravenous Q24H     SUBJECTIVE: Feeling a little better today - headache has eased off since yesterday. Persistent double vision without any worsening changes. No tinnitus today. He had one episode of "room spinning" while laying in bed that happened last night.   3 unsuccessful attempts to obtain LP in ER last night. Unable to go to IR today with prophylactic lovenox dose administered about 1130 pm   Review of Systems: Review of Systems  Constitutional: Negative for chills and fever.  HENT: Negative for hearing loss and tinnitus.   Eyes: Positive for blurred vision, double vision and photophobia. Negative for pain.  Respiratory: Negative for cough.   Cardiovascular: Negative for chest pain and leg swelling.  Musculoskeletal: Negative for neck pain.  Neurological: Positive for dizziness (once described above) and headaches (improved). Negative for weakness.    Allergies  Allergen Reactions  . Bactrim [Sulfamethoxazole-Trimethoprim] Rash  . Latex Other (See Comments)    Took skin off when removed tape  . Amoxicillin Rash    Has patient had a PCN reaction causing immediate rash, facial/tongue/throat swelling, SOB or lightheadedness with hypotension: No Has patient had a PCN reaction causing severe rash involving mucus membranes or skin necrosis: No Has patient had a PCN reaction that required hospitalization: No Has patient had a PCN reaction occurring within the last 10 years: No If all of the above  answers are "NO", then may proceed with Cephalosporin use.     OBJECTIVE: Vitals:   02/07/19 2116 02/07/19 2136 02/08/19 0349 02/08/19 0850  BP:  132/86 (!) 142/95 133/85  Pulse:  94 69 79  Resp:  16 18 18   Temp: 98.3 F (36.8 C) 98.3 F (36.8 C) 99.3 F (37.4 C) 98.4 F (36.9 C)  TempSrc: Oral Oral Oral Oral  SpO2:  96% 100% 98%  Weight:   94.5 kg   Height:       Body mass index is 29.89 kg/m.  Physical Exam Constitutional:      Comments: Resting comfortably in bed looking at phone.    HENT:     Mouth/Throat:     Mouth: Mucous membranes are moist.     Pharynx: Oropharynx is clear.  Neck:     Musculoskeletal: No neck rigidity or muscular tenderness.  Cardiovascular:     Rate and Rhythm: Normal rate and regular rhythm.  Pulmonary:     Effort: Pulmonary effort is normal.     Breath sounds: Normal breath sounds.  Abdominal:     General: There is no distension.     Tenderness: There is no abdominal tenderness.  Skin:    General: Skin is warm and dry.     Capillary Refill: Capillary refill takes less than 2 seconds.  Neurological:     Mental Status: He is alert and oriented to person, place, and time.     Lab Results Lab Results  Component Value Date   WBC 12.3 (H) 02/08/2019   HGB 14.5 02/08/2019   HCT 43.9 02/08/2019   MCV 82.2 02/08/2019   PLT 240 02/08/2019    Lab Results  Component Value Date   CREATININE 0.84 02/08/2019   BUN 8 02/08/2019   NA 138 02/08/2019   K 3.7 02/08/2019   CL 107 02/08/2019   CO2 21 (L) 02/08/2019    Lab Results  Component Value Date   ALT 23 02/08/2019   AST 14 (L) 02/08/2019   ALKPHOS 28 (L) 02/08/2019   BILITOT 0.9 02/08/2019     Microbiology: Recent Results (from the past 240 hour(s))  CSF culture     Status: None (Preliminary result)   Collection Time: 02/04/19  9:30 AM   Specimen: Lumbar Puncture; Cerebrospinal Fluid  Result Value Ref Range Status   MICRO NUMBER: [id number]  Preliminary   SPECIMEN QUALITY: Adequate  Preliminary   Source CEREBROSPINAL FLUID (CSF)  Preliminary   STATUS: PRELIMINARY  Preliminary   GRAM STAIN:   Preliminary    Many White blood cells seen No organisms seen Gram stain prepared by cytospin   Result: No growth to date  Preliminary   MYCOBACTERIA, CULTURE, WITH FLUOROCHROME SMEAR     Status: None (Preliminary result)   Collection Time: 02/04/19  9:30 AM  Result Value Ref Range Status   MICRO NUMBER: [id number]  Preliminary   SPECIMEN QUALITY: Adequate  Preliminary   Source:  CEREBROSPINAL FLUID (CSF)  Preliminary   STATUS: PRELIMINARY  Preliminary   SMEAR: No acid fast bacilli seen.  Preliminary   RESULT:   Preliminary    Culture results to follow. Final reports of negative cultures can be expected in approximately six weeks. Positive cultures are reported immediately.  Culture, blood (Routine x 2)     Status: None (Preliminary result)   Collection Time: 02/07/19  1:50 PM   Specimen: BLOOD LEFT ARM  Result Value Ref Range Status   Specimen Description BLOOD LEFT ARM  Final   Special Requests   Final    BOTTLES DRAWN AEROBIC AND ANAEROBIC Blood Culture results may not be optimal due to an excessive volume of blood received in culture bottles   Culture   Final    NO GROWTH < 24 HOURS Performed at [facility] [address], [place], [place] [address]    Report Status PENDING  Incomplete  Culture, blood (Routine x 2)     Status: None (Preliminary result)   Collection Time: 02/07/19  3:06 PM   Specimen: BLOOD RIGHT ARM  Result Value Ref Range Status   Specimen Description BLOOD RIGHT ARM  Final   Special Requests   Final    BOTTLES DRAWN AEROBIC AND ANAEROBIC Blood Culture results may not be optimal due to an excessive volume of blood received in culture bottles   Culture   Final    NO GROWTH < 24 HOURS Performed at [facility], [place] [address], [place], [place] [address]    Report Status PENDING  Incomplete  SARS CORONAVIRUS 2 (TAT 6-24 HRS) Nasopharyngeal Nasopharyngeal Swab     Status: None   Collection Time: 02/07/19  3:54 PM   Specimen: Nasopharyngeal Swab  Result Value Ref Range Status   SARS Coronavirus 2 NEGATIVE NEGATIVE Final    Comment: (NOTE) SARS-CoV-2 target nucleic acids are NOT DETECTED. The SARS-CoV-2 RNA is generally detectable in upper and lower respiratory specimens during the acute phase of infection. Negative results do not preclude SARS-CoV-2 infection, do not rule out co-infections with other pathogens, and should  not be used as the sole basis for treatment or other patient management decisions. Negative results must be combined with clinical observations, patient history, and epidemiological information. The expected result is Negative. Fact Sheet for Patients: SugarRoll.be Fact Sheet for Healthcare Providers: https://www.woods-mathews.com/ This test is not yet approved or cleared by the [place] FDA and  has been authorized for detection and/or diagnosis of SARS-CoV-2 by FDA under an Emergency Use Authorization (EUA). This EUA will remain  in effect (meaning this test can be used) for the duration of the COVID-19 declaration under Section 56 4(b)(1) of the Act, 21 U.S.C. section 360bbb-3(b)(1), unless the authorization is terminated or revoked sooner. Performed at [facility], [place] [address] [place], [place] [address]      Janene Madeira, MSN, NP-C [place] for Infectious Disease [email or web address] Pager: [phone] Office: [phone] [facility] Main Line: [phone]   02/08/2019  10:00 AM

## 2019-02-08 NOTE — Progress Notes (Signed)
Pharmacy Antibiotic Note  David Whitehead is a 48 y.o. 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 Pharmacy has been consulted for amphotericin B liposome dosing. Ambisome 3 mg/kg and flucytosine 25 mg/kg started in this patient with suspected cryptococcal meningitis. NS 548mL ordered to be given an hour before and an hour after the Ambisome infusion with D5 flushes administered inbetween. The pharmacy team will monitor the patients renal function, potassium and magnesium and make adjustments per protocol.  Today, potassium is at the lower end of normal at 3.7. Magnesium is normal at 2.1. Scr is stable at 0.84. The patient did not receive his post-Ambisome fluid bolus yesterday. I educated the nurse about the management of fluids while he is on these nephrotoxic medications.   Plan: Continue Ambisome 250 mg q24h (3 mg/kg) Continue Flucytosine 2,500 mg q6h (25 mg/kg) Give K 20 mEq PO x1 this morning--anticipating low K given borderline level this morning  Replace Mg and K as needed Monitor Scr   Height: 5\' 10"  (177.8 cm) Weight: 208 lb 4.8 oz (94.5 kg) IBW/kg (Calculated) : 73  Temp (24hrs), Avg:98.7 F (37.1 C), Min:98.3 F (36.8 C), Max:99.3 F (37.4 C)  Recent Labs  Lab 02/07/19 1520 02/08/19 0604  WBC 13.2* 12.3*  CREATININE 0.97 0.84  LATICACIDVEN 1.3  --     Estimated Creatinine Clearance: 128.2 mL/min (by C-G formula based on SCr of 0.84 mg/dL).    Allergies  Allergen Reactions  . Bactrim [Sulfamethoxazole-Trimethoprim] Rash  . Latex Other (See Comments)    Took skin off when removed tape  . Amoxicillin Rash    Has patient had a PCN reaction causing immediate rash, facial/tongue/throat swelling, SOB or lightheadedness with hypotension: No Has patient had a PCN reaction causing severe rash involving mucus membranes or skin necrosis: No Has patient had a PCN reaction that required hospitalization: No Has patient had a PCN reaction occurring within the last  10 years: No If all of the above answers are "NO", then may proceed with Cephalosporin use.     Antimicrobials this admission: Ambisome  9/28>> Flucytosine 9/28>>  Microbiology results: 9/28 BCx: NG <24 hr 9/28 CSFcx: sent 9/25 Crypto Ag detected; titer 1:64  Thank you for allowing pharmacy to be a part of this patient's care.  Agnes Lawrence, PharmD PGY1 Pharmacy Resident

## 2019-02-08 NOTE — Progress Notes (Signed)
Radiology called and stated that pt unable to receive Lumbar puncture today due to receiving Lovenox last night. Radiology stated that Lovenox will need to be held today in order to perform LP tomorrow.   MD Starla Link notified via secure chat.   Paulla Fore, RN,BSN

## 2019-02-09 ENCOUNTER — Inpatient Hospital Stay (HOSPITAL_COMMUNITY): Payer: BC Managed Care – PPO

## 2019-02-09 LAB — CRYPTOCOCCAL ANTIGEN, CSF
Crypto Ag: POSITIVE — AB
Cryptococcal Ag Titer: 20 — AB

## 2019-02-09 LAB — COMPREHENSIVE METABOLIC PANEL
ALT: 22 U/L (ref 0–44)
AST: 12 U/L — ABNORMAL LOW (ref 15–41)
Albumin: 3.3 g/dL — ABNORMAL LOW (ref 3.5–5.0)
Alkaline Phosphatase: 30 U/L — ABNORMAL LOW (ref 38–126)
Anion gap: 10 (ref 5–15)
BUN: 6 mg/dL (ref 6–20)
CO2: 21 mmol/L — ABNORMAL LOW (ref 22–32)
Calcium: 8.6 mg/dL — ABNORMAL LOW (ref 8.9–10.3)
Chloride: 106 mmol/L (ref 98–111)
Creatinine, Ser: 0.85 mg/dL (ref 0.61–1.24)
GFR calc Af Amer: >60 mL/min (ref >60)
GFR calc non Af Amer: >60 mL/min (ref >60)
Glucose, Bld: 115 mg/dL — ABNORMAL HIGH (ref 70–99)
Potassium: 3.1 mmol/L — ABNORMAL LOW (ref 3.5–5.1)
Sodium: 137 mmol/L (ref 135–145)
Total Bilirubin: 0.6 mg/dL (ref 0.3–1.2)
Total Protein: 5.5 g/dL — ABNORMAL LOW (ref 6.5–8.1)

## 2019-02-09 LAB — CBC WITH DIFFERENTIAL/PLATELET
Abs Immature Granulocytes: 0.04 10*3/uL (ref 0.00–0.07)
Basophils Absolute: 0.1 10*3/uL (ref 0.0–0.1)
Basophils Relative: 1 %
Eosinophils Absolute: 0.2 10*3/uL (ref 0.0–0.5)
Eosinophils Relative: 1 %
HCT: 40.7 % (ref 39.0–52.0)
Hemoglobin: 13.9 g/dL (ref 13.0–17.0)
Immature Granulocytes: 0 %
Lymphocytes Relative: 36 %
Lymphs Abs: 4.1 10*3/uL — ABNORMAL HIGH (ref 0.7–4.0)
MCH: 28.1 pg (ref 26.0–34.0)
MCHC: 34.2 g/dL (ref 30.0–36.0)
MCV: 82.2 fL (ref 80.0–100.0)
Monocytes Absolute: 0.6 10*3/uL (ref 0.1–1.0)
Monocytes Relative: 6 %
Neutro Abs: 6.5 10*3/uL (ref 1.7–7.7)
Neutrophils Relative %: 56 %
Platelets: 214 10*3/uL (ref 150–400)
RBC: 4.95 MIL/uL (ref 4.22–5.81)
RDW: 13.3 % (ref 11.5–15.5)
WBC: 11.4 10*3/uL — ABNORMAL HIGH (ref 4.0–10.5)
nRBC: 0 % (ref 0.0–0.2)

## 2019-02-09 LAB — CSF CELL COUNT WITH DIFFERENTIAL
Lymphs, CSF: 92 % — ABNORMAL HIGH (ref 40–80)
Monocyte-Macrophage-Spinal Fluid: 5 % — ABNORMAL LOW (ref 15–45)
RBC Count, CSF: 0 /mm3
Segmented Neutrophils-CSF: 3 % (ref 0–6)
Tube #: 3
WBC, CSF: 510 /mm3 (ref 0–5)

## 2019-02-09 LAB — MAGNESIUM: Magnesium: 2 mg/dL (ref 1.7–2.4)

## 2019-02-09 LAB — PROTEIN, CSF: Total  Protein, CSF: 73 mg/dL — ABNORMAL HIGH (ref 15–45)

## 2019-02-09 LAB — GLUCOSE, CSF: Glucose, CSF: 32 mg/dL — ABNORMAL LOW (ref 40–70)

## 2019-02-09 MED ORDER — POTASSIUM CHLORIDE CRYS ER 20 MEQ PO TBCR
40.0000 meq | EXTENDED_RELEASE_TABLET | Freq: Every day | ORAL | Status: DC
  Administered 2019-02-10: 40 meq via ORAL
  Filled 2019-02-09 (×2): qty 2

## 2019-02-09 MED ORDER — POTASSIUM CHLORIDE CRYS ER 20 MEQ PO TBCR
40.0000 meq | EXTENDED_RELEASE_TABLET | Freq: Once | ORAL | Status: AC
  Administered 2019-02-09: 40 meq via ORAL

## 2019-02-09 MED ORDER — LIDOCAINE HCL (PF) 1 % IJ SOLN
5.0000 mL | Freq: Once | INTRAMUSCULAR | Status: AC
  Administered 2019-02-09: 5 mL via INTRADERMAL

## 2019-02-09 MED ORDER — ONDANSETRON HCL 4 MG/2ML IJ SOLN
4.0000 mg | Freq: Four times a day (QID) | INTRAMUSCULAR | Status: AC | PRN
  Administered 2019-02-09: 11:00:00 4 mg via INTRAVENOUS

## 2019-02-09 MED ORDER — ONDANSETRON HCL 4 MG/2ML IJ SOLN
INTRAMUSCULAR | Status: AC
  Administered 2019-02-09: 4 mg via INTRAVENOUS
  Filled 2019-02-09: qty 2

## 2019-02-09 MED ORDER — POTASSIUM CHLORIDE 10 MEQ/100ML IV SOLN
10.0000 meq | INTRAVENOUS | Status: AC
  Administered 2019-02-09 (×4): 10 meq via INTRAVENOUS
  Filled 2019-02-09 (×4): qty 100

## 2019-02-09 NOTE — Plan of Care (Signed)
  Problem: Activity: Goal: Risk for activity intolerance will decrease Outcome: Progressing   Problem: Nutrition: Goal: Adequate nutrition will be maintained Outcome: Progressing   

## 2019-02-09 NOTE — Plan of Care (Signed)
  Problem: Health Behavior/Discharge Planning: Goal: Ability to manage health-related needs will improve Outcome: Progressing   Problem: Clinical Measurements: Goal: Will remain free from infection Outcome: Progressing   

## 2019-02-09 NOTE — Progress Notes (Addendum)
Pt states that he vomited around 0500 today. Pt states he is still feeling nauseous but pt does not have PRN medication for nausea. Notified MD O[place]ata via [place] page. MD called back and gave telephone orders for IV K 10 mEq x 4,  40 mg Potassium Chloride PO once, and EKG.     0946  Notified MD via [place] page that pt vomited again after eating breakfast. Also that pt is unable to take any PO medications at this time.  Paulla Fore, RN, BSN

## 2019-02-09 NOTE — Progress Notes (Signed)
Pharmacy Antibiotic Note  David Whitehead is a 48 y.o. 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 Pharmacy has been consulted for amphotericin B liposome dosing. Ambisome 3 mg/kg and flucytosine 25 mg/kg started in this patient with suspected cryptococcal meningitis. NS 53mL ordered to be given an hour before and an hour after the Ambisome infusion with D5 flushes administered inbetween. The pharmacy team will monitor the patients renal function, potassium and magnesium and make adjustments per protocol.  Today, potassium is at the lower end of normal at 3.1. We will replace with 80 mEq this morning and then start him on daily potassium 40 mEq by mouth. Magnesium is normal at 2.0. Scr is stable at 0.85. The patient did not receive his post-Ambisome fluid bolus on 9/28 but did receive it yesterday on 9/29.  Plan: Continue Ambisome 250 mg q24h (3 mg/kg) Continue Flucytosine 2,500 mg q6h (25 mg/kg) Give K 80 mEq PO this morning Start K 40 mEq daily on 10/1 Continue to replace Mg and K as needed Monitor Scr   Height: 5\' 10"  (177.8 cm) Weight: 208 lb 6.1 oz (94.5 kg) IBW/kg (Calculated) : 73  Temp (24hrs), Avg:99 F (37.2 C), Min:98.1 F (36.7 C), Max:99.7 F (37.6 C)  Recent Labs  Lab 02/07/19 1520 02/08/19 0604 02/09/19 0446  WBC 13.2* 12.3* 11.4*  CREATININE 0.97 0.84 0.85  LATICACIDVEN 1.3  --   --     Estimated Creatinine Clearance: 126.7 mL/min (by C-G formula based on SCr of 0.85 mg/dL).    Allergies  Allergen Reactions  . Bactrim [Sulfamethoxazole-Trimethoprim] Rash  . Latex Other (See Comments)    Took skin off when removed tape  . Amoxicillin Rash    Has patient had a PCN reaction causing immediate rash, facial/tongue/throat swelling, SOB or lightheadedness with hypotension: No Has patient had a PCN reaction causing severe rash involving mucus membranes or skin necrosis: No Has patient had a PCN reaction that required hospitalization: No Has patient  had a PCN reaction occurring within the last 10 years: No If all of the above answers are "NO", then may proceed with Cephalosporin use.     Antimicrobials this admission: Ambisome  9/28>> Flucytosine 9/28>>  Microbiology results: 9/28 BCx: NG <x2 days 9/28 CSFcx: sent 9/25 Crypto Ag detected; titer 1:64  Thank you for allowing pharmacy to be a part of this patient's care.  Nicoletta Dress, PharmD PGY2 Infectious Disease Pharmacy Resident

## 2019-02-09 NOTE — Progress Notes (Signed)
Patient ID: David Whitehead, male   DOB: 29-Sep-1970, 48 y.o.   MRN: [id number]  PROGRESS NOTE    David Whitehead  [id number] DOB: 08-02-1970 DOA: 02/07/2019 PCP: [name], MD   Brief Narrative:  48 year old male with history of CLL, hypertension, diabetes mellitus type 2 presented with subacute headache and diplopia.  He was recently evaluated by neurology and subsequently had LP done which showed opening pressure of 55.  His cryptococcal antigen titers came back positive at 1:64.  He was admitted for cryptococcal meningitis.  ID was consulted.  He was started on amphotericin B and flucytosine.  02/09/2019: Patient underwent repeat lumbar puncture today.  Opening pressure of 28 cm water was noted 14 cc of CSF was taken out.  Patient reported nausea and vomiting today.  Headache is improved.  Visual symptoms have improved.  Input from infectious disease is appreciated.  Awaiting input from heme-onc team.  Assessment & Plan: Cryptococcal meningitis: -Patient had a recent outpatient LP which had shown opening pressure of 55 and his cryptococcal antigen titers came back positive at 1:64 -Started on amphotericin B and flucytosine by ID.  Continue the same. -ID recommends daily LPs until normal opening pressures x2 -IR has been consulted: Apparently LP cannot be done today because patient received Lovenox last night.  Will hold Lovenox. -Continue monitoring electrolytes and renal function while using amphotericin B. -Follow further ID recommendations. 02/09/2019: Continue amphotericin B and flucytosine.  Infectious disease is directing care.  CLL -Spoke with Dr. Zhao/oncology on phone on 02/08/2019.  -He is okay for ibrutinib to be on hold for now. -Continue atovaquone and acyclovir. 02/09/2019: Awaiting heme-onc input.  Hypertension -Monitor blood pressure.  Continue metoprolol  Pulmonary nodule -Possibly source of his cryptococcus as per ID.  DVT prophylaxis: We will hold Lovenox.   SCDs Code Status: Full Family Communication: None at bedside Disposition Plan: We will probably need to be inpatient for at least 2 weeks for IV amphotericin.  Consultants: ID  Procedures: None  Antimicrobials: Amphotericin B and flucytosine from 02/07/2019 onwards Outpatient acyclovir and atovaquone being continued.  Subjective: Reported nausea and vomiting. Headache has improved. Visual symptoms have improved.  Objective: Vitals:   02/08/19 1710 02/08/19 2134 02/09/19 0446 02/09/19 1033  BP: (!) 148/91 (!) 148/86 132/90 (!) 144/88  Pulse: 80 82 81 78  Resp: 18 18 18  (!) 22  Temp: 99.7 F (37.6 C) 99.2 F (37.3 C) 98.8 F (37.1 C) 98.1 F (36.7 C)  TempSrc: Oral Oral Oral Oral  SpO2: 99% 99% 97% 98%  Weight:  94.5 kg    Height:        Intake/Output Summary (Last 24 hours) at 02/09/2019 1104 Last data filed at 02/09/2019 0600 Gross per 24 hour  Intake 3719.29 ml  Output 0 ml  Net 3719.29 ml   Filed Weights   02/07/19 1344 02/08/19 0349 02/08/19 2134  Weight: 95.7 kg 94.5 kg 94.5 kg    Examination:  General exam: Appears calm and comfortable  Respiratory system: Bilateral decreased breath sounds at bases Cardiovascular system: S1 & S2 heard, Rate controlled Gastrointestinal system: Abdomen is nondistended, soft and nontender. Normal bowel sounds heard. Extremities: No cyanosis, clubbing, edema    Data Reviewed: I have personally reviewed following labs and imaging studies  CBC: Recent Labs  Lab 02/07/19 1520 02/08/19 0604 02/09/19 0446  WBC 13.2* 12.3* 11.4*  NEUTROABS 4.5  --  6.5  HGB 14.9 14.5 13.9  HCT 45.9 43.9 40.7  MCV 83.6 82.2 82.2  PLT 255 240 [id number]   Basic Metabolic Panel: Recent Labs  Lab 02/07/19 1520 02/08/19 0604 02/09/19 0446  NA 137 138 137  K 3.7 3.7 3.1*  CL 101 107 106  CO2 26 21* 21*  GLUCOSE 103* 120* 115*  BUN 9 8 6   CREATININE 0.97 0.84 0.85  CALCIUM 8.8* 8.8* 8.6*  MG  --  2.1 2.0   GFR: Estimated Creatinine  Clearance: 126.7 mL/min (by C-G formula based on SCr of 0.85 mg/dL). Liver Function Tests: Recent Labs  Lab 02/04/19 0930 02/07/19 1520 02/08/19 0604 02/09/19 0446  AST  --  14* 14* 12*  ALT  --  27 23 22   ALKPHOS  --  30* 28* 30*  BILITOT  --  0.4 0.9 0.6  PROT  --  6.1* 5.6* 5.5*  ALBUMIN 4.8 3.9 3.5 3.3*   No results for input(s): LIPASE, AMYLASE in the last 168 hours. No results for input(s): AMMONIA in the last 168 hours. Coagulation Profile: Recent Labs  Lab 02/07/19 1520  INR 1.0   Cardiac Enzymes: No results for input(s): CKTOTAL, CKMB, CKMBINDEX, TROPONINI in the last 168 hours. BNP (last 3 results) No results for input(s): PROBNP in the last 8760 hours. HbA1C: No results for input(s): HGBA1C in the last 72 hours. CBG: No results for input(s): GLUCAP in the last 168 hours. Lipid Profile: No results for input(s): CHOL, HDL, LDLCALC, TRIG, CHOLHDL, LDLDIRECT in the last 72 hours. Thyroid Function Tests: No results for input(s): TSH, T4TOTAL, FREET4, T3FREE, THYROIDAB in the last 72 hours. Anemia Panel: No results for input(s): VITAMINB12, FOLATE, FERRITIN, TIBC, IRON, RETICCTPCT in the last 72 hours. Sepsis Labs: Recent Labs  Lab 02/07/19 1520  LATICACIDVEN 1.3    Recent Results (from the past 240 hour(s))  CSF culture     Status: None (Preliminary result)   Collection Time: 02/04/19  9:30 AM   Specimen: Lumbar Puncture; Cerebrospinal Fluid  Result Value Ref Range Status   MICRO NUMBER: [id number]  Preliminary   SPECIMEN QUALITY: Adequate  Preliminary   Source CEREBROSPINAL FLUID (CSF)  Preliminary   STATUS: PRELIMINARY  Preliminary   GRAM STAIN:   Preliminary    Many White blood cells seen No organisms seen Gram stain prepared by cytospin   Result: No growth to date  Preliminary   MYCOBACTERIA, CULTURE, WITH FLUOROCHROME SMEAR     Status: None (Preliminary result)   Collection Time: 02/04/19  9:30 AM  Result Value Ref Range Status   MICRO NUMBER:  [id number]  Preliminary   SPECIMEN QUALITY: Adequate  Preliminary   Source: CEREBROSPINAL FLUID (CSF)  Preliminary   STATUS: PRELIMINARY  Preliminary   SMEAR: No acid fast bacilli seen.  Preliminary   RESULT:   Preliminary    Culture results to follow. Final reports of negative cultures can be expected in approximately six weeks. Positive cultures are reported immediately.  Culture, blood (Routine x 2)     Status: None (Preliminary result)   Collection Time: 02/07/19  1:50 PM   Specimen: BLOOD LEFT ARM  Result Value Ref Range Status   Specimen Description BLOOD LEFT ARM  Final   Special Requests   Final    BOTTLES DRAWN AEROBIC AND ANAEROBIC Blood Culture results may not be optimal due to an excessive volume of blood received in culture bottles   Culture   Final    NO GROWTH 2 DAYS Performed at [facility], [place] [address], [place], [place] [address]    Report  Status PENDING  Incomplete  Culture, blood (Routine x 2)     Status: None (Preliminary result)   Collection Time: 02/07/19  3:06 PM   Specimen: BLOOD RIGHT ARM  Result Value Ref Range Status   Specimen Description BLOOD RIGHT ARM  Final   Special Requests   Final    BOTTLES DRAWN AEROBIC AND ANAEROBIC Blood Culture results may not be optimal due to an excessive volume of blood received in culture bottles   Culture   Final    NO GROWTH 2 DAYS Performed at [facility], [place] [address] [place], [place] [address]    Report Status PENDING  Incomplete  SARS CORONAVIRUS 2 (TAT 6-24 HRS) Nasopharyngeal Nasopharyngeal Swab     Status: None   Collection Time: 02/07/19  3:54 PM   Specimen: Nasopharyngeal Swab  Result Value Ref Range Status   SARS Coronavirus 2 NEGATIVE NEGATIVE Final    Comment: (NOTE) SARS-CoV-2 target nucleic acids are NOT DETECTED. The SARS-CoV-2 RNA is generally detectable in upper and lower respiratory specimens during the acute phase of infection. Negative results do not preclude SARS-CoV-2  infection, do not rule out co-infections with other pathogens, and should not be used as the sole basis for treatment or other patient management decisions. Negative results must be combined with clinical observations, patient history, and epidemiological information. The expected result is Negative. Fact Sheet for Patients: SugarRoll.be Fact Sheet for Healthcare Providers: https://www.woods-mathews.com/ This test is not yet approved or cleared by the [place] FDA and  has been authorized for detection and/or diagnosis of SARS-CoV-2 by FDA under an Emergency Use Authorization (EUA). This EUA will remain  in effect (meaning this test can be used) for the duration of the COVID-19 declaration under Section 56 4(b)(1) of the Act, 21 U.S.C. section 360bbb-3(b)(1), unless the authorization is terminated or revoked sooner. Performed at [facility], [place] [address] [place], [place] [address]          Radiology Studies: [place] Ekg Site Rite  Result Date: 02/08/2019 If [facility] image not attached, placement could not be confirmed due to current cardiac rhythm.       Scheduled Meds:  acyclovir  400 mg Oral BID   atovaquone  750 mg Oral Q breakfast   Chlorhexidine Gluconate Cloth  6 each Topical Daily   dextrose  10 mL Intravenous Q24H   dextrose  10 mL Intravenous Q24H   flucytosine  25 mg/kg Oral Q6H   lidocaine (PF)  5 mL Intradermal Once   loratadine  10 mg Oral Daily   metoprolol tartrate  12.5 mg Oral BID   potassium chloride  40 mEq Oral Once   sodium chloride  500 mL Intravenous Q24H   sodium chloride  500 mL Intravenous Q24H   Continuous Infusions:  sodium chloride 75 mL/hr at 02/08/19 0902   amphotericin  B  Liposome (AMBISOME) ADULT IV 250 mg (02/08/19 1714)   potassium chloride 10 mEq (02/09/19 0943)    Bonnell Public, MD [facility] 02/09/2019, 11:04 AM

## 2019-02-09 NOTE — Progress Notes (Signed)
[facility] Infectious Disease  Date of Admission:  02/07/2019      Total days of antibiotics 2  Day 2 flucytosine and L-ampho            ASSESSMENT: David Whitehead is a 48 y.o. 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 Oncology recommending to hold his chemo for now with treatment of serious fungal infection.   Continue flucytosine + L-ampho with pre-/post- hydration.   LP this AM with opening pressure 28 cmH20 - glucose pending. Would arrange for repeated study tomorrow AM 10/01 with glucose analysis and opening pressure documented     PLAN: 1. LP with IR 10/01 2. Would start potassium 40 mEq QD for baseline supplementation while on L-ampho 3. Continue daily and prn labs based on  4. Continue current antifungals  5. Kidney function stable     Principal Problem:   Cryptococcal meningitis ([place]) Active Problems:   CLL (chronic lymphocytic leukemia) (HCC)   Pulmonary nodule   Hypertension   . acyclovir  400 mg Oral BID  . atovaquone  750 mg Oral Q breakfast  . Chlorhexidine Gluconate Cloth  6 each Topical Daily  . dextrose  10 mL Intravenous Q24H  . dextrose  10 mL Intravenous Q24H  . flucytosine  25 mg/kg Oral Q6H  . loratadine  10 mg Oral Daily  . metoprolol tartrate  12.5 mg Oral BID  . potassium chloride  40 mEq Oral Once  . [START ON 02/10/2019] potassium chloride  40 mEq Oral Daily  . sodium chloride  500 mL Intravenous Q24H  . sodium chloride  500 mL Intravenous Q24H    SUBJECTIVE: Had his LP this morning and double vision is gone as are his headaches. He did vomit prior to going down which was new for him but he has had no trouble since. Feels like it was more related to the potassium pills he got. No new concerns.   Interval additions - K 3.1, getting 40   Review of Systems: Review of Systems  Constitutional: Negative for chills and fever.  HENT: Negative for hearing  loss and tinnitus.   Eyes: Positive for blurred vision, double vision and photophobia. Negative for pain.  Respiratory: Negative for cough.   Cardiovascular: Negative for chest pain and leg swelling.  Musculoskeletal: Negative for neck pain.  Neurological: Positive for dizziness (once described above) and headaches (improved). Negative for weakness.    Allergies  Allergen Reactions  . Bactrim [Sulfamethoxazole-Trimethoprim] Rash  . Latex Other (See Comments)    Took skin off when removed tape  . Amoxicillin Rash    Has patient had a PCN reaction causing immediate rash, facial/tongue/throat swelling, SOB or lightheadedness with hypotension: No Has patient had a PCN reaction causing severe rash involving mucus membranes or skin necrosis: No Has patient had a PCN reaction that required hospitalization: No Has patient had a PCN reaction occurring within the last 10 years: No If all of the above answers are "NO", then may proceed with Cephalosporin use.     OBJECTIVE: Vitals:   02/08/19 1710 02/08/19 2134 02/09/19 0446 02/09/19 1033  BP: (!) 148/91 (!) 148/86 132/90 (!) 144/88  Pulse: 80 82 81 78  Resp: 18 18 18  (!) 22  Temp: 99.7 F (37.6 C) 99.2 F (37.3 C) 98.8 F (37.1 C) 98.1 F (36.7 C)  TempSrc: Oral Oral Oral Oral  SpO2: 99% 99% 97%  98%  Weight:  94.5 kg    Height:       Body mass index is 29.9 kg/m.  Physical Exam Constitutional:      Comments: Resting comfortably in bed looking at phone.   HENT:     Mouth/Throat:     Mouth: Mucous membranes are moist.     Pharynx: Oropharynx is clear.  Neck:     Musculoskeletal: No neck rigidity or muscular tenderness.  Cardiovascular:     Rate and Rhythm: Normal rate and regular rhythm.  Pulmonary:     Effort: Pulmonary effort is normal.     Breath sounds: Normal breath sounds.  Abdominal:     General: There is no distension.     Tenderness: There is no abdominal tenderness.  Skin:    General: Skin is warm and dry.      Capillary Refill: Capillary refill takes less than 2 seconds.  Neurological:     Mental Status: He is alert and oriented to person, place, and time.     Lab Results Lab Results  Component Value Date   WBC 11.4 (H) 02/09/2019   HGB 13.9 02/09/2019   HCT 40.7 02/09/2019   MCV 82.2 02/09/2019   PLT 214 02/09/2019    Lab Results  Component Value Date   CREATININE 0.85 02/09/2019   BUN 6 02/09/2019   NA 137 02/09/2019   K 3.1 (L) 02/09/2019   CL 106 02/09/2019   CO2 21 (L) 02/09/2019    Lab Results  Component Value Date   ALT 22 02/09/2019   AST 12 (L) 02/09/2019   ALKPHOS 30 (L) 02/09/2019   BILITOT 0.6 02/09/2019     Microbiology: Recent Results (from the past 240 hour(s))  CSF culture     Status: None (Preliminary result)   Collection Time: 02/04/19  9:30 AM   Specimen: Lumbar Puncture; Cerebrospinal Fluid  Result Value Ref Range Status   MICRO NUMBER: [id number]  Preliminary   SPECIMEN QUALITY: Adequate  Preliminary   Source CEREBROSPINAL FLUID (CSF)  Preliminary   STATUS: PRELIMINARY  Preliminary   GRAM STAIN:   Preliminary    Many White blood cells seen No organisms seen Gram stain prepared by cytospin   Result: No growth to date  Preliminary   MYCOBACTERIA, CULTURE, WITH FLUOROCHROME SMEAR     Status: None (Preliminary result)   Collection Time: 02/04/19  9:30 AM  Result Value Ref Range Status   MICRO NUMBER: [id number]  Preliminary   SPECIMEN QUALITY: Adequate  Preliminary   Source: CEREBROSPINAL FLUID (CSF)  Preliminary   STATUS: PRELIMINARY  Preliminary   SMEAR: No acid fast bacilli seen.  Preliminary   RESULT:   Preliminary    Culture results to follow. Final reports of negative cultures can be expected in approximately six weeks. Positive cultures are reported immediately.  Culture, blood (Routine x 2)     Status: None (Preliminary result)   Collection Time: 02/07/19  1:50 PM   Specimen: BLOOD LEFT ARM  Result Value Ref Range Status   Specimen  Description BLOOD LEFT ARM  Final   Special Requests   Final    BOTTLES DRAWN AEROBIC AND ANAEROBIC Blood Culture results may not be optimal due to an excessive volume of blood received in culture bottles   Culture   Final    NO GROWTH 2 DAYS Performed at [facility], [place] [address], [place], [place] [address]    Report Status PENDING  Incomplete  Culture, blood (  Routine x 2)     Status: None (Preliminary result)   Collection Time: 02/07/19  3:06 PM   Specimen: BLOOD RIGHT ARM  Result Value Ref Range Status   Specimen Description BLOOD RIGHT ARM  Final   Special Requests   Final    BOTTLES DRAWN AEROBIC AND ANAEROBIC Blood Culture results may not be optimal due to an excessive volume of blood received in culture bottles   Culture   Final    NO GROWTH 2 DAYS Performed at [facility], [place] [address] [place],  [place] [address]    Report Status PENDING  Incomplete  SARS CORONAVIRUS 2 (TAT 6-24 HRS) Nasopharyngeal Nasopharyngeal Swab     Status: None   Collection Time: 02/07/19  3:54 PM   Specimen: Nasopharyngeal Swab  Result Value Ref Range Status   SARS Coronavirus 2 NEGATIVE NEGATIVE Final    Comment: (NOTE) SARS-CoV-2 target nucleic acids are NOT DETECTED. The SARS-CoV-2 RNA is generally detectable in upper and lower respiratory specimens during the acute phase of infection. Negative results do not preclude SARS-CoV-2 infection, do not rule out co-infections with other pathogens, and should not be used as the sole basis for treatment or other patient management decisions. Negative results must be combined with clinical observations, patient history, and epidemiological information. The expected result is Negative. Fact Sheet for Patients: SugarRoll.be Fact Sheet for Healthcare Providers: https://www.woods-mathews.com/ This test is not yet approved or cleared by the [place] FDA and  has been authorized for detection  and/or diagnosis of SARS-CoV-2 by FDA under an Emergency Use Authorization (EUA). This EUA will remain  in effect (meaning this test can be used) for the duration of the COVID-19 declaration under Section 56 4(b)(1) of the Act, 21 U.S.C. section 360bbb-3(b)(1), unless the authorization is terminated or revoked sooner. Performed at [facility], [place] [address], [place], [place] [address]      Janene Madeira, MSN, NP-C [facility] [email or web address] Pager: [phone] Office: [phone] [place]: [phone]   02/09/2019  1:17 PM

## 2019-02-09 NOTE — Progress Notes (Signed)
CRITICAL VALUE STICKER  CRITICAL VALUE:Spinal WBC=510  RECEIVER (on-site recipient of call): Emilio Math  DATE & TIME NOTIFIED: 02/09/19 1316  MESSENGER (representative from lab):  MD NOTIFIED: Ogbata MD   TIME OF NOTIFICATION: [id number]  RESPONSE: Notified MD via amion text.  Paulla Fore, RN, BSN.

## 2019-02-10 DIAGNOSIS — G039 Meningitis, unspecified: Secondary | ICD-10-CM

## 2019-02-10 DIAGNOSIS — Z95828 Presence of other vascular implants and grafts: Secondary | ICD-10-CM

## 2019-02-10 LAB — CD4/CD8 (T-HELPER/T-SUPPRESSOR CELL)
CD4 absolute: 555 /uL (ref 400–1790)
CD4%: 15 % — ABNORMAL LOW (ref 33–65)
CD8 T Cell Abs: 111 /uL — ABNORMAL LOW (ref 190–1000)
CD8tox: 3 % — ABNORMAL LOW (ref 12–40)
Ratio: 5 — ABNORMAL HIGH (ref 1.0–3.0)
Total lymphocyte count: 3601 /uL (ref 1000–4000)

## 2019-02-10 LAB — MAGNESIUM: Magnesium: 2.2 mg/dL (ref 1.7–2.4)

## 2019-02-10 LAB — BASIC METABOLIC PANEL
Anion gap: 7 (ref 5–15)
BUN: 6 mg/dL (ref 6–20)
CO2: 22 mmol/L (ref 22–32)
Calcium: 8.8 mg/dL — ABNORMAL LOW (ref 8.9–10.3)
Chloride: 111 mmol/L (ref 98–111)
Creatinine, Ser: 0.8 mg/dL (ref 0.61–1.24)
GFR calc Af Amer: >60 mL/min (ref >60)
GFR calc non Af Amer: >60 mL/min (ref >60)
Glucose, Bld: 111 mg/dL — ABNORMAL HIGH (ref 70–99)
Potassium: 3.3 mmol/L — ABNORMAL LOW (ref 3.5–5.1)
Sodium: 140 mmol/L (ref 135–145)

## 2019-02-10 MED ORDER — POTASSIUM CHLORIDE CRYS ER 20 MEQ PO TBCR
40.0000 meq | EXTENDED_RELEASE_TABLET | ORAL | Status: AC
  Administered 2019-02-10 (×2): 40 meq via ORAL
  Filled 2019-02-10 (×2): qty 2

## 2019-02-10 MED ORDER — POTASSIUM CHLORIDE CRYS ER 20 MEQ PO TBCR
40.0000 meq | EXTENDED_RELEASE_TABLET | Freq: Two times a day (BID) | ORAL | Status: DC
  Administered 2019-02-10 – 2019-02-11 (×2): 40 meq via ORAL
  Filled 2019-02-10 (×2): qty 2

## 2019-02-10 NOTE — Progress Notes (Signed)
Patient ID: David Whitehead, male   DOB: 10/27/1970, 48 y.o.   MRN: [id number]  PROGRESS NOTE    David Whitehead  [id number] DOB: 04-Dec-1970 DOA: 02/07/2019 PCP: [name], MD   Brief Narrative:  48 year old male with history of CLL, hypertension, diabetes mellitus type 2 presented with subacute headache and diplopia.  He was recently evaluated by neurology and subsequently had LP done which showed opening pressure of 55.  His cryptococcal antigen titers came back positive at 1:64.  He was admitted for cryptococcal meningitis.  ID was consulted.  He was started on amphotericin B and flucytosine.  02/09/2019: Patient underwent repeat lumbar puncture today.  Opening pressure of 28 cm water was noted 14 cc of CSF was taken out.  Patient reported nausea and vomiting today.  Headache is improved.  Visual symptoms have improved.  Input from infectious disease is appreciated.  Awaiting input from heme-onc team.  02/10/2019: Patient seen.  No new complaints.  Nausea and vomiting have resolved.  Potassium of 3.4 is noted, will replete.  For repeat LP today as per infectious disease team based on opening pressure yesterday (28 cmH2O).  Assessment & Plan: Cryptococcal meningitis: -Patient had a recent outpatient LP which had shown opening pressure of 55 and his cryptococcal antigen titers came back positive at 1:64 -Started on amphotericin B and flucytosine by ID.  Continue the same. -ID recommends daily LPs until normal opening pressures x2 -IR has been consulted: Apparently LP cannot be done today because patient received Lovenox last night.  Will hold Lovenox. -Continue monitoring electrolytes and renal function while using amphotericin B. -Follow further ID recommendations. 02/09/2019: Continue amphotericin B and flucytosine.  Infectious disease is directing care. 02/10/2019: For lumbar puncture today.  Continue to monitor opening pressures.  CLL -Spoke with Dr. Zhao/oncology on phone on  02/08/2019.  -He is okay for ibrutinib to be on hold for now. -Continue atovaquone and acyclovir. 02/09/2019: Awaiting heme-onc input.  Hypertension -Monitor blood pressure.  Continue metoprolol  Pulmonary nodule -Possibly source of his cryptococcus as per ID.  DVT prophylaxis: We will hold Lovenox.  SCDs Code Status: Full Family Communication: None at bedside Disposition Plan: We will probably need to be inpatient for at least 2 weeks for IV amphotericin.  Consultants: ID  Procedures: None  Antimicrobials: Amphotericin B and flucytosine from 02/07/2019 onwards Outpatient acyclovir and atovaquone being continued.  Subjective: No nausea or vomiting. Headache has improved significantly.  Objective: Vitals:   02/09/19 1614 02/09/19 2046 02/10/19 0542 02/10/19 0850  BP: 122/84 136/81 (!) 146/95 (!) 174/93  Pulse: 73 76 77 83  Resp: 18 18 17 18   Temp: 98.8 F (37.1 C) 98.3 F (36.8 C) 98.2 F (36.8 C) 97.7 F (36.5 C)  TempSrc: Oral Oral Oral Oral  SpO2: 100% 99% 98% 100%  Weight:  94.5 kg    Height:        Intake/Output Summary (Last 24 hours) at 02/10/2019 1024 Last data filed at 02/10/2019 0900 Gross per 24 hour  Intake 3900 ml  Output 0 ml  Net 3900 ml   Filed Weights   02/08/19 0349 02/08/19 2134 02/09/19 2046  Weight: 94.5 kg 94.5 kg 94.5 kg    Examination:  General exam: Appears calm and comfortable  Respiratory system: Bilateral decreased breath sounds at bases Cardiovascular system: S1 & S2 heard, Rate controlled Gastrointestinal system: Abdomen is nondistended, soft and nontender. Normal bowel sounds heard. Extremities: No cyanosis, clubbing, edema    Data Reviewed: I have personally  reviewed following labs and imaging studies  CBC: Recent Labs  Lab 02/07/19 1520 02/08/19 0604 02/09/19 0446  WBC 13.2* 12.3* 11.4*  NEUTROABS 4.5  --  6.5  HGB 14.9 14.5 13.9  HCT 45.9 43.9 40.7  MCV 83.6 82.2 82.2  PLT 255 240 [id number]   Basic Metabolic Panel:  Recent Labs  Lab 02/07/19 1520 02/08/19 0604 02/09/19 0446 02/10/19 0414  NA 137 138 137 140  K 3.7 3.7 3.1* 3.3*  CL 101 107 106 111  CO2 26 21* 21* 22  GLUCOSE 103* 120* 115* 111*  BUN 9 8 6 6   CREATININE 0.97 0.84 0.85 0.80  CALCIUM 8.8* 8.8* 8.6* 8.8*  MG  --  2.1 2.0 2.2   GFR: Estimated Creatinine Clearance: 134.6 mL/min (by C-G formula based on SCr of 0.8 mg/dL). Liver Function Tests: Recent Labs  Lab 02/04/19 0930 02/07/19 1520 02/08/19 0604 02/09/19 0446  AST  --  14* 14* 12*  ALT  --  27 23 22   ALKPHOS  --  30* 28* 30*  BILITOT  --  0.4 0.9 0.6  PROT  --  6.1* 5.6* 5.5*  ALBUMIN 4.8 3.9 3.5 3.3*   No results for input(s): LIPASE, AMYLASE in the last 168 hours. No results for input(s): AMMONIA in the last 168 hours. Coagulation Profile: Recent Labs  Lab 02/07/19 1520  INR 1.0   Cardiac Enzymes: No results for input(s): CKTOTAL, CKMB, CKMBINDEX, TROPONINI in the last 168 hours. BNP (last 3 results) No results for input(s): PROBNP in the last 8760 hours. HbA1C: No results for input(s): HGBA1C in the last 72 hours. CBG: No results for input(s): GLUCAP in the last 168 hours. Lipid Profile: No results for input(s): CHOL, HDL, LDLCALC, TRIG, CHOLHDL, LDLDIRECT in the last 72 hours. Thyroid Function Tests: No results for input(s): TSH, T4TOTAL, FREET4, T3FREE, THYROIDAB in the last 72 hours. Anemia Panel: No results for input(s): VITAMINB12, FOLATE, FERRITIN, TIBC, IRON, RETICCTPCT in the last 72 hours. Sepsis Labs: Recent Labs  Lab 02/07/19 1520  LATICACIDVEN 1.3    Recent Results (from the past 240 hour(s))  CSF culture     Status: None (Preliminary result)   Collection Time: 02/04/19  9:30 AM   Specimen: Lumbar Puncture; Cerebrospinal Fluid  Result Value Ref Range Status   MICRO NUMBER: [id number]  Preliminary   SPECIMEN QUALITY: Adequate  Preliminary   Source CEREBROSPINAL FLUID (CSF)  Preliminary   STATUS: PRELIMINARY  Preliminary   GRAM  STAIN:   Preliminary    Many White blood cells seen No organisms seen Gram stain prepared by cytospin   Result: No growth to date  Preliminary   MYCOBACTERIA, CULTURE, WITH FLUOROCHROME SMEAR     Status: None (Preliminary result)   Collection Time: 02/04/19  9:30 AM  Result Value Ref Range Status   MICRO NUMBER: [id number]  Preliminary   SPECIMEN QUALITY: Adequate  Preliminary   Source: CEREBROSPINAL FLUID (CSF)  Preliminary   STATUS: PRELIMINARY  Preliminary   SMEAR: No acid fast bacilli seen.  Preliminary   RESULT:   Preliminary    Culture results to follow. Final reports of negative cultures can be expected in approximately six weeks. Positive cultures are reported immediately.  Culture, blood (Routine x 2)     Status: None (Preliminary result)   Collection Time: 02/07/19  1:50 PM   Specimen: BLOOD LEFT ARM  Result Value Ref Range Status   Specimen Description BLOOD LEFT ARM  Final   Special Requests  Final    BOTTLES DRAWN AEROBIC AND ANAEROBIC Blood Culture results may not be optimal due to an excessive volume of blood received in culture bottles   Culture   Final    NO GROWTH 3 DAYS Performed at [facility], [place] [address] [place], [place] [address]    Report Status PENDING  Incomplete  Culture, blood (Routine x 2)     Status: None (Preliminary result)   Collection Time: 02/07/19  3:06 PM   Specimen: BLOOD RIGHT ARM  Result Value Ref Range Status   Specimen Description BLOOD RIGHT ARM  Final   Special Requests   Final    BOTTLES DRAWN AEROBIC AND ANAEROBIC Blood Culture results may not be optimal due to an excessive volume of blood received in culture bottles   Culture   Final    NO GROWTH 3 DAYS Performed at [facility] [facility], [place] [address], [place], [place] [address]    Report Status PENDING  Incomplete  SARS CORONAVIRUS 2 (TAT 6-24 HRS) Nasopharyngeal Nasopharyngeal Swab     Status: None   Collection Time: 02/07/19  3:54 PM   Specimen: Nasopharyngeal Swab   Result Value Ref Range Status   SARS Coronavirus 2 NEGATIVE NEGATIVE Final    Comment: (NOTE) SARS-CoV-2 target nucleic acids are NOT DETECTED. The SARS-CoV-2 RNA is generally detectable in upper and lower respiratory specimens during the acute phase of infection. Negative results do not preclude SARS-CoV-2 infection, do not rule out co-infections with other pathogens, and should not be used as the sole basis for treatment or other patient management decisions. Negative results must be combined with clinical observations, patient history, and epidemiological information. The expected result is Negative. Fact Sheet for Patients: SugarRoll.be Fact Sheet for Healthcare Providers: https://www.woods-mathews.com/ This test is not yet approved or cleared by the [place] FDA and  has been authorized for detection and/or diagnosis of SARS-CoV-2 by FDA under an Emergency Use Authorization (EUA). This EUA will remain  in effect (meaning this test can be used) for the duration of the COVID-19 declaration under Section 56 4(b)(1) of the Act, 21 U.S.C. section 360bbb-3(b)(1), unless the authorization is terminated or revoked sooner. Performed at [facility], [place] [address] [place], [place] [address]   CSF culture     Status: None (Preliminary result)   Collection Time: 02/09/19 11:36 AM   Specimen: PATH Cytology CSF; Cerebrospinal Fluid  Result Value Ref Range Status   Specimen Description CSF  Final   Special Requests NONE  Final   Gram Stain   Final    WBC PRESENT,BOTH PMN AND MONONUCLEAR NO ORGANISMS SEEN CYTOSPIN SMEAR    Culture   Final    NO GROWTH < 24 HOURS Performed at [facility], [place] [address], [place], [place] [address]    Report Status PENDING  Incomplete         Radiology Studies: [place] Ekg Site Rite  Result Date: 02/08/2019 If Site Rite image not attached, placement could not be confirmed due to current  cardiac rhythm.  Dg Fl Guided Lumbar Puncture  Result Date: 02/09/2019 CLINICAL DATA:  48 year old male with history of meningitis. EXAM: DIAGNOSTIC LUMBAR PUNCTURE UNDER FLUOROSCOPIC GUIDANCE FLUOROSCOPY TIME:  Fluoroscopy Time:  24 seconds Radiation Exposure Index (if provided by the fluoroscopic device): 2.7 mGy PROCEDURE: Informed consent was obtained from the patient prior to the procedure, including potential complications of headache, allergy, and pain. With the patient prone, the lower back was  prepped with Betadine. 1% Lidocaine was used for local anesthesia. Lumbar puncture was performed at the L3-L4 level using a 20 gauge needle with return of clear CSF with an opening pressure of 28 cm water. 14 ml of CSF were obtained for laboratory studies. The patient tolerated the procedure well and there were no apparent complications. IMPRESSION: 1. Successful uncomplicated fluoroscopic guided lumbar puncture, as above. Electronically Signed   By: Vinnie Langton M.D.   On: 02/09/2019 11:52        Scheduled Meds: . acyclovir  400 mg Oral BID  . atovaquone  750 mg Oral Q breakfast  . Chlorhexidine Gluconate Cloth  6 each Topical Daily  . dextrose  10 mL Intravenous Q24H  . dextrose  10 mL Intravenous Q24H  . flucytosine  25 mg/kg Oral Q6H  . loratadine  10 mg Oral Daily  . metoprolol tartrate  12.5 mg Oral BID  . potassium chloride  40 mEq Oral BID  . potassium chloride  40 mEq Oral Q4H  . sodium chloride  500 mL Intravenous Q24H  . sodium chloride  500 mL Intravenous Q24H   Continuous Infusions: . sodium chloride 75 mL/hr at 02/09/19 1518  . amphotericin  B  Liposome (AMBISOME) ADULT IV 250 mg (02/09/19 1814)    Bonnell Public, MD [facility] 02/10/2019, 10:24 AM

## 2019-02-10 NOTE — Progress Notes (Addendum)
[facility] for Infectious Disease  Date of Admission:  02/07/2019      Total days of antibiotics 3  Day 3 flucytosine and L-ampho            ASSESSMENT: David Whitehead is a 48 y.o. 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 Oncology recommending to hold his chemo for now with treatment of serious fungal infection. Will D/C Atovaquone since Imbruvica is stopped and last dose was almost a week ago now.   Continue flucytosine + L-ampho with pre-/post- hydration.   LP yesterday with still elevated opening pressure 28 cmH20. His vision remains stable but has a bilateral anterior frontal-temporal headache today - D/W Dr. Marthenia Rolling and would arrange LP tomorrow 10/2 and 10/3 for therapeutic benefit to relieve pressure and ongoing monitoring of infection - will order cell count, Glucose/Protein, Gram stain/Culture. Repeated cryptococcal Ag on CSF titer decreased to 1:20 from yesterday's study.    PLAN: 1. LP with IR 10/02 & 10/3 2. Appreciate pharmacy's assistance with electrolyte replacement  3. Continue daily and prn labs based on  4. Continue current antifungals     Principal Problem:   Cryptococcal meningitis ([place]) Active Problems:   CLL (chronic lymphocytic leukemia) ([facility])   Pulmonary nodule   Hypertension   . acyclovir  400 mg Oral BID  . atovaquone  750 mg Oral Q breakfast  . Chlorhexidine Gluconate Cloth  6 each Topical Daily  . dextrose  10 mL Intravenous Q24H  . dextrose  10 mL Intravenous Q24H  . flucytosine  25 mg/kg Oral Q6H  . loratadine  10 mg Oral Daily  . metoprolol tartrate  12.5 mg Oral BID  . potassium chloride  40 mEq Oral BID  . sodium chloride  500 mL Intravenous Q24H  . sodium chloride  500 mL Intravenous Q24H    SUBJECTIVE: Double vision is still resolved, although today he has a worse headache that is described to be frontal-temporal region bilaterally. No nausea/vomiting or  tinnitus.   Interval additions - Mg 2.2, K 3.3, Cr. 0.80  Review of Systems: Review of Systems  Constitutional: Negative for chills and fever.  HENT: Negative for hearing loss and tinnitus.   Eyes: Negative for blurred vision, double vision, photophobia and pain.  Cardiovascular: Negative for chest pain and leg swelling.  Musculoskeletal: Negative for neck pain.  Neurological: Positive for headaches (frontal and b/l temporal region). Negative for dizziness (once described above) and weakness.    Allergies  Allergen Reactions  . Bactrim [Sulfamethoxazole-Trimethoprim] Rash  . Latex Other (See Comments)    Took skin off when removed tape  . Amoxicillin Rash    Has patient had a PCN reaction causing immediate rash, facial/tongue/throat swelling, SOB or lightheadedness with hypotension: No Has patient had a PCN reaction causing severe rash involving mucus membranes or skin necrosis: No Has patient had a PCN reaction that required hospitalization: No Has patient had a PCN reaction occurring within the last 10 years: No If all of the above answers are "NO", then may proceed with Cephalosporin use.     OBJECTIVE: Vitals:   02/09/19 1614 02/09/19 2046 02/10/19 0542 02/10/19 0850  BP: 122/84 136/81 (!) 146/95 (!) 174/93  Pulse: 73 76 77 83  Resp: 18 18 17 18   Temp: 98.8 F (37.1 C) 98.3 F (36.8 C) 98.2 F (36.8 C) 97.7 F (36.5 C)  TempSrc: Oral Oral Oral Oral  SpO2: 100% 99% 98% 100%  Weight:  94.5 kg    Height:       Body mass index is 29.9 kg/m.  Physical Exam Constitutional:      Comments: Resting comfortably in bed watching TV  HENT:     Mouth/Throat:     Mouth: Mucous membranes are moist.     Pharynx: Oropharynx is clear.  Neck:     Musculoskeletal: No neck rigidity or muscular tenderness.  Cardiovascular:     Rate and Rhythm: Normal rate and regular rhythm.  Pulmonary:     Effort: Pulmonary effort is normal.     Breath sounds: Normal breath sounds.   Abdominal:     General: There is no distension.     Tenderness: There is no abdominal tenderness.  Musculoskeletal:     Comments: LUE PICC In place with clean/dry dressing  Skin:    General: Skin is warm and dry.     Capillary Refill: Capillary refill takes less than 2 seconds.  Neurological:     Mental Status: He is alert and oriented to person, place, and time.     Lab Results Lab Results  Component Value Date   WBC 11.4 (H) 02/09/2019   HGB 13.9 02/09/2019   HCT 40.7 02/09/2019   MCV 82.2 02/09/2019   PLT 214 02/09/2019    Lab Results  Component Value Date   CREATININE 0.80 02/10/2019   BUN 6 02/10/2019   NA 140 02/10/2019   K 3.3 (L) 02/10/2019   CL 111 02/10/2019   CO2 22 02/10/2019    Lab Results  Component Value Date   ALT 22 02/09/2019   AST 12 (L) 02/09/2019   ALKPHOS 30 (L) 02/09/2019   BILITOT 0.6 02/09/2019     Microbiology: Recent Results (from the past 240 hour(s))  CSF culture     Status: Abnormal   Collection Time: 02/04/19  9:30 AM   Specimen: Lumbar Puncture; Cerebrospinal Fluid  Result Value Ref Range Status   MICRO NUMBER: [id number]  Final   SPECIMEN QUALITY: Adequate  Final   Source CEREBROSPINAL FLUID (CSF)  Final   STATUS: FINAL  Final   GRAM STAIN: (A)  Final    Many White blood cells seen No organisms seen Gram stain prepared by cytospin   ISOLATE 1: Cryptococcus neoformans (AA)  Final    Comment: Scant growth of Cryptococcus neoformans   MYCOBACTERIA, CULTURE, WITH FLUOROCHROME SMEAR     Status: None (Preliminary result)   Collection Time: 02/04/19  9:30 AM  Result Value Ref Range Status   MICRO NUMBER: [id number]  Preliminary   SPECIMEN QUALITY: Adequate  Preliminary   Source: CEREBROSPINAL FLUID (CSF)  Preliminary   STATUS: PRELIMINARY  Preliminary   SMEAR: No acid fast bacilli seen.  Preliminary   RESULT:   Preliminary    Culture results to follow. Final reports of negative cultures can be expected in approximately six weeks.  Positive cultures are reported immediately.  Culture, blood (Routine x 2)     Status: None (Preliminary result)   Collection Time: 02/07/19  1:50 PM   Specimen: BLOOD LEFT ARM  Result Value Ref Range Status   Specimen Description BLOOD LEFT ARM  Final   Special Requests   Final    BOTTLES DRAWN AEROBIC AND ANAEROBIC Blood Culture results may not be optimal due to an excessive volume of blood received in culture bottles   Culture   Final    NO GROWTH 3 DAYS Performed at  [facility], [place] [address] [place], [place] [address]    Report Status PENDING  Incomplete  Culture, blood (Routine x 2)     Status: None (Preliminary result)   Collection Time: 02/07/19  3:06 PM   Specimen: BLOOD RIGHT ARM  Result Value Ref Range Status   Specimen Description BLOOD RIGHT ARM  Final   Special Requests   Final    BOTTLES DRAWN AEROBIC AND ANAEROBIC Blood Culture results may not be optimal due to an excessive volume of blood received in culture bottles   Culture   Final    NO GROWTH 3 DAYS Performed at [facility], [place] [address] [place], [place] [address]    Report Status PENDING  Incomplete  SARS CORONAVIRUS 2 (TAT 6-24 HRS) Nasopharyngeal Nasopharyngeal Swab     Status: None   Collection Time: 02/07/19  3:54 PM   Specimen: Nasopharyngeal Swab  Result Value Ref Range Status   SARS Coronavirus 2 NEGATIVE NEGATIVE Final    Comment: (NOTE) SARS-CoV-2 target nucleic acids are NOT DETECTED. The SARS-CoV-2 RNA is generally detectable in upper and lower respiratory specimens during the acute phase of infection. Negative results do not preclude SARS-CoV-2 infection, do not rule out co-infections with other pathogens, and should not be used as the sole basis for treatment or other patient management decisions. Negative results must be combined with clinical observations, patient history, and epidemiological information. The expected result is Negative. Fact Sheet for Patients:  SugarRoll.be Fact Sheet for Healthcare Providers: https://www.woods-mathews.com/ This test is not yet approved or cleared by the [place] FDA and  has been authorized for detection and/or diagnosis of SARS-CoV-2 by FDA under an Emergency Use Authorization (EUA). This EUA will remain  in effect (meaning this test can be used) for the duration of the COVID-19 declaration under Section 56 4(b)(1) of the Act, 21 U.S.C. section 360bbb-3(b)(1), unless the authorization is terminated or revoked sooner. Performed at [facility], [place] [address], [place], [place] [address]   CSF culture     Status: None (Preliminary result)   Collection Time: 02/09/19 11:36 AM   Specimen: PATH Cytology CSF; Cerebrospinal Fluid  Result Value Ref Range Status   Specimen Description CSF  Final   Special Requests NONE  Final   Gram Stain   Final    WBC PRESENT,BOTH PMN AND MONONUCLEAR NO ORGANISMS SEEN CYTOSPIN SMEAR    Culture   Final    NO GROWTH < 24 HOURS Performed at [facility], [place] [address] [place], [place] [address]    Report Status PENDING  Incomplete    Janene Madeira, MSN, NP-C [facility]ious [facility] [email or web address] Pager: [phone] Office: [place]: [phone]   02/10/2019  3:33 PM

## 2019-02-10 NOTE — Plan of Care (Signed)
  Problem: Clinical Measurements: Goal: Respiratory complications will improve Outcome: Progressing   

## 2019-02-10 NOTE — Progress Notes (Signed)
David Rolling, MD gave verbal order to D/C telemetry. Orders followed.

## 2019-02-10 NOTE — Progress Notes (Signed)
Pharmacy Antibiotic Note  Kristain Zunich is a 48 y.o. 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 Pharmacy has been consulted for amphotericin B liposome dosing. Ambisome 3 mg/kg and flucytosine 25 mg/kg started in this patient with suspected cryptococcal meningitis. NS 553mL ordered to be given an hour before and an hour after the Ambisome infusion with D5 flushes administered inbetween. The pharmacy team will monitor the patients renal function, potassium and magnesium and make adjustments per protocol.  Today, potassium still is at the lower end of normal at 3.3 despite 80 mEqs of K yesterday. I have added 40 mEqs of potassium BID to be scheduled for him. Magnesium is normal at 2.0. Scr is stable at 0.80.   Plan: Continue Ambisome 250 mg q24h (3 mg/kg) Continue Flucytosine 2,500 mg q6h (25 mg/kg) Schedule potassium 40 mEq BID Continue to replace Mg and K as needed Monitor Scr   Height: 5\' 10"  (177.8 cm) Weight: 208 lb 6.1 oz (94.5 kg) IBW/kg (Calculated) : 73  Temp (24hrs), Avg:98.2 F (36.8 C), Min:97.7 F (36.5 C), Max:98.8 F (37.1 C)  Recent Labs  Lab 02/07/19 1520 02/08/19 0604 02/09/19 0446 02/10/19 0414  WBC 13.2* 12.3* 11.4*  --   CREATININE 0.97 0.84 0.85 0.80  LATICACIDVEN 1.3  --   --   --     Estimated Creatinine Clearance: 134.6 mL/min (by C-G formula based on SCr of 0.8 mg/dL).    Allergies  Allergen Reactions  . Bactrim [Sulfamethoxazole-Trimethoprim] Rash  . Latex Other (See Comments)    Took skin off when removed tape  . Amoxicillin Rash    Has patient had a PCN reaction causing immediate rash, facial/tongue/throat swelling, SOB or lightheadedness with hypotension: No Has patient had a PCN reaction causing severe rash involving mucus membranes or skin necrosis: No Has patient had a PCN reaction that required hospitalization: No Has patient had a PCN reaction occurring within the last 10 years: No If all of the above answers are "NO",  then may proceed with Cephalosporin use.     Antimicrobials this admission: Ambisome  9/28>> Flucytosine 9/28>>  Microbiology results: 9/28 BCx: NG <x2 days 9/28 CSFcx: sent 9/25 Crypto Ag detected; titer 1:64, repeat now 20  Thank you for allowing pharmacy to be a part of this patient's care.  Nicoletta Dress, PharmD PGY2 Infectious Disease Pharmacy Resident

## 2019-02-11 ENCOUNTER — Inpatient Hospital Stay (HOSPITAL_COMMUNITY): Payer: BC Managed Care – PPO

## 2019-02-11 LAB — HSV DNA BY PCR (REFERENCE LAB)
HSV 1 DNA: NEGATIVE
HSV 2 DNA: NEGATIVE

## 2019-02-11 LAB — CBC
HCT: 40.9 % (ref 39.0–52.0)
Hemoglobin: 13.6 g/dL (ref 13.0–17.0)
MCH: 27.4 pg (ref 26.0–34.0)
MCHC: 33.3 g/dL (ref 30.0–36.0)
MCV: 82.5 fL (ref 80.0–100.0)
Platelets: 204 10*3/uL (ref 150–400)
RBC: 4.96 MIL/uL (ref 4.22–5.81)
RDW: 13.7 % (ref 11.5–15.5)
WBC: 11.3 10*3/uL — ABNORMAL HIGH (ref 4.0–10.5)
nRBC: 0 % (ref 0.0–0.2)

## 2019-02-11 LAB — RENAL FUNCTION PANEL
Albumin: 3.1 g/dL — ABNORMAL LOW (ref 3.5–5.0)
Anion gap: 7 (ref 5–15)
BUN: 6 mg/dL (ref 6–20)
CO2: 23 mmol/L (ref 22–32)
Calcium: 8.9 mg/dL (ref 8.9–10.3)
Chloride: 109 mmol/L (ref 98–111)
Creatinine, Ser: 0.99 mg/dL (ref 0.61–1.24)
GFR calc Af Amer: >60 mL/min (ref >60)
GFR calc non Af Amer: >60 mL/min (ref >60)
Glucose, Bld: 108 mg/dL — ABNORMAL HIGH (ref 70–99)
Phosphorus: 3.9 mg/dL (ref 2.5–4.6)
Potassium: 3.4 mmol/L — ABNORMAL LOW (ref 3.5–5.1)
Sodium: 139 mmol/L (ref 135–145)

## 2019-02-11 LAB — MAGNESIUM: Magnesium: 2.2 mg/dL (ref 1.7–2.4)

## 2019-02-11 LAB — CSF CELL COUNT WITH DIFFERENTIAL
Lymphs, CSF: 92 % — ABNORMAL HIGH (ref 40–80)
Monocyte-Macrophage-Spinal Fluid: 6 % — ABNORMAL LOW (ref 15–45)
RBC Count, CSF: 2 /mm3 — ABNORMAL HIGH
Segmented Neutrophils-CSF: 2 % (ref 0–6)
Tube #: 3
WBC, CSF: 198 /mm3 (ref 0–5)

## 2019-02-11 LAB — PATHOLOGIST SMEAR REVIEW

## 2019-02-11 LAB — PROTEIN, CSF: Total  Protein, CSF: 70 mg/dL — ABNORMAL HIGH (ref 15–45)

## 2019-02-11 LAB — GLUCOSE, CSF: Glucose, CSF: 24 mg/dL — CL (ref 40–70)

## 2019-02-11 MED ORDER — POTASSIUM CHLORIDE CRYS ER 20 MEQ PO TBCR
40.0000 meq | EXTENDED_RELEASE_TABLET | Freq: Three times a day (TID) | ORAL | Status: DC
  Administered 2019-02-11 – 2019-02-15 (×11): 40 meq via ORAL
  Filled 2019-02-11 (×11): qty 2

## 2019-02-11 MED ORDER — POTASSIUM CHLORIDE CRYS ER 20 MEQ PO TBCR
40.0000 meq | EXTENDED_RELEASE_TABLET | ORAL | Status: AC
  Administered 2019-02-11 (×2): 40 meq via ORAL
  Filled 2019-02-11 (×2): qty 2

## 2019-02-11 MED ORDER — LIDOCAINE HCL (PF) 1 % IJ SOLN
5.0000 mL | Freq: Once | INTRAMUSCULAR | Status: AC
  Administered 2019-02-11: 5 mL via INTRADERMAL

## 2019-02-11 NOTE — Progress Notes (Signed)
Pharmacy Antibiotic Note  David Whitehead is a 48 y.o. 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 Pharmacy has been consulted for amphotericin B liposome dosing. Ambisome 3 mg/kg and flucytosine 25 mg/kg started in this patient with suspected cryptococcal meningitis. NS 553mL ordered to be given an hour before and an hour after the Ambisome infusion with D5 flushes administered inbetween. The pharmacy team will monitor the patients renal function, potassium and magnesium and make adjustments per protocol.  Today, potassium is at the lower end of normal but climbing at 3.4 despite 80 mEqs of K yesterday. I have added 40 mEqs of potassium BID to be scheduled for him. Magnesium is normal at 2.2. Scr has increased slightly from 0.8 to 0.99.  Plan: Continue Ambisome 250 mg q24h (3 mg/kg) Continue Flucytosine 2,500 mg q6h (25 mg/kg) Scheduled potassium 40 mEq TID Replace with 80 mEq in addition to the 80 mEqs that is scheduled already for today Continue to replace Mg and K as needed Monitor Scr   Height: 5\' 10"  (177.8 cm) Weight: 208 lb 6.1 oz (94.5 kg) IBW/kg (Calculated) : 73  Temp (24hrs), Avg:98.8 F (37.1 C), Min:98.4 F (36.9 C), Max:99.3 F (37.4 C)  Recent Labs  Lab 02/07/19 1520 02/08/19 0604 02/09/19 0446 02/10/19 0414 02/11/19 0422  WBC 13.2* 12.3* 11.4*  --  11.3*  CREATININE 0.97 0.84 0.85 0.80 0.99  LATICACIDVEN 1.3  --   --   --   --     Estimated Creatinine Clearance: 108.8 mL/min (by C-G formula based on SCr of 0.99 mg/dL).    Allergies  Allergen Reactions  . Bactrim [Sulfamethoxazole-Trimethoprim] Rash  . Latex Other (See Comments)    Took skin off when removed tape  . Amoxicillin Rash    Has patient had a PCN reaction causing immediate rash, facial/tongue/throat swelling, SOB or lightheadedness with hypotension: No Has patient had a PCN reaction causing severe rash involving mucus membranes or skin necrosis: No Has patient had a PCN reaction  that required hospitalization: No Has patient had a PCN reaction occurring within the last 10 years: No If all of the above answers are "NO", then may proceed with Cephalosporin use.     Antimicrobials this admission: Ambisome  9/28>> Flucytosine 9/28>>  Microbiology results: 9/28 BCx: NG <x2 days 9/28 CSFcx: sent 9/25 Crypto Ag detected; titer 1:64, repeat now 20  Thank you for allowing pharmacy to be a part of this patient's care.  Nicoletta Dress, PharmD PGY2 Infectious Disease Pharmacy Resident

## 2019-02-11 NOTE — Progress Notes (Signed)
Patient ID: David Whitehead, male   DOB: Sep 04, 1970, 48 y.o.   MRN: [id number]  PROGRESS NOTE    David Whitehead  [id number] DOB: February 02, 1971 DOA: 02/07/2019 PCP: [name], MD   Brief Narrative:  48 year old male with history of CLL, hypertension, diabetes mellitus type 2 presented with subacute headache and diplopia.  He was recently evaluated by neurology and subsequently had LP done which showed opening pressure of 55.  His cryptococcal antigen titers came back positive at 1:64.  He was admitted for cryptococcal meningitis.  ID was consulted.  He was started on amphotericin B and flucytosine.  02/09/2019: Patient underwent repeat lumbar puncture today.  Opening pressure of 28 cm water was noted 14 cc of CSF was taken out.  Patient reported nausea and vomiting today.  Headache is improved.  Visual symptoms have improved.  Input from infectious disease is appreciated.  Awaiting input from heme-onc team.  02/10/2019: Patient seen.  No new complaints.  Nausea and vomiting have resolved.  Potassium of 3.4 is noted, will replete.  For repeat LP today as per infectious disease team based on opening pressure yesterday (28 cmH2O).  02/11/2019: Lumbar puncture was not done yesterday.  Patient continues to improve.  Headache is resolved.  Visual symptoms have resolved.  For lumbar puncture later today.  Assessment & Plan: Cryptococcal meningitis: -Patient had a recent outpatient LP which had shown opening pressure of 55 and his cryptococcal antigen titers came back positive at 1:64 -Started on amphotericin B and flucytosine by ID.  Continue the same. -ID recommends daily LPs until normal opening pressures x2 -IR has been consulted: Apparently LP cannot be done today because patient received Lovenox last night.  Will hold Lovenox. -Continue monitoring electrolytes and renal function while using amphotericin B. -Follow further ID recommendations. 02/09/2019: Continue amphotericin B and  flucytosine.  Infectious disease is directing care. 02/11/2019: For lumbar puncture today.  Continue to monitor opening pressures.  Blood puncture was not done yesterday.  CLL -Spoke with Dr. Zhao/oncology on phone on 02/08/2019.  -He is okay for ibrutinib to be on hold for now. -Continue atovaquone and acyclovir. 02/09/2019: Awaiting heme-onc input.  Hypertension -Monitor blood pressure.  Continue metoprolol  Pulmonary nodule -Possibly source of his cryptococcus as per ID.  DVT prophylaxis: We will hold Lovenox.  SCDs Code Status: Full Family Communication: None at bedside Disposition Plan: We will probably need to be inpatient for at least 2 weeks for IV amphotericin.  Consultants: ID  Procedures: None  Antimicrobials: Amphotericin B and flucytosine from 02/07/2019 onwards Outpatient acyclovir and atovaquone being continued.  Subjective: No nausea or vomiting. Headache has improved significantly. No fever or chills. No shortness of breath  Objective: Vitals:   02/10/19 0850 02/10/19 1831 02/11/19 0531 02/11/19 0851  BP: (!) 174/93 (!) 141/88 (!) 148/99 (!) 136/97  Pulse: 83 84 78 93  Resp: 18 18 18 18   Temp: 97.7 F (36.5 C) 99.3 F (37.4 C) 98.4 F (36.9 C) 98.7 F (37.1 C)  TempSrc: Oral Oral Oral Oral  SpO2: 100% 100% 99% 100%  Weight:      Height:        Intake/Output Summary (Last 24 hours) at 02/11/2019 1041 Last data filed at 02/11/2019 0531 Gross per 24 hour  Intake 460 ml  Output 0 ml  Net 460 ml   Filed Weights   02/08/19 0349 02/08/19 2134 02/09/19 2046  Weight: 94.5 kg 94.5 kg 94.5 kg    Examination:  General exam: Appears calm  and comfortable  Respiratory system: Bilateral decreased breath sounds at bases Cardiovascular system: S1 & S2 heard, Rate controlled Gastrointestinal system: Abdomen is nondistended, soft and nontender. Normal bowel sounds heard. Extremities: No cyanosis, clubbing, edema    Data Reviewed: I have personally  reviewed following labs and imaging studies  CBC: Recent Labs  Lab 02/07/19 1520 02/08/19 0604 02/09/19 0446 02/11/19 0422  WBC 13.2* 12.3* 11.4* 11.3*  NEUTROABS 4.5  --  6.5  --   HGB 14.9 14.5 13.9 13.6  HCT 45.9 43.9 40.7 40.9  MCV 83.6 82.2 82.2 82.5  PLT 255 240 214 0000000   Basic Metabolic Panel: Recent Labs  Lab 02/07/19 1520 02/08/19 0604 02/09/19 0446 02/10/19 0414 02/11/19 0422  NA 137 138 137 140 139  K 3.7 3.7 3.1* 3.3* 3.4*  CL 101 107 106 111 109  CO2 26 21* 21* 22 23  GLUCOSE 103* 120* 115* 111* 108*  BUN 9 8 6 6 6   CREATININE 0.97 0.84 0.85 0.80 0.99  CALCIUM 8.8* 8.8* 8.6* 8.8* 8.9  MG  --  2.1 2.0 2.2 2.2  PHOS  --   --   --   --  3.9   GFR: Estimated Creatinine Clearance: 108.8 mL/min (by C-G formula based on SCr of 0.99 mg/dL). Liver Function Tests: Recent Labs  Lab 02/07/19 1520 02/08/19 0604 02/09/19 0446 02/11/19 0422  AST 14* 14* 12*  --   ALT 27 23 22   --   ALKPHOS 30* 28* 30*  --   BILITOT 0.4 0.9 0.6  --   PROT 6.1* 5.6* 5.5*  --   ALBUMIN 3.9 3.5 3.3* 3.1*   No results for input(s): LIPASE, AMYLASE in the last 168 hours. No results for input(s): AMMONIA in the last 168 hours. Coagulation Profile: Recent Labs  Lab 02/07/19 1520  INR 1.0   Cardiac Enzymes: No results for input(s): CKTOTAL, CKMB, CKMBINDEX, TROPONINI in the last 168 hours. BNP (last 3 results) No results for input(s): PROBNP in the last 8760 hours. HbA1C: No results for input(s): HGBA1C in the last 72 hours. CBG: No results for input(s): GLUCAP in the last 168 hours. Lipid Profile: No results for input(s): CHOL, HDL, LDLCALC, TRIG, CHOLHDL, LDLDIRECT in the last 72 hours. Thyroid Function Tests: No results for input(s): TSH, T4TOTAL, FREET4, T3FREE, THYROIDAB in the last 72 hours. Anemia Panel: No results for input(s): VITAMINB12, FOLATE, FERRITIN, TIBC, IRON, RETICCTPCT in the last 72 hours. Sepsis Labs: Recent Labs  Lab 02/07/19 1520  LATICACIDVEN  1.3    Recent Results (from the past 240 hour(s))  CSF culture     Status: Abnormal   Collection Time: 02/04/19  9:30 AM   Specimen: Lumbar Puncture; Cerebrospinal Fluid  Result Value Ref Range Status   MICRO NUMBER: [id number]  Final   SPECIMEN QUALITY: Adequate  Final   Source CEREBROSPINAL FLUID (CSF)  Final   STATUS: FINAL  Final   GRAM STAIN: (A)  Final    Many White blood cells seen No organisms seen Gram stain prepared by cytospin   ISOLATE 1: Cryptococcus neoformans (AA)  Final    Comment: Scant growth of Cryptococcus neoformans   MYCOBACTERIA, CULTURE, WITH FLUOROCHROME SMEAR     Status: None (Preliminary result)   Collection Time: 02/04/19  9:30 AM  Result Value Ref Range Status   MICRO NUMBER: [id number]  Preliminary   SPECIMEN QUALITY: Adequate  Preliminary   Source: CEREBROSPINAL FLUID (CSF)  Preliminary   STATUS: PRELIMINARY  Preliminary  SMEAR: No acid fast bacilli seen.  Preliminary   RESULT:   Preliminary    Culture results to follow. Final reports of negative cultures can be expected in approximately six weeks. Positive cultures are reported immediately.  Culture, blood (Routine x 2)     Status: None (Preliminary result)   Collection Time: 02/07/19  1:50 PM   Specimen: BLOOD LEFT ARM  Result Value Ref Range Status   Specimen Description BLOOD LEFT ARM  Final   Special Requests   Final    BOTTLES DRAWN AEROBIC AND ANAEROBIC Blood Culture results may not be optimal due to an excessive volume of blood received in culture bottles   Culture   Final    NO GROWTH 4 DAYS Performed at [facility], [place] [address], [place], [place] [address]    Report Status PENDING  Incomplete  Culture, blood (Routine x 2)     Status: None (Preliminary result)   Collection Time: 02/07/19  3:06 PM   Specimen: BLOOD RIGHT ARM  Result Value Ref Range Status   Specimen Description BLOOD RIGHT ARM  Final   Special Requests   Final    BOTTLES DRAWN AEROBIC AND ANAEROBIC Blood Culture  results may not be optimal due to an excessive volume of blood received in culture bottles   Culture   Final    NO GROWTH 4 DAYS Performed at [facility], [place] [address], [place], [place] [address]    Report Status PENDING  Incomplete  SARS CORONAVIRUS 2 (TAT 6-24 HRS) Nasopharyngeal Nasopharyngeal Swab     Status: None   Collection Time: 02/07/19  3:54 PM   Specimen: Nasopharyngeal Swab  Result Value Ref Range Status   SARS Coronavirus 2 NEGATIVE NEGATIVE Final    Comment: (NOTE) SARS-CoV-2 target nucleic acids are NOT DETECTED. The SARS-CoV-2 RNA is generally detectable in upper and lower respiratory specimens during the acute phase of infection. Negative results do not preclude SARS-CoV-2 infection, do not rule out co-infections with other pathogens, and should not be used as the sole basis for treatment or other patient management decisions. Negative results must be combined with clinical observations, patient history, and epidemiological information. The expected result is Negative. Fact Sheet for Patients: SugarRoll.be Fact Sheet for Healthcare Providers: https://www.woods-mathews.com/ This test is not yet approved or cleared by the [place] FDA and  has been authorized for detection and/or diagnosis of SARS-CoV-2 by FDA under an Emergency Use Authorization (EUA). This EUA will remain  in effect (meaning this test can be used) for the duration of the COVID-19 declaration under Section 56 4(b)(1) of the Act, 21 U.S.C. section 360bbb-3(b)(1), unless the authorization is terminated or revoked sooner. Performed at [facility], [place] [address], [place], [place] [address]   CSF culture     Status: None (Preliminary result)   Collection Time: 02/09/19 11:36 AM   Specimen: PATH Cytology CSF; Cerebrospinal Fluid  Result Value Ref Range Status   Specimen Description CSF  Final   Special Requests NONE  Final   Gram Stain    Final    WBC PRESENT,BOTH PMN AND MONONUCLEAR NO ORGANISMS SEEN CYTOSPIN SMEAR    Culture   Final    NO GROWTH 2 DAYS Performed at [facility], [place] [address] [place], [place] [address]    Report Status PENDING  Incomplete         Radiology Studies: Dg Fl Guided Lumbar Puncture  Result Date: 02/09/2019 CLINICAL DATA:  48 year old male with history of meningitis. EXAM: DIAGNOSTIC LUMBAR PUNCTURE UNDER FLUOROSCOPIC GUIDANCE FLUOROSCOPY TIME:  Fluoroscopy Time:  24 seconds Radiation Exposure Index (if provided by the fluoroscopic device): 2.7 mGy PROCEDURE: Informed consent was obtained from the patient prior to the procedure, including potential complications of headache, allergy, and pain. With the patient prone, the lower back was prepped with Betadine. 1% Lidocaine was used for local anesthesia. Lumbar puncture was performed at the L3-L4 level using a 20 gauge needle with return of clear CSF with an opening pressure of 28 cm water. 14 ml of CSF were obtained for laboratory studies. The patient tolerated the procedure well and there were no apparent complications. IMPRESSION: 1. Successful uncomplicated fluoroscopic guided lumbar puncture, as above. Electronically Signed   By: Vinnie Langton M.D.   On: 02/09/2019 11:52        Scheduled Meds:  acyclovir  400 mg Oral BID   Chlorhexidine Gluconate Cloth  6 each Topical Daily   dextrose  10 mL Intravenous Q24H   dextrose  10 mL Intravenous Q24H   flucytosine  25 mg/kg Oral Q6H   loratadine  10 mg Oral Daily   metoprolol tartrate  12.5 mg Oral BID   potassium chloride  40 mEq Oral BID   sodium chloride  500 mL Intravenous Q24H   sodium chloride  500 mL Intravenous Q24H   Continuous Infusions:  sodium chloride 75 mL/hr at 02/11/19 0521   amphotericin  B  Liposome (AMBISOME) ADULT IV 250 mg (02/10/19 1820)    Bonnell Public, MD [facility] 02/11/2019, 10:41 AM

## 2019-02-11 NOTE — Progress Notes (Signed)
[facility]  Date of Admission:  02/07/2019      Total days of antibiotics 4  Day 4 flucytosine and L-ampho            ASSESSMENT: David Whitehead is a 48 y.o. 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 Oncology recommending to hold his chemo for now with treatment of serious fungal infection.   Continue flucytosine + L-ampho with pre-/post- hydration. According to IDSA guidelines managing invasive cryptococcal disease he will need longer induction therapy with 4 weeks of IV therapy before transitioning to oral fluconazole.   Will keep him NPO and arrange for IR to get LP done today.    PLAN: 1. LP to be done today and likely needs one over the weekend.  2. Continue antifungals and pre/post hydration  3. Electrolyte replacement / monitoring per pharmacy protocol.     ADDENDUM: 1:17 PM opening pressure again noted to be elevated @28  cm H20. His vision symptoms are improved but headache intensity varies. Per recommended guidelines, he should get another LP tomorrow 10/03.  If still persistently elevated may need to consult neurosurgery team in consideration of lumbar drain to facilitate pressure management. If not managed correctly and timely at risk for blindness/deafness which would be devastating.    Principal Problem:   Cryptococcal meningitis ([place]) Active Problems:   CLL (chronic lymphocytic leukemia) (H[facility])   Pulmonary nodule   Hypertension   Meningitis   . acyclovir  400 mg Oral BID  . Chlorhexidine Gluconate Cloth  6 each Topical Daily  . dextrose  10 mL Intravenous Q24H  . dextrose  10 mL Intravenous Q24H  . flucytosine  25 mg/kg Oral Q6H  . loratadine  10 mg Oral Daily  . metoprolol tartrate  12.5 mg Oral BID  . potassium chloride  40 mEq Oral Q4H  . potassium chloride  40 mEq Oral TID  . sodium chloride  500 mL Intravenous Q24H  . sodium chloride  500 mL  Intravenous Q24H    SUBJECTIVE: Double vision has not returned. Frontal/temporal headaches wax and wane. Tolerating antibiotics well today.   Interval additions - Mg 2.2, K 3.4, Cr. 0.99 (sl increase from yesterday)  Review of Systems: Review of Systems  Constitutional: Negative for chills and fever.  HENT: Negative for hearing loss and tinnitus.   Eyes: Negative for blurred vision, double vision, photophobia and pain.  Cardiovascular: Negative for chest pain and leg swelling.  Musculoskeletal: Negative for neck pain.  Neurological: Positive for headaches (frontal and b/l temporal region). Negative for dizziness (once described above) and weakness.    Allergies  Allergen Reactions  . Bactrim [Sulfamethoxazole-Trimethoprim] Rash  . Latex Other (See Comments)    Took skin off when removed tape  . Amoxicillin Rash    Has patient had a PCN reaction causing immediate rash, facial/tongue/throat swelling, SOB or lightheadedness with hypotension: No Has patient had a PCN reaction causing severe rash involving mucus membranes or skin necrosis: No Has patient had a PCN reaction that required hospitalization: No Has patient had a PCN reaction occurring within the last 10 years: No If all of the above answers are "NO", then may proceed with Cephalosporin use.     OBJECTIVE: Vitals:   02/10/19 0850 02/10/19 1831 02/11/19 0531 02/11/19 0851  BP: (!) 174/93 (!) 141/88 (!) 148/99 (!) 136/97  Pulse: 83 84 78 93  Resp: 18 18  18 18  Temp: 97.7 F (36.5 C) 99.3 F (37.4 C) 98.4 F (36.9 C) 98.7 F (37.1 C)  TempSrc: Oral Oral Oral Oral  SpO2: 100% 100% 99% 100%  Weight:      Height:       Body mass index is 29.9 kg/m.  Physical Exam Constitutional:      Comments: Resting comfortably in bed watching TV  HENT:     Mouth/Throat:     Mouth: Mucous membranes are moist.     Pharynx: Oropharynx is clear.  Neck:     Musculoskeletal: No neck rigidity or muscular tenderness.   Cardiovascular:     Rate and Rhythm: Normal rate and regular rhythm.  Pulmonary:     Effort: Pulmonary effort is normal.     Breath sounds: Normal breath sounds.  Abdominal:     General: There is no distension.     Tenderness: There is no abdominal tenderness.  Musculoskeletal:     Comments: LUE PICC In place with clean/dry dressing  Skin:    General: Skin is warm and dry.     Capillary Refill: Capillary refill takes less than 2 seconds.  Neurological:     Mental Status: He is alert and oriented to person, place, and time.     Lab Results Lab Results  Component Value Date   WBC 11.3 (H) 02/11/2019   HGB 13.6 02/11/2019   HCT 40.9 02/11/2019   MCV 82.5 02/11/2019   PLT 204 02/11/2019    Lab Results  Component Value Date   CREATININE 0.99 02/11/2019   BUN 6 02/11/2019   NA 139 02/11/2019   K 3.4 (L) 02/11/2019   CL 109 02/11/2019   CO2 23 02/11/2019    Lab Results  Component Value Date   ALT 22 02/09/2019   AST 12 (L) 02/09/2019   ALKPHOS 30 (L) 02/09/2019   BILITOT 0.6 02/09/2019     Microbiology: Recent Results (from the past 240 hour(s))  CSF culture     Status: Abnormal   Collection Time: 02/04/19  9:30 AM   Specimen: Lumbar Puncture; Cerebrospinal Fluid  Result Value Ref Range Status   MICRO NUMBER: [id number]  Final   SPECIMEN QUALITY: Adequate  Final   Source CEREBROSPINAL FLUID (CSF)  Final   STATUS: FINAL  Final   GRAM STAIN: (A)  Final    Many White blood cells seen No organisms seen Gram stain prepared by cytospin   ISOLATE 1: Cryptococcus neoformans (AA)  Final    Comment: Scant growth of Cryptococcus neoformans   MYCOBACTERIA, CULTURE, WITH FLUOROCHROME SMEAR     Status: None (Preliminary result)   Collection Time: 02/04/19  9:30 AM  Result Value Ref Range Status   MICRO NUMBER: [id number]  Preliminary   SPECIMEN QUALITY: Adequate  Preliminary   Source: CEREBROSPINAL FLUID (CSF)  Preliminary   STATUS: PRELIMINARY  Preliminary   SMEAR: No acid  fast bacilli seen.  Preliminary   RESULT:   Preliminary    Culture results to follow. Final reports of negative cultures can be expected in approximately six weeks. Positive cultures are reported immediately.  Culture, blood (Routine x 2)     Status: None (Preliminary result)   Collection Time: 02/07/19  1:50 PM   Specimen: BLOOD LEFT ARM  Result Value Ref Range Status   Specimen Description BLOOD LEFT ARM  Final   Special Requests   Final    BOTTLES DRAWN AEROBIC AND ANAEROBIC Blood Culture results may not be optimal  due to an excessive volume of blood received in culture bottles   Culture   Final    NO GROWTH 4 DAYS Performed at [facility], [place] [address] [place], [place] [address]    Report Status PENDING  Incomplete  Culture, blood (Routine x 2)     Status: None (Preliminary result)   Collection Time: 02/07/19  3:06 PM   Specimen: BLOOD RIGHT ARM  Result Value Ref Range Status   Specimen Description BLOOD RIGHT ARM  Final   Special Requests   Final    BOTTLES DRAWN AEROBIC AND ANAEROBIC Blood Culture results may not be optimal due to an excessive volume of blood received in culture bottles   Culture   Final    NO GROWTH 4 DAYS Performed at [facility], [place] [address], [place], [place] [address]    Report Status PENDING  Incomplete  SARS CORONAVIRUS 2 (TAT 6-24 HRS) Nasopharyngeal Nasopharyngeal Swab     Status: None   Collection Time: 02/07/19  3:54 PM   Specimen: Nasopharyngeal Swab  Result Value Ref Range Status   SARS Coronavirus 2 NEGATIVE NEGATIVE Final    Comment: (NOTE) SARS-CoV-2 target nucleic acids are NOT DETECTED. The SARS-CoV-2 RNA is generally detectable in upper and lower respiratory specimens during the acute phase of infection. Negative results do not preclude SARS-CoV-2 infection, do not rule out co-infections with other pathogens, and should not be used as the sole basis for treatment or other patient management decisions. Negative results  must be combined with clinical observations, patient history, and epidemiological information. The expected result is Negative. Fact Sheet for Patients: SugarRoll.be Fact Sheet for Healthcare Providers: https://www.woods-mathews.com/ This test is not yet approved or cleared by the [place] FDA and  has been authorized for detection and/or diagnosis of SARS-CoV-2 by FDA under an Emergency Use Authorization (EUA). This EUA will remain  in effect (meaning this test can be used) for the duration of the COVID-19 declaration under Section 56 4(b)(1) of the Act, 21 U.S.C. section 360bbb-3(b)(1), unless the authorization is terminated or revoked sooner. Performed at [facility], [place] [address] [place], [place] [address]   CSF culture     Status: None (Preliminary result)   Collection Time: 02/09/19 11:36 AM   Specimen: PATH Cytology CSF; Cerebrospinal Fluid  Result Value Ref Range Status   Specimen Description CSF  Final   Special Requests NONE  Final   Gram Stain   Final    WBC PRESENT,BOTH PMN AND MONONUCLEAR NO ORGANISMS SEEN CYTOSPIN SMEAR    Culture   Final    NO GROWTH 2 DAYS Performed at [facility], [place] [address], [place], [place] [address]    Report Status PENDING  Incomplete    Janene Madeira, MSN, NP-C [facility] [email or web address] Pager: [phone] Office: [place]: [phone]   02/11/2019  1:14 PM

## 2019-02-12 ENCOUNTER — Other Ambulatory Visit: Payer: Self-pay | Admitting: Diagnostic Radiology

## 2019-02-12 ENCOUNTER — Inpatient Hospital Stay (HOSPITAL_COMMUNITY): Payer: BC Managed Care – PPO

## 2019-02-12 LAB — CSF CELL COUNT WITH DIFFERENTIAL
Lymphs, CSF: 87 % — ABNORMAL HIGH (ref 40–80)
Monocyte-Macrophage-Spinal Fluid: 7 % — ABNORMAL LOW (ref 15–45)
RBC Count, CSF: 91 /mm3 — ABNORMAL HIGH
Segmented Neutrophils-CSF: 6 % (ref 0–6)
Tube #: 1
WBC, CSF: 63 /mm3 (ref 0–5)

## 2019-02-12 LAB — CBC
HCT: 43.9 % (ref 39.0–52.0)
Hemoglobin: 14.4 g/dL (ref 13.0–17.0)
MCH: 27.2 pg (ref 26.0–34.0)
MCHC: 32.8 g/dL (ref 30.0–36.0)
MCV: 82.8 fL (ref 80.0–100.0)
Platelets: 201 10*3/uL (ref 150–400)
RBC: 5.3 MIL/uL (ref 4.22–5.81)
RDW: 13.5 % (ref 11.5–15.5)
WBC: 12.5 10*3/uL — ABNORMAL HIGH (ref 4.0–10.5)
nRBC: 0 % (ref 0.0–0.2)

## 2019-02-12 LAB — CULTURE, BLOOD (ROUTINE X 2)
Culture: NO GROWTH
Culture: NO GROWTH

## 2019-02-12 LAB — RENAL FUNCTION PANEL
Albumin: 3.4 g/dL — ABNORMAL LOW (ref 3.5–5.0)
Anion gap: 8 (ref 5–15)
BUN: 7 mg/dL (ref 6–20)
CO2: 22 mmol/L (ref 22–32)
Calcium: 9.1 mg/dL (ref 8.9–10.3)
Chloride: 110 mmol/L (ref 98–111)
Creatinine, Ser: 0.84 mg/dL (ref 0.61–1.24)
GFR calc Af Amer: >60 mL/min (ref >60)
GFR calc non Af Amer: >60 mL/min (ref >60)
Glucose, Bld: 105 mg/dL — ABNORMAL HIGH (ref 70–99)
Phosphorus: 4.5 mg/dL (ref 2.5–4.6)
Potassium: 3.9 mmol/L (ref 3.5–5.1)
Sodium: 140 mmol/L (ref 135–145)

## 2019-02-12 LAB — PROTEIN, CSF: Total  Protein, CSF: 93 mg/dL — ABNORMAL HIGH (ref 15–45)

## 2019-02-12 LAB — MAGNESIUM: Magnesium: 2.3 mg/dL (ref 1.7–2.4)

## 2019-02-12 LAB — GLUCOSE, CSF: Glucose, CSF: 24 mg/dL — CL (ref 40–70)

## 2019-02-12 LAB — GLUCOSE, CAPILLARY: Glucose-Capillary: 98 mg/dL (ref 70–99)

## 2019-02-12 NOTE — Progress Notes (Signed)
Patient ID: Hananiah Sestito, male   DOB: 1970-08-31, 48 y.o.   MRN: [id number]  PROGRESS NOTE    Tyair Lohmeyer  [id number] DOB: December 13, 1970 DOA: 02/07/2019 PCP: [name], MD   Brief Narrative:  48 year old male with history of CLL, hypertension, diabetes mellitus type 2 presented with subacute headache and diplopia.  He was recently evaluated by neurology and subsequently had LP done which showed opening pressure of 55.  His cryptococcal antigen titers came back positive at 1:64.  He was admitted for cryptococcal meningitis.  ID was consulted.  He was started on amphotericin B and flucytosine.  02/09/2019: Patient underwent repeat lumbar puncture today.  Opening pressure of 28 cm water was noted 14 cc of CSF was taken out.  Patient reported nausea and vomiting today.  Headache is improved.  Visual symptoms have improved.  Input from infectious disease is appreciated.  Awaiting input from heme-onc team.  02/12/2019: Patient seen.  No complaints.  No fever or chills.  No headache, no blurry vision.  Infectious disease input is appreciated.  ID team has advised consulting neurosurgery for temporary lumbar drain.    Assessment & Plan: Cryptococcal meningitis: -Patient had a recent outpatient LP which had shown opening pressure of 55 and his cryptococcal antigen titers came back positive at 1:64 -Started on amphotericin B and flucytosine by ID.  Continue the same. -ID recommends daily LPs until normal opening pressures x2 -IR has been consulted: Apparently LP cannot be done today because patient received Lovenox last night.  Will hold Lovenox. -Continue monitoring electrolytes and renal function while using amphotericin B. -Follow further ID recommendations. 02/09/2019: Continue amphotericin B and flucytosine.  Infectious disease is directing care. 02/11/2019: For lumbar puncture today.  Continue to monitor opening pressures.  Blood puncture was not done yesterday. 02/12/2019: See  above.  CLL -Spoke with Dr. Zhao/oncology on phone on 02/08/2019.  -He is okay for ibrutinib to be on hold for now. -Continue atovaquone and acyclovir.  Hypertension -Monitor blood pressure.  Continue metoprolol  Pulmonary nodule -Possibly source of his cryptococcus as per ID.  DVT prophylaxis: We will hold Lovenox.  SCDs Code Status: Full Family Communication: None at bedside Disposition Plan: We will probably need to be inpatient for at least 2 weeks for IV amphotericin.  Consultants: ID  Procedures: None  Antimicrobials: Amphotericin B and flucytosine from 02/07/2019 onwards Outpatient acyclovir and atovaquone being continued.  Subjective: No nausea or vomiting. Headache has improved significantly. No fever or chills. No shortness of breath  Objective: Vitals:   02/11/19 1814 02/11/19 2042 02/12/19 0401 02/12/19 0939  BP: (!) 187/83 (!) 134/92 126/87 (!) 142/91  Pulse: 67 79 83 77  Resp: 18 18 18 18   Temp:  98.8 F (37.1 C) 99.5 F (37.5 C) 98.4 F (36.9 C)  TempSrc:  Oral Oral Oral  SpO2: 100% 96% 98% 100%  Weight:  95.2 kg 93.1 kg   Height:        Intake/Output Summary (Last 24 hours) at 02/12/2019 1445 Last data filed at 02/12/2019 1000 Gross per 24 hour  Intake 440 ml  Output 0 ml  Net 440 ml   Filed Weights   02/09/19 2046 02/11/19 2042 02/12/19 0401  Weight: 94.5 kg 95.2 kg 93.1 kg    Examination:  General exam: Appears calm and comfortable  Respiratory system: Bilateral decreased breath sounds at bases Cardiovascular system: S1 & S2 heard, Rate controlled Gastrointestinal system: Abdomen is nondistended, soft and nontender. Normal bowel sounds heard. Extremities: No  cyanosis, clubbing, edema    Data Reviewed: I have personally reviewed following labs and imaging studies  CBC: Recent Labs  Lab 02/07/19 1520 02/08/19 0604 02/09/19 0446 02/11/19 0422 02/12/19 0336  WBC 13.2* 12.3* 11.4* 11.3* 12.5*  NEUTROABS 4.5  --  6.5  --   --    HGB 14.9 14.5 13.9 13.6 14.4  HCT 45.9 43.9 40.7 40.9 43.9  MCV 83.6 82.2 82.2 82.5 82.8  PLT 255 240 214 204 [id number]   Basic Metabolic Panel: Recent Labs  Lab 02/08/19 0604 02/09/19 0446 02/10/19 0414 02/11/19 0422 02/12/19 0336  NA 138 137 140 139 140  K 3.7 3.1* 3.3* 3.4* 3.9  CL 107 106 111 109 110  CO2 21* 21* 22 23 22   GLUCOSE 120* 115* 111* 108* 105*  BUN 8 6 6 6 7   CREATININE 0.84 0.85 0.80 0.99 0.84  CALCIUM 8.8* 8.6* 8.8* 8.9 9.1  MG 2.1 2.0 2.2 2.2 2.3  PHOS  --   --   --  3.9 4.5   GFR: Estimated Creatinine Clearance: 127.2 mL/min (by C-G formula based on SCr of 0.84 mg/dL). Liver Function Tests: Recent Labs  Lab 02/07/19 1520 02/08/19 0604 02/09/19 0446 02/11/19 0422 02/12/19 0336  AST 14* 14* 12*  --   --   ALT 27 23 22   --   --   ALKPHOS 30* 28* 30*  --   --   BILITOT 0.4 0.9 0.6  --   --   PROT 6.1* 5.6* 5.5*  --   --   ALBUMIN 3.9 3.5 3.3* 3.1* 3.4*   No results for input(s): LIPASE, AMYLASE in the last 168 hours. No results for input(s): AMMONIA in the last 168 hours. Coagulation Profile: Recent Labs  Lab 02/07/19 1520  INR 1.0   Cardiac Enzymes: No results for input(s): CKTOTAL, CKMB, CKMBINDEX, TROPONINI in the last 168 hours. BNP (last 3 results) No results for input(s): PROBNP in the last 8760 hours. HbA1C: No results for input(s): HGBA1C in the last 72 hours. CBG: Recent Labs  Lab 02/12/19 1221  GLUCAP 98   Lipid Profile: No results for input(s): CHOL, HDL, LDLCALC, TRIG, CHOLHDL, LDLDIRECT in the last 72 hours. Thyroid Function Tests: No results for input(s): TSH, T4TOTAL, FREET4, T3FREE, THYROIDAB in the last 72 hours. Anemia Panel: No results for input(s): VITAMINB12, FOLATE, FERRITIN, TIBC, IRON, RETICCTPCT in the last 72 hours. Sepsis Labs: Recent Labs  Lab 02/07/19 1520  LATICACIDVEN 1.3    Recent Results (from the past 240 hour(s))  CSF culture     Status: Abnormal   Collection Time: 02/04/19  9:30 AM   Specimen:  Lumbar Puncture; Cerebrospinal Fluid  Result Value Ref Range Status   MICRO NUMBER: [id number]  Final   SPECIMEN QUALITY: Adequate  Final   Source CEREBROSPINAL FLUID (CSF)  Final   STATUS: FINAL  Final   GRAM STAIN: (A)  Final    Many White blood cells seen No organisms seen Gram stain prepared by cytospin   ISOLATE 1: Cryptococcus neoformans (AA)  Final    Comment: Scant growth of Cryptococcus neoformans   MYCOBACTERIA, CULTURE, WITH FLUOROCHROME SMEAR     Status: None (Preliminary result)   Collection Time: 02/04/19  9:30 AM  Result Value Ref Range Status   MICRO NUMBER: [id number]  Preliminary   SPECIMEN QUALITY: Adequate  Preliminary   Source: CEREBROSPINAL FLUID (CSF)  Preliminary   STATUS: PRELIMINARY  Preliminary   SMEAR: No acid fast bacilli seen.  Preliminary  RESULT:   Preliminary    Culture results to follow. Final reports of negative cultures can be expected in approximately six weeks. Positive cultures are reported immediately.  Culture, blood (Routine x 2)     Status: None   Collection Time: 02/07/19  1:50 PM   Specimen: BLOOD LEFT ARM  Result Value Ref Range Status   Specimen Description BLOOD LEFT ARM  Final   Special Requests   Final    BOTTLES DRAWN AEROBIC AND ANAEROBIC Blood Culture results may not be optimal due to an excessive volume of blood received in culture bottles   Culture   Final    NO GROWTH 5 DAYS Performed at [facility], [place] [address] [place], [place] [address]    Report Status 02/12/2019 FINAL  Final  Culture, blood (Routine x 2)     Status: None   Collection Time: 02/07/19  3:06 PM   Specimen: BLOOD RIGHT ARM  Result Value Ref Range Status   Specimen Description BLOOD RIGHT ARM  Final   Special Requests   Final    BOTTLES DRAWN AEROBIC AND ANAEROBIC Blood Culture results may not be optimal due to an excessive volume of blood received in culture bottles   Culture   Final    NO GROWTH 5 DAYS Performed at [facility], [place] [address], [place], [place] [address]    Report Status 02/12/2019 FINAL  Final  SARS CORONAVIRUS 2 (TAT 6-24 HRS) Nasopharyngeal Nasopharyngeal Swab     Status: None   Collection Time: 02/07/19  3:54 PM   Specimen: Nasopharyngeal Swab  Result Value Ref Range Status   SARS Coronavirus 2 NEGATIVE NEGATIVE Final    Comment: (NOTE) SARS-CoV-2 target nucleic acids are NOT DETECTED. The SARS-CoV-2 RNA is generally detectable in upper and lower respiratory specimens during the acute phase of infection. Negative results do not preclude SARS-CoV-2 infection, do not rule out co-infections with other pathogens, and should not be used as the sole basis for treatment or other patient management decisions. Negative results must be combined with clinical observations, patient history, and epidemiological information. The expected result is Negative. Fact Sheet for Patients: SugarRoll.be Fact Sheet for Healthcare Providers: https://www.woods-mathews.com/ This test is not yet approved or cleared by the [place] FDA and  has been authorized for detection and/or diagnosis of SARS-CoV-2 by FDA under an Emergency Use Authorization (EUA). This EUA will remain  in effect (meaning this test can be used) for the duration of the COVID-19 declaration under Section 56 4(b)(1) of the Act, 21 U.S.C. section 360bbb-3(b)(1), unless the authorization is terminated or revoked sooner. Performed at [facility], [place] [address], [place], [place] [address]   CSF culture     Status: None (Preliminary result)   Collection Time: 02/09/19 11:36 AM   Specimen: PATH Cytology CSF; Cerebrospinal Fluid  Result Value Ref Range Status   Specimen Description CSF  Final   Special Requests NONE  Final   Gram Stain   Final    WBC PRESENT,BOTH PMN AND MONONUCLEAR NO ORGANISMS SEEN CYTOSPIN SMEAR    Culture   Final    NO GROWTH 3 DAYS Performed at [facility], [place]  [address], [place], [place] [address]    Report Status PENDING  Incomplete  CSF culture with Stat gram stain     Status: None (Preliminary result)   Collection Time: 02/11/19 11:32 AM   Specimen: CSF; Cerebrospinal Fluid  Result Value Ref Range  Status   Specimen Description CSF  Final   Special Requests Immunocompromised  Final   Gram Stain   Final    WBC PRESENT, PREDOMINANTLY MONONUCLEAR NO ORGANISMS SEEN CYTOSPIN SMEAR    Culture   Final    NO GROWTH < 24 HOURS Performed at [facility], [place] [address] [place], [place] [address]    Report Status PENDING  Incomplete  CSF culture     Status: None (Preliminary result)   Collection Time: 02/12/19 11:14 AM   Specimen: PATH Cytology CSF; Cerebrospinal Fluid  Result Value Ref Range Status   Specimen Description CSF  Final   Special Requests NONE  Final   Gram Stain   Final    WBC PRESENT, PREDOMINANTLY MONONUCLEAR NO ORGANISMS SEEN CYTOSPIN SMEAR Performed at [facility], [place] [address] [place], [place] [address]    Culture PENDING  Incomplete   Report Status PENDING  Incomplete         Radiology Studies: Dg Fl Guided Lumbar Puncture  Result Date: 02/12/2019 CLINICAL DATA:  Cryptococcal meningitis. EXAM: DIAGNOSTIC LUMBAR PUNCTURE UNDER FLUOROSCOPIC GUIDANCE FLUOROSCOPY TIME:  Fluoroscopy Time:  0.1 minute Radiation Exposure Index (if provided by the fluoroscopic device): 0.4 mGy Number of Acquired Spot Images: 0 PROCEDURE: Informed consent was obtained from the patient prior to the procedure, including potential complications of headache, allergy, and pain. With the patient prone, the lower back was prepped with Betadine. 1% Lidocaine was used for local anesthesia. Lumbar puncture was performed at the L4-5 level using a 22 gauge needle with return of yellow CSF with an opening pressure of 29 cm water. 13 ml of CSF were obtained for laboratory studies. Closing pressure was 12 cm of water. The patient tolerated the  procedure well and there were no apparent complications. IMPRESSION: Successful fluoroscopic guided lumbar puncture. Electronically Signed   By: Kathreen Devoid   On: 02/12/2019 11:58   Dg Fl Guided Lumbar Puncture  Result Date: 02/11/2019 CLINICAL DATA:  Cryptococcal meningitis EXAM: DIAGNOSTIC LUMBAR PUNCTURE UNDER FLUOROSCOPIC GUIDANCE FLUOROSCOPY TIME:  Fluoroscopy Time:  1 minutes Radiation Exposure Index (if provided by the fluoroscopic device): 6.2 mGy Number of Acquired Spot Images: 0 PROCEDURE: Informed consent was obtained from the patient prior to the procedure, including potential complications of headache, allergy, and pain. With the patient prone, the lower back was prepped with Betadine. 1% Lidocaine was used for local anesthesia. Lumbar puncture was performed at the L3-4 level using a 22 gauge needle with return of clear CSF with an opening pressure of 28 cm water. Twenty-one ml of CSF were obtained for laboratory studies. The patient tolerated the procedure well and there were no apparent complications. IMPRESSION: 1. Lumbar puncture with removal of 21 cc of clear CSF. Opening pressure was equal to 28 cm of water. Electronically Signed   By: Kerby Moors M.D.   On: 02/11/2019 11:47        Scheduled Meds:  acyclovir  400 mg Oral BID   Chlorhexidine Gluconate Cloth  6 each Topical Daily   dextrose  10 mL Intravenous Q24H   dextrose  10 mL Intravenous Q24H   flucytosine  25 mg/kg Oral Q6H   loratadine  10 mg Oral Daily   metoprolol tartrate  12.5 mg Oral BID   potassium chloride  40 mEq Oral TID   sodium chloride  500 mL Intravenous Q24H   sodium chloride  500 mL Intravenous Q24H   Continuous Infusions:  sodium chloride 75  mL/hr at 02/11/19 0521   amphotericin  B  Liposome (AMBISOME) ADULT IV 250 mg (02/10/19 1820)    Bonnell Public, MD [facility] 02/12/2019, 2:45 PM

## 2019-02-12 NOTE — Progress Notes (Signed)
Patient ID: David Whitehead, male   DOB: 1970-07-28, 48 y.o.   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  He has had some clinical improvement but his opening pressures have been elevated with each of his 4 lumbar punctures.  I would recommend consulting neurosurgery about the possibility of placing a temporary lumbar drain.  If that is not done he will need to continue serial lumbar punctures every 1 to 2 days.  He is tolerating his antifungal therapy so far.  PLAN: 1. Continue amphotericin and 5-flucytosine 2. Recommend consult to neurosurgery for possible temporary lumbar drain (if not he will need a another lumbar puncture ordered tomorrow)  Principal Problem:   Cryptococcal meningitis ([place]) Active Problems:   CLL (chronic lymphocytic leukemia) (HCC)   Pulmonary nodule   Hypertension   Scheduled Meds: . acyclovir  400 mg Oral BID  . Chlorhexidine Gluconate Cloth  6 each Topical Daily  . dextrose  10 mL Intravenous Q24H  . dextrose  10 mL Intravenous Q24H  . flucytosine  25 mg/kg Oral Q6H  . loratadine  10 mg Oral Daily  . metoprolol tartrate  12.5 mg Oral BID  . potassium chloride  40 mEq Oral TID  . sodium chloride  500 mL Intravenous Q24H  . sodium chloride  500 mL Intravenous Q24H   Continuous Infusions: . sodium chloride 75 mL/hr at 02/11/19 0521  . amphotericin  B  Liposome (AMBISOME) ADULT IV 250 mg (02/10/19 1820)   PRN Meds:.acetaminophen, acetaminophen, diphenhydrAMINE **OR** diphenhydrAMINE, meperidine (DEMEROL) injection, sodium chloride flush   SUBJECTIVE: He is feeling better.  His double vision resolved after his first lumbar puncture.  His headache is much improved.  However, he notes that his headache gets better after each lumbar puncture and then slowly  worsens over the next 24 to 48 hours.  His headache was progressing slowly for about 5 weeks before this admission 1 week ago.  Review of Systems: Review of Systems  Constitutional: Negative for fever.  HENT: Negative for hearing loss.   Eyes: Negative for blurred vision and double vision.  Respiratory: Negative for cough.   Cardiovascular: Negative for chest pain.  Gastrointestinal: Negative for abdominal pain, diarrhea, nausea and vomiting.  Musculoskeletal: Negative for back pain and myalgias.  Neurological: Positive for headaches. Negative for speech change and focal weakness.    Allergies  Allergen Reactions  . Bactrim [Sulfamethoxazole-Trimethoprim] Rash  . Latex Other (See Comments)    Took skin off when removed tape  . Amoxicillin Rash    Has patient had a PCN reaction causing immediate rash, facial/tongue/throat swelling, SOB or lightheadedness with hypotension: No Has patient had a PCN reaction causing severe rash involving mucus membranes or skin necrosis: No Has patient had a PCN reaction that required hospitalization: No Has patient had a PCN reaction occurring within the last 10 years: No If all of the above answers are "NO", then may proceed with Cephalosporin use.     OBJECTIVE: Vitals:   02/11/19 1814 02/11/19 2042 02/12/19 0401 02/12/19 0939  BP: (!) 187/83 (!) 134/92 126/87 (!) 142/91  Pulse: 67 79 83 77  Resp: 18 18 18 18   Temp:  98.8 F (37.1 C) 99.5 F (37.5 C) 98.4  F (36.9 C)  TempSrc:  Oral Oral Oral  SpO2: 100% 96% 98% 100%  Weight:  95.2 kg 93.1 kg   Height:       Body mass index is 29.45 kg/m.  Physical Exam Constitutional:      Comments: Is very pleasant and in no distress.  He is resting quietly in bed.  His wife is at the bedside.  Eyes:     Conjunctiva/sclera: Conjunctivae normal.  Neck:     Musculoskeletal: Neck supple.  Cardiovascular:     Rate and Rhythm: Normal rate and regular rhythm.     Heart sounds: No murmur.  Pulmonary:      Effort: Pulmonary effort is normal.     Breath sounds: Normal breath sounds.  Abdominal:     Palpations: Abdomen is soft.     Tenderness: There is no abdominal tenderness.  Neurological:     General: No focal deficit present.     Mental Status: He is oriented to person, place, and time.  Psychiatric:        Mood and Affect: Mood normal.   Lumbar puncture 9/25 9/30 10/2 10/3 OP   55 28 28 29  CP   10   12 WBC   2 510 198 63 Protein   92 73 70 93 Glucose  21 32 24 24   Culture   + -- pend pend    Lab Results Lab Results  Component Value Date   WBC 12.5 (H) 02/12/2019   HGB 14.4 02/12/2019   HCT 43.9 02/12/2019   MCV 82.8 02/12/2019   PLT 201 02/12/2019    Lab Results  Component Value Date   CREATININE 0.84 02/12/2019   BUN 7 02/12/2019   NA 140 02/12/2019   K 3.9 02/12/2019   CL 110 02/12/2019   CO2 22 02/12/2019    Lab Results  Component Value Date   ALT 22 02/09/2019   AST 12 (L) 02/09/2019   ALKPHOS 30 (L) 02/09/2019   BILITOT 0.6 02/09/2019     Microbiology: Recent Results (from the past 240 hour(s))  CSF culture     Status: Abnormal   Collection Time: 02/04/19  9:30 AM   Specimen: Lumbar Puncture; Cerebrospinal Fluid  Result Value Ref Range Status   MICRO NUMBER: [id number]  Final   SPECIMEN QUALITY: Adequate  Final   Source CEREBROSPINAL FLUID (CSF)  Final   STATUS: FINAL  Final   GRAM STAIN: (A)  Final    Many White blood cells seen No organisms seen Gram stain prepared by cytospin   ISOLATE 1: Cryptococcus neoformans (AA)  Final    Comment: Scant growth of Cryptococcus neoformans   MYCOBACTERIA, CULTURE, WITH FLUOROCHROME SMEAR     Status: None (Preliminary result)   Collection Time: 02/04/19  9:30 AM  Result Value Ref Range Status   MICRO NUMBER: [id number]  Preliminary   SPECIMEN QUALITY: Adequate  Preliminary   Source: CEREBROSPINAL FLUID (CSF)  Preliminary   STATUS: PRELIMINARY  Preliminary   SMEAR: No acid fast bacilli seen.  Preliminary    RESULT:   Preliminary    Culture results to follow. Final reports of negative cultures can be expected in approximately six weeks. Positive cultures are reported immediately.  Culture, blood (Routine x 2)     Status: None   Collection Time: 02/07/19  1:50 PM   Specimen: BLOOD LEFT ARM  Result Value Ref Range Status   Specimen Description BLOOD LEFT ARM  Final   Special Requests  Final    BOTTLES DRAWN AEROBIC AND ANAEROBIC Blood Culture results may not be optimal due to an excessive volume of blood received in culture bottles   Culture   Final    NO GROWTH 5 DAYS Performed at [place] [address], [place], [place] [address]    Report Status 02/12/2019 FINAL  Final  Culture, blood (Routine x 2)     Status: None   Collection Time: 02/07/19  3:06 PM   Specimen: BLOOD RIGHT ARM  Result Value Ref Range Status   Specimen Description BLOOD RIGHT ARM  Final   Special Requests   Final    BOTTLES DRAWN AEROBIC AND ANAEROBIC Blood Culture results may not be optimal due to an excessive volume of blood received in culture bottles   Culture   Final    NO GROWTH 5 DAYS Performed at [facility], [place] [address], [place], [place] [address]    Report Status 02/12/2019 FINAL  Final  SARS CORONAVIRUS 2 (TAT 6-24 HRS) Nasopharyngeal Nasopharyngeal Swab     Status: None   Collection Time: 02/07/19  3:54 PM   Specimen: Nasopharyngeal Swab  Result Value Ref Range Status   SARS Coronavirus 2 NEGATIVE NEGATIVE Final    Comment: (NOTE) SARS-CoV-2 target nucleic acids are NOT DETECTED. The SARS-CoV-2 RNA is generally detectable in upper and lower respiratory specimens during the acute phase of infection. Negative results do not preclude SARS-CoV-2 infection, do not rule out co-infections with other pathogens, and should not be used as the sole basis for treatment or other patient management decisions. Negative results must be combined with clinical observations, patient history, and  epidemiological information. The expected result is Negative. Fact Sheet for Patients: SugarRoll.be Fact Sheet for Healthcare Providers: https://www.woods-mathews.com/ This test is not yet approved or cleared by the [place] FDA and  has been authorized for detection and/or diagnosis of SARS-CoV-2 by FDA under an Emergency Use Authorization (EUA). This EUA will remain  in effect (meaning this test can be used) for the duration of the COVID-19 declaration under Section 56 4(b)(1) of the Act, 21 U.S.C. section 360bbb-3(b)(1), unless the authorization is terminated or revoked sooner. Performed at [facility], [place] [address], [place], [place] [address]   CSF culture     Status: None (Preliminary result)   Collection Time: 02/09/19 11:36 AM   Specimen: PATH Cytology CSF; Cerebrospinal Fluid  Result Value Ref Range Status   Specimen Description CSF  Final   Special Requests NONE  Final   Gram Stain   Final    WBC PRESENT,BOTH PMN AND MONONUCLEAR NO ORGANISMS SEEN CYTOSPIN SMEAR    Culture   Final    NO GROWTH 3 DAYS Performed at [facility], [place] [address] [place],  [address]    Report Status PENDING  Incomplete  CSF culture with Stat gram stain     Status: None (Preliminary result)   Collection Time: 02/11/19 11:32 AM   Specimen: CSF; Cerebrospinal Fluid  Result Value Ref Range Status   Specimen Description CSF  Final   Special Requests Immunocompromised  Final   Gram Stain   Final    WBC PRESENT, PREDOMINANTLY MONONUCLEAR NO ORGANISMS SEEN CYTOSPIN SMEAR    Culture   Final    NO GROWTH < 24 HOURS Performed at [facility], [place] [address] [place],  [address]    Report Status PENDING  Incomplete  CSF culture  Status: None (Preliminary result)   Collection Time: 02/12/19 11:14 AM   Specimen: PATH Cytology CSF; Cerebrospinal Fluid  Result Value Ref Range Status   Specimen Description CSF   Final   Special Requests NONE  Final   Gram Stain   Final    WBC PRESENT, PREDOMINANTLY MONONUCLEAR NO ORGANISMS SEEN CYTOSPIN SMEAR Performed at [facility], [place] [address] [place], [place] [address]    Culture PENDING  Incomplete   Report Status PENDING  Incomplete    Michel Bickers, MD [facility] [phone] pager   [phone] cell 02/12/2019, 3:22 PM

## 2019-02-12 NOTE — Progress Notes (Signed)
CRITICAL VALUE STICKER  CRITICAL VALUE: WBC from Spinal fluid 63           CBG 24 from [place] (on-site recipient of call): Emilio Math, RN, BSN   DATE & TIME NOTIFIED:  02/12/19 1215  MESSENGER (representative from lab):  MD NOTIFIED: MD [place]   TIME OF NOTIFICATION: [id number]  RESPONSE:   CBG was retaken ( fingerstick)= 98.

## 2019-02-12 NOTE — Plan of Care (Signed)
  Problem: Education: Goal: Knowledge of General Education information will improve Description Including pain rating scale, medication(s)/side effects and non-pharmacologic comfort measures Outcome: Progressing   

## 2019-02-13 DIAGNOSIS — Z91040 Latex allergy status: Secondary | ICD-10-CM | POA: Diagnosis not present

## 2019-02-13 DIAGNOSIS — C9110 Chronic lymphocytic leukemia of B-cell type not having achieved remission: Secondary | ICD-10-CM | POA: Diagnosis not present

## 2019-02-13 DIAGNOSIS — B451 Cerebral cryptococcosis: Secondary | ICD-10-CM | POA: Diagnosis not present

## 2019-02-13 DIAGNOSIS — Z881 Allergy status to other antibiotic agents status: Secondary | ICD-10-CM | POA: Diagnosis not present

## 2019-02-13 LAB — CBC
HCT: 44.2 % (ref 39.0–52.0)
Hemoglobin: 14.6 g/dL (ref 13.0–17.0)
MCH: 27.5 pg (ref 26.0–34.0)
MCHC: 33 g/dL (ref 30.0–36.0)
MCV: 83.4 fL (ref 80.0–100.0)
Platelets: 185 10*3/uL (ref 150–400)
RBC: 5.3 MIL/uL (ref 4.22–5.81)
RDW: 13.7 % (ref 11.5–15.5)
WBC: 12.3 10*3/uL — ABNORMAL HIGH (ref 4.0–10.5)
nRBC: 0 % (ref 0.0–0.2)

## 2019-02-13 LAB — CSF CULTURE W GRAM STAIN: Culture: NO GROWTH

## 2019-02-13 LAB — BASIC METABOLIC PANEL
Anion gap: 8 (ref 5–15)
BUN: 5 mg/dL — ABNORMAL LOW (ref 6–20)
CO2: 22 mmol/L (ref 22–32)
Calcium: 9 mg/dL (ref 8.9–10.3)
Chloride: 109 mmol/L (ref 98–111)
Creatinine, Ser: 0.82 mg/dL (ref 0.61–1.24)
GFR calc Af Amer: >60 mL/min (ref >60)
GFR calc non Af Amer: >60 mL/min (ref >60)
Glucose, Bld: 109 mg/dL — ABNORMAL HIGH (ref 70–99)
Potassium: 3.8 mmol/L (ref 3.5–5.1)
Sodium: 139 mmol/L (ref 135–145)

## 2019-02-13 LAB — MAGNESIUM: Magnesium: 2.3 mg/dL (ref 1.7–2.4)

## 2019-02-13 NOTE — Progress Notes (Signed)
Patient ID: Barlow Ola, male   DOB: 1970-12-31, 48 y.o.   MRN: [id number] BP (!) 145/98 (BP Location: Right Arm)   Pulse 98   Temp 98.3 F (36.8 C) (Oral)   Resp 18   Ht 5\' 10"  (1.778 m)   Wt 93.1 kg   SpO2 96%   BMI 29.45 kg/m  Alert and oriented x 4, speech is clear Perrl, full eom No diplopia At this time Mr. Kiesewetter feels significantly better. No neurologic manifestations at this time. No indication to place csf diversion at this time.

## 2019-02-13 NOTE — Progress Notes (Signed)
Patient ID: David Whitehead, male   DOB: [birth year], 48 y.o.   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  His CSF culture from 02/09/2019 is negative.  2 more recent cultures are pending.  I discussed the situation with Dr. Ashok Pall of neurosurgery.  He does not feel that a lumbar drain is indicated at this time but did offer to do a bedside a lumbar puncture in the lateral decubitus position.  He is concerned that the prone position used by IR can falsely elevate opening pressure.  PLAN: 1. Continue amphotericin and 5-flucytosine 2. Await repeat lumbar puncture by Dr. Christella Noa  Principal Problem:   Cryptococcal meningitis ([place]) Active Problems:   CLL (chronic lymphocytic leukemia) ([place])   Pulmonary nodule   Hypertension   Scheduled Meds: . acyclovir  400 mg Oral BID  . Chlorhexidine Gluconate Cloth  6 each Topical Daily  . dextrose  10 mL Intravenous Q24H  . dextrose  10 mL Intravenous Q24H  . flucytosine  25 mg/kg Oral Q6H  . loratadine  10 mg Oral Daily  . metoprolol tartrate  12.5 mg Oral BID  . potassium chloride  40 mEq Oral TID  . sodium chloride  500 mL Intravenous Q24H  . sodium chloride  500 mL Intravenous Q24H   Continuous Infusions: . sodium chloride 75 mL/hr at 02/13/19 1249  . amphotericin  B  Liposome (AMBISOME) ADULT IV 250 mg (02/12/19 1808)   PRN Meds:.acetaminophen, acetaminophen, diphenhydrAMINE **OR** diphenhydrAMINE, meperidine (DEMEROL) injection, sodium chloride flush   SUBJECTIVE: David Whitehead is feeling much better.  He is only having minimal headache.  He has had no more double vision.  Review of Systems: Review of Systems  Constitutional: Negative for fever.  HENT: Negative for hearing loss.   Eyes: Negative for blurred vision and double vision.  Respiratory:  Negative for cough.   Cardiovascular: Negative for chest pain.  Gastrointestinal: Negative for abdominal pain, diarrhea, nausea and vomiting.  Musculoskeletal: Negative for back pain and myalgias.  Neurological: Positive for headaches. Negative for speech change and focal weakness.    Allergies  Allergen Reactions  . Bactrim [Sulfamethoxazole-Trimethoprim] Rash  . Latex Other (See Comments)    Took skin off when removed tape  . Amoxicillin Rash    Has patient had a PCN reaction causing immediate rash, facial/tongue/throat swelling, SOB or lightheadedness with hypotension: No Has patient had a PCN reaction causing severe rash involving mucus membranes or skin necrosis: No Has patient had a PCN reaction that required hospitalization: No Has patient had a PCN reaction occurring within the last 10 years: No If all of the above answers are "NO", then may proceed with Cephalosporin use.     OBJECTIVE: Vitals:   02/12/19 1622 02/12/19 2100 02/13/19 0536 02/13/19 0935  BP: (!) 139/94 139/78 126/86 (!) 145/98  Pulse: 84 80 81 98  Resp: 18 18 18    Temp: 98.9 F (37.2 C) 98.2 F (36.8 C) 98.3 F (36.8 C)   TempSrc: Oral Oral Oral   SpO2: 98% 99% 94% 96%  Weight:  93.1 kg    Height:       Body mass index is 29.45 kg/m.  Physical Exam Constitutional:      Comments: He is  comfortable and in no distress.  His wife is visiting.  Eyes:     Conjunctiva/sclera: Conjunctivae normal.  Neck:     Musculoskeletal: Neck supple.  Cardiovascular:     Rate and Rhythm: Normal rate and regular rhythm.     Heart sounds: No murmur.  Pulmonary:     Effort: Pulmonary effort is normal.     Breath sounds: Normal breath sounds.  Abdominal:     Palpations: Abdomen is soft.     Tenderness: There is no abdominal tenderness.  Neurological:     General: No focal deficit present.     Mental Status: He is oriented to person, place, and time.  Psychiatric:        Mood and Affect: Mood normal.    Lumbar puncture 9/25 9/30 10/2 10/3 OP   55 28 28 29  CP   10   12 WBC   2 510 198 63 Protein   92 73 70 93 Glucose  21 32 24 24   Culture   + -- pend pend    Lab Results Lab Results  Component Value Date   WBC 12.3 (H) 02/13/2019   HGB 14.6 02/13/2019   HCT 44.2 02/13/2019   MCV 83.4 02/13/2019   PLT 185 02/13/2019    Lab Results  Component Value Date   CREATININE 0.82 02/13/2019   BUN 5 (L) 02/13/2019   NA 139 02/13/2019   K 3.8 02/13/2019   CL 109 02/13/2019   CO2 22 02/13/2019    Lab Results  Component Value Date   ALT 22 02/09/2019   AST 12 (L) 02/09/2019   ALKPHOS 30 (L) 02/09/2019   BILITOT 0.6 02/09/2019     Microbiology: Recent Results (from the past 240 hour(s))  CSF culture     Status: Abnormal   Collection Time: 02/04/19  9:30 AM   Specimen: Lumbar Puncture; Cerebrospinal Fluid  Result Value Ref Range Status   MICRO NUMBER: [id number]  Final   SPECIMEN QUALITY: Adequate  Final   Source CEREBROSPINAL FLUID (CSF)  Final   STATUS: FINAL  Final   GRAM STAIN: (A)  Final    Many White blood cells seen No organisms seen Gram stain prepared by cytospin   ISOLATE 1: Cryptococcus neoformans (AA)  Final    Comment: Scant growth of Cryptococcus neoformans   MYCOBACTERIA, CULTURE, WITH FLUOROCHROME SMEAR     Status: None (Preliminary result)   Collection Time: 02/04/19  9:30 AM  Result Value Ref Range Status   MICRO NUMBER: [id number]  Preliminary   SPECIMEN QUALITY: Adequate  Preliminary   Source: CEREBROSPINAL FLUID (CSF)  Preliminary   STATUS: PRELIMINARY  Preliminary   SMEAR: No acid fast bacilli seen.  Preliminary   RESULT:   Preliminary    Culture results to follow. Final reports of negative cultures can be expected in approximately six weeks. Positive cultures are reported immediately.  Culture, blood (Routine x 2)     Status: None   Collection Time: 02/07/19  1:50 PM   Specimen: BLOOD LEFT ARM  Result Value Ref Range Status   Specimen Description  BLOOD LEFT ARM  Final   Special Requests   Final    BOTTLES DRAWN AEROBIC AND ANAEROBIC Blood Culture results may not be optimal due to an excessive volume of blood received in culture bottles   Culture   Final    NO GROWTH 5 DAYS Performed at [facility], [place] [address], [place], [place] [address]  Report Status 02/12/2019 FINAL  Final  Culture, blood (Routine x 2)     Status: None   Collection Time: 02/07/19  3:06 PM   Specimen: BLOOD RIGHT ARM  Result Value Ref Range Status   Specimen Description BLOOD RIGHT ARM  Final   Special Requests   Final    BOTTLES DRAWN AEROBIC AND ANAEROBIC Blood Culture results may not be optimal due to an excessive volume of blood received in culture bottles   Culture   Final    NO GROWTH 5 DAYS Performed at [facility], [place] [address], [place], [place] [address]    Report Status 02/12/2019 FINAL  Final  SARS CORONAVIRUS 2 (TAT 6-24 HRS) Nasopharyngeal Nasopharyngeal Swab     Status: None   Collection Time: 02/07/19  3:54 PM   Specimen: Nasopharyngeal Swab  Result Value Ref Range Status   SARS Coronavirus 2 NEGATIVE NEGATIVE Final    Comment: (NOTE) SARS-CoV-2 target nucleic acids are NOT DETECTED. The SARS-CoV-2 RNA is generally detectable in upper and lower respiratory specimens during the acute phase of infection. Negative results do not preclude SARS-CoV-2 infection, do not rule out co-infections with other pathogens, and should not be used as the sole basis for treatment or other patient management decisions. Negative results must be combined with clinical observations, patient history, and epidemiological information. The expected result is Negative. Fact Sheet for Patients: SugarRoll.be Fact Sheet for Healthcare Providers: https://www.woods-mathews.com/ This test is not yet approved or cleared by the [place] FDA and  has been authorized for detection and/or diagnosis of  SARS-CoV-2 by FDA under an Emergency Use Authorization (EUA). This EUA will remain  in effect (meaning this test can be used) for the duration of the COVID-19 declaration under Section 56 4(b)(1) of the Act, 21 U.S.C. section 360bbb-3(b)(1), unless the authorization is terminated or revoked sooner. Performed at [facility], [place] [address] [place], [place] [address]   CSF culture     Status: None   Collection Time: 02/09/19 11:36 AM   Specimen: PATH Cytology CSF; Cerebrospinal Fluid  Result Value Ref Range Status   Specimen Description CSF  Final   Special Requests NONE  Final   Gram Stain   Final    WBC PRESENT,BOTH PMN AND MONONUCLEAR NO ORGANISMS SEEN CYTOSPIN SMEAR    Culture   Final    NO GROWTH 3 DAYS Performed at [facility], [place] [address] [place], [place] [address]    Report Status 02/13/2019 FINAL  Final  CSF culture with Stat gram stain     Status: None (Preliminary result)   Collection Time: 02/11/19 11:32 AM   Specimen: CSF; Cerebrospinal Fluid  Result Value Ref Range Status   Specimen Description CSF  Final   Special Requests Immunocompromised  Final   Gram Stain   Final    WBC PRESENT, PREDOMINANTLY MONONUCLEAR NO ORGANISMS SEEN CYTOSPIN SMEAR    Culture   Final    NO GROWTH 2 DAYS Performed at [facility], [place] [address], [place], [place] [address]    Report Status PENDING  Incomplete  CSF culture     Status: None (Preliminary result)   Collection Time: 02/12/19 11:14 AM   Specimen: PATH Cytology CSF; Cerebrospinal Fluid  Result Value Ref Range Status   Specimen Description CSF  Final   Special Requests NONE  Final   Gram Stain   Final    WBC PRESENT, PREDOMINANTLY MONONUCLEAR NO ORGANISMS SEEN CYTOSPIN  SMEAR    Culture   Final    NO GROWTH < 24 HOURS Performed at [facility], [place] [address], [place], [place] [address]    Report Status PENDING  Incomplete    Michel Bickers, MD [facility] [phone] pager   [phone] cell 02/13/2019, 3:12 PM

## 2019-02-13 NOTE — Progress Notes (Signed)
Patient ID: David Whitehead, male   DOB: [birth year], 48 y.o.   MRN: [id number]  PROGRESS NOTE    David Whitehead  [id number] DOB: 08/06/1970 DOA: 02/07/2019 PCP: [name], MD   Brief Narrative:  48 year old male with history of CLL, hypertension, diabetes mellitus type 2 presented with subacute headache and diplopia.  He was recently evaluated by neurology and subsequently had LP done which showed opening pressure of 55.  His cryptococcal antigen titers came back positive at 1:64.  Patient was admitted with cryptococcal meningitis.  Infectious disease team is directing care.  Patient is currently on IV amphotericin B and flucytosine, and has undergone 3 lumbar punctures, initial lumbar puncture being for diagnostic purposes and for opening pressures.  To last opening pressures have been 28 cm water.  Infectious disease team recommended consulting neurosurgery for temporary lumbar drain.  Neurosurgeon, Dr. Ashok Pall, was consulted.  Dr. Christella Noa is of the opinion then opening pressure done by the interventional radiology team in prone position may be falsely elevated.  No lumbar drain is planned for now as per neurosurgery team.  At the best, neurosurgery team will assist with measuring opening pressure with lumbar puncture and lateral decubitus position.  Patient has remained stable.  Headache is resolved, visual symptoms are resolved, no fever or chills.     Assessment & Plan: Cryptococcal meningitis: -Patient had a recent outpatient LP which had shown opening pressure of 55 and his cryptococcal antigen titers came back positive at 1:64 -Started on amphotericin B and flucytosine by ID.  -ID recommends daily LPs until normal opening pressures x2 -IR has been measuring lumbar pressure, but this may be falsely elevated.  Kindly see above. -Continue monitoring electrolytes and renal function while using amphotericin B.  CLL: -Dr. Maylon Peppers, oncology was contacted on 02/08/2019.  -As per Dr.  Maylon Peppers, it is okay for ibrutinib to be on hold for now. -Continue acyclovir. -Still awaiting formal input from Dr. Maylon Peppers, oncologist  Hypertension: -Continue metoprolol  Pulmonary nodule: -Possibly source of his cryptococcus as per ID.  DVT prophylaxis: We will hold Lovenox.  SCDs Code Status: Full Family Communication: None at bedside Disposition Plan: We will probably need to be inpatient for at least 2 weeks for IV amphotericin.  Consultants: ID and neurosurgery.  Awaiting input from oncology.  Procedures: None  Antimicrobials: Amphotericin B and flucytosine from 02/07/2019 onwards Outpatient acyclovir continued.  Subjective: No nausea or vomiting. Headache has improved significantly. No fever or chills. No shortness of breath  Objective: Vitals:   02/12/19 1622 02/12/19 2100 02/13/19 0536 02/13/19 0935  BP: (!) 139/94 139/78 126/86 (!) 145/98  Pulse: 84 80 81 98  Resp: 18 18 18    Temp: 98.9 F (37.2 C) 98.2 F (36.8 C) 98.3 F (36.8 C)   TempSrc: Oral Oral Oral   SpO2: 98% 99% 94% 96%  Weight:  93.1 kg    Height:        Intake/Output Summary (Last 24 hours) at 02/13/2019 1031 Last data filed at 02/13/2019 0939 Gross per 24 hour  Intake 3647.29 ml  Output 0 ml  Net 3647.29 ml   Filed Weights   02/11/19 2042 02/12/19 0401 02/12/19 2100  Weight: 95.2 kg 93.1 kg 93.1 kg    Examination:  General exam: Appears calm and comfortable  Respiratory system: Bilateral decreased breath sounds at bases Cardiovascular system: S1 & S2 heard, Rate controlled Gastrointestinal system: Abdomen is nondistended, soft and nontender. Normal bowel sounds heard. Extremities: No cyanosis, clubbing, edema  Data Reviewed: I have personally reviewed following labs and imaging studies  CBC: Recent Labs  Lab 02/07/19 1520 02/08/19 0604 02/09/19 0446 02/11/19 0422 02/12/19 0336 02/13/19 0415  WBC 13.2* 12.3* 11.4* 11.3* 12.5* 12.3*  NEUTROABS 4.5  --  6.5  --   --   --     HGB 14.9 14.5 13.9 13.6 14.4 14.6  HCT 45.9 43.9 40.7 40.9 43.9 44.2  MCV 83.6 82.2 82.2 82.5 82.8 83.4  PLT 255 240 214 204 201 [id number]   Basic Metabolic Panel: Recent Labs  Lab 02/09/19 0446 02/10/19 0414 02/11/19 0422 02/12/19 0336 02/13/19 0415  NA 137 140 139 140 139  K 3.1* 3.3* 3.4* 3.9 3.8  CL 106 111 109 110 109  CO2 21* 22 23 22 22   GLUCOSE 115* 111* 108* 105* 109*  BUN 6 6 6 7  5*  CREATININE 0.85 0.80 0.99 0.84 0.82  CALCIUM 8.6* 8.8* 8.9 9.1 9.0  MG 2.0 2.2 2.2 2.3 2.3  PHOS  --   --  3.9 4.5  --    GFR: Estimated Creatinine Clearance: 130.3 mL/min (by C-G formula based on SCr of 0.82 mg/dL). Liver Function Tests: Recent Labs  Lab 02/07/19 1520 02/08/19 0604 02/09/19 0446 02/11/19 0422 02/12/19 0336  AST 14* 14* 12*  --   --   ALT 27 23 22   --   --   ALKPHOS 30* 28* 30*  --   --   BILITOT 0.4 0.9 0.6  --   --   PROT 6.1* 5.6* 5.5*  --   --   ALBUMIN 3.9 3.5 3.3* 3.1* 3.4*   No results for input(s): LIPASE, AMYLASE in the last 168 hours. No results for input(s): AMMONIA in the last 168 hours. Coagulation Profile: Recent Labs  Lab 02/07/19 1520  INR 1.0   Cardiac Enzymes: No results for input(s): CKTOTAL, CKMB, CKMBINDEX, TROPONINI in the last 168 hours. BNP (last 3 results) No results for input(s): PROBNP in the last 8760 hours. HbA1C: No results for input(s): HGBA1C in the last 72 hours. CBG: Recent Labs  Lab 02/12/19 1221  GLUCAP 98   Lipid Profile: No results for input(s): CHOL, HDL, LDLCALC, TRIG, CHOLHDL, LDLDIRECT in the last 72 hours. Thyroid Function Tests: No results for input(s): TSH, T4TOTAL, FREET4, T3FREE, THYROIDAB in the last 72 hours. Anemia Panel: No results for input(s): VITAMINB12, FOLATE, FERRITIN, TIBC, IRON, RETICCTPCT in the last 72 hours. Sepsis Labs: Recent Labs  Lab 02/07/19 1520  LATICACIDVEN 1.3    Recent Results (from the past 240 hour(s))  CSF culture     Status: Abnormal   Collection Time: 02/04/19   9:30 AM   Specimen: Lumbar Puncture; Cerebrospinal Fluid  Result Value Ref Range Status   MICRO NUMBER: [id number]  Final   SPECIMEN QUALITY: Adequate  Final   Source CEREBROSPINAL FLUID (CSF)  Final   STATUS: FINAL  Final   GRAM STAIN: (A)  Final    Many White blood cells seen No organisms seen Gram stain prepared by cytospin   ISOLATE 1: Cryptococcus neoformans (AA)  Final    Comment: Scant growth of Cryptococcus neoformans   MYCOBACTERIA, CULTURE, WITH FLUOROCHROME SMEAR     Status: None (Preliminary result)   Collection Time: 02/04/19  9:30 AM  Result Value Ref Range Status   MICRO NUMBER: [id number]  Preliminary   SPECIMEN QUALITY: Adequate  Preliminary   Source: CEREBROSPINAL FLUID (CSF)  Preliminary   STATUS: PRELIMINARY  Preliminary   SMEAR: No acid  fast bacilli seen.  Preliminary   RESULT:   Preliminary    Culture results to follow. Final reports of negative cultures can be expected in approximately six weeks. Positive cultures are reported immediately.  Culture, blood (Routine x 2)     Status: None   Collection Time: 02/07/19  1:50 PM   Specimen: BLOOD LEFT ARM  Result Value Ref Range Status   Specimen Description BLOOD LEFT ARM  Final   Special Requests   Final    BOTTLES DRAWN AEROBIC AND ANAEROBIC Blood Culture results may not be optimal due to an excessive volume of blood received in culture bottles   Culture   Final    NO GROWTH 5 DAYS Performed at [facility], [place] [address] [place], [place] [address]    Report Status 02/12/2019 FINAL  Final  Culture, blood (Routine x 2)     Status: None   Collection Time: 02/07/19  3:06 PM   Specimen: BLOOD RIGHT ARM  Result Value Ref Range Status   Specimen Description BLOOD RIGHT ARM  Final   Special Requests   Final    BOTTLES DRAWN AEROBIC AND ANAEROBIC Blood Culture results may not be optimal due to an excessive volume of blood received in culture bottles   Culture   Final    NO GROWTH 5 DAYS Performed at [facility], [place] [address], [place], [place] [address]    Report Status 02/12/2019 FINAL  Final  SARS CORONAVIRUS 2 (TAT 6-24 HRS) Nasopharyngeal Nasopharyngeal Swab     Status: None   Collection Time: 02/07/19  3:54 PM   Specimen: Nasopharyngeal Swab  Result Value Ref Range Status   SARS Coronavirus 2 NEGATIVE NEGATIVE Final    Comment: (NOTE) SARS-CoV-2 target nucleic acids are NOT DETECTED. The SARS-CoV-2 RNA is generally detectable in upper and lower respiratory specimens during the acute phase of infection. Negative results do not preclude SARS-CoV-2 infection, do not rule out co-infections with other pathogens, and should not be used as the sole basis for treatment or other patient management decisions. Negative results must be combined with clinical observations, patient history, and epidemiological information. The expected result is Negative. Fact Sheet for Patients: SugarRoll.be Fact Sheet for Healthcare Providers: https://www.woods-mathews.com/ This test is not yet approved or cleared by the [place] FDA and  has been authorized for detection and/or diagnosis of SARS-CoV-2 by FDA under an Emergency Use Authorization (EUA). This EUA will remain  in effect (meaning this test can be used) for the duration of the COVID-19 declaration under Section 56 4(b)(1) of the Act, 21 U.S.C. section 360bbb-3(b)(1), unless the authorization is terminated or revoked sooner. Performed at [facility], [place] [address], [place], [place] [address]   CSF culture     Status: None   Collection Time: 02/09/19 11:36 AM   Specimen: PATH Cytology CSF; Cerebrospinal Fluid  Result Value Ref Range Status   Specimen Description CSF  Final   Special Requests NONE  Final   Gram Stain   Final    WBC PRESENT,BOTH PMN AND MONONUCLEAR NO ORGANISMS SEEN CYTOSPIN SMEAR    Culture   Final    NO GROWTH 3 DAYS Performed at [facility], [place]  [address], [place], [place] [address]    Report Status 02/13/2019 FINAL  Final  CSF culture with Stat gram stain     Status: None (Preliminary result)   Collection Time: 02/11/19 11:32 AM   Specimen: CSF; Cerebrospinal  Fluid  Result Value Ref Range Status   Specimen Description CSF  Final   Special Requests Immunocompromised  Final   Gram Stain   Final    WBC PRESENT, PREDOMINANTLY MONONUCLEAR NO ORGANISMS SEEN CYTOSPIN SMEAR    Culture   Final    NO GROWTH 2 DAYS Performed at [facility], [address] [place], [place] [address]    Report Status PENDING  Incomplete  CSF culture     Status: None (Preliminary result)   Collection Time: 02/12/19 11:14 AM   Specimen: PATH Cytology CSF; Cerebrospinal Fluid  Result Value Ref Range Status   Specimen Description CSF  Final   Special Requests NONE  Final   Gram Stain   Final    WBC PRESENT, PREDOMINANTLY MONONUCLEAR NO ORGANISMS SEEN CYTOSPIN SMEAR    Culture   Final    NO GROWTH < 24 HOURS Performed at [facility], [place] [address] [place], [place] [address]    Report Status PENDING  Incomplete         Radiology Studies: Dg Fl Guided Lumbar Puncture  Result Date: 02/12/2019 CLINICAL DATA:  Cryptococcal meningitis. EXAM: DIAGNOSTIC LUMBAR PUNCTURE UNDER FLUOROSCOPIC GUIDANCE FLUOROSCOPY TIME:  Fluoroscopy Time:  0.1 minute Radiation Exposure Index (if provided by the fluoroscopic device): 0.4 mGy Number of Acquired Spot Images: 0 PROCEDURE: Informed consent was obtained from the patient prior to the procedure, including potential complications of headache, allergy, and pain. With the patient prone, the lower back was prepped with Betadine. 1% Lidocaine was used for local anesthesia. Lumbar puncture was performed at the L4-5 level using a 22 gauge needle with return of yellow CSF with an opening pressure of 29 cm water. 13 ml of CSF were obtained for laboratory studies. Closing pressure was 12 cm of water. The patient  tolerated the procedure well and there were no apparent complications. IMPRESSION: Successful fluoroscopic guided lumbar puncture. Electronically Signed   By: Kathreen Devoid   On: 02/12/2019 11:58   Dg Fl Guided Lumbar Puncture  Result Date: 02/11/2019 CLINICAL DATA:  Cryptococcal meningitis EXAM: DIAGNOSTIC LUMBAR PUNCTURE UNDER FLUOROSCOPIC GUIDANCE FLUOROSCOPY TIME:  Fluoroscopy Time:  1 minutes Radiation Exposure Index (if provided by the fluoroscopic device): 6.2 mGy Number of Acquired Spot Images: 0 PROCEDURE: Informed consent was obtained from the patient prior to the procedure, including potential complications of headache, allergy, and pain. With the patient prone, the lower back was prepped with Betadine. 1% Lidocaine was used for local anesthesia. Lumbar puncture was performed at the L3-4 level using a 22 gauge needle with return of clear CSF with an opening pressure of 28 cm water. Twenty-one ml of CSF were obtained for laboratory studies. The patient tolerated the procedure well and there were no apparent complications. IMPRESSION: 1. Lumbar puncture with removal of 21 cc of clear CSF. Opening pressure was equal to 28 cm of water. Electronically Signed   By: Kerby Moors M.D.   On: 02/11/2019 11:47        Scheduled Meds:  acyclovir  400 mg Oral BID   Chlorhexidine Gluconate Cloth  6 each Topical Daily   dextrose  10 mL Intravenous Q24H   dextrose  10 mL Intravenous Q24H   flucytosine  25 mg/kg Oral Q6H   loratadine  10 mg Oral Daily   metoprolol tartrate  12.5 mg Oral BID   potassium chloride  40 mEq Oral TID   sodium chloride  500 mL Intravenous Q24H   sodium  chloride  500 mL Intravenous Q24H   Continuous Infusions:  sodium chloride 75 mL/hr at 02/12/19 2210   amphotericin  B  Liposome (AMBISOME) ADULT IV 250 mg (02/12/19 1808)    Bonnell Public, MD [facility] 02/13/2019, 10:31 AM

## 2019-02-14 DIAGNOSIS — G039 Meningitis, unspecified: Secondary | ICD-10-CM

## 2019-02-14 LAB — CSF CULTURE W GRAM STAIN: Culture: NO GROWTH

## 2019-02-14 LAB — CBC
HCT: 43.9 % (ref 39.0–52.0)
Hemoglobin: 14.6 g/dL (ref 13.0–17.0)
MCH: 27.3 pg (ref 26.0–34.0)
MCHC: 33.3 g/dL (ref 30.0–36.0)
MCV: 82.2 fL (ref 80.0–100.0)
Platelets: 177 10*3/uL (ref 150–400)
RBC: 5.34 MIL/uL (ref 4.22–5.81)
RDW: 13.8 % (ref 11.5–15.5)
WBC: 11.6 10*3/uL — ABNORMAL HIGH (ref 4.0–10.5)
nRBC: 0 % (ref 0.0–0.2)

## 2019-02-14 LAB — BASIC METABOLIC PANEL
Anion gap: 8 (ref 5–15)
BUN: 8 mg/dL (ref 6–20)
CO2: 22 mmol/L (ref 22–32)
Calcium: 9 mg/dL (ref 8.9–10.3)
Chloride: 111 mmol/L (ref 98–111)
Creatinine, Ser: 1.06 mg/dL (ref 0.61–1.24)
GFR calc Af Amer: >60 mL/min (ref >60)
GFR calc non Af Amer: >60 mL/min (ref >60)
Glucose, Bld: 108 mg/dL — ABNORMAL HIGH (ref 70–99)
Potassium: 4.1 mmol/L (ref 3.5–5.1)
Sodium: 141 mmol/L (ref 135–145)

## 2019-02-14 LAB — PROTEIN AND GLUCOSE, CSF
Glucose, CSF: 39 mg/dL — ABNORMAL LOW (ref 40–70)
Total  Protein, CSF: 82 mg/dL — ABNORMAL HIGH (ref 15–45)

## 2019-02-14 LAB — CRYPTOCOCCAL ANTIGEN, CSF
Crypto Ag: POSITIVE — AB
Cryptococcal Ag Titer: <5 — AB

## 2019-02-14 LAB — MAGNESIUM: Magnesium: 2.1 mg/dL (ref 1.7–2.4)

## 2019-02-14 LAB — PATHOLOGIST SMEAR REVIEW

## 2019-02-14 MED ORDER — LIDOCAINE HCL (PF) 1 % IJ SOLN
5.0000 mL | Freq: Once | INTRAMUSCULAR | Status: AC
  Administered 2019-02-14: 5 mL
  Filled 2019-02-14: qty 5

## 2019-02-14 MED ORDER — LORAZEPAM 2 MG/ML IJ SOLN
1.0000 mg | Freq: Once | INTRAMUSCULAR | Status: AC
  Administered 2019-02-14: 1 mg via INTRAVENOUS
  Filled 2019-02-14: qty 1

## 2019-02-14 MED ORDER — MORPHINE SULFATE (PF) 2 MG/ML IV SOLN
2.0000 mg | Freq: Once | INTRAVENOUS | Status: AC
  Administered 2019-02-14: 2 mg via INTRAVENOUS
  Filled 2019-02-14: qty 1

## 2019-02-14 NOTE — Op Note (Signed)
Lumbar puncture  7:02 PM  PATIENT:  David Whitehead  48 y.o. male,  Cryptococcal meningitis  PRE-OPERATIVE DIAGNOSIS:  Elevated ICP  POST-OPERATIVE DIAGNOSIS:  Elevated ICP  PROCEDURE:  Lumbar puncture  SURGEON: Rody Keadle, K  ANESTHESIA:   local    SPECIMEN:  Source of Specimen:  lumbar puncture, csf  DICTATION: Marcos Eke   was positioned in a right lateral decubitus position. He was given 1mg  ativan, and 2mg  of morphine.  I prepped and draped his lumbar region in a sterile manner. I performed a lumbar puncture without difficulty.  Opening pressure 33cm h20 Closing pressure 18cm h2o 14cc csf removed.   PLAN OF CARE: remain in room  PATIENT DISPOSITION:  34m14  [name] is a 48 y.o. male Whom tolerated the lumbar puncture well Labs sent per ID Delay start of Pharmacological VTE agent (>24hrs) due to surgical blood loss or risk of bleeding:  yes

## 2019-02-14 NOTE — Plan of Care (Signed)
  Problem: Health Behavior/Discharge Planning: °Goal: Ability to manage health-related needs will improve °Outcome: Progressing °  °Problem: Clinical Measurements: °Goal: Will remain free from infection °Outcome: Progressing °  °Problem: Clinical Measurements: °Goal: Ability to maintain clinical measurements within normal limits will improve °Outcome: Progressing °  °

## 2019-02-14 NOTE — Progress Notes (Addendum)
[facility] Infectious Disease  Date of Admission:  02/07/2019      Total days of antibiotics 7  Day 7 flucytosine and L-ampho            ASSESSMENT: David Whitehead is a 48 y.o. 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 His chemo-agent is currently on hold.   Continue flucytosine + L-ampho with pre-/post- hydration. He seems to be at a stable position for electrolyte replacement; could consider sending him home with 3x weekly lab assessments once there is confidence that his ICP is under good control.   According to IDSA guidelines managing invasive cryptococcal disease, he will need longer induction therapy with > 4 weeks of IV therapy before transitioning to oral fluconazole. It is helpful to time this once we get a handle on his intracranial pressures. While his headaches are better he has since his last LP on Saturday, 48h ago, had a resurgence of tinnitus to the left ear and all LPs have had an elevated opening pressure thus far.  [email or web address]  Dr. Christella Noa will be able to come by this evening to do a bedside LP - we appreciate his help.  Would send CSF for cryptococcal antigen titer, protein/glucose, cell count. Opening pressure documented and with reduction to < 20 (or if significantly elevated 50% reduction). If normal fluid for the above testing will be helpful.    PLAN: 1. LP to be done this evening by Dr. Christella Noa 2. Continue antifungals and pre/post hydration  3. Electrolyte replacement / monitoring per pharmacy protocol    Principal Problem:   Cryptococcal meningitis ([place]) Active Problems:   CLL (chronic lymphocytic leukemia) (HCC)   Pulmonary nodule   Hypertension   . acyclovir  400 mg Oral BID  . Chlorhexidine Gluconate Cloth  6 each Topical Daily  . dextrose  10 mL Intravenous Q24H  . dextrose  10 mL Intravenous Q24H  . flucytosine  25  mg/kg Oral Q6H  . loratadine  10 mg Oral Daily  . metoprolol tartrate  12.5 mg Oral BID  . potassium chloride  40 mEq Oral TID  . sodium chloride  500 mL Intravenous Q24H  . sodium chloride  500 mL Intravenous Q24H    SUBJECTIVE: Normal vision today, no headaches so far but has since Sunday evening/early Monday had a resurgence of ringing in his left ear. No other signs of advancing neurologic issues. He is able to walk up and down to the bathroom easily. No side effects to his medications.    Interval additions - Mg 2.1, K 3.4, Cr. 1.06 (has been 0.8 - 1.06)   Review of Systems: Review of Systems  Constitutional: Negative for chills and fever.  HENT: Negative for hearing loss and tinnitus.   Eyes: Negative for blurred vision, double vision, photophobia and pain.  Cardiovascular: Negative for chest pain and leg swelling.  Musculoskeletal: Negative for neck pain.  Neurological: Positive for headaches (frontal and b/l temporal region). Negative for dizziness (once described above) and weakness.    Allergies  Allergen Reactions  . Bactrim [Sulfamethoxazole-Trimethoprim] Rash  . Latex Other (See Comments)    Took skin off when removed tape  . Amoxicillin Rash    Has patient had a PCN reaction causing immediate rash, facial/tongue/throat swelling, SOB or lightheadedness with hypotension: No Has patient had a PCN reaction causing severe rash involving mucus membranes or skin necrosis: No Has patient  had a PCN reaction that required hospitalization: No Has patient had a PCN reaction occurring within the last 10 years: No If all of the above answers are "NO", then may proceed with Cephalosporin use.     OBJECTIVE: Vitals:   02/13/19 1849 02/13/19 2100 02/14/19 0554 02/14/19 0902  BP: (!) 149/93 (!) 146/89 126/85 130/90  Pulse: 92 83 87 90  Resp: 18 18 18 18   Temp: (!) 97.5 F (36.4 C) 98.5 F (36.9 C) 98.4 F (36.9 C) 98 F (36.7 C)  TempSrc: Oral Oral Oral Oral  SpO2: 97%  100% 98% 95%  Weight:      Height:       Body mass index is 29.45 kg/m.  Physical Exam Constitutional:      Comments: Resting comfortably in bed watching TV  HENT:     Mouth/Throat:     Mouth: Mucous membranes are moist.     Pharynx: Oropharynx is clear.  Neck:     Musculoskeletal: No neck rigidity or muscular tenderness.  Cardiovascular:     Rate and Rhythm: Normal rate and regular rhythm.  Pulmonary:     Effort: Pulmonary effort is normal.     Breath sounds: Normal breath sounds.  Abdominal:     General: There is no distension.     Tenderness: There is no abdominal tenderness.  Musculoskeletal:     Comments: LUE PICC In place with clean/dry dressing  Skin:    General: Skin is warm and dry.     Capillary Refill: Capillary refill takes less than 2 seconds.  Neurological:     Mental Status: He is alert and oriented to person, place, and time.     Lab Results Lab Results  Component Value Date   WBC 11.6 (H) 02/14/2019   HGB 14.6 02/14/2019   HCT 43.9 02/14/2019   MCV 82.2 02/14/2019   PLT 177 02/14/2019    Lab Results  Component Value Date   CREATININE 1.06 02/14/2019   BUN 8 02/14/2019   NA 141 02/14/2019   K 4.1 02/14/2019   CL 111 02/14/2019   CO2 22 02/14/2019    Lab Results  Component Value Date   ALT 22 02/09/2019   AST 12 (L) 02/09/2019   ALKPHOS 30 (L) 02/09/2019   BILITOT 0.6 02/09/2019     Microbiology: Recent Results (from the past 240 hour(s))  Culture, blood (Routine x 2)     Status: None   Collection Time: 02/07/19  1:50 PM   Specimen: BLOOD LEFT ARM  Result Value Ref Range Status   Specimen Description BLOOD LEFT ARM  Final   Special Requests   Final    BOTTLES DRAWN AEROBIC AND ANAEROBIC Blood Culture results may not be optimal due to an excessive volume of blood received in culture bottles   Culture   Final    NO GROWTH 5 DAYS Performed at [facility], [place] [address], [place], [place] [address]    Report Status 02/12/2019  FINAL  Final  Culture, blood (Routine x 2)     Status: None   Collection Time: 02/07/19  3:06 PM   Specimen: BLOOD RIGHT ARM  Result Value Ref Range Status   Specimen Description BLOOD RIGHT ARM  Final   Special Requests   Final    BOTTLES DRAWN AEROBIC AND ANAEROBIC Blood Culture results may not be optimal due to an excessive volume of blood received in culture bottles   Culture   Final  NO GROWTH 5 DAYS Performed at [facility], [place] [address] [place], [place] [address]    Report Status 02/12/2019 FINAL  Final  SARS CORONAVIRUS 2 (TAT 6-24 HRS) Nasopharyngeal Nasopharyngeal Swab     Status: None   Collection Time: 02/07/19  3:54 PM   Specimen: Nasopharyngeal Swab  Result Value Ref Range Status   SARS Coronavirus 2 NEGATIVE NEGATIVE Final    Comment: (NOTE) SARS-CoV-2 target nucleic acids are NOT DETECTED. The SARS-CoV-2 RNA is generally detectable in upper and lower respiratory specimens during the acute phase of infection. Negative results do not preclude SARS-CoV-2 infection, do not rule out co-infections with other pathogens, and should not be used as the sole basis for treatment or other patient management decisions. Negative results must be combined with clinical observations, patient history, and epidemiological information. The expected result is Negative. Fact Sheet for Patients: SugarRoll.be Fact Sheet for Healthcare Providers: https://www.woods-mathews.com/ This test is not yet approved or cleared by the [place] FDA and  has been authorized for detection and/or diagnosis of SARS-CoV-2 by FDA under an Emergency Use Authorization (EUA). This EUA will remain  in effect (meaning this test can be used) for the duration of the COVID-19 declaration under Section 56 4(b)(1) of the Act, 21 U.S.C. section 360bbb-3(b)(1), unless the authorization is terminated or revoked sooner. Performed at [facility], [place] [address] [place], [place] [address]   CSF culture     Status: None   Collection Time: 02/09/19 11:36 AM   Specimen: PATH Cytology CSF; Cerebrospinal Fluid  Result Value Ref Range Status   Specimen Description CSF  Final   Special Requests NONE  Final   Gram Stain   Final    WBC PRESENT,BOTH PMN AND MONONUCLEAR NO ORGANISMS SEEN CYTOSPIN SMEAR    Culture   Final    NO GROWTH 3 DAYS Performed at [facility], [place] [address], [place], [place] [address]    Report Status 02/13/2019 FINAL  Final  CSF culture with Stat gram stain     Status: None   Collection Time: 02/11/19 11:32 AM   Specimen: CSF; Cerebrospinal Fluid  Result Value Ref Range Status   Specimen Description CSF  Final   Special Requests Immunocompromised  Final   Gram Stain   Final    WBC PRESENT, PREDOMINANTLY MONONUCLEAR NO ORGANISMS SEEN CYTOSPIN SMEAR    Culture   Final    NO GROWTH Performed at [facility], [place] [address] [place], [place] [address]    Report Status 02/14/2019 FINAL  Final  CSF culture     Status: None (Preliminary result)   Collection Time: 02/12/19 11:14 AM   Specimen: PATH Cytology CSF; Cerebrospinal Fluid  Result Value Ref Range Status   Specimen Description CSF  Final   Special Requests NONE  Final   Gram Stain   Final    WBC PRESENT, PREDOMINANTLY MONONUCLEAR NO ORGANISMS SEEN CYTOSPIN SMEAR    Culture   Final    NO GROWTH 2 DAYS Performed at [facility], [place] [address], [place], [place] [address]    Report Status PENDING  Incomplete    Janene Madeira, MSN, NP-C [facility] Infectious Disease [email or web address] Pager: [phone] Office: [place]: [phone]   02/14/2019  11:46 AM

## 2019-02-14 NOTE — Progress Notes (Signed)
PROGRESS NOTE    David Whitehead  [id number] DOB: July 05, 1970 DOA: 02/07/2019 PCP: [name], MD   Brief Narrative:  Patient is a 48 year old male with history of CLL, hypertension, diabetes type 2 who presented with headache, diplopia.  He underwent work-up for headache as an outpatient.  He was seen by neurology and underwent lumbar puncture under fluoroscopy and was found to have elevated opening pressure of 55.  He noted immediate improvement in headache after lumbar puncture.  Cryptococcal antigen titers came out to be positive at 1: 64 and patient was sent to the emergency department.  ID consulted after admission.  Started on antibiotics. His symptoms of headache and diplopia have resolved.  Hospital course  remarkable for persistent elevated opening pressure so he has been undergoing series of lumbar punctures.  Plan for another puncture lumbar  Tonight.  Assessment & Plan:   Principal Problem:   Cryptococcal meningitis ([place]) Active Problems:   CLL (chronic lymphocytic leukemia) (HCC)   Pulmonary nodule   Hypertension   Cryptococcal meningitis: Continue amphotericin and 5 flucytosine.  Day 8.  ID following.  CSF cultures have been negative. Persistent elevated opening pressure.  Lumbar drain was planned  but neurosurgery does not think that it is indicated.  Neurosurgery doing a bedside lumbar puncture and lateral decubitus position to accurately measure the opening pressure.  CLL: Follows with oncology.  He was on ibrutinib which is on hold.  Case was discussed with oncology, Dr. Maylon Peppers.He was on  acyclovir and atovaquone for prophylaxis.Atovaquone   has been stopped because ibrutinib is on hold.  Hypertension: Currently blood stable.  Continue metoprolol           DVT prophylaxis: SCD Code Status: Full Family Communication: None present at the bedside Disposition Plan: Home after completion of antibiotic therapy,   Consultants: ID, neurosurgery  Procedures:  LP  Antimicrobials:  Anti-infectives (From admission, onward)   Start     Dose/Rate Route Frequency Ordered Stop   02/07/19 2230  atovaquone (MEPRON) 750 MG/5ML suspension 750 mg  Status:  Discontinued     750 mg Oral Daily with breakfast 02/07/19 1729 02/10/19 1543   02/07/19 2200  acyclovir (ZOVIRAX) tablet 400 mg     400 mg Oral 2 times daily 02/07/19 1729     02/07/19 1800  flucytosine (ANCOBON) capsule 2,500 mg     25 mg/kg  95.7 kg Oral Every 6 hours 02/07/19 1528     02/07/19 1800  amphotericin B liposome (AMBISOME) 250 mg in dextrose 5 % 500 mL IVPB     3 mg/kg  82.1 kg (Adjusted) 250 mL/hr over 120 Minutes Intravenous Every 24 hours 02/07/19 1528        Subjective: Patient seen and examined at bedside this morning.  Hemodynamically stable.  Comfortable.  No active issues.  Denies headache, blurry vision or diplopia.  Objective: Vitals:   02/13/19 1849 02/13/19 2100 02/14/19 0554 02/14/19 0902  BP: (!) 149/93 (!) 146/89 126/85 130/90  Pulse: 92 83 87 90  Resp: 18 18 18 18   Temp: (!) 97.5 F (36.4 C) 98.5 F (36.9 C) 98.4 F (36.9 C) 98 F (36.7 C)  TempSrc: Oral Oral Oral Oral  SpO2: 97% 100% 98% 95%  Weight:      Height:        Intake/Output Summary (Last 24 hours) at 02/14/2019 1522 Last data filed at 02/14/2019 1215 Gross per 24 hour  Intake 460 ml  Output 0 ml  Net 460 ml  Filed Weights   02/11/19 2042 02/12/19 0401 02/12/19 2100  Weight: 95.2 kg 93.1 kg 93.1 kg    Examination:  General exam: Appears calm and comfortable ,Not in distress,average built HEENT:PERRL,Oral mucosa moist, Ear/Nose normal on gross exam Respiratory system: Bilateral equal air entry, normal vesicular breath sounds, no wheezes or crackles  Cardiovascular system: S1 & S2 heard, RRR. No JVD, murmurs, rubs, gallops or clicks. No pedal edema. Gastrointestinal system: Abdomen is nondistended, soft and nontender. No organomegaly or masses felt. Normal bowel sounds heard. Central  nervous system: Alert and oriented. No focal neurological deficits. Extremities: No edema, no clubbing ,no cyanosis, distal peripheral pulses palpable. Skin: No rashes, lesions or ulcers,no icterus ,no pallor MSK: Normal muscle bulk,tone ,power Psychiatry: Judgement and insight appear normal. Mood & affect appropriate.     Data Reviewed: I have personally reviewed following labs and imaging studies  CBC: Recent Labs  Lab 02/09/19 0446 02/11/19 0422 02/12/19 0336 02/13/19 0415 02/14/19 0356  WBC 11.4* 11.3* 12.5* 12.3* 11.6*  NEUTROABS 6.5  --   --   --   --   HGB 13.9 13.6 14.4 14.6 14.6  HCT 40.7 40.9 43.9 44.2 43.9  MCV 82.2 82.5 82.8 83.4 82.2  PLT 214 204 201 185 [id number]   Basic Metabolic Panel: Recent Labs  Lab 02/10/19 0414 02/11/19 0422 02/12/19 0336 02/13/19 0415 02/14/19 0356  NA 140 139 140 139 141  K 3.3* 3.4* 3.9 3.8 4.1  CL 111 109 110 109 111  CO2 22 23 22 22 22   GLUCOSE 111* 108* 105* 109* 108*  BUN 6 6 7  5* 8  CREATININE 0.80 0.99 0.84 0.82 1.06  CALCIUM 8.8* 8.9 9.1 9.0 9.0  MG 2.2 2.2 2.3 2.3 2.1  PHOS  --  3.9 4.5  --   --    GFR: Estimated Creatinine Clearance: 99.8 mL/min (by C-G formula based on SCr of 1.06 mg/dL). Liver Function Tests: Recent Labs  Lab 02/08/19 0604 02/09/19 0446 02/11/19 0422 02/12/19 0336  AST 14* 12*  --   --   ALT 23 22  --   --   ALKPHOS 28* 30*  --   --   BILITOT 0.9 0.6  --   --   PROT 5.6* 5.5*  --   --   ALBUMIN 3.5 3.3* 3.1* 3.4*   No results for input(s): LIPASE, AMYLASE in the last 168 hours. No results for input(s): AMMONIA in the last 168 hours. Coagulation Profile: No results for input(s): INR, PROTIME in the last 168 hours. Cardiac Enzymes: No results for input(s): CKTOTAL, CKMB, CKMBINDEX, TROPONINI in the last 168 hours. BNP (last 3 results) No results for input(s): PROBNP in the last 8760 hours. HbA1C: No results for input(s): HGBA1C in the last 72 hours. CBG: Recent Labs  Lab 02/12/19 1221   GLUCAP 98   Lipid Profile: No results for input(s): CHOL, HDL, LDLCALC, TRIG, CHOLHDL, LDLDIRECT in the last 72 hours. Thyroid Function Tests: No results for input(s): TSH, T4TOTAL, FREET4, T3FREE, THYROIDAB in the last 72 hours. Anemia Panel: No results for input(s): VITAMINB12, FOLATE, FERRITIN, TIBC, IRON, RETICCTPCT in the last 72 hours. Sepsis Labs: No results for input(s): PROCALCITON, LATICACIDVEN in the last 168 hours.  Recent Results (from the past 240 hour(s))  Culture, blood (Routine x 2)     Status: None   Collection Time: 02/07/19  1:50 PM   Specimen: BLOOD LEFT ARM  Result Value Ref Range Status   Specimen Description BLOOD LEFT ARM  Final   Special Requests   Final    BOTTLES DRAWN AEROBIC AND ANAEROBIC Blood Culture results may not be optimal due to an excessive volume of blood received in culture bottles   Culture   Final    NO GROWTH 5 DAYS Performed at [facility], [place] [address], [place], [place] [address]    Report Status 02/12/2019 FINAL  Final  Culture, blood (Routine x 2)     Status: None   Collection Time: 02/07/19  3:06 PM   Specimen: BLOOD RIGHT ARM  Result Value Ref Range Status   Specimen Description BLOOD RIGHT ARM  Final   Special Requests   Final    BOTTLES DRAWN AEROBIC AND ANAEROBIC Blood Culture results may not be optimal due to an excessive volume of blood received in culture bottles   Culture   Final    NO GROWTH 5 DAYS Performed at [facility], [place] [address], [place], [place] [address]    Report Status 02/12/2019 FINAL  Final  SARS CORONAVIRUS 2 (TAT 6-24 HRS) Nasopharyngeal Nasopharyngeal Swab     Status: None   Collection Time: 02/07/19  3:54 PM   Specimen: Nasopharyngeal Swab  Result Value Ref Range Status   SARS Coronavirus 2 NEGATIVE NEGATIVE Final    Comment: (NOTE) SARS-CoV-2 target nucleic acids are NOT DETECTED. The SARS-CoV-2 RNA is generally detectable in upper and lower respiratory specimens during the  acute phase of infection. Negative results do not preclude SARS-CoV-2 infection, do not rule out co-infections with other pathogens, and should not be used as the sole basis for treatment or other patient management decisions. Negative results must be combined with clinical observations, patient history, and epidemiological information. The expected result is Negative. Fact Sheet for Patients: SugarRoll.be Fact Sheet for Healthcare Providers: https://www.woods-mathews.com/ This test is not yet approved or cleared by the [place] FDA and  has been authorized for detection and/or diagnosis of SARS-CoV-2 by FDA under an Emergency Use Authorization (EUA). This EUA will remain  in effect (meaning this test can be used) for the duration of the COVID-19 declaration under Section 56 4(b)(1) of the Act, 21 U.S.C. section 360bbb-3(b)(1), unless the authorization is terminated or revoked sooner. Performed at [facility], [place] [address] [place], [place] [address]   CSF culture     Status: None   Collection Time: 02/09/19 11:36 AM   Specimen: PATH Cytology CSF; Cerebrospinal Fluid  Result Value Ref Range Status   Specimen Description CSF  Final   Special Requests NONE  Final   Gram Stain   Final    WBC PRESENT,BOTH PMN AND MONONUCLEAR NO ORGANISMS SEEN CYTOSPIN SMEAR    Culture   Final    NO GROWTH 3 DAYS Performed at [facility], [place] [address], [place], [place] [address]    Report Status 02/13/2019 FINAL  Final  CSF culture with Stat gram stain     Status: None   Collection Time: 02/11/19 11:32 AM   Specimen: CSF; Cerebrospinal Fluid  Result Value Ref Range Status   Specimen Description CSF  Final   Special Requests Immunocompromised  Final   Gram Stain   Final    WBC PRESENT, PREDOMINANTLY MONONUCLEAR NO ORGANISMS SEEN CYTOSPIN SMEAR    Culture   Final    NO GROWTH Performed at [facility], [place] [address], [place], [place] [address]    Report Status 02/14/2019 FINAL  Final  CSF culture  Status: None (Preliminary result)   Collection Time: 02/12/19 11:14 AM   Specimen: PATH Cytology CSF; Cerebrospinal Fluid  Result Value Ref Range Status   Specimen Description CSF  Final   Special Requests NONE  Final   Gram Stain   Final    WBC PRESENT, PREDOMINANTLY MONONUCLEAR NO ORGANISMS SEEN CYTOSPIN SMEAR    Culture   Final    NO GROWTH 2 DAYS Performed at [facility], [place] [address], [place], [place] [address]    Report Status PENDING  Incomplete         Radiology Studies: No results found.      Scheduled Meds: . acyclovir  400 mg Oral BID  . Chlorhexidine Gluconate Cloth  6 each Topical Daily  . dextrose  10 mL Intravenous Q24H  . dextrose  10 mL Intravenous Q24H  . flucytosine  25 mg/kg Oral Q6H  . lidocaine (PF)  5 mL Infiltration Once  . loratadine  10 mg Oral Daily  . metoprolol tartrate  12.5 mg Oral BID  . potassium chloride  40 mEq Oral TID  . sodium chloride  500 mL Intravenous Q24H  . sodium chloride  500 mL Intravenous Q24H   Continuous Infusions: . sodium chloride 75 mL/hr at 02/14/19 1300  . amphotericin  B  Liposome (AMBISOME) ADULT IV 250 mg (02/13/19 1808)     LOS: 7 days    Time spent: 35 mins.More than 50% of that time was spent in counseling and/or coordination of care.      Shelly Coss, MD [facility] Pager [phone]  If 7PM-7AM, please contact night-coverage [email or web address] Password [email or web address] 02/14/2019, 3:22 PM

## 2019-02-14 NOTE — Plan of Care (Signed)
  Problem: Health Behavior/Discharge Planning: Goal: Ability to manage health-related needs will improve Outcome: Progressing   Problem: Clinical Measurements: Goal: Respiratory complications will improve Outcome: Progressing Goal: Cardiovascular complication will be avoided Outcome: Progressing   Problem: Education: Goal: Knowledge of General Education information will improve Description: Including pain rating scale, medication(s)/side effects and non-pharmacologic comfort measures Outcome: Progressing   Problem: Health Behavior/Discharge Planning: Goal: Ability to manage health-related needs will improve Outcome: Progressing   Problem: Clinical Measurements: Goal: Ability to maintain clinical measurements within normal limits will improve Outcome: Progressing Goal: Respiratory complications will improve Outcome: Progressing Goal: Cardiovascular complication will be avoided Outcome: Progressing   Problem: Nutrition: Goal: Adequate nutrition will be maintained Outcome: Progressing   Problem: Coping: Goal: Level of anxiety will decrease Outcome: Progressing   Problem: Elimination: Goal: Will not experience complications related to bowel motility Outcome: Progressing Goal: Will not experience complications related to urinary retention Outcome: Progressing   Problem: Pain Managment: Goal: General experience of comfort will improve Outcome: Progressing   Problem: Safety: Goal: Ability to remain free from injury will improve Outcome: Progressing   Problem: Skin Integrity: Goal: Risk for impaired skin integrity will decrease Outcome: Progressing

## 2019-02-14 NOTE — Progress Notes (Signed)
Pharmacy Antibiotic Note  David Whitehead is a 48 y.o. 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 Pharmacy has been consulted for amphotericin B liposome dosing. Ambisome 3 mg/kg and flucytosine 25 mg/kg started in this patient with suspected cryptococcal meningitis. NS 5102mL ordered to be given an hour before and an hour after the Ambisome infusion with D5 flushes administered inbetween. The pharmacy team will monitor the patients renal function, potassium and magnesium and make adjustments per protocol.  Potassium today is within normal limits at 4.1 on potassium chloride 40 mEq TID scheduled. Magnesium is stable at 2.1.   SCr is up slightly to 1.06 from 0.82 yesterday. Will watch renal function closely   Plan: Continue Ambisome 250 mg q24h (3 mg/kg) Continue Flucytosine 2,500 mg q6h (25 mg/kg) Continue potassium 40 mEq TID  Continue to replace Mg and K as needed Monitor Scr and CBC   Height: 5\' 10"  (177.8 cm) Weight: 205 lb 4 oz (93.1 kg) IBW/kg (Calculated) : 73  Temp (24hrs), Avg:98.1 F (36.7 C), Min:97.5 F (36.4 C), Max:98.5 F (36.9 C)  Recent Labs  Lab 02/07/19 1520  02/09/19 0446 02/10/19 0414 02/11/19 0422 02/12/19 0336 02/13/19 0415 02/14/19 0356  WBC 13.2*   < > 11.4*  --  11.3* 12.5* 12.3* 11.6*  CREATININE 0.97   < > 0.85 0.80 0.99 0.84 0.82 1.06  LATICACIDVEN 1.3  --   --   --   --   --   --   --    < > = values in this interval not displayed.    Estimated Creatinine Clearance: 99.8 mL/min (by C-G formula based on SCr of 1.06 mg/dL).    Allergies  Allergen Reactions  . Bactrim [Sulfamethoxazole-Trimethoprim] Rash  . Latex Other (See Comments)    Took skin off when removed tape  . Amoxicillin Rash    Has patient had a PCN reaction causing immediate rash, facial/tongue/throat swelling, SOB or lightheadedness with hypotension: No Has patient had a PCN reaction causing severe rash involving mucus membranes or skin necrosis: No Has patient  had a PCN reaction that required hospitalization: No Has patient had a PCN reaction occurring within the last 10 years: No If all of the above answers are "NO", then may proceed with Cephalosporin use.     Antimicrobials this admission: Ambisome  9/28>> Flucytosine 9/28>>  Microbiology results: 9/28 BCx: No growth final  9/30; 10/2; 10/3 CSF culture: NGTD   Thank you for allowing pharmacy to be a part of this patient's care.  Jimmy Footman, PharmD, BCPS, BCIDP Infectious Diseases Clinical Pharmacist Phone: [phone] 02/14/2019 1:10 PM

## 2019-02-15 LAB — BASIC METABOLIC PANEL
Anion gap: 10 (ref 5–15)
BUN: 8 mg/dL (ref 6–20)
CO2: 21 mmol/L — ABNORMAL LOW (ref 22–32)
Calcium: 9.1 mg/dL (ref 8.9–10.3)
Chloride: 110 mmol/L (ref 98–111)
Creatinine, Ser: 1.07 mg/dL (ref 0.61–1.24)
GFR calc Af Amer: >60 mL/min (ref >60)
GFR calc non Af Amer: >60 mL/min (ref >60)
Glucose, Bld: 107 mg/dL — ABNORMAL HIGH (ref 70–99)
Potassium: 4.3 mmol/L (ref 3.5–5.1)
Sodium: 141 mmol/L (ref 135–145)

## 2019-02-15 LAB — CBC
HCT: 43.9 % (ref 39.0–52.0)
Hemoglobin: 14.6 g/dL (ref 13.0–17.0)
MCH: 27.7 pg (ref 26.0–34.0)
MCHC: 33.3 g/dL (ref 30.0–36.0)
MCV: 83.3 fL (ref 80.0–100.0)
Platelets: 147 10*3/uL — ABNORMAL LOW (ref 150–400)
RBC: 5.27 MIL/uL (ref 4.22–5.81)
RDW: 13.7 % (ref 11.5–15.5)
WBC: 11.7 10*3/uL — ABNORMAL HIGH (ref 4.0–10.5)
nRBC: 0 % (ref 0.0–0.2)

## 2019-02-15 LAB — CSF CULTURE W GRAM STAIN: Culture: NO GROWTH

## 2019-02-15 LAB — MAGNESIUM: Magnesium: 2.2 mg/dL (ref 1.7–2.4)

## 2019-02-15 MED ORDER — POTASSIUM CHLORIDE CRYS ER 20 MEQ PO TBCR: 40.0000 meq | EXTENDED_RELEASE_TABLET | Freq: Every day | ORAL | Status: DC

## 2019-02-15 NOTE — Consult Note (Signed)
Subjective: No new complaints. States headaches and tinnitus are still present though improved from yesterday in that they have decreased in frequency since LP.   Antibiotics:  Anti-infectives (From admission, onward)   Start     Dose/Rate Route Frequency Ordered Stop   02/07/19 2230  atovaquone (MEPRON) 750 MG/5ML suspension 750 mg  Status:  Discontinued     750 mg Oral Daily with breakfast 02/07/19 1729 02/10/19 1543   02/07/19 2200  acyclovir (ZOVIRAX) tablet 400 mg     400 mg Oral 2 times daily 02/07/19 1729     02/07/19 1800  flucytosine (ANCOBON) capsule 2,500 mg     25 mg/kg  95.7 kg Oral Every 6 hours 02/07/19 1528     02/07/19 1800  amphotericin B liposome (AMBISOME) 250 mg in dextrose 5 % 500 mL IVPB     3 mg/kg  82.1 kg (Adjusted) 250 mL/hr over 120 Minutes Intravenous Every 24 hours 02/07/19 1528       Medications: Scheduled Meds: . acyclovir  400 mg Oral BID  . Chlorhexidine Gluconate Cloth  6 each Topical Daily  . dextrose  10 mL Intravenous Q24H  . dextrose  10 mL Intravenous Q24H  . flucytosine  25 mg/kg Oral Q6H  . loratadine  10 mg Oral Daily  . metoprolol tartrate  12.5 mg Oral BID  . potassium chloride  40 mEq Oral TID  . sodium chloride  500 mL Intravenous Q24H  . sodium chloride  500 mL Intravenous Q24H   Continuous Infusions: . sodium chloride 75 mL/hr at 02/15/19 0948  . amphotericin  B  Liposome (AMBISOME) ADULT IV Stopped (02/14/19 2133)   PRN Meds:.acetaminophen, acetaminophen, diphenhydrAMINE **OR** diphenhydrAMINE, meperidine (DEMEROL) injection, sodium chloride flush  Objective: Weight change:   Intake/Output Summary (Last 24 hours) at 02/15/2019 1105 Last data filed at 02/15/2019 0900 Gross per 24 hour  Intake 3355.96 ml  Output 0 ml  Net 3355.96 ml   Blood pressure (!) 124/92, pulse 90, temperature 99.2 F (37.3 C), temperature source Oral, resp. rate 18, height 5\' 10"  (1.778 m), weight 92.4 kg, SpO2 96 %. Temp:  [98.2 F  (36.8 C)-99.2 F (37.3 C)] 99.2 F (37.3 C) (10/06 0909) Pulse Rate:  [72-95] 90 (10/06 0909) Resp:  [16-20] 18 (10/06 0909) BP: (124-150)/(87-96) 124/92 (10/06 0909) SpO2:  [96 %-100 %] 96 % (10/06 0909) Weight:  [92.4 kg] 92.4 kg (10/06 0514)  Physical Exam: General: Alert and awake, oriented x3, not in any acute distress. HEENT: anicteric sclera, EOMI CVS regular rate, normal  Chest: no wheezing, no respiratory distress Abdomen: soft non-distended,  Extremities: no edema or deformity noted bilaterally Skin: no rashes Neuro: nonfocal  CBC: CBC Latest Ref Rng & Units 02/15/2019 02/14/2019 02/13/2019  WBC 4.0 - 10.5 K/uL 11.7(H) 11.6(H) 12.3(H)  Hemoglobin 13.0 - 17.0 g/dL 14.6 14.6 14.6  Hematocrit 39.0 - 52.0 % 43.9 43.9 44.2  Platelets 150 - 400 K/uL 147(L) 177 185    BMET Recent Labs    02/14/19 0356 02/15/19 0316  NA 141 141  K 4.1 4.3  CL 111 110  CO2 22 21*  GLUCOSE 108* 107*  BUN 8 8  CREATININE 1.06 1.07  CALCIUM 9.0 9.1    Liver Panel No results for input(s): PROT, ALBUMIN, AST, ALT, ALKPHOS, BILITOT, BILIDIR, IBILI in the last 72 hours.  Sedimentation Rate No results for input(s): ESRSEDRATE in the last 72 hours. C-Reactive Protein No results for input(s): CRP in  the last 72 hours.  Micro Results: Recent Results (from the past 720 hour(s))  CSF culture     Status: Abnormal   Collection Time: 02/04/19  9:30 AM   Specimen: Lumbar Puncture; Cerebrospinal Fluid  Result Value Ref Range Status   MICRO NUMBER: [id number]  Final   SPECIMEN QUALITY: Adequate  Final   Source CEREBROSPINAL FLUID (CSF)  Final   STATUS: FINAL  Final   GRAM STAIN: (A)  Final    Many White blood cells seen No organisms seen Gram stain prepared by cytospin   ISOLATE 1: Cryptococcus neoformans (AA)  Final    Comment: Scant growth of Cryptococcus neoformans   MYCOBACTERIA, CULTURE, WITH FLUOROCHROME SMEAR     Status: None (Preliminary result)   Collection Time: 02/04/19  9:30 AM   Result Value Ref Range Status   MICRO NUMBER: [id number]  Preliminary   SPECIMEN QUALITY: Adequate  Preliminary   Source: CEREBROSPINAL FLUID (CSF)  Preliminary   STATUS: PRELIMINARY  Preliminary   SMEAR: No acid fast bacilli seen.  Preliminary   RESULT:   Preliminary    Culture results to follow. Final reports of negative cultures can be expected in approximately six weeks. Positive cultures are reported immediately.  Culture, blood (Routine x 2)     Status: None   Collection Time: 02/07/19  1:50 PM   Specimen: BLOOD LEFT ARM  Result Value Ref Range Status   Specimen Description BLOOD LEFT ARM  Final   Special Requests   Final    BOTTLES DRAWN AEROBIC AND ANAEROBIC Blood Culture results may not be optimal due to an excessive volume of blood received in culture bottles   Culture   Final    NO GROWTH 5 DAYS Performed at [facility], [place] [address], [place], [place] [address]    Report Status 02/12/2019 FINAL  Final  Culture, blood (Routine x 2)     Status: None   Collection Time: 02/07/19  3:06 PM   Specimen: BLOOD RIGHT ARM  Result Value Ref Range Status   Specimen Description BLOOD RIGHT ARM  Final   Special Requests   Final    BOTTLES DRAWN AEROBIC AND ANAEROBIC Blood Culture results may not be optimal due to an excessive volume of blood received in culture bottles   Culture   Final    NO GROWTH 5 DAYS Performed at [facility], [place] [address], [place], [place] [address]    Report Status 02/12/2019 FINAL  Final  SARS CORONAVIRUS 2 (TAT 6-24 HRS) Nasopharyngeal Nasopharyngeal Swab     Status: None   Collection Time: 02/07/19  3:54 PM   Specimen: Nasopharyngeal Swab  Result Value Ref Range Status   SARS Coronavirus 2 NEGATIVE NEGATIVE Final    Comment: (NOTE) SARS-CoV-2 target nucleic acids are NOT DETECTED. The SARS-CoV-2 RNA is generally detectable in upper and lower respiratory specimens during the acute phase of infection. Negative results do not preclude  SARS-CoV-2 infection, do not rule out co-infections with other pathogens, and should not be used as the sole basis for treatment or other patient management decisions. Negative results must be combined with clinical observations, patient history, and epidemiological information. The expected result is Negative. Fact Sheet for Patients: SugarRoll.be Fact Sheet for Healthcare Providers: https://www.woods-mathews.com/ This test is not yet approved or cleared by the [place] FDA and  has been authorized for detection and/or diagnosis of SARS-CoV-2 by FDA under an Emergency Use Authorization (EUA). This EUA will remain  in effect (meaning this test can be used) for the duration of the COVID-19 declaration under Section 56 4(b)(1) of the Act, 21 U.S.C. section 360bbb-3(b)(1), unless the authorization is terminated or revoked sooner. Performed at [facility], [place] [address], [place], [place] [address]   CSF culture     Status: None   Collection Time: 02/09/19 11:36 AM   Specimen: PATH Cytology CSF; Cerebrospinal Fluid  Result Value Ref Range Status   Specimen Description CSF  Final   Special Requests NONE  Final   Gram Stain   Final    WBC PRESENT,BOTH PMN AND MONONUCLEAR NO ORGANISMS SEEN CYTOSPIN SMEAR    Culture   Final    NO GROWTH 3 DAYS Performed at [facility], [place] [address], [place], [place] [address]    Report Status 02/13/2019 FINAL  Final  CSF culture with Stat gram stain     Status: None   Collection Time: 02/11/19 11:32 AM   Specimen: CSF; Cerebrospinal Fluid  Result Value Ref Range Status   Specimen Description CSF  Final   Special Requests Immunocompromised  Final   Gram Stain   Final    WBC PRESENT, PREDOMINANTLY MONONUCLEAR NO ORGANISMS SEEN CYTOSPIN SMEAR    Culture   Final    NO GROWTH Performed at [facility], [place] [address], [place], [place] [address]    Report Status 02/14/2019 FINAL   Final  CSF culture     Status: None   Collection Time: 02/12/19 11:14 AM   Specimen: PATH Cytology CSF; Cerebrospinal Fluid  Result Value Ref Range Status   Specimen Description CSF  Final   Special Requests NONE  Final   Gram Stain   Final    WBC PRESENT, PREDOMINANTLY MONONUCLEAR NO ORGANISMS SEEN CYTOSPIN SMEAR    Culture   Final    NO GROWTH 3 DAYS Performed at [facility], [place] [address], [place], [place] [address]    Report Status 02/15/2019 FINAL  Final  CSF culture with Stat gram stain     Status: None (Preliminary result)   Collection Time: 02/14/19  7:53 PM   Specimen: CSF; Cerebrospinal Fluid  Result Value Ref Range Status   Specimen Description CSF  Final   Special Requests Normal  Final   Gram Stain   Final    WBC PRESENT, PREDOMINANTLY PMN NO ORGANISMS SEEN CYTOSPIN SMEAR    Culture   Final    NO GROWTH < 24 HOURS Performed at [facility], [place] [address] [place],  [address]    Report Status PENDING  Incomplete    Studies/Results: No results found.   Assessment/Plan:  INTERVAL HISTORY:  LP performed at the bedside yesterday. Opening pressure of 33cm H2O with 14cc CSF removed. CSF cell count of 1,725 RBC, 63 WBC, and 91 lymphs. Total protein elevated to 82, and Glucose mildly decreased to 39. Cryptococcal antigen positive with antigen titer <5. CSF culture with no organisms on gram stain and no growth in <24hr.   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 Chemo currently on hold.  Currently on flucytosine and L-ampho with pre/post hydration per pharm. Electrolytes and kidney function stable with current replacement regimen.   Because of pt's continued neurologic complications and elevated  opening pressures,  he will need a longer course (greater than 4 weeks) of IV induction therapy, per IDSA guidelines. His symptoms continue to improve after LPs, and he has had 5 in the last 11 days. Pt may need consideration for CSF diversion if intracranial pressures remain poorly controlled.  Plan 1. Continue antifungals with pre/post hydration 2. Electrolyte replacement and monitoring per pharmacy protocol 3. Continue to monitor for neurologic symptoms and signs of increasing ICP 4. Plan for LP again tomorrow    LOS: 8 days   Ladona Horns 02/15/2019, 11:05 AM

## 2019-02-15 NOTE — Progress Notes (Signed)
Pharmacy Antibiotic Note  David Whitehead is a 48 y.o. 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 Pharmacy has been consulted for amphotericin B liposome dosing. Ambisome 3 mg/kg and flucytosine 25 mg/kg started in this patient with suspected cryptococcal meningitis. NS 543mL ordered to be given an hour before and an hour after the Ambisome infusion with D5 flushes administered inbetween. The pharmacy team will monitor the patients renal function, potassium and magnesium and make adjustments per protocol.  Potassium today is within normal limits at 4.3 on potassium chloride 40 mEq TID scheduled. Magnesium is stable at 2.2. May consider decreasing potassium tomorrow if levels continue to climb.   SCr is up slightly to 1.07 from 1.06 yesterday. Will watch renal function closely . May need to increase fluids to 1 L pre and post Ambisome dose.   Plan: Continue Ambisome 250 mg q24h (3 mg/kg) Continue Flucytosine 2,500 mg q6h (25 mg/kg) Continue potassium 40 mEq TID  Continue to replace Mg and K as needed Monitor Scr and CBC   Height: 5\' 10"  (177.8 cm) Weight: 203 lb 12.8 oz (92.4 kg) IBW/kg (Calculated) : 73  Temp (24hrs), Avg:98.8 F (37.1 C), Min:98.2 F (36.8 C), Max:99.2 F (37.3 C)  Recent Labs  Lab 02/11/19 0422 02/12/19 0336 02/13/19 0415 02/14/19 0356 02/15/19 0316  WBC 11.3* 12.5* 12.3* 11.6* 11.7*  CREATININE 0.99 0.84 0.82 1.06 1.07    Estimated Creatinine Clearance: 98.6 mL/min (by C-G formula based on SCr of 1.07 mg/dL).    Allergies  Allergen Reactions  . Bactrim [Sulfamethoxazole-Trimethoprim] Rash  . Latex Other (See Comments)    Took skin off when removed tape  . Amoxicillin Rash    Has patient had a PCN reaction causing immediate rash, facial/tongue/throat swelling, SOB or lightheadedness with hypotension: No Has patient had a PCN reaction causing severe rash involving mucus membranes or skin necrosis: No Has patient had a PCN reaction that  required hospitalization: No Has patient had a PCN reaction occurring within the last 10 years: No If all of the above answers are "NO", then may proceed with Cephalosporin use.     Antimicrobials this admission: Ambisome  9/28>> Flucytosine 9/28>>  Microbiology results: 9/28 BCx: No growth final  9/30; 10/2; 10/3 CSF culture: NGTD   Thank you for allowing pharmacy to be a part of this patient's care.  Jimmy Footman, PharmD, BCPS, BCIDP Infectious Diseases Clinical Pharmacist Phone: [phone] 02/15/2019 9:56 AM

## 2019-02-15 NOTE — Progress Notes (Addendum)
PROGRESS NOTE    David Whitehead  [id number] DOB: [birth year] DOA: 02/07/2019 PCP: [name], MD   Brief Narrative:  Patient is a 48 year old male with history of CLL, hypertension, diabetes type 2 who presented with headache, diplopia.  He underwent work-up for headache as an outpatient.  He was seen by neurology and underwent lumbar puncture under fluoroscopy and was found to have elevated opening pressure of 55.  He noted immediate improvement in headache after lumbar puncture.  Cryptococcal antigen titers came out to be positive at 1: 64 and patient was sent to the emergency department.  ID consulted after admission.  Started on antibiotics. His symptoms of headache and diplopia have significantly  improved.  Hospital course  remarkable for persistent elevated opening pressure so he has been undergoing series of lumbar punctures.  Plan for another puncture lumbar  tomorrow.  Assessment & Plan:   Principal Problem:   Cryptococcal meningitis ([place]) Active Problems:   CLL (chronic lymphocytic leukemia) (HCC)   Pulmonary nodule   Hypertension   Cryptococcal meningitis: Continue amphotericin and 5 flucytosine.  Day 9.  ID following.  CSF cultures have been negative. Persistent elevated opening pressure.  Lumbar drain was planned  but neurosurgery does not think that it is indicated.  Neurosurgery did a bedside lumbar puncture and lateral decubitus position to accurately measure the opening pressure.Opening pressure yesterday was 33.(Normal is 10-20 cm H2O). ID planning for 4 weeks of IV therapy before transitioning to oral fluconazole.  Undergoing frequent lumbar puncture until opening pressure comes to normal range.   CSF culture has not shown any growth till date. Continuing monitoring electrolytes and replace as necessary while on antibiotics.  CLL: Follows with oncology.  He was on ibrutinib which is on hold.  Case was discussed with oncology, Dr. Maylon Peppers.He was on  acyclovir and  atovaquone for prophylaxis.Atovaquone   has been stopped because ibrutinib is on hold.  Hypertension: Currently blood stable.  Continue metoprolol           DVT prophylaxis: SCD Code Status: Full Family Communication: None present at the bedside.Patient communicating with his family. Disposition Plan: Home after completion of IV antibiotic therapy,ID clearance   Consultants: ID, neurosurgery  Procedures: LP  Antimicrobials:  Anti-infectives (From admission, onward)   Start     Dose/Rate Route Frequency Ordered Stop   02/07/19 2230  atovaquone (MEPRON) 750 MG/5ML suspension 750 mg  Status:  Discontinued     750 mg Oral Daily with breakfast 02/07/19 1729 02/10/19 1543   02/07/19 2200  acyclovir (ZOVIRAX) tablet 400 mg     400 mg Oral 2 times daily 02/07/19 1729     02/07/19 1800  flucytosine (ANCOBON) capsule 2,500 mg     25 mg/kg  95.7 kg Oral Every 6 hours 02/07/19 1528     02/07/19 1800  amphotericin B liposome (AMBISOME) 250 mg in dextrose 5 % 500 mL IVPB     3 mg/kg  82.1 kg (Adjusted) 250 mL/hr over 120 Minutes Intravenous Every 24 hours 02/07/19 1528        Subjective: Patient seen and examined the bedside this morning.  Hemodynamically stable.  Looked comfortable.  Complains of mild headache on bilateral temple region.  No visual problems.  Objective: Vitals:   02/14/19 1619 02/14/19 1959 02/15/19 0514 02/15/19 0909  BP: (!) 150/92 129/87 (!) 127/96 (!) 124/92  Pulse: 72 95 92 90  Resp: 20 18 16 18   Temp: 98.2 F (36.8 C) 98.8 F (37.1 C)  98.9 F (37.2 C) 99.2 F (37.3 C)  TempSrc: Oral Oral Oral Oral  SpO2: 100% 98% 100% 96%  Weight:   92.4 kg   Height:        Intake/Output Summary (Last 24 hours) at 02/15/2019 1326 Last data filed at 02/15/2019 1300 Gross per 24 hour  Intake 3435.96 ml  Output 0 ml  Net 3435.96 ml   Filed Weights   02/12/19 0401 02/12/19 2100 02/15/19 0514  Weight: 93.1 kg 93.1 kg 92.4 kg    Examination:  General exam:  Appears calm and comfortable ,Not in distress,average built HEENT:PERRL,Oral mucosa moist, Ear/Nose normal on gross exam Respiratory system: Bilateral equal air entry, normal vesicular breath sounds, no wheezes or crackles  Cardiovascular system: S1 & S2 heard, RRR. No JVD, murmurs, rubs, gallops or clicks. Gastrointestinal system: Abdomen is nondistended, soft and nontender. No organomegaly or masses felt. Normal bowel sounds heard. Central nervous system: Alert and oriented. No focal neurological deficits. Extremities: No edema, no clubbing ,no cyanosis, distal peripheral pulses palpable. Skin: No rashes, lesions or ulcers,no icterus ,no pallor    Data Reviewed: I have personally reviewed following labs and imaging studies  CBC: Recent Labs  Lab 02/09/19 0446 02/11/19 0422 02/12/19 0336 02/13/19 0415 02/14/19 0356 02/15/19 0316  WBC 11.4* 11.3* 12.5* 12.3* 11.6* 11.7*  NEUTROABS 6.5  --   --   --   --   --   HGB 13.9 13.6 14.4 14.6 14.6 14.6  HCT 40.7 40.9 43.9 44.2 43.9 43.9  MCV 82.2 82.5 82.8 83.4 82.2 83.3  PLT 214 204 201 185 177 [id number]*   Basic Metabolic Panel: Recent Labs  Lab 02/11/19 0422 02/12/19 0336 02/13/19 0415 02/14/19 0356 02/15/19 0316  NA 139 140 139 141 141  K 3.4* 3.9 3.8 4.1 4.3  CL 109 110 109 111 110  CO2 23 22 22 22  21*  GLUCOSE 108* 105* 109* 108* 107*  BUN 6 7 5* 8 8  CREATININE 0.99 0.84 0.82 1.06 1.07  CALCIUM 8.9 9.1 9.0 9.0 9.1  MG 2.2 2.3 2.3 2.1 2.2  PHOS 3.9 4.5  --   --   --    GFR: Estimated Creatinine Clearance: 98.6 mL/min (by C-G formula based on SCr of 1.07 mg/dL). Liver Function Tests: Recent Labs  Lab 02/09/19 0446 02/11/19 0422 02/12/19 0336  AST 12*  --   --   ALT 22  --   --   ALKPHOS 30*  --   --   BILITOT 0.6  --   --   PROT 5.5*  --   --   ALBUMIN 3.3* 3.1* 3.4*   No results for input(s): LIPASE, AMYLASE in the last 168 hours. No results for input(s): AMMONIA in the last 168 hours. Coagulation Profile: No  results for input(s): INR, PROTIME in the last 168 hours. Cardiac Enzymes: No results for input(s): CKTOTAL, CKMB, CKMBINDEX, TROPONINI in the last 168 hours. BNP (last 3 results) No results for input(s): PROBNP in the last 8760 hours. HbA1C: No results for input(s): HGBA1C in the last 72 hours. CBG: Recent Labs  Lab 02/12/19 1221  GLUCAP 98   Lipid Profile: No results for input(s): CHOL, HDL, LDLCALC, TRIG, CHOLHDL, LDLDIRECT in the last 72 hours. Thyroid Function Tests: No results for input(s): TSH, T4TOTAL, FREET4, T3FREE, THYROIDAB in the last 72 hours. Anemia Panel: No results for input(s): VITAMINB12, FOLATE, FERRITIN, TIBC, IRON, RETICCTPCT in the last 72 hours. Sepsis Labs: No results for input(s): PROCALCITON, LATICACIDVEN in  the last 168 hours.  Recent Results (from the past 240 hour(s))  Culture, blood (Routine x 2)     Status: None   Collection Time: 02/07/19  1:50 PM   Specimen: BLOOD LEFT ARM  Result Value Ref Range Status   Specimen Description BLOOD LEFT ARM  Final   Special Requests   Final    BOTTLES DRAWN AEROBIC AND ANAEROBIC Blood Culture results may not be optimal due to an excessive volume of blood received in culture bottles   Culture   Final    NO GROWTH 5 DAYS Performed at [facility], [place] [address], [place], [place] [address]    Report Status 02/12/2019 FINAL  Final  Culture, blood (Routine x 2)     Status: None   Collection Time: 02/07/19  3:06 PM   Specimen: BLOOD RIGHT ARM  Result Value Ref Range Status   Specimen Description BLOOD RIGHT ARM  Final   Special Requests   Final    BOTTLES DRAWN AEROBIC AND ANAEROBIC Blood Culture results may not be optimal due to an excessive volume of blood received in culture bottles   Culture   Final    NO GROWTH 5 DAYS Performed at [facility], [place] [address], [place], [place] [address]    Report Status 02/12/2019 FINAL  Final  SARS CORONAVIRUS 2 (TAT 6-24 HRS) Nasopharyngeal Nasopharyngeal  Swab     Status: None   Collection Time: 02/07/19  3:54 PM   Specimen: Nasopharyngeal Swab  Result Value Ref Range Status   SARS Coronavirus 2 NEGATIVE NEGATIVE Final    Comment: (NOTE) SARS-CoV-2 target nucleic acids are NOT DETECTED. The SARS-CoV-2 RNA is generally detectable in upper and lower respiratory specimens during the acute phase of infection. Negative results do not preclude SARS-CoV-2 infection, do not rule out co-infections with other pathogens, and should not be used as the sole basis for treatment or other patient management decisions. Negative results must be combined with clinical observations, patient history, and epidemiological information. The expected result is Negative. Fact Sheet for Patients: SugarRoll.be Fact Sheet for Healthcare Providers: https://www.woods-mathews.com/ This test is not yet approved or cleared by the [place] FDA and  has been authorized for detection and/or diagnosis of SARS-CoV-2 by FDA under an Emergency Use Authorization (EUA). This EUA will remain  in effect (meaning this test can be used) for the duration of the COVID-19 declaration under Section 56 4(b)(1) of the Act, 21 U.S.C. section 360bbb-3(b)(1), unless the authorization is terminated or revoked sooner. Performed at [facility], [place] [address] [place], [place] [address]   CSF culture     Status: None   Collection Time: 02/09/19 11:36 AM   Specimen: PATH Cytology CSF; Cerebrospinal Fluid  Result Value Ref Range Status   Specimen Description CSF  Final   Special Requests NONE  Final   Gram Stain   Final    WBC PRESENT,BOTH PMN AND MONONUCLEAR NO ORGANISMS SEEN CYTOSPIN SMEAR    Culture   Final    NO GROWTH 3 DAYS Performed at [facility], [place] [address] [place], [place] [address]    Report Status 02/13/2019 FINAL  Final  CSF culture with Stat gram stain     Status: None   Collection Time: 02/11/19 11:32 AM    Specimen: CSF; Cerebrospinal Fluid  Result Value Ref Range Status   Specimen Description CSF  Final   Special Requests Immunocompromised  Final   Gram Stain  Final    WBC PRESENT, PREDOMINANTLY MONONUCLEAR NO ORGANISMS SEEN CYTOSPIN SMEAR    Culture   Final    NO GROWTH Performed at [facility], [place] [address] [place], [place] [address]    Report Status 02/14/2019 FINAL  Final  CSF culture     Status: None   Collection Time: 02/12/19 11:14 AM   Specimen: PATH Cytology CSF; Cerebrospinal Fluid  Result Value Ref Range Status   Specimen Description CSF  Final   Special Requests NONE  Final   Gram Stain   Final    WBC PRESENT, PREDOMINANTLY MONONUCLEAR NO ORGANISMS SEEN CYTOSPIN SMEAR    Culture   Final    NO GROWTH 3 DAYS Performed at [facility], [place] [address], [place], [place] [address]    Report Status 02/15/2019 FINAL  Final  CSF culture with Stat gram stain     Status: None (Preliminary result)   Collection Time: 02/14/19  7:53 PM   Specimen: CSF; Cerebrospinal Fluid  Result Value Ref Range Status   Specimen Description CSF  Final   Special Requests Normal  Final   Gram Stain   Final    WBC PRESENT, PREDOMINANTLY PMN NO ORGANISMS SEEN CYTOSPIN SMEAR    Culture   Final    NO GROWTH < 24 HOURS Performed at [facility], [place] [address], [place], [place] [address]    Report Status PENDING  Incomplete         Radiology Studies: No results found.      Scheduled Meds: . acyclovir  400 mg Oral BID  . Chlorhexidine Gluconate Cloth  6 each Topical Daily  . dextrose  10 mL Intravenous Q24H  . dextrose  10 mL Intravenous Q24H  . flucytosine  25 mg/kg Oral Q6H  . loratadine  10 mg Oral Daily  . metoprolol tartrate  12.5 mg Oral BID  . potassium chloride  40 mEq Oral TID  . sodium chloride  500 mL Intravenous Q24H  . sodium chloride  500 mL Intravenous Q24H   Continuous Infusions: . sodium chloride 75 mL/hr at 02/15/19 0948  .  amphotericin  B  Liposome (AMBISOME) ADULT IV Stopped (02/14/19 2133)     LOS: 8 days    Time spent: 35 mins.More than 50% of that time was spent in counseling and/or coordination of care.      Shelly Coss, MD [facility] Pager [phone]  If 7PM-7AM, please contact night-coverage [email or web address] Password [email or web address] 02/15/2019, 1:26 PM [email or web address]

## 2019-02-15 NOTE — Plan of Care (Deleted)
  Problem: Clinical Measurements: Goal: Ability to maintain clinical measurements within normal limits will improve Outcome: Adequate for Discharge Goal: Will remain free from infection Outcome: Adequate for Discharge Goal: Diagnostic test results will improve 02/15/2019 1522 by Dolores Hoose, RN Outcome: Adequate for Discharge 02/15/2019 0731 by Dolores Hoose, RN Outcome: Progressing Goal: Respiratory complications will improve Outcome: Adequate for Discharge Goal: Cardiovascular complication will be avoided Outcome: Adequate for Discharge   Problem: Health Behavior/Discharge Planning: Goal: Ability to manage health-related needs will improve 02/15/2019 1522 by Dolores Hoose, RN Outcome: Adequate for Discharge 02/15/2019 0731 by Dolores Hoose, RN Outcome: Progressing

## 2019-02-15 NOTE — Plan of Care (Signed)
  Problem: Health Behavior/Discharge Planning: Goal: Ability to manage health-related needs will improve Outcome: Progressing   Problem: Clinical Measurements: Goal: Diagnostic test results will improve Outcome: Progressing   Problem: Coping: Goal: Level of anxiety will decrease Outcome: Progressing   

## 2019-02-15 NOTE — Progress Notes (Signed)
Patient ID: David Whitehead, male   DOB: Mar 26, 1971, 48 y.o.   MRN: [id number] BP 129/82 (BP Location: Right Arm)   Pulse 83   Temp 99.2 F (37.3 C) (Oral)   Resp 18   Ht 5\' 10"  (1.778 m)   Wt 92.4 kg   SpO2 99%   BMI 29.24 kg/m  If primary service believes a VP shunt is called for, I will not perform an LP tomorrow, and will schedule him for the OR for shunt placement. IR lumbar puncture cancelled. Pre op orders entered.  Alert and oriented x  4, speech is clear and fluent Moving all extremities well No drift Perrl, full eom Have spoken with [name] and explained need for a VP shunt. He understands the risks and benefits including but not limited to bleeding, infection, shunt malfunction, brain damage, stroke, coma, death, and other risks were discussed. He wishes to proceed.

## 2019-02-16 ENCOUNTER — Inpatient Hospital Stay (HOSPITAL_COMMUNITY): Payer: BC Managed Care – PPO

## 2019-02-16 ENCOUNTER — Encounter (HOSPITAL_COMMUNITY)
Admission: EM | Disposition: A | Discharge status: Home / Self Care | Payer: Self-pay | Source: Home / Self Care | Attending: Internal Medicine

## 2019-02-16 DIAGNOSIS — H9319 Tinnitus, unspecified ear: Secondary | ICD-10-CM

## 2019-02-16 DIAGNOSIS — G939 Disorder of brain, unspecified: Secondary | ICD-10-CM

## 2019-02-16 LAB — APTT: aPTT: 26 seconds (ref 24–36)

## 2019-02-16 LAB — CSF CELL COUNT WITH DIFFERENTIAL
Eosinophils, CSF: 1 % (ref 0–1)
Lymphs, CSF: 91 % — ABNORMAL HIGH (ref 40–80)
Monocyte-Macrophage-Spinal Fluid: 7 % — ABNORMAL LOW (ref 15–45)
RBC Count, CSF: 1725 /mm3 — ABNORMAL HIGH
Segmented Neutrophils-CSF: 1 % (ref 0–6)
Tube #: 1
WBC, CSF: 63 /mm3 (ref 0–5)

## 2019-02-16 LAB — CBC
HCT: 43.4 % (ref 39.0–52.0)
Hemoglobin: 14.6 g/dL (ref 13.0–17.0)
MCH: 27.9 pg (ref 26.0–34.0)
MCHC: 33.6 g/dL (ref 30.0–36.0)
MCV: 83 fL (ref 80.0–100.0)
Platelets: 142 10*3/uL — ABNORMAL LOW (ref 150–400)
RBC: 5.23 MIL/uL (ref 4.22–5.81)
RDW: 13.7 % (ref 11.5–15.5)
WBC: 10.5 10*3/uL (ref 4.0–10.5)
nRBC: 0 % (ref 0.0–0.2)

## 2019-02-16 LAB — BASIC METABOLIC PANEL
Anion gap: 9 (ref 5–15)
BUN: 8 mg/dL (ref 6–20)
CO2: 22 mmol/L (ref 22–32)
Calcium: 9 mg/dL (ref 8.9–10.3)
Chloride: 108 mmol/L (ref 98–111)
Creatinine, Ser: 1.05 mg/dL (ref 0.61–1.24)
GFR calc Af Amer: >60 mL/min (ref >60)
GFR calc non Af Amer: >60 mL/min (ref >60)
Glucose, Bld: 109 mg/dL — ABNORMAL HIGH (ref 70–99)
Potassium: 3.9 mmol/L (ref 3.5–5.1)
Sodium: 139 mmol/L (ref 135–145)

## 2019-02-16 LAB — SURGICAL PCR SCREEN
MRSA, PCR: NEGATIVE
Staphylococcus aureus: NEGATIVE

## 2019-02-16 LAB — PATHOLOGIST SMEAR REVIEW

## 2019-02-16 LAB — PROTIME-INR
INR: 1.1 (ref 0.8–1.2)
Prothrombin Time: 14 seconds (ref 11.4–15.2)

## 2019-02-16 LAB — MAGNESIUM: Magnesium: 1.9 mg/dL (ref 1.7–2.4)

## 2019-02-16 SURGERY — SHUNT INSERTION VENTRICULAR-PERITONEAL
Anesthesia: General | Laterality: Right

## 2019-02-16 MED ORDER — POTASSIUM CHLORIDE CRYS ER 20 MEQ PO TBCR
40.0000 meq | EXTENDED_RELEASE_TABLET | Freq: Two times a day (BID) | ORAL | Status: DC
  Administered 2019-02-16 – 2019-02-20 (×8): 40 meq via ORAL
  Filled 2019-02-16 (×9): qty 2

## 2019-02-16 MED ORDER — GADOBUTROL 1 MMOL/ML IV SOLN
8.0000 mL | Freq: Once | INTRAVENOUS | Status: AC | PRN
  Administered 2019-02-16: 8 mL via INTRAVENOUS

## 2019-02-16 NOTE — Consult Note (Signed)
Subjective: No new complaints. Endorses continued tinnitus and headaches. Denies vision changes at this time.   Antibiotics:  Anti-infectives (From admission, onward)   Start     Dose/Rate Route Frequency Ordered Stop   02/07/19 2230  atovaquone (MEPRON) 750 MG/5ML suspension 750 mg  Status:  Discontinued     750 mg Oral Daily with breakfast 02/07/19 1729 02/10/19 1543   02/07/19 2200  acyclovir (ZOVIRAX) tablet 400 mg     400 mg Oral 2 times daily 02/07/19 1729     02/07/19 1800  flucytosine (ANCOBON) capsule 2,500 mg     25 mg/kg  95.7 kg Oral Every 6 hours 02/07/19 1528     02/07/19 1800  amphotericin B liposome (AMBISOME) 250 mg in dextrose 5 % 500 mL IVPB     3 mg/kg  82.1 kg (Adjusted) 250 mL/hr over 120 Minutes Intravenous Every 24 hours 02/07/19 1528        Medications: Scheduled Meds: . acyclovir  400 mg Oral BID  . Chlorhexidine Gluconate Cloth  6 each Topical Daily  . dextrose  10 mL Intravenous Q24H  . dextrose  10 mL Intravenous Q24H  . flucytosine  25 mg/kg Oral Q6H  . loratadine  10 mg Oral Daily  . metoprolol tartrate  12.5 mg Oral BID  . potassium chloride  40 mEq Oral BID  . sodium chloride  500 mL Intravenous Q24H  . sodium chloride  500 mL Intravenous Q24H   Continuous Infusions: . sodium chloride 75 mL/hr at 02/16/19 0500  . amphotericin  B  Liposome (AMBISOME) ADULT IV 250 mg (02/15/19 1717)   PRN Meds:.acetaminophen, acetaminophen, diphenhydrAMINE **OR** diphenhydrAMINE, meperidine (DEMEROL) injection, sodium chloride flush   Objective:  Intake/Output Summary (Last 24 hours) at 02/16/2019 1540 Last data filed at 02/16/2019 0920 Gross per 24 hour  Intake 1699.5 ml  Output 0 ml  Net 1699.5 ml   Blood pressure 133/89, pulse (!) 102, temperature (!) 97.5 F (36.4 C), temperature source Oral, resp. rate 18, height 5\' 10"  (1.778 m), weight 92.4 kg, SpO2 95 %. Temp:  [97.5 F (36.4 C)-99.2 F (37.3 C)] 97.5 F (36.4 C) (10/07 0922)  Pulse Rate:  [83-102] 102 (10/07 0922) Resp:  [18] 18 (10/07 0922) BP: (129-136)/(82-95) 133/89 (10/07 0922) SpO2:  [95 %-99 %] 95 % (10/07 [id number])  Physical Exam: General: Alert and awake, oriented x3, not in any acute distress HEENT: anicteric sclera, EOMI CVS regular rate, normal  Chest: no wheezing, no respiratory distress Abdomen: soft non-distended Extremities: no edema or deformity noted bilaterally Skin: no rashes Neuro: nonfocal  CBC: CBC Latest Ref Rng & Units 02/16/2019 02/15/2019 02/14/2019  WBC 4.0 - 10.5 K/uL 10.5 11.7(H) 11.6(H)  Hemoglobin 13.0 - 17.0 g/dL 14.6 14.6 14.6  Hematocrit 39.0 - 52.0 % 43.4 43.9 43.9  Platelets 150 - 400 K/uL 142(L) 147(L) 177   BMET Recent Labs    02/15/19 0316 02/16/19 0359  NA 141 139  K 4.3 3.9  CL 110 108  CO2 21* 22  GLUCOSE 107* 109*  BUN 8 8  CREATININE 1.07 1.05  CALCIUM 9.1 9.0   Liver Panel  No results for input(s): PROT, ALBUMIN, AST, ALT, ALKPHOS, BILITOT, BILIDIR, IBILI in the last 72 hours.  Sedimentation Rate No results for input(s): ESRSEDRATE in the last 72 hours. C-Reactive Protein No results for input(s): CRP in the last 72 hours.  Micro Results: Recent Results (from the past 720 hour(s))  CSF culture  Status: Abnormal   Collection Time: 02/04/19  9:30 AM   Specimen: Lumbar Puncture; Cerebrospinal Fluid  Result Value Ref Range Status   MICRO NUMBER: [id number]  Final   SPECIMEN QUALITY: Adequate  Final   Source CEREBROSPINAL FLUID (CSF)  Final   STATUS: FINAL  Final   GRAM STAIN: (A)  Final    Many White blood cells seen No organisms seen Gram stain prepared by cytospin   ISOLATE 1: Cryptococcus neoformans (AA)  Final    Comment: Scant growth of Cryptococcus neoformans   MYCOBACTERIA, CULTURE, WITH FLUOROCHROME SMEAR     Status: None (Preliminary result)   Collection Time: 02/04/19  9:30 AM  Result Value Ref Range Status   MICRO NUMBER: [id number]  Preliminary   SPECIMEN QUALITY: Adequate   Preliminary   Source: CEREBROSPINAL FLUID (CSF)  Preliminary   STATUS: PRELIMINARY  Preliminary   SMEAR: No acid fast bacilli seen.  Preliminary   RESULT:   Preliminary    Culture results to follow. Final reports of negative cultures can be expected in approximately six weeks. Positive cultures are reported immediately.  Culture, blood (Routine x 2)     Status: None   Collection Time: 02/07/19  1:50 PM   Specimen: BLOOD LEFT ARM  Result Value Ref Range Status   Specimen Description BLOOD LEFT ARM  Final   Special Requests   Final    BOTTLES DRAWN AEROBIC AND ANAEROBIC Blood Culture results may not be optimal due to an excessive volume of blood received in culture bottles   Culture   Final    NO GROWTH 5 DAYS Performed at [facility], [place] [address], [place], [place] [address]    Report Status 02/12/2019 FINAL  Final  Culture, blood (Routine x 2)     Status: None   Collection Time: 02/07/19  3:06 PM   Specimen: BLOOD RIGHT ARM  Result Value Ref Range Status   Specimen Description BLOOD RIGHT ARM  Final   Special Requests   Final    BOTTLES DRAWN AEROBIC AND ANAEROBIC Blood Culture results may not be optimal due to an excessive volume of blood received in culture bottles   Culture   Final    NO GROWTH 5 DAYS Performed at [facility], [place] [address], [place], [place] [address]    Report Status 02/12/2019 FINAL  Final  SARS CORONAVIRUS 2 (TAT 6-24 HRS) Nasopharyngeal Nasopharyngeal Swab     Status: None   Collection Time: 02/07/19  3:54 PM   Specimen: Nasopharyngeal Swab  Result Value Ref Range Status   SARS Coronavirus 2 NEGATIVE NEGATIVE Final    Comment: (NOTE) SARS-CoV-2 target nucleic acids are NOT DETECTED. The SARS-CoV-2 RNA is generally detectable in upper and lower respiratory specimens during the acute phase of infection. Negative results do not preclude SARS-CoV-2 infection, do not rule out co-infections with other pathogens, and should not be used as the  sole basis for treatment or other patient management decisions. Negative results must be combined with clinical observations, patient history, and epidemiological information. The expected result is Negative. Fact Sheet for Patients: SugarRoll.be Fact Sheet for Healthcare Providers: https://www.woods-mathews.com/ This test is not yet approved or cleared by the [place] FDA and  has been authorized for detection and/or diagnosis of SARS-CoV-2 by FDA under an Emergency Use Authorization (EUA). This EUA will remain  in effect (meaning this test can be used) for the duration of the COVID-19 declaration under Section 56 4(b)(1) of the  Act, 21 U.S.C. section 360bbb-3(b)(1), unless the authorization is terminated or revoked sooner. Performed at [facility], [place] [address] [place], [place] [address]   CSF culture     Status: None   Collection Time: 02/09/19 11:36 AM   Specimen: PATH Cytology CSF; Cerebrospinal Fluid  Result Value Ref Range Status   Specimen Description CSF  Final   Special Requests NONE  Final   Gram Stain   Final    WBC PRESENT,BOTH PMN AND MONONUCLEAR NO ORGANISMS SEEN CYTOSPIN SMEAR    Culture   Final    NO GROWTH 3 DAYS Performed at [facility], [place] [address] [place], [place] [address]    Report Status 02/13/2019 FINAL  Final  CSF culture with Stat gram stain     Status: None   Collection Time: 02/11/19 11:32 AM   Specimen: CSF; Cerebrospinal Fluid  Result Value Ref Range Status   Specimen Description CSF  Final   Special Requests Immunocompromised  Final   Gram Stain   Final    WBC PRESENT, PREDOMINANTLY MONONUCLEAR NO ORGANISMS SEEN CYTOSPIN SMEAR    Culture   Final    NO GROWTH Performed at [facility], [place] [address] [place], [place] [address]    Report Status 02/14/2019 FINAL  Final  CSF culture     Status: None   Collection Time: 02/12/19 11:14 AM   Specimen: PATH Cytology  CSF; Cerebrospinal Fluid  Result Value Ref Range Status   Specimen Description CSF  Final   Special Requests NONE  Final   Gram Stain   Final    WBC PRESENT, PREDOMINANTLY MONONUCLEAR NO ORGANISMS SEEN CYTOSPIN SMEAR    Culture   Final    NO GROWTH 3 DAYS Performed at [facility], [place] [address] [place], [place] [address]    Report Status 02/15/2019 FINAL  Final  CSF culture with Stat gram stain     Status: None (Preliminary result)   Collection Time: 02/14/19  7:53 PM   Specimen: CSF; Cerebrospinal Fluid  Result Value Ref Range Status   Specimen Description CSF  Final   Special Requests Normal  Final   Gram Stain   Final    WBC PRESENT, PREDOMINANTLY PMN NO ORGANISMS SEEN CYTOSPIN SMEAR    Culture   Final    NO GROWTH 2 DAYS Performed at [facility], [place] [name] [place], [place] [address]    Report Status PENDING  Incomplete  Surgical pcr screen     Status: None   Collection Time: 02/16/19  8:46 AM   Specimen: Nasal Mucosa; Nasal Swab  Result Value Ref Range Status   MRSA, PCR NEGATIVE NEGATIVE Final   Staphylococcus aureus NEGATIVE NEGATIVE Final    Comment: (NOTE) The Xpert SA Assay (FDA approved for NASAL specimens in patients 42 years of age and older), is one component of a comprehensive surveillance program. It is not intended to diagnose infection nor to guide or monitor treatment. Performed at [facility], [place] [address] [place],  [address]     Studies/Results: Ct Head Wo Contrast  Result Date: 02/16/2019 CLINICAL DATA:  48 year old male with history of CLL and recent cryptococcal meningitis, elevated CSF opening pressure on multiple recent lumbar punctures. EXAM: CT HEAD WITHOUT CONTRAST TECHNIQUE: Contiguous axial images were obtained from the base of the skull through the vertex without intravenous contrast. COMPARISON:  Head CT 01/25/2019. Brain MRI 01/31/2019. FINDINGS: Brain: There is  a new roughly 3 centimeter area of  abnormal hypodensity in the anterior right frontal lobe seen on series 3, image 25 and coronal image 20. This might reflect vasogenic edema around a smaller isodense lesion on series 3, image 23. No significant mass effect. No associated hemorrhage. Gray-white matter differentiation elsewhere appears stable and within normal limits. No ventriculomegaly. No cortically based acute infarct identified. Vascular: No suspicious intracranial vascular hyperdensity. Skull: No acute osseous abnormality identified. Sinuses/Orbits: Visualized paranasal sinuses and mastoids are stable and well pneumatized. Other: No acute orbit or scalp soft tissue findings. There are bilateral level 5 lymph nodes mildly increased in size and number on series 4, image 6, up to 8 millimeter short axis. IMPRESSION: 1. New approximately 3 cm hypodense area in the anterior right frontal lobe. This is suspicious for edema and might reflect acute cerebritis in this setting. Recommend repeat Brain MRI without and with contrast to further characterize. 2. No other acute intracranial abnormality. 3. Mildly increased size and number of level 5 lymph nodes compatible with CLL. Electronically Signed   By: Genevie Ann M.D.   On: 02/16/2019 12:41    Assessment/Plan:  INTERVAL HISTORY:  Dr. Christella Noa planning for VP shunt placement today. Pt continues to endorse neurologic symptoms with headaches and tinnitus   Principal Problem:   Cryptococcal meningitis ([place]) Active Problems:   CLL (chronic lymphocytic leukemia) (H[facility])   Pulmonary nodule   Hypertension  David Whitehead is a 48 y.o. 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 Chemo currently on hold.  Currently on flucytosine and L-ampho with pre/post hydration per pharm. Electrolytes and kidney function stable with current replacement regimen.   Hopeful that placement of CSF diversion will improve pt's symptoms and allow for timed IV  antibiotic induction therapy to treat his cryptococcal meningitis.  Plan 1. Plan for VP shunt today 2. Continue antifungals with pre/post hydration 3. Electrolyte replacement and monitoring per pharmacy protocol 4. Continue to monitor for neurologic symptoms and signs of increasing ICP    LOS: 9 days   Ladona Horns 02/16/2019, 3:40 PM

## 2019-02-16 NOTE — Progress Notes (Signed)
Pharmacy Antibiotic Note  David Whitehead is a 48 y.o. 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 Pharmacy has been consulted for amphotericin B liposome dosing. Ambisome 3 mg/kg and flucytosine 25 mg/kg started in this patient with suspected cryptococcal meningitis. NS 520mL ordered to be given an hour before and an hour after the Ambisome infusion with D5 flushes administered inbetween. The pharmacy team will monitor the patients renal function, potassium and magnesium and make adjustments per protocol.  Potassium today is within normal limits at 3.9 down from 4.3, he only received 40 mEq of potassium yesterday. His order for 40 TID was changed to daily. I think he needs at lesast BID potassium so we will change his current order to BID. Magnesium is stable at 1.9. We will continue his therapy as is for now.  SCr is down slightly to 1.05 from 1.07 yesterday. Will watch renal function closely . May need to increase fluids to 1 L pre and post Ambisome dose.   Plan: Continue Ambisome 250 mg q24h (3 mg/kg) Continue Flucytosine 2,500 mg q6h (25 mg/kg) Change potassium from daily to 40 mEq BID  Continue to replace Mg and K as needed Monitor Scr and CBC   Height: 5\' 10"  (177.8 cm) Weight: 203 lb 12.8 oz (92.4 kg) IBW/kg (Calculated) : 73  Temp (24hrs), Avg:99 F (37.2 C), Min:98.6 F (37 C), Max:99.2 F (37.3 C)  Recent Labs  Lab 02/12/19 0336 02/13/19 0415 02/14/19 0356 02/15/19 0316 02/16/19 0359  WBC 12.5* 12.3* 11.6* 11.7* 10.5  CREATININE 0.84 0.82 1.06 1.07 1.05    Estimated Creatinine Clearance: 100.5 mL/min (by C-G formula based on SCr of 1.05 mg/dL).    Allergies  Allergen Reactions  . Bactrim [Sulfamethoxazole-Trimethoprim] Rash  . Latex Other (See Comments)    Took skin off when removed tape  . Amoxicillin Rash    Has patient had a PCN reaction causing immediate rash, facial/tongue/throat swelling, SOB or lightheadedness with hypotension: No Has  patient had a PCN reaction causing severe rash involving mucus membranes or skin necrosis: No Has patient had a PCN reaction that required hospitalization: No Has patient had a PCN reaction occurring within the last 10 years: No If all of the above answers are "NO", then may proceed with Cephalosporin use.     Antimicrobials this admission: Ambisome  9/28>> Flucytosine 9/28>>  Microbiology results: 9/28 BCx: No growth final  9/30; 10/2; 10/3 CSF culture: NGTD   Thank you for allowing pharmacy to be a part of this patient's care.  Nicoletta Dress, PharmD PGY2 Infectious Disease Pharmacy Resident  02/16/2019 8:43 AM

## 2019-02-16 NOTE — Progress Notes (Signed)
Patient ID: David Whitehead, male   DOB: 02/17/1971, 48 y.o.   MRN: [id number] BP 133/89 (BP Location: Right Arm)   Pulse (!) 102   Temp (!) 97.5 F (36.4 C) (Oral)   Resp 18   Ht 5\' 10"  (1.778 m)   Wt 92.4 kg   SpO2 95%   BMI 29.24 kg/m  New hypodensity identified on Head CT. MRI is ordered. Will hold vp shunt placement for now. Explained to [name].  Will follow up

## 2019-02-16 NOTE — Progress Notes (Signed)
PROGRESS NOTE    David Whitehead  [id number] DOB: 1970-07-09 DOA: 02/07/2019 PCP: [name], MD  Brief Narrative:48 year old male with history of CLL, hypertension, diabetes type 2 who presented with headache, diplopia.  He underwent work-up for headache as an outpatient.  He was seen by neurology and underwent lumbar puncture under fluoroscopy and was found to have elevated opening pressure of 55.  He noted immediate improvement in headache after lumbar puncture.  Cryptococcal antigen titers came out to be positive at 1: 64 and patient was sent to the emergency department.  ID consulted after admission.  Started on antibiotics. His symptoms of headache and diplopia have significantly  improved.  Hospital course  remarkable for persistent elevated opening pressure so he has been undergoing series of lumbar punctures.  Plan for another puncture lumbar  tomorrow.  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  Day 10.  ID recommends 4 weeks of IV therapy before transitioning to oral fluconazole.  Followed by ID.  Patient to go for VP shunt today.  He has had persistent elevated opening pressure.  He has had multiple LPs to normalize his CSF pressure.  CT head with out contrast on seven 2023 cm hypodense area in the anterior right frontal lobe suspicious for edema and might reflect acute cerebritis in the setting.  Recommending repeat brain MRI without and with contrast to better characterize.  No other acute intracranial abnormality.  Mildly increased size and number of lymph nodes compatible with CLL.  #2 history of CLL chemotherapy on hold.  #3 hypertension continue metoprolol.     Estimated body mass index is 29.24 kg/m as calculated from the following:   Height as of this encounter: 5\' 10"  (1.778  m).   Weight as of this encounter: 92.4 kg.  DVT prophylaxis: SCD  code Status: Full code  family Communication: None at bedside  disposition Plan: Pending clinical improvement patient is currently on IV antibiotics and going for a VP shunt today. Consultants:   Neurosurgery and ID  Procedures: Lumbar punctures  antimicrobials: Amphotericin B, acyclovir, flucytosine  Subjective: He is resting in bed complains of mild headache no nausea vomiting diarrhea.  Objective: Vitals:   02/15/19 1633 02/15/19 2142 02/16/19 0456 02/16/19 0922  BP: 129/82 136/88 (!) 132/95 133/89  Pulse: 83 87 89 (!) 102  Resp: 18 18 18 18   Temp: 99.2 F (37.3 C) 98.9 F (37.2 C) 98.6 F (37 C) (!) 97.5 F (36.4 C)  TempSrc: Oral Oral Oral Oral  SpO2: 99% 99% 95% 95%  Weight:      Height:        Intake/Output Summary (Last 24 hours) at 02/16/2019 1438 Last data filed at 02/16/2019 0920 Gross per 24 hour  Intake 2213.94 ml  Output 0 ml  Net 2213.94 ml   Filed Weights   02/12/19 0401 02/12/19 2100 02/15/19 0514  Weight: 93.1 kg 93.1 kg 92.4 kg    Examination:  General exam: Appears calm and comfortable  Respiratory system: Clear to auscultation. Respiratory effort normal. Cardiovascular system: S1 & S2 heard, RRR. No JVD, murmurs, rubs, gallops or clicks. No pedal edema. Gastrointestinal system: Abdomen is nondistended, soft and nontender. No organomegaly or masses felt. Normal bowel sounds heard. Central nervous system: Alert and oriented. No focal neurological deficits. Extremities: Symmetric 5 x 5 power.  Skin: No rashes, lesions or ulcers Psychiatry: Judgement and insight appear normal. Mood & affect appropriate.     Data Reviewed: I have personally reviewed following labs and imaging studies  CBC: Recent Labs  Lab 02/12/19 0336 02/13/19 0415 02/14/19 0356 02/15/19 0316 02/16/19 0359  WBC 12.5* 12.3* 11.6* 11.7* 10.5  HGB 14.4 14.6 14.6 14.6 14.6  HCT 43.9 44.2 43.9 43.9 43.4    MCV 82.8 83.4 82.2 83.3 83.0  PLT 201 185 177 147* [id number]*   Basic Metabolic Panel: Recent Labs  Lab 02/11/19 0422 02/12/19 0336 02/13/19 0415 02/14/19 0356 02/15/19 0316 02/16/19 0359  NA 139 140 139 141 141 139  K 3.4* 3.9 3.8 4.1 4.3 3.9  CL 109 110 109 111 110 108  CO2 23 22 22 22  21* 22  GLUCOSE 108* 105* 109* 108* 107* 109*  BUN 6 7 5* 8 8 8   CREATININE 0.99 0.84 0.82 1.06 1.07 1.05  CALCIUM 8.9 9.1 9.0 9.0 9.1 9.0  MG 2.2 2.3 2.3 2.1 2.2 1.9  PHOS 3.9 4.5  --   --   --   --    GFR: Estimated Creatinine Clearance: 100.5 mL/min (by C-G formula based on SCr of 1.05 mg/dL). Liver Function Tests: Recent Labs  Lab 02/11/19 0422 02/12/19 0336  ALBUMIN 3.1* 3.4*   No results for input(s): LIPASE, AMYLASE in the last 168 hours. No results for input(s): AMMONIA in the last 168 hours. Coagulation Profile: Recent Labs  Lab 02/16/19 0825  INR 1.1   Cardiac Enzymes: No results for input(s): CKTOTAL, CKMB, CKMBINDEX, TROPONINI in the last 168 hours. BNP (last 3 results) No results for input(s): PROBNP in the last 8760 hours. HbA1C: No results for input(s): HGBA1C in the last 72 hours. CBG: Recent Labs  Lab 02/12/19 1221  GLUCAP 98   Lipid Profile: No results for input(s): CHOL, HDL, LDLCALC, TRIG, CHOLHDL, LDLDIRECT in the last 72 hours. Thyroid Function Tests: No results for input(s): TSH, T4TOTAL, FREET4, T3FREE, THYROIDAB in the last 72 hours. Anemia Panel: No results for input(s): VITAMINB12, FOLATE, FERRITIN, TIBC, IRON, RETICCTPCT in the last 72 hours. Sepsis Labs: No results for input(s): PROCALCITON, LATICACIDVEN in the last 168 hours.  Recent Results (from the past 240 hour(s))  Culture, blood (Routine x 2)     Status: None   Collection Time: 02/07/19  1:50 PM   Specimen: BLOOD LEFT ARM  Result Value Ref Range Status   Specimen Description BLOOD LEFT ARM  Final   Special Requests   Final    BOTTLES DRAWN AEROBIC AND ANAEROBIC Blood Culture results  may not be optimal due to an excessive volume of blood received in culture bottles   Culture   Final    NO GROWTH 5 DAYS Performed at [facility], [place] [address], [place], [place] [address]    Report Status 02/12/2019 FINAL  Final  Culture, blood (Routine x 2)     Status: None   Collection Time: 02/07/19  3:06 PM   Specimen: BLOOD RIGHT ARM  Result Value Ref Range Status   Specimen Description BLOOD RIGHT ARM  Final   Special Requests   Final    BOTTLES DRAWN AEROBIC AND ANAEROBIC Blood Culture results may not be optimal due to an excessive volume of blood received in culture bottles   Culture   Final    NO GROWTH 5 DAYS Performed at [facility], [place] [address] [place], [place] [address]    Report Status  02/12/2019 FINAL  Final  SARS CORONAVIRUS 2 (TAT 6-24 HRS) Nasopharyngeal Nasopharyngeal Swab     Status: None   Collection Time: 02/07/19  3:54 PM   Specimen: Nasopharyngeal Swab  Result Value Ref Range Status   SARS Coronavirus 2 NEGATIVE NEGATIVE Final    Comment: (NOTE) SARS-CoV-2 target nucleic acids are NOT DETECTED. The SARS-CoV-2 RNA is generally detectable in upper and lower respiratory specimens during the acute phase of infection. Negative results do not preclude SARS-CoV-2 infection, do not rule out co-infections with other pathogens, and should not be used as the sole basis for treatment or other patient management decisions. Negative results must be combined with clinical observations, patient history, and epidemiological information. The expected result is Negative. Fact Sheet for Patients: SugarRoll.be Fact Sheet for Healthcare Providers: https://www.woods-[place].com/ This test is not yet approved or cleared by the [place] FDA and  has been authorized for detection and/or diagnosis of SARS-CoV-2 by FDA under an Emergency Use Authorization (EUA). This EUA will remain  in effect (meaning this test can  be used) for the duration of the COVID-19 declaration under Section 56 4(b)(1) of the Act, 21 U.S.C. section 360bbb-3(b)(1), unless the authorization is terminated or revoked sooner. Performed at [facility], [place] [address] [place], [place] [address]   CSF culture     Status: None   Collection Time: 02/09/19 11:36 AM   Specimen: PATH Cytology CSF; Cerebrospinal Fluid  Result Value Ref Range Status   Specimen Description CSF  Final   Special Requests NONE  Final   Gram Stain   Final    WBC PRESENT,BOTH PMN AND MONONUCLEAR NO ORGANISMS SEEN CYTOSPIN SMEAR    Culture   Final    NO GROWTH 3 DAYS Performed at [facility], [place] [address], [place], [place] [address]    Report Status 02/13/2019 FINAL  Final  CSF culture with Stat gram stain     Status: None   Collection Time: 02/11/19 11:32 AM   Specimen: CSF; Cerebrospinal Fluid  Result Value Ref Range Status   Specimen Description CSF  Final   Special Requests Immunocompromised  Final   Gram Stain   Final    WBC PRESENT, PREDOMINANTLY MONONUCLEAR NO ORGANISMS SEEN CYTOSPIN SMEAR    Culture   Final    NO GROWTH Performed at [facility], [place] [address] [place], [place] [address]    Report Status 02/14/2019 FINAL  Final  CSF culture     Status: None   Collection Time: 02/12/19 11:14 AM   Specimen: PATH Cytology CSF; Cerebrospinal Fluid  Result Value Ref Range Status   Specimen Description CSF  Final   Special Requests NONE  Final   Gram Stain   Final    WBC PRESENT, PREDOMINANTLY MONONUCLEAR NO ORGANISMS SEEN CYTOSPIN SMEAR    Culture   Final    NO GROWTH 3 DAYS Performed at [facility], [place] [address], [place], [place] [address]    Report Status 02/15/2019 FINAL  Final  CSF culture with Stat gram stain     Status: None (Preliminary result)   Collection Time: 02/14/19  7:53 PM   Specimen: CSF; Cerebrospinal Fluid  Result Value Ref Range Status   Specimen Description CSF  Final    Special Requests Normal  Final   Gram Stain   Final    WBC PRESENT, PREDOMINANTLY PMN NO ORGANISMS SEEN CYTOSPIN SMEAR    Culture   Final  NO GROWTH 2 DAYS Performed at [facility], [place] [address], [place], [place] [address]    Report Status PENDING  Incomplete  Surgical pcr screen     Status: None   Collection Time: 02/16/19  8:46 AM   Specimen: Nasal Mucosa; Nasal Swab  Result Value Ref Range Status   MRSA, PCR NEGATIVE NEGATIVE Final   Staphylococcus aureus NEGATIVE NEGATIVE Final    Comment: (NOTE) The Xpert SA Assay (FDA approved for NASAL specimens in patients 47 years of age and older), is one component of a comprehensive surveillance program. It is not intended to diagnose infection nor to guide or monitor treatment. Performed at [facility], [place] [address], [place], [place] [address]          Radiology Studies: Ct Head Wo Contrast  Result Date: 02/16/2019 CLINICAL DATA:  48 year old male with history of CLL and recent cryptococcal meningitis, elevated CSF opening pressure on multiple recent lumbar punctures. EXAM: CT HEAD WITHOUT CONTRAST TECHNIQUE: Contiguous axial images were obtained from the base of the skull through the vertex without intravenous contrast. COMPARISON:  Head CT 01/25/2019. Brain MRI 01/31/2019. FINDINGS: Brain: There is a new roughly 3 centimeter area of abnormal hypodensity in the anterior right frontal lobe seen on series 3, image 25 and coronal image 20. This might reflect vasogenic edema around a smaller isodense lesion on series 3, image 23. No significant mass effect. No associated hemorrhage. Gray-white matter differentiation elsewhere appears stable and within normal limits. No ventriculomegaly. No cortically based acute infarct identified. Vascular: No suspicious intracranial vascular hyperdensity. Skull: No acute osseous abnormality identified. Sinuses/Orbits: Visualized paranasal sinuses and mastoids are stable and well  pneumatized. Other: No acute orbit or scalp soft tissue findings. There are bilateral level 5 lymph nodes mildly increased in size and number on series 4, image 6, up to 8 millimeter short axis. IMPRESSION: 1. New approximately 3 cm hypodense area in the anterior right frontal lobe. This is suspicious for edema and might reflect acute cerebritis in this setting. Recommend repeat Brain MRI without and with contrast to further characterize. 2. No other acute intracranial abnormality. 3. Mildly increased size and number of level 5 lymph nodes compatible with CLL. Electronically Signed   By: Genevie Ann M.D.   On: 02/16/2019 12:41        Scheduled Meds:  acyclovir  400 mg Oral BID   Chlorhexidine Gluconate Cloth  6 each Topical Daily   dextrose  10 mL Intravenous Q24H   dextrose  10 mL Intravenous Q24H   flucytosine  25 mg/kg Oral Q6H   loratadine  10 mg Oral Daily   metoprolol tartrate  12.5 mg Oral BID   potassium chloride  40 mEq Oral BID   sodium chloride  500 mL Intravenous Q24H   sodium chloride  500 mL Intravenous Q24H   Continuous Infusions:  sodium chloride 75 mL/hr at 02/16/19 0500   amphotericin  B  Liposome (AMBISOME) ADULT IV 250 mg (02/15/19 1717)     LOS: 9 days      Georgette Shell, MD [facility]  If 7PM-7AM, please contact night-coverage [email or web address] Password [facility] 02/16/2019, 2:38 PM

## 2019-02-17 LAB — CBC
HCT: 43.5 % (ref 39.0–52.0)
Hemoglobin: 14.4 g/dL (ref 13.0–17.0)
MCH: 27.4 pg (ref 26.0–34.0)
MCHC: 33.1 g/dL (ref 30.0–36.0)
MCV: 82.9 fL (ref 80.0–100.0)
Platelets: 120 10*3/uL — ABNORMAL LOW (ref 150–400)
RBC: 5.25 MIL/uL (ref 4.22–5.81)
RDW: 13.7 % (ref 11.5–15.5)
WBC: 10.9 10*3/uL — ABNORMAL HIGH (ref 4.0–10.5)
nRBC: 0 % (ref 0.0–0.2)

## 2019-02-17 LAB — BASIC METABOLIC PANEL
Anion gap: 11 (ref 5–15)
BUN: 10 mg/dL (ref 6–20)
CO2: 23 mmol/L (ref 22–32)
Calcium: 9.1 mg/dL (ref 8.9–10.3)
Chloride: 107 mmol/L (ref 98–111)
Creatinine, Ser: 1.11 mg/dL (ref 0.61–1.24)
GFR calc Af Amer: >60 mL/min (ref >60)
GFR calc non Af Amer: >60 mL/min (ref >60)
Glucose, Bld: 98 mg/dL (ref 70–99)
Potassium: 4 mmol/L (ref 3.5–5.1)
Sodium: 141 mmol/L (ref 135–145)

## 2019-02-17 LAB — MAGNESIUM: Magnesium: 2 mg/dL (ref 1.7–2.4)

## 2019-02-17 MED ORDER — SODIUM CHLORIDE 0.9 % IV BOLUS FOR AMBISOME
1000.0000 mL | INTRAVENOUS | Status: DC
  Administered 2019-02-17 – 2019-03-03 (×15): 1000 mL via INTRAVENOUS

## 2019-02-17 NOTE — Progress Notes (Signed)
Patient ID: David Whitehead, male   DOB: 07-06-1970, 48 y.o.   MRN: [id number] BP 122/73 (BP Location: Right Arm)   Pulse 86   Temp 98.8 F (37.1 C) (Oral)   Resp 20   Ht 5\' 10"  (1.778 m)   Wt 92.4 kg   SpO2 97%   BMI 29.24 kg/m  Alert and oriented x 4, speech is clear, and fluent Scheduled for the OR tomorrow. Have spoken with Mr. Leys

## 2019-02-17 NOTE — Progress Notes (Signed)
David Whitehead for Infectious Disease  Date of Admission:  02/07/2019      Total days of antibiotics 7  Day 7 flucytosine and L-ampho            ASSESSMENT: David Whitehead is a 48 y.o. 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 His chemo-agent is currently on hold.   Continue flucytosine + L-ampho with pre-/post- hydration. May need to increase IVF to avoid nephrotoxic effect of L-ampho. Continue to monitor. K stable on current TID replacement. 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 Hopeful to get VP shunt in place soon or will need to arrange another LP; his tinnitus he describes to be increasing in frequency today. Fortunately no CSF growth from any of the taps he has had so far.   CLL - his WBC count has remained < 13K since admission with last dose of imbruvica 9/23.   I spoke with his wife, [name] and answered her questions today.    PLAN: 1. VP shunt timing per Dr. Christella Noa 2. Continue antifungals and pre/post hydration  3. Electrolyte replacement / monitoring per pharmacy protocol    Principal Problem:   Cryptococcal meningitis ([place]) Active Problems:   CLL (chronic lymphocytic leukemia) (HCC)   Pulmonary nodule   Hypertension   Brain lesion   . acyclovir  400 mg Oral BID  . Chlorhexidine Gluconate Cloth  6 each Topical Daily  . dextrose  10 mL Intravenous Q24H  . dextrose  10 mL Intravenous Q24H  . flucytosine  25 mg/kg Oral Q6H  . loratadine  10 mg Oral Daily  . metoprolol tartrate  12.5 mg Oral BID  . potassium chloride  40 mEq Oral BID  . sodium chloride  500 mL Intravenous Q24H  . sodium chloride  500 mL Intravenous Q24H    SUBJECTIVE: Still with tinnitus in left ear; feels that this is increasing in frequency. Otherwise no new complaints/concerns. Asking me to call his wife to update.   Interval additions - Mg 2.0 K , Cr. 1.11 (has been 0.8 -  1.06)   Review of Systems: Review of Systems  Constitutional: Negative for chills and fever.  HENT: Positive for tinnitus. Negative for ear pain and hearing loss.   Eyes: Negative for blurred vision, double vision, photophobia and pain.  Cardiovascular: Negative for chest pain and leg swelling.  Gastrointestinal: Negative for nausea.  Musculoskeletal: Negative for neck pain.  Neurological: Negative for dizziness, weakness and headaches.    Allergies  Allergen Reactions  . Bactrim [Sulfamethoxazole-Trimethoprim] Rash  . Latex Other (See Comments)    Took skin off when removed tape  . Amoxicillin Rash    Has patient had a PCN reaction causing immediate rash, facial/tongue/throat swelling, SOB or lightheadedness with hypotension: No Has patient had a PCN reaction causing severe rash involving mucus membranes or skin necrosis: No Has patient had a PCN reaction that required hospitalization: No Has patient had a PCN reaction occurring within the last 10 years: No If all of the above answers are "NO", then may proceed with Cephalosporin use.     OBJECTIVE: Vitals:   02/17/19 0503 02/17/19 0903 02/17/19 0918 02/17/19 1332  BP: 122/82 135/88 135/86 122/73  Pulse: 92 98 93 86  Resp: 18 18 18 20   Temp: 98.4 F (36.9 C)  98.4 F (36.9 C) 98.8 F (37.1 C)  TempSrc: Oral  Oral Oral  SpO2: 96% 97% 97% 97%  Weight:      Height:       Body mass index is 29.24 kg/m.  Physical Exam Constitutional:      Comments: Resting comfortably in bed watching TV  HENT:     Mouth/Throat:     Mouth: Mucous membranes are moist.     Pharynx: Oropharynx is clear.  Neck:     Musculoskeletal: No neck rigidity or muscular tenderness.  Cardiovascular:     Rate and Rhythm: Normal rate and regular rhythm.  Pulmonary:     Effort: Pulmonary effort is normal.     Breath sounds: Normal breath sounds.  Abdominal:     General: There is no distension.     Tenderness: There is no abdominal tenderness.   Musculoskeletal:     Comments: LUE PICC In place with clean/dry dressing  Skin:    General: Skin is warm and dry.     Capillary Refill: Capillary refill takes less than 2 seconds.  Neurological:     Mental Status: He is alert and oriented to person, place, and time.     Lab Results Lab Results  Component Value Date   WBC 10.9 (H) 02/17/2019   HGB 14.4 02/17/2019   HCT 43.5 02/17/2019   MCV 82.9 02/17/2019   PLT 120 (L) 02/17/2019    Lab Results  Component Value Date   CREATININE 1.11 02/17/2019   BUN 10 02/17/2019   NA 141 02/17/2019   K 4.0 02/17/2019   CL 107 02/17/2019   CO2 23 02/17/2019    Lab Results  Component Value Date   ALT 22 02/09/2019   AST 12 (L) 02/09/2019   ALKPHOS 30 (L) 02/09/2019   BILITOT 0.6 02/09/2019     Microbiology: Recent Results (from the past 240 hour(s))  Culture, blood (Routine x 2)     Status: None   Collection Time: 02/07/19  3:06 PM   Specimen: BLOOD RIGHT ARM  Result Value Ref Range Status   Specimen Description BLOOD RIGHT ARM  Final   Special Requests   Final    BOTTLES DRAWN AEROBIC AND ANAEROBIC Blood Culture results may not be optimal due to an excessive volume of blood received in culture bottles   Culture   Final    NO GROWTH 5 DAYS Performed at [facility], [place] [address], [place], [place] [address]    Report Status 02/12/2019 FINAL  Final  SARS CORONAVIRUS 2 (TAT 6-24 HRS) Nasopharyngeal Nasopharyngeal Swab     Status: None   Collection Time: 02/07/19  3:54 PM   Specimen: Nasopharyngeal Swab  Result Value Ref Range Status   SARS Coronavirus 2 NEGATIVE NEGATIVE Final    Comment: (NOTE) SARS-CoV-2 target nucleic acids are NOT DETECTED. The SARS-CoV-2 RNA is generally detectable in upper and lower respiratory specimens during the acute phase of infection. Negative results do not preclude SARS-CoV-2 infection, do not rule out co-infections with other pathogens, and should not be used as the sole basis for  treatment or other patient management decisions. Negative results must be combined with clinical observations, patient history, and epidemiological information. The expected result is Negative. Fact Sheet for Patients: SugarRoll.be Fact Sheet for Healthcare Providers: https://www.woods-mathews.com/ This test is not yet approved or cleared by the [place] FDA and  has been authorized for detection and/or diagnosis of SARS-CoV-2 by FDA under an Emergency Use Authorization (EUA). This EUA will remain  in effect (meaning this test can be used) for  the duration of the COVID-19 declaration under Section 56 4(b)(1) of the Act, 21 U.S.C. section 360bbb-3(b)(1), unless the authorization is terminated or revoked sooner. Performed at [facility], [place] [address], [place], [place] [address]   CSF culture     Status: None   Collection Time: 02/09/19 11:36 AM   Specimen: PATH Cytology CSF; Cerebrospinal Fluid  Result Value Ref Range Status   Specimen Description CSF  Final   Special Requests NONE  Final   Gram Stain   Final    WBC PRESENT,BOTH PMN AND MONONUCLEAR NO ORGANISMS SEEN CYTOSPIN SMEAR    Culture   Final    NO GROWTH 3 DAYS Performed at [facility], [place] [address], [place], [place] [address]    Report Status 02/13/2019 FINAL  Final  CSF culture with Stat gram stain     Status: None   Collection Time: 02/11/19 11:32 AM   Specimen: CSF; Cerebrospinal Fluid  Result Value Ref Range Status   Specimen Description CSF  Final   Special Requests Immunocompromised  Final   Gram Stain   Final    WBC PRESENT, PREDOMINANTLY MONONUCLEAR NO ORGANISMS SEEN CYTOSPIN SMEAR    Culture   Final    NO GROWTH Performed at [facility], [place] [address] [place], [place] [address]    Report Status 02/14/2019 FINAL  Final  CSF culture     Status: None   Collection Time: 02/12/19 11:14 AM   Specimen: PATH Cytology CSF; Cerebrospinal  Fluid  Result Value Ref Range Status   Specimen Description CSF  Final   Special Requests NONE  Final   Gram Stain   Final    WBC PRESENT, PREDOMINANTLY MONONUCLEAR NO ORGANISMS SEEN CYTOSPIN SMEAR    Culture   Final    NO GROWTH 3 DAYS Performed at [facility], [place] [address] [place], [place] [address]    Report Status 02/15/2019 FINAL  Final  Fungus Culture With Stain     Status: None (Preliminary result)   Collection Time: 02/14/19  7:29 PM  Result Value Ref Range Status   Fungus Stain Final report  Final    Comment: (NOTE) Performed At: [facility] [place], [place] [id number] Rush Farmer MD [id number]    Fungus (Mycology) Culture PENDING  Incomplete   Fungal Source PENDING  Incomplete  Fungus Culture Result     Status: None   Collection Time: 02/14/19  7:29 PM  Result Value Ref Range Status   Result 1 Comment  Final    Comment: (NOTE) KOH/Calcofluor preparation:  no fungus observed. Performed At: [facility] [place], [place] [id number] Rush Farmer MD [id number]   CSF culture with Stat gram stain     Status: None (Preliminary result)   Collection Time: 02/14/19  7:53 PM   Specimen: CSF; Cerebrospinal Fluid  Result Value Ref Range Status   Specimen Description CSF  Final   Special Requests Normal  Final   Gram Stain   Final    WBC PRESENT, PREDOMINANTLY PMN NO ORGANISMS SEEN CYTOSPIN SMEAR    Culture   Final    NO GROWTH 3 DAYS Performed at [facility], [address], [place], [place] [address]    Report Status PENDING  Incomplete  Surgical pcr screen     Status: None   Collection Time: 02/16/19  8:46 AM   Specimen: Nasal Mucosa; Nasal Swab  Result Value Ref Range  Status   MRSA, PCR NEGATIVE NEGATIVE Final   Staphylococcus aureus NEGATIVE NEGATIVE Final    Comment: (NOTE) The Xpert SA Assay (FDA approved for NASAL specimens in patients 51 years of age and older), is one component  of a comprehensive surveillance program. It is not intended to diagnose infection nor to guide or monitor treatment. Performed at [facility], [place] [address] [place],  [address]     Janene Madeira, MSN, NP-C [place] [email or web address] Pager: [phone] Office: [place]: [phone]   02/17/2019  2:01 PM

## 2019-02-17 NOTE — Progress Notes (Signed)
PROGRESS NOTE    David Whitehead  [id number] DOB: November 21, 1970 DOA: 02/07/2019 PCP: [name], MD    Brief Narrative:48 year old male with history of CLL, hypertension, diabetes type 2 who presented with headache, diplopia. He underwent work-up for headache as an outpatient. He was seen by neurology and underwent lumbar puncture under fluoroscopy and was found to have elevated opening pressure of 55. He noted immediate improvement in headache after lumbar puncture. Cryptococcal antigen titers came out to be positive at 1: 64 and patient was sent to the emergency department. ID consulted after admission. Started on antibiotics. His symptoms of headache and diplopia have significantly improved. Hospital course remarkable for persistent elevated opening pressure so he has been undergoing series of lumbar punctures. Plan for another puncture lumbar tomorrow.  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   ID recommends 4 weeks of IV therapy before transitioning to oral fluconazole.  Followed by ID.  Patient to go for VP shunt soon.  He has had persistent elevated opening pressure.  He has had multiple LPs to normalize his CSF pressure.  CT head with out contrast  hypodense area in the anterior right frontal lobe suspicious for edema and might reflect acute cerebritis in the setting.  MRI 02/16/2019 -abnormal leptomeningeal enhancement which is new and consistent with meningitis.  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  No formed abscess.  Following for monitoring of  renal functions potassium and magnesium while on amphotericin. Iv  fluids have been increased.  #2 history of CLL chemotherapy on hold.  #3 hypertension continue metoprolol.  BP stable.    Estimated body mass index is 29.24 kg/m as calculated from the following:   Height as of this encounter: 5\' 10"  (1.778 m).   Weight as of this encounter: 92.4 kg.  DVT prophylaxis: SCD  code Status: Full code  family Communication: None at bedside  disposition Plan: Pending clinical improvement patient is currently on IV antibiotics and going for a VP shunt hopefully soon Consultants:   Neurosurgery and ID  Procedures: Lumbar punctures  antimicrobials: Amphotericin B, acyclovir, flucytosine  Subjective:  Resting in bed complains of mild headache and tinnitus Objective: Vitals:   02/17/19 0503 02/17/19 0903 02/17/19 0918 02/17/19 1332  BP: 122/82 135/88 135/86 122/73  Pulse: 92 98 93 86  Resp: 18 18 18 20   Temp: 98.4 F (36.9 C)  98.4 F (36.9 C) 98.8 F (37.1 C)  TempSrc: Oral  Oral Oral  SpO2: 96% 97% 97% 97%  Weight:      Height:        Intake/Output Summary (Last 24 hours) at 02/17/2019 1438 Last data filed at 02/17/2019 1300 Gross per 24 hour  Intake 1765.76 ml  Output 0 ml  Net 1765.76 ml   Filed Weights   02/12/19 0401 02/12/19 2100 02/15/19 0514  Weight: 93.1 kg 93.1 kg 92.4 kg    Examination:  General exam: Appears calm and comfortable  Respiratory system: Clear to auscultation. Respiratory effort normal. Cardiovascular system: S1 & S2 heard, RRR. No JVD, murmurs, rubs, gallops or clicks. No pedal edema. Gastrointestinal system: Abdomen  is nondistended, soft and nontender. No organomegaly or masses felt. Normal bowel sounds heard. Central nervous system: Alert and oriented. No focal neurological deficits. Extremities: Symmetric 5 x 5 power. Skin: No rashes, lesions or ulcers Psychiatry: Judgement and insight appear normal. Mood & affect appropriate.      Data Reviewed: I have personally reviewed following labs and imaging studies  CBC: Recent Labs  Lab 02/13/19 0415 02/14/19 0356 02/15/19 0316 02/16/19 0359 02/17/19 0328  WBC 12.3* 11.6* 11.7* 10.5 10.9*  HGB 14.6 14.6 14.6 14.6 14.4  HCT 44.2 43.9 43.9 43.4 43.5  MCV 83.4 82.2 83.3 83.0 82.9  PLT 185 177 147* 142* [id number]*   Basic Metabolic Panel: Recent Labs  Lab 02/11/19 0422 02/12/19 0336 02/13/19 0415 02/14/19 0356 02/15/19 0316 02/16/19 0359 02/17/19 0328  NA 139 140 139 141 141 139 141  K 3.4* 3.9 3.8 4.1 4.3 3.9 4.0  CL 109 110 109 111 110 108 107  CO2 23 22 22 22  21* 22 23  GLUCOSE 108* 105* 109* 108* 107* 109* 98  BUN 6 7 5* 8 8 8 10   CREATININE 0.99 0.84 0.82 1.06 1.07 1.05 1.11  CALCIUM 8.9 9.1 9.0 9.0 9.1 9.0 9.1  MG 2.2 2.3 2.3 2.1 2.2 1.9 2.0  PHOS 3.9 4.5  --   --   --   --   --    GFR: Estimated Creatinine Clearance: 95 mL/min (by C-G formula based on SCr of 1.11 mg/dL). Liver Function Tests: Recent Labs  Lab 02/11/19 0422 02/12/19 0336  ALBUMIN 3.1* 3.4*   No results for input(s): LIPASE, AMYLASE in the last 168 hours. No results for input(s): AMMONIA in the last 168 hours. Coagulation Profile: Recent Labs  Lab 02/16/19 0825  INR 1.1   Cardiac Enzymes: No results for input(s): CKTOTAL, CKMB, CKMBINDEX, TROPONINI in the last 168 hours. BNP (last 3 results) No results for input(s): PROBNP in the last 8760 hours. HbA1C: No results for input(s): HGBA1C in the last 72 hours. CBG: Recent Labs  Lab 02/12/19 1221  GLUCAP 98   Lipid Profile: No results for input(s): CHOL, HDL, LDLCALC, TRIG, CHOLHDL, LDLDIRECT in the last 72 hours. Thyroid Function Tests: No results for input(s): TSH, T4TOTAL, FREET4, T3FREE, THYROIDAB in the last 72 hours. Anemia Panel: No results for input(s): VITAMINB12, FOLATE, FERRITIN, TIBC, IRON, RETICCTPCT in the last 72 hours. Sepsis Labs: No results for input(s): PROCALCITON, LATICACIDVEN in the last 168  hours.  Recent Results (from the past 240 hour(s))  Culture, blood (Routine x 2)     Status: None   Collection Time: 02/07/19  3:06 PM   Specimen: BLOOD RIGHT ARM  Result Value Ref Range Status   Specimen Description BLOOD RIGHT ARM  Final   Special Requests   Final    BOTTLES DRAWN AEROBIC AND ANAEROBIC Blood Culture results may not be optimal due to an excessive volume of blood received in culture bottles   Culture   Final    NO GROWTH 5 DAYS Performed at [facility], [place] [address], [place],  [address]    Report Status 02/12/2019 FINAL  Final  SARS CORONAVIRUS 2 (TAT 6-24 HRS) Nasopharyngeal Nasopharyngeal Swab     Status: None   Collection Time: 02/07/19  3:54 PM   Specimen: Nasopharyngeal Swab  Result Value Ref Range Status   SARS Coronavirus 2 NEGATIVE NEGATIVE Final    Comment: (NOTE) SARS-CoV-2 target nucleic acids are NOT DETECTED. The SARS-CoV-2 RNA is generally detectable in  upper and lower respiratory specimens during the acute phase of infection. Negative results do not preclude SARS-CoV-2 infection, do not rule out co-infections with other pathogens, and should not be used as the sole basis for treatment or other patient management decisions. Negative results must be combined with clinical observations, patient history, and epidemiological information. The expected result is Negative. Fact Sheet for Patients: SugarRoll.be Fact Sheet for Healthcare Providers: https://www.[facility] [facility].com/ This test is not yet approved or cleared by the [place] FDA and  has been authorized for detection and/or diagnosis of SARS-CoV-2 by FDA under an Emergency Use Authorization (EUA). This EUA will remain  in effect (meaning this test can be used) for the duration of the COVID-19 declaration under Section 56 4(b)(1) of the Act, 21 U.S.C. section 360bbb-3(b)(1), unless the authorization is terminated or revoked  sooner. Performed at [facility], [place] [address] [place], [place] [address]   CSF culture     Status: None   Collection Time: 02/09/19 11:36 AM   Specimen: PATH Cytology CSF; Cerebrospinal Fluid  Result Value Ref Range Status   Specimen Description CSF  Final   Special Requests NONE  Final   Gram Stain   Final    WBC PRESENT,BOTH PMN AND MONONUCLEAR NO ORGANISMS SEEN CYTOSPIN SMEAR    Culture   Final    NO GROWTH 3 DAYS Performed at [facility], [place] [address], [place], [place] [address]    Report Status 02/13/2019 FINAL  Final  CSF culture with Stat gram stain     Status: None   Collection Time: 02/11/19 11:32 AM   Specimen: CSF; Cerebrospinal Fluid  Result Value Ref Range Status   Specimen Description CSF  Final   Special Requests Immunocompromised  Final   Gram Stain   Final    WBC PRESENT, PREDOMINANTLY MONONUCLEAR NO ORGANISMS SEEN CYTOSPIN SMEAR    Culture   Final    NO GROWTH Performed at [facility], [place] [address] [place], [place] [address]    Report Status 02/14/2019 FINAL  Final  CSF culture     Status: None   Collection Time: 02/12/19 11:14 AM   Specimen: PATH Cytology CSF; Cerebrospinal Fluid  Result Value Ref Range Status   Specimen Description CSF  Final   Special Requests NONE  Final   Gram Stain   Final    WBC PRESENT, PREDOMINANTLY MONONUCLEAR NO ORGANISMS SEEN CYTOSPIN SMEAR    Culture   Final    NO GROWTH 3 DAYS Performed at [facility], [place] [address] [place],  [address]    Report Status 02/15/2019 FINAL  Final  Fungus Culture With Stain     Status: None (Preliminary result)   Collection Time: 02/14/19  7:29 PM  Result Value Ref Range Status   Fungus Stain Final report  Final    Comment: (NOTE) Performed At: [facility] [place], [place] [id number] Rush Farmer MD [id number]    Fungus (Mycology) Culture PENDING  Incomplete   Fungal Source PENDING  Incomplete  Fungus  Culture Result     Status: None   Collection Time: 02/14/19  7:29 PM  Result Value Ref Range Status   Result 1 Comment  Final    Comment: (NOTE) KOH/Calcofluor preparation:  no fungus observed. Performed At: [facility] [place], [place] [id number] Rush Farmer MD [id number]   CSF culture with Stat gram stain     Status: None (Preliminary result)  Collection Time: 02/14/19  7:53 PM   Specimen: CSF; Cerebrospinal Fluid  Result Value Ref Range Status   Specimen Description CSF  Final   Special Requests Normal  Final   Gram Stain   Final    WBC PRESENT, PREDOMINANTLY PMN NO ORGANISMS SEEN CYTOSPIN SMEAR    Culture   Final    NO GROWTH 3 DAYS Performed at [facility] [address] [place], [place] [address]    Report Status PENDING  Incomplete  Surgical pcr screen     Status: None   Collection Time: 02/16/19  8:46 AM   Specimen: Nasal Mucosa; Nasal Swab  Result Value Ref Range Status   MRSA, PCR NEGATIVE NEGATIVE Final   Staphylococcus aureus NEGATIVE NEGATIVE Final    Comment: (NOTE) The Xpert SA Assay (FDA approved for NASAL specimens in patients 62 years of age and older), is one component of a comprehensive surveillance program. It is not intended to diagnose infection nor to guide or monitor treatment. Performed at [facility], [place] [address], [place], [place] [address]          Radiology Studies: Ct Head Wo Contrast  Result Date: 02/16/2019 CLINICAL DATA:  47 year old male with history of CLL and recent cryptococcal meningitis, elevated CSF opening pressure on multiple recent lumbar punctures. EXAM: CT HEAD WITHOUT CONTRAST TECHNIQUE: Contiguous axial images were obtained from the base of the skull through the vertex without intravenous contrast. COMPARISON:  Head CT 01/25/2019. Brain MRI 01/31/2019. FINDINGS: Brain: There is a new roughly 3 centimeter area of abnormal hypodensity in the anterior right frontal lobe seen  on series 3, image 25 and coronal image 20. This might reflect vasogenic edema around a smaller isodense lesion on series 3, image 23. No significant mass effect. No associated hemorrhage. Gray-white matter differentiation elsewhere appears stable and within normal limits. No ventriculomegaly. No cortically based acute infarct identified. Vascular: No suspicious intracranial vascular hyperdensity. Skull: No acute osseous abnormality identified. Sinuses/Orbits: Visualized paranasal sinuses and mastoids are stable and well pneumatized. Other: No acute orbit or scalp soft tissue findings. There are bilateral level 5 lymph nodes mildly increased in size and number on series 4, image 6, up to 8 millimeter short axis. IMPRESSION: 1. New approximately 3 cm hypodense area in the anterior right frontal lobe. This is suspicious for edema and might reflect acute cerebritis in this setting. Recommend repeat Brain MRI without and with contrast to further characterize. 2. No other acute intracranial abnormality. 3. Mildly increased size and number of level 5 lymph nodes compatible with CLL. Electronically Signed   By: Genevie Ann M.D.   On: 02/16/2019 12:41   Mr Jeri Cos [id number] Contrast  Result Date: 02/16/2019 CLINICAL DATA:  Meningitis due to other and unspecified causes. EXAM: MRI HEAD WITHOUT AND WITH CONTRAST TECHNIQUE: Multiplanar, multiecho pulse sequences of the brain and surrounding structures were obtained without and with intravenous contrast. CONTRAST:  24mL GADAVIST GADOBUTROL 1 MMOL/ML IV SOLN COMPARISON:  Head CT 02/16/2019, brain MRI 01/31/2019 FINDINGS: Brain: New from prior MRI 01/31/2019, there is significant supratentorial and infratentorial leptomeningeal enhancement, most prominent along the anterosuperior interhemispheric fissure, and consistent with the provided history of meningitis. Also new from prior MRI, within the paramedian anterosuperior right frontal lobe there is prominent cortical/subcortical edema  which extends into the anterior body/genu of the corpus callosum. Associated irregular foci of restricted diffusion within the region of right frontal lobe parenchymal signal abnormality. The constellation of findings are consistent  with cerebritis. No formed parenchymal abscess on the current examination. Of note, abnormal leptomeningeal enhancement extends into the left internal auditory canal (series 10, image 13). No midline shift, hydrocephalus or extra-axial fluid collection. No chronic intracranial blood products. Additional mild scattered foci of T2/FLAIR hyperintensity within the cerebral white matter are nonspecific, but may reflect early chronic small vessel ischemic disease. Vascular: Flow voids maintained within the proximal large arterial vessels. Expected enhancement within the dural venous sinuses. Skull and upper cervical spine: No focal marrow lesion. Sinuses/Orbits: Visualized orbits demonstrate no acute abnormality. Mild ethmoid sinus mucosal thickening. No significant mastoid effusion. Other: Redemonstrated prominent lymph nodes within the upper neck consistent with the provided history of CLL. These results will be called to the ordering clinician or representative by the Radiologist Assistant, and communication documented in the PACS or zVision Dashboard. IMPRESSION: 1. Abnormal leptomeningeal enhancement new from prior MRI 01/31/2019 and consistent with the provided history of meningitis. Enhancement is most prominent along the anterosuperior interhemispheric fissure. Also of note, abnormal leptomeningeal enhancement extends into the left IAC. 2. 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 Associated irregular restricted diffusion within the region of right frontal lobe parenchymal signal abnormality. No formed abscess on the current study. 3. No midline shift, hydrocephalus or extra-axial  fluid collection. Electronically Signed   By: Kellie Simmering   On: 02/16/2019 23:24        Scheduled Meds:  acyclovir  400 mg Oral BID   Chlorhexidine Gluconate Cloth  6 each Topical Daily   dextrose  10 mL Intravenous Q24H   dextrose  10 mL Intravenous Q24H   flucytosine  25 mg/kg Oral Q6H   loratadine  10 mg Oral Daily   metoprolol tartrate  12.5 mg Oral BID   potassium chloride  40 mEq Oral BID   sodium chloride  500 mL Intravenous Q24H   sodium chloride  500 mL Intravenous Q24H   Continuous Infusions:  sodium chloride 75 mL/hr at 02/17/19 1150   amphotericin  B  Liposome (AMBISOME) ADULT IV 250 mg (02/16/19 1710)     LOS: 10 days     Georgette Shell, MD [facility]  If 7PM-7AM, please contact night-coverage [email or web address] Password [facility] 02/17/2019, 2:38 PM

## 2019-02-17 NOTE — Progress Notes (Signed)
Pharmacy Antibiotic Note  David Whitehead is a 48 y.o. 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 Pharmacy has been consulted for amphotericin B liposome dosing. Ambisome 3 mg/kg and flucytosine 25 mg/kg started in this patient with suspected cryptococcal meningitis. NS 569mL ordered to be given an hour before and an hour after the Ambisome infusion with D5 flushes administered inbetween. The pharmacy team will monitor the patients renal function, potassium and magnesium and make adjustments per protocol.  Potassium today is within normal limits at 4.0 up from 3.9. He is on K 40 mEq BID. Magnesium is stable at 2.0. We will continue his therapy as is for now.  SCr is up slightly to 1.11 from 1.05 yesterday. Will watch renal function closely and increase fluids to 1 L pre Ambisome dose.   Plan: Continue Ambisome 250 mg q24h (3 mg/kg) Continue Flucytosine 2,500 mg q6h (25 mg/kg) Continue potassium 40 mEq BID  Continue to replace Mg and K as needed Increase pre-dose fluid from 500 mL to 1000 mL Monitor Scr and CBC   Height: 5\' 10"  (177.8 cm) Weight: 203 lb 12.8 oz (92.4 kg) IBW/kg (Calculated) : 73  Temp (24hrs), Avg:98.5 F (36.9 C), Min:98.4 F (36.9 C), Max:98.8 F (37.1 C)  Recent Labs  Lab 02/13/19 0415 02/14/19 0356 02/15/19 0316 02/16/19 0359 02/17/19 0328  WBC 12.3* 11.6* 11.7* 10.5 10.9*  CREATININE 0.82 1.06 1.07 1.05 1.11    Estimated Creatinine Clearance: 95 mL/min (by C-G formula based on SCr of 1.11 mg/dL).    Allergies  Allergen Reactions  . Bactrim [Sulfamethoxazole-Trimethoprim] Rash  . Latex Other (See Comments)    Took skin off when removed tape  . Amoxicillin Rash    Has patient had a PCN reaction causing immediate rash, facial/tongue/throat swelling, SOB or lightheadedness with hypotension: No Has patient had a PCN reaction causing severe rash involving mucus membranes or skin necrosis: No Has patient had a PCN reaction that required  hospitalization: No Has patient had a PCN reaction occurring within the last 10 years: No If all of the above answers are "NO", then may proceed with Cephalosporin use.     Antimicrobials this admission: Ambisome  9/28>> Flucytosine 9/28>>  Microbiology results: 9/28 BCx: No growth final  9/30; 10/2; 10/3 CSF culture: NGTD   Thank you for allowing pharmacy to be a part of this patient's care.  Nicoletta Dress, PharmD PGY2 Infectious Disease Pharmacy Resident  02/17/2019 2:26 PM

## 2019-02-17 NOTE — Plan of Care (Signed)
  Problem: Health Behavior/Discharge Planning: Goal: Ability to manage health-related needs will improve Outcome: Progressing   

## 2019-02-18 ENCOUNTER — Inpatient Hospital Stay (HOSPITAL_COMMUNITY): Payer: BC Managed Care – PPO | Admitting: Anesthesiology

## 2019-02-18 ENCOUNTER — Inpatient Hospital Stay (HOSPITAL_COMMUNITY)
Admission: EM | Disposition: A | Discharge status: Home / Self Care | Payer: Self-pay | Source: Home / Self Care | Attending: Internal Medicine

## 2019-02-18 ENCOUNTER — Encounter (HOSPITAL_COMMUNITY): Payer: Self-pay | Admitting: Certified Registered Nurse Anesthetist

## 2019-02-18 ENCOUNTER — Inpatient Hospital Stay (HOSPITAL_COMMUNITY): Payer: BC Managed Care – PPO

## 2019-02-18 ENCOUNTER — Telehealth: Payer: Self-pay | Admitting: Family Medicine

## 2019-02-18 DIAGNOSIS — B451 Cerebral cryptococcosis: Secondary | ICD-10-CM | POA: Diagnosis present

## 2019-02-18 HISTORY — PX: VENTRICULOPERITONEAL SHUNT: SHX204

## 2019-02-18 LAB — BASIC METABOLIC PANEL
Anion gap: 10 (ref 5–15)
BUN: 7 mg/dL (ref 6–20)
CO2: 22 mmol/L (ref 22–32)
Calcium: 9 mg/dL (ref 8.9–10.3)
Chloride: 110 mmol/L (ref 98–111)
Creatinine, Ser: 1.12 mg/dL (ref 0.61–1.24)
GFR calc Af Amer: >60 mL/min (ref >60)
GFR calc non Af Amer: >60 mL/min (ref >60)
Glucose, Bld: 104 mg/dL — ABNORMAL HIGH (ref 70–99)
Potassium: 4.1 mmol/L (ref 3.5–5.1)
Sodium: 142 mmol/L (ref 135–145)

## 2019-02-18 LAB — URINALYSIS, ROUTINE W REFLEX MICROSCOPIC
Bilirubin Urine: NEGATIVE
Glucose, UA: NEGATIVE mg/dL
Hgb urine dipstick: NEGATIVE
Ketones, ur: NEGATIVE mg/dL
Leukocytes,Ua: NEGATIVE
Nitrite: NEGATIVE
Protein, ur: NEGATIVE mg/dL
Specific Gravity, Urine: 1.003 — ABNORMAL LOW (ref 1.005–1.030)
pH: 6 (ref 5.0–8.0)

## 2019-02-18 LAB — CBC
HCT: 41.8 % (ref 39.0–52.0)
Hemoglobin: 14.2 g/dL (ref 13.0–17.0)
MCH: 28.2 pg (ref 26.0–34.0)
MCHC: 34 g/dL (ref 30.0–36.0)
MCV: 82.9 fL (ref 80.0–100.0)
Platelets: 105 K/uL — ABNORMAL LOW (ref 150–400)
RBC: 5.04 MIL/uL (ref 4.22–5.81)
RDW: 13.6 % (ref 11.5–15.5)
WBC: 9.2 K/uL (ref 4.0–10.5)
nRBC: 0 % (ref 0.0–0.2)

## 2019-02-18 LAB — CSF CULTURE W GRAM STAIN
Culture: NO GROWTH
Special Requests: NORMAL

## 2019-02-18 LAB — GLUCOSE, CAPILLARY: Glucose-Capillary: 118 mg/dL — ABNORMAL HIGH (ref 70–99)

## 2019-02-18 LAB — MAGNESIUM: Magnesium: 1.8 mg/dL (ref 1.7–2.4)

## 2019-02-18 LAB — CRYPTOCOCCAL ANTIGEN, CSF: Crypto Ag: NEGATIVE

## 2019-02-18 SURGERY — SHUNT INSERTION VENTRICULAR-PERITONEAL
Anesthesia: General | Site: Head | Laterality: Right

## 2019-02-18 MED ORDER — NEOSTIGMINE METHYLSULFATE 10 MG/10ML IV SOLN
INTRAVENOUS | Status: DC | PRN
  Administered 2019-02-18: 3 mg via INTRAVENOUS

## 2019-02-18 MED ORDER — THROMBIN 5000 UNITS EX SOLR
CUTANEOUS | Status: DC | PRN
  Administered 2019-02-18 (×4): 5000 [IU] via TOPICAL

## 2019-02-18 MED ORDER — ONDANSETRON HCL 4 MG/2ML IJ SOLN
INTRAMUSCULAR | Status: AC
  Filled 2019-02-18: qty 2

## 2019-02-18 MED ORDER — MORPHINE SULFATE (PF) 2 MG/ML IV SOLN: 1.0000 mg | INTRAVENOUS | Status: DC | PRN

## 2019-02-18 MED ORDER — HYDROMORPHONE HCL 1 MG/ML IJ SOLN
INTRAMUSCULAR | Status: AC
  Filled 2019-02-18: qty 1

## 2019-02-18 MED ORDER — HYDROCODONE-ACETAMINOPHEN 5-325 MG PO TABS
1.0000 | ORAL_TABLET | ORAL | Status: DC | PRN
  Administered 2019-02-18: 1 via ORAL
  Filled 2019-02-18: qty 1

## 2019-02-18 MED ORDER — THROMBIN 5000 UNITS EX SOLR
CUTANEOUS | Status: AC
  Filled 2019-02-18: qty 10000

## 2019-02-18 MED ORDER — LACTATED RINGERS IV SOLN
INTRAVENOUS | Status: DC | PRN
  Administered 2019-02-18 (×2): via INTRAVENOUS

## 2019-02-18 MED ORDER — MIDAZOLAM HCL 2 MG/2ML IJ SOLN
INTRAMUSCULAR | Status: DC | PRN
  Administered 2019-02-18: 1 mg via INTRAVENOUS

## 2019-02-18 MED ORDER — LABETALOL HCL 5 MG/ML IV SOLN: 10.0000 mg | INTRAVENOUS | Status: DC | PRN

## 2019-02-18 MED ORDER — HYDROMORPHONE HCL 1 MG/ML IJ SOLN
0.2500 mg | INTRAMUSCULAR | Status: DC | PRN
  Administered 2019-02-18: 0.5 mg via INTRAVENOUS

## 2019-02-18 MED ORDER — THROMBIN 5000 UNITS EX SOLR
CUTANEOUS | Status: AC
  Filled 2019-02-18: qty 5000

## 2019-02-18 MED ORDER — ONDANSETRON HCL 4 MG/2ML IJ SOLN
INTRAMUSCULAR | Status: DC | PRN
  Administered 2019-02-18: 4 mg via INTRAVENOUS

## 2019-02-18 MED ORDER — ONDANSETRON HCL 4 MG PO TABS: 4.0000 mg | ORAL_TABLET | ORAL | Status: DC | PRN

## 2019-02-18 MED ORDER — FENTANYL CITRATE (PF) 250 MCG/5ML IJ SOLN
INTRAMUSCULAR | Status: DC | PRN
  Administered 2019-02-18: 25 ug via INTRAVENOUS
  Administered 2019-02-18: 150 ug via INTRAVENOUS
  Administered 2019-02-18: 25 ug via INTRAVENOUS
  Administered 2019-02-18 (×3): 50 ug via INTRAVENOUS

## 2019-02-18 MED ORDER — ONDANSETRON HCL 4 MG/2ML IJ SOLN: 4.0000 mg | Freq: Once | INTRAMUSCULAR | Status: DC | PRN

## 2019-02-18 MED ORDER — ROCURONIUM BROMIDE 10 MG/ML (PF) SYRINGE
PREFILLED_SYRINGE | INTRAVENOUS | Status: AC
  Filled 2019-02-18: qty 10

## 2019-02-18 MED ORDER — MEPERIDINE HCL 25 MG/ML IJ SOLN: 6.2500 mg | INTRAMUSCULAR | Status: DC | PRN

## 2019-02-18 MED ORDER — GLYCOPYRROLATE PF 0.2 MG/ML IJ SOSY
PREFILLED_SYRINGE | INTRAMUSCULAR | Status: AC
  Filled 2019-02-18: qty 2

## 2019-02-18 MED ORDER — BACITRACIN ZINC 500 UNIT/GM EX OINT
TOPICAL_OINTMENT | CUTANEOUS | Status: AC
  Filled 2019-02-18: qty 28.35

## 2019-02-18 MED ORDER — NEOSTIGMINE METHYLSULFATE 3 MG/3ML IV SOSY
PREFILLED_SYRINGE | INTRAVENOUS | Status: AC
  Filled 2019-02-18: qty 3

## 2019-02-18 MED ORDER — PHENYLEPHRINE 40 MCG/ML (10ML) SYRINGE FOR IV PUSH (FOR BLOOD PRESSURE SUPPORT)
PREFILLED_SYRINGE | INTRAVENOUS | Status: AC
  Filled 2019-02-18: qty 20

## 2019-02-18 MED ORDER — PROMETHAZINE HCL 25 MG PO TABS: 12.5000 mg | ORAL_TABLET | ORAL | Status: DC | PRN

## 2019-02-18 MED ORDER — LEVETIRACETAM IN NACL 500 MG/100ML IV SOLN
500.0000 mg | Freq: Two times a day (BID) | INTRAVENOUS | Status: DC
  Administered 2019-02-18 – 2019-02-19 (×2): 500 mg via INTRAVENOUS
  Filled 2019-02-18 (×3): qty 100

## 2019-02-18 MED ORDER — HEPARIN SODIUM (PORCINE) 5000 UNIT/ML IJ SOLN: 5000.0000 [IU] | Freq: Three times a day (TID) | INTRAMUSCULAR | Status: DC

## 2019-02-18 MED ORDER — PHENYLEPHRINE 40 MCG/ML (10ML) SYRINGE FOR IV PUSH (FOR BLOOD PRESSURE SUPPORT)
PREFILLED_SYRINGE | INTRAVENOUS | Status: DC | PRN
  Administered 2019-02-18 (×2): 80 ug via INTRAVENOUS
  Administered 2019-02-18: 40 ug via INTRAVENOUS
  Administered 2019-02-18: 80 ug via INTRAVENOUS
  Administered 2019-02-18: 40 ug via INTRAVENOUS
  Administered 2019-02-18 (×3): 80 ug via INTRAVENOUS

## 2019-02-18 MED ORDER — FENTANYL CITRATE (PF) 250 MCG/5ML IJ SOLN
INTRAMUSCULAR | Status: AC
  Filled 2019-02-18: qty 5

## 2019-02-18 MED ORDER — SODIUM CHLORIDE 0.9 % IV SOLN
INTRAVENOUS | Status: DC | PRN
  Administered 2019-02-18: 10 ug/min via INTRAVENOUS

## 2019-02-18 MED ORDER — LIDOCAINE 2% (20 MG/ML) 5 ML SYRINGE
INTRAMUSCULAR | Status: DC | PRN
  Administered 2019-02-18: 100 mg via INTRAVENOUS

## 2019-02-18 MED ORDER — CEFAZOLIN SODIUM 1 G IJ SOLR
INTRAMUSCULAR | Status: AC
  Filled 2019-02-18: qty 20

## 2019-02-18 MED ORDER — PROPOFOL 10 MG/ML IV BOLUS
INTRAVENOUS | Status: AC
  Filled 2019-02-18: qty 20

## 2019-02-18 MED ORDER — GLYCOPYRROLATE 0.2 MG/ML IJ SOLN
INTRAMUSCULAR | Status: DC | PRN
  Administered 2019-02-18: 0.4 mg via INTRAVENOUS

## 2019-02-18 MED ORDER — HEMOSTATIC AGENTS (NO CHARGE) OPTIME
TOPICAL | Status: DC | PRN
  Administered 2019-02-18 (×2): 1

## 2019-02-18 MED ORDER — EPHEDRINE SULFATE-NACL 50-0.9 MG/10ML-% IV SOSY
PREFILLED_SYRINGE | INTRAVENOUS | Status: DC | PRN
  Administered 2019-02-18: 5 mg via INTRAVENOUS

## 2019-02-18 MED ORDER — 0.9 % SODIUM CHLORIDE (POUR BTL) OPTIME
TOPICAL | Status: DC | PRN
  Administered 2019-02-18 (×5): 1000 mL

## 2019-02-18 MED ORDER — MIDAZOLAM HCL 2 MG/2ML IJ SOLN
INTRAMUSCULAR | Status: AC
  Filled 2019-02-18: qty 2

## 2019-02-18 MED ORDER — LIDOCAINE-EPINEPHRINE 0.5 %-1:200000 IJ SOLN
INTRAMUSCULAR | Status: AC
  Filled 2019-02-18: qty 1

## 2019-02-18 MED ORDER — PANTOPRAZOLE SODIUM 40 MG IV SOLR
40.0000 mg | Freq: Every day | INTRAVENOUS | Status: DC
  Administered 2019-02-18: 40 mg via INTRAVENOUS
  Filled 2019-02-18: qty 40

## 2019-02-18 MED ORDER — CEFAZOLIN SODIUM-DEXTROSE 2-3 GM-%(50ML) IV SOLR
INTRAVENOUS | Status: DC | PRN
  Administered 2019-02-18: 2 g via INTRAVENOUS

## 2019-02-18 MED ORDER — PROPOFOL 1000 MG/100ML IV EMUL
INTRAVENOUS | Status: AC
  Filled 2019-02-18: qty 100

## 2019-02-18 MED ORDER — ROCURONIUM BROMIDE 10 MG/ML (PF) SYRINGE
PREFILLED_SYRINGE | INTRAVENOUS | Status: DC | PRN
  Administered 2019-02-18: 50 mg via INTRAVENOUS
  Administered 2019-02-18: 30 mg via INTRAVENOUS
  Administered 2019-02-18 (×2): 20 mg via INTRAVENOUS

## 2019-02-18 MED ORDER — ONDANSETRON HCL 4 MG/2ML IJ SOLN: 4.0000 mg | INTRAMUSCULAR | Status: DC | PRN

## 2019-02-18 MED ORDER — LIDOCAINE-EPINEPHRINE 0.5 %-1:200000 IJ SOLN
INTRAMUSCULAR | Status: DC | PRN
  Administered 2019-02-18: 10 mL

## 2019-02-18 MED ORDER — PROPOFOL 500 MG/50ML IV EMUL
INTRAVENOUS | Status: DC | PRN
  Administered 2019-02-18: 75 ug/kg/min via INTRAVENOUS

## 2019-02-18 MED ORDER — PROPOFOL 10 MG/ML IV BOLUS
INTRAVENOUS | Status: DC | PRN
  Administered 2019-02-18: 60 mg via INTRAVENOUS
  Administered 2019-02-18: 100 mg via INTRAVENOUS

## 2019-02-18 MED ORDER — LIDOCAINE 2% (20 MG/ML) 5 ML SYRINGE
INTRAMUSCULAR | Status: AC
  Filled 2019-02-18: qty 5

## 2019-02-18 MED ORDER — VANCOMYCIN HCL 1000 MG IV SOLR
INTRAVENOUS | Status: AC
  Filled 2019-02-18: qty 1000

## 2019-02-18 MED ORDER — NALOXONE HCL 0.4 MG/ML IJ SOLN: 0.0800 mg | INTRAMUSCULAR | Status: DC | PRN

## 2019-02-18 SURGICAL SUPPLY — 69 items
BIT DRILL WIRE PASS 1.3MM (BIT) ×1 IMPLANT
BLADE CLIPPER SURG (BLADE) ×6 IMPLANT
BLADE SURG 11 STRL SS (BLADE) ×3 IMPLANT
BOOT SUTURE AID YELLOW STND (SUTURE) ×3 IMPLANT
BUR ACORN 6.0 PRECISION (BURR) ×4 IMPLANT
BUR ACORN 6.0MM PRECISION (BURR) ×2
BUR SPIRAL ROUTER 2.3 (BUR) ×2 IMPLANT
BUR SPIRAL ROUTER 2.3MM (BUR) ×1
CANISTER SUCT 3000ML PPV (MISCELLANEOUS) ×3 IMPLANT
CARTRIDGE OIL MAESTRO DRILL (MISCELLANEOUS) ×1 IMPLANT
CATH PERITONEAL W/RADIOSTRIP (CATHETERS) ×3 IMPLANT
CATH ROBINSON RED A/P 10FR (CATHETERS) ×3 IMPLANT
CATH VENTRICULAR 14CMX1.4MM (Miscellaneous) ×6 IMPLANT
CLIP RANEY DISP (INSTRUMENTS) ×3 IMPLANT
CLOSURE WOUND 1/4X4 (GAUZE/BANDAGES/DRESSINGS)
CONNECTOR SHUNT 1.2MMX1.8MM (MISCELLANEOUS) ×3 IMPLANT
COVER BACK TABLE 60X90IN (DRAPES) ×6 IMPLANT
COVER WAND RF STERILE (DRAPES) ×3 IMPLANT
DECANTER SPIKE VIAL GLASS SM (MISCELLANEOUS) ×3 IMPLANT
DERMABOND ADVANCED (GAUZE/BANDAGES/DRESSINGS) ×2
DERMABOND ADVANCED .7 DNX12 (GAUZE/BANDAGES/DRESSINGS) ×1 IMPLANT
DIFFUSER DRILL AIR PNEUMATIC (MISCELLANEOUS) ×3 IMPLANT
DRAPE INCISE IOBAN 66X45 STRL (DRAPES) ×6 IMPLANT
DRAPE ORTHO SPLIT 77X108 STRL (DRAPES) ×4
DRAPE POUCH INSTRU U-SHP 10X18 (DRAPES) ×3 IMPLANT
DRAPE SURG ORHT 6 SPLT 77X108 (DRAPES) ×2 IMPLANT
DRILL WIRE PASS 1.3MM (BIT) ×3
DRSG OPSITE POSTOP 3X4 (GAUZE/BANDAGES/DRESSINGS) ×12 IMPLANT
DURAPREP 26ML APPLICATOR (WOUND CARE) ×6 IMPLANT
ELECT REM PT RETURN 9FT ADLT (ELECTROSURGICAL) ×3
ELECTRODE REM PT RTRN 9FT ADLT (ELECTROSURGICAL) ×1 IMPLANT
GAUZE 4X4 16PLY RFD (DISPOSABLE) IMPLANT
GLOVE ECLIPSE 6.5 STRL STRAW (GLOVE) ×3 IMPLANT
GLOVE EXAM NITRILE XL STR (GLOVE) IMPLANT
GOWN STRL REUS W/ TWL LRG LVL3 (GOWN DISPOSABLE) ×2 IMPLANT
GOWN STRL REUS W/ TWL XL LVL3 (GOWN DISPOSABLE) IMPLANT
GOWN STRL REUS W/TWL 2XL LVL3 (GOWN DISPOSABLE) IMPLANT
GOWN STRL REUS W/TWL LRG LVL3 (GOWN DISPOSABLE) ×4
GOWN STRL REUS W/TWL XL LVL3 (GOWN DISPOSABLE)
HEMOSTAT SURGICEL 2X14 (HEMOSTASIS) IMPLANT
KIT BASIN OR (CUSTOM PROCEDURE TRAY) ×3 IMPLANT
KIT TURNOVER KIT B (KITS) ×3 IMPLANT
MARKER SKIN DUAL TIP RULER LAB (MISCELLANEOUS) ×3 IMPLANT
NEEDLE HYPO 25X1 1.5 SAFETY (NEEDLE) ×6 IMPLANT
NS IRRIG 1000ML POUR BTL (IV SOLUTION) ×15 IMPLANT
OIL CARTRIDGE MAESTRO DRILL (MISCELLANEOUS) ×3
PACK LAMINECTOMY NEURO (CUSTOM PROCEDURE TRAY) ×3 IMPLANT
PAD ARMBOARD 7.5X6 YLW CONV (MISCELLANEOUS) ×9 IMPLANT
Peritoneal Catheter (Catheter) ×2 IMPLANT
SPONGE LAP 4X18 RFD (DISPOSABLE) IMPLANT
SPONGE SURGIFOAM ABS GEL 12-7 (HEMOSTASIS) IMPLANT
SPONGE SURGIFOAM ABS GEL SZ50 (HEMOSTASIS) ×6 IMPLANT
STAPLER SKIN PROX WIDE 3.9 (STAPLE) ×3 IMPLANT
STAPLER VISISTAT 35W (STAPLE) ×3 IMPLANT
STRIP CLOSURE SKIN 1/4X4 (GAUZE/BANDAGES/DRESSINGS) IMPLANT
SUT BONE WAX W31G (SUTURE) ×3 IMPLANT
SUT ETHILON 3 0 PS 1 (SUTURE) ×3 IMPLANT
SUT NURALON 4 0 TR CR/8 (SUTURE) IMPLANT
SUT SILK 0 TIES 10X30 (SUTURE) ×6 IMPLANT
SUT SILK 3 0 SH 30 (SUTURE) IMPLANT
SUT VIC AB 2-0 CT2 18 VCP726D (SUTURE) ×9 IMPLANT
SUT VIC AB 3-0 SH 8-18 (SUTURE) ×3 IMPLANT
SYR 5ML LL (SYRINGE) ×3 IMPLANT
SYR CONTROL 10ML LL (SYRINGE) ×3 IMPLANT
TOWEL GREEN STERILE (TOWEL DISPOSABLE) ×3 IMPLANT
TOWEL GREEN STERILE FF (TOWEL DISPOSABLE) ×3 IMPLANT
UNDERPAD 30X30 (UNDERPADS AND DIAPERS) ×3 IMPLANT
VALVE RT ANGLE UNITIZE DIST (Valve) ×3 IMPLANT
WATER STERILE IRR 1000ML POUR (IV SOLUTION) ×3 IMPLANT

## 2019-02-18 NOTE — Progress Notes (Signed)
Dr. Christella Noa was concerned about catheter placement during surgery therefore he ordered that the patient be taken to CT imaging to confirm placement. The patient was taken to CT imaging and then brought back to the OR to finish the procedure. Patient exited the room at 9:00am and returned 9:22am to OR 18

## 2019-02-18 NOTE — Progress Notes (Signed)
PROGRESS NOTE    Janet Szwed  [id number] DOB: [birth year] DOA: 02/07/2019 PCP: [name], MD  Brief Narrative:48 year old male with history of CLL, hypertension, diabetes type 2 who presented with headache, diplopia. He underwent work-up for headache as an outpatient. He was seen by neurology and underwent lumbar puncture under fluoroscopy and was found to have elevated opening pressure of 55. He noted immediate improvement in headache after lumbar puncture. Cryptococcal antigen titers came out to be positive at 1: 64 and patient was sent to the emergency department. ID consulted after admission. Started on antibiotics. His symptoms of headache and diplopia have significantly improved. Hospital course remarkable for persistent elevated opening pressure so he has been undergoing series of lumbar punctures. Plan for another puncture lumbar tomorrow.  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  Continue amphotericin and flucytosine.ID recommends 4 weeks of IV therapy before transitioning to oral fluconazole.  MRI 02/16/2019-abnormal leptomeningeal enhancement which is new and consistent with meningitis.  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  No formed abscess.  Pharmacy following and monitoring renal functions potassium and magnesium while he is on amphotericin.   #2 history of CLL chemotherapy on hold.  #3 hypertension continue metoprolol.  BP  stable.  Estimated body mass index is 29.24 kg/m as calculated from the following:   Height as of this encounter: 5\' 10"  (1.778 m).   Weight as of this encounter: 92.4 kg.  DVT prophylaxis:SCD  code Status:Full code  family Communication:None at bedside  disposition Plan:Pending clinical improvement patient is currently on IV antibiotics  Consultants:  Neurosurgery and ID  Procedures:Lumbar punctures with patient 02/18/2019 antimicrobials:Amphotericin B, acyclovir, flucytosine    Subjective: Resting in bed  Objective: Vitals:   02/18/19 1354 02/18/19 1426 02/18/19 1454 02/18/19 1525  BP: 110/76 111/78 111/76 100/75  Pulse: 99 100 (!) 102 (!) 103  Resp: 20 (!) 25 (!) 24 20  Temp:      TempSrc:      SpO2: 96% 96% 96% 98%  Weight:      Height:        Intake/Output Summary (Last 24 hours) at 02/18/2019 1555 Last data filed at 02/18/2019 1124 Gross per 24 hour  Intake 4763.06 ml  Output 100 ml  Net 4663.06 ml   Filed Weights   02/12/19 2100 02/15/19 0514 02/18/19 0659  Weight: 93.1 kg 92.4 kg 92.4 kg    Examination:  General exam: Appears calm and comfortable  Respiratory system: Clear to auscultation. Respiratory effort normal. Cardiovascular system: S1 & S2 heard, RRR. No JVD, murmurs, rubs, gallops or clicks. No pedal edema. Gastrointestinal system: Abdomen is nondistended, soft and nontender. No organomegaly or masses felt. Normal bowel sounds heard. Central nervous system: Alert and oriented. No focal neurological deficits. Extremities: Symmetric 5 x 5 power. Skin: No rashes, lesions or ulcers Psychiatry: Judgement and insight appear normal. Mood & affect appropriate.     Data Reviewed: I have personally reviewed following labs and imaging studies  CBC: Recent Labs  Lab 02/14/19 0356 02/15/19 0316 02/16/19  [id number] 02/17/19 0328 02/18/19 0454  WBC 11.6* 11.7* 10.5 10.9* 9.2  HGB 14.6 14.6 14.6 14.4 14.2  HCT 43.9 43.9 43.4 43.5 41.8  MCV  82.2 83.3 83.0 82.9 82.9  PLT 177 147* 142* 120* [id number]*   Basic Metabolic Panel: Recent Labs  Lab 02/12/19 0336  02/14/19 0356 02/15/19 0316 02/16/19 0359 02/17/19 0328 02/18/19 0454  NA 140   < > 141 141 139 141 142  K 3.9   < > 4.1 4.3 3.9 4.0 4.1  CL 110   < > 111 110 108 107 110  CO2 22   < > 22 21* 22 23 22   GLUCOSE 105*   < > 108* 107* 109* 98 104*  BUN 7   < > 8 8 8 10 7   CREATININE 0.84   < > 1.06 1.07 1.05 1.11 1.12  CALCIUM 9.1   < > 9.0 9.1 9.0 9.1 9.0  MG 2.3   < > 2.1 2.2 1.9 2.0 1.8  PHOS 4.5  --   --   --   --   --   --    < > = values in this interval not displayed.   GFR: Estimated Creatinine Clearance: 94.2 mL/min (by C-G formula based on SCr of 1.12 mg/dL). Liver Function Tests: Recent Labs  Lab 02/12/19 0336  ALBUMIN 3.4*   No results for input(s): LIPASE, AMYLASE in the last 168 hours. No results for input(s): AMMONIA in the last 168 hours. Coagulation Profile: Recent Labs  Lab 02/16/19 0825  INR 1.1   Cardiac Enzymes: No results for input(s): CKTOTAL, CKMB, CKMBINDEX, TROPONINI in the last 168 hours. BNP (last 3 results) No results for input(s): PROBNP in the last 8760 hours. HbA1C: No results for input(s): HGBA1C in the last 72 hours. CBG: Recent Labs  Lab 02/12/19 1221 02/18/19 1155  GLUCAP 98 118*   Lipid Profile: No results for input(s): CHOL, HDL, LDLCALC, TRIG, CHOLHDL, LDLDIRECT in the last 72 hours. Thyroid Function Tests: No results for input(s): TSH, T4TOTAL, FREET4, T3FREE, THYROIDAB in the last 72 hours. Anemia Panel: No results for input(s): VITAMINB12, FOLATE, FERRITIN, TIBC, IRON, RETICCTPCT in the last 72 hours. Sepsis Labs: No results for input(s): PROCALCITON, LATICACIDVEN in the last 168 hours.  Recent Results (from the past 240 hour(s))  CSF culture     Status: None   Collection Time: 02/09/19 11:36 AM   Specimen: PATH Cytology CSF; Cerebrospinal Fluid  Result Value Ref Range Status   Specimen Description CSF   Final   Special Requests NONE  Final   Gram Stain   Final    WBC PRESENT,BOTH PMN AND MONONUCLEAR NO ORGANISMS SEEN CYTOSPIN SMEAR    Culture   Final    NO GROWTH 3 DAYS Performed at [facility], [address], [place], [place] [address]    Report Status 02/13/2019 FINAL  Final  CSF culture with Stat gram stain     Status: None   Collection Time: 02/11/19 11:32 AM   Specimen: CSF; Cerebrospinal Fluid  Result Value Ref Range Status   Specimen Description CSF  Final   Special Requests Immunocompromised  Final   Gram Stain   Final    WBC PRESENT, PREDOMINANTLY MONONUCLEAR NO ORGANISMS SEEN CYTOSPIN SMEAR    Culture   Final    NO GROWTH Performed at [facility], [place] [address], [place], [place] [address]    Report Status 02/14/2019 FINAL  Final  CSF culture  Status: None   Collection Time: 02/12/19 11:14 AM   Specimen: PATH Cytology CSF; Cerebrospinal Fluid  Result Value Ref Range Status   Specimen Description CSF  Final   Special Requests NONE  Final   Gram Stain   Final    WBC PRESENT, PREDOMINANTLY MONONUCLEAR NO ORGANISMS SEEN CYTOSPIN SMEAR    Culture   Final    NO GROWTH 3 DAYS Performed at [facility], [place] [address], [place], [place] [address]    Report Status 02/15/2019 FINAL  Final  Fungus Culture With Stain     Status: None (Preliminary result)   Collection Time: 02/14/19  7:29 PM  Result Value Ref Range Status   Fungus Stain Final report  Final    Comment: (NOTE) Performed At: [facility] [place], [place] [id number] Rush Farmer MD [id number]    Fungus (Mycology) Culture PENDING  Incomplete   Fungal Source PENDING  Incomplete  Fungus Culture Result     Status: None   Collection Time: 02/14/19  7:29 PM  Result Value Ref Range Status   Result 1 Comment  Final    Comment: (NOTE) KOH/Calcofluor preparation:  no fungus observed. Performed At: [facility] [place], [place]  [id number] Rush Farmer MD [id number]   CSF culture with Stat gram stain     Status: None   Collection Time: 02/14/19  7:53 PM   Specimen: CSF; Cerebrospinal Fluid  Result Value Ref Range Status   Specimen Description CSF  Final   Special Requests Normal  Final   Gram Stain   Final    WBC PRESENT, PREDOMINANTLY PMN NO ORGANISMS SEEN CYTOSPIN SMEAR    Culture   Final    NO GROWTH 3 DAYS Performed at [facility], [address], [place], [place] [address]    Report Status 02/18/2019 FINAL  Final  Surgical pcr screen     Status: None   Collection Time: 02/16/19  8:46 AM   Specimen: Nasal Mucosa; Nasal Swab  Result Value Ref Range Status   MRSA, PCR NEGATIVE NEGATIVE Final   Staphylococcus aureus NEGATIVE NEGATIVE Final    Comment: (NOTE) The Xpert SA Assay (FDA approved for NASAL specimens in patients 26 years of age and older), is one component of a comprehensive surveillance program. It is not intended to diagnose infection nor to guide or monitor treatment. Performed at [facility], [place] [address] [place], [place] [address]          Radiology Studies: Ct Head Wo Contrast  Result Date: 02/18/2019 CLINICAL DATA:  Patient for VP shunt in OR found to have some blood mixed with CSF, rule out intracranial hemorrhage. EXAM: CT HEAD WITHOUT CONTRAST TECHNIQUE: Contiguous axial images were obtained from the base of the skull through the vertex without intravenous contrast. COMPARISON:  Brain MRI 02/16/2019, head CT 02/16/2019. FINDINGS: Brain: There are external components of a new right-sided shunt system extending to a right parietal burr hole. There are multiple small foci of gas within the adjacent high right frontal lobe, however, no intracranial ventricular catheter is identified. The ventricular system is unchanged in size and configuration as compared to prior MR and CT 02/16/2019 without overt hydrocephalus. No significant acute intracranial hemorrhage.  Grossly unchanged region of parenchymal hypodensity within the anterosuperior right frontal lobe extending to involve the anterior body/genu of the corpus callosum. 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 No midline shift or extra-axial fluid collection.  Vascular: No hyperdense vessel. Skull: No calvarial fracture or suspicious osseous lesion. Right parietal burr hole. Sinuses/Orbits: Visualized orbits demonstrate no acute abnormality. Mild to moderate ethmoid sinus mucosal thickening. Scattered mild mucosal thickening within remaining paranasal sinuses. No significant mastoid effusion Findings within impression 1 called by telephone at the time of interpretation on 02/18/2019 at 12:46 pm to provider KYLE CABBELL , who verbally acknowledged these results. Per Dr. Christella Noa, the intracranial component of the shunt was not left in place while in the OR, and would not be expected to be seen. IMPRESSION: 1. The external components of a new right-sided shunt system extend to a right parietal burr hole. There are multiple small foci of gas within the adjacent high right frontal lobe, although no intracranial ventricular catheter is present. No significant acute intracranial hemorrhage. No overt hydrocephalus. 2. Grossly unchanged region of hypodensity within the anterosuperior right frontal lobe and callosal anterior body/genu consistent with previously demonstrated cerebritis. Electronically Signed   By: Kellie Simmering   On: 02/18/2019 12:51   Mr Brain W Wo Contrast  Result Date: 02/16/2019 CLINICAL DATA:  Meningitis due to other and unspecified causes. EXAM: MRI HEAD WITHOUT AND WITH CONTRAST TECHNIQUE: Multiplanar, multiecho pulse sequences of the brain and surrounding structures were obtained without and with intravenous contrast. CONTRAST:  8mL GADAVIST GADOBUTROL 1 MMOL/ML IV SOLN COMPARISON:  Head CT 02/16/2019, brain MRI 01/31/2019 FINDINGS: Brain: New from prior MRI 01/31/2019, there is  significant supratentorial and infratentorial leptomeningeal enhancement, most prominent along the anterosuperior interhemispheric fissure, and consistent with the provided history of meningitis. Also new from prior MRI, within the paramedian anterosuperior right frontal lobe there is prominent cortical/subcortical edema which extends into the anterior body/genu of the corpus callosum. Associated irregular foci of restricted diffusion within the region of right frontal lobe parenchymal signal abnormality. The constellation of findings are consistent with cerebritis. No formed parenchymal abscess on the current examination. Of note, abnormal leptomeningeal enhancement extends into the left internal auditory canal (series 10, image 13). No midline shift, hydrocephalus or extra-axial fluid collection. No chronic intracranial blood products. Additional mild scattered foci of T2/FLAIR hyperintensity within the cerebral white matter are nonspecific, but may reflect early chronic small vessel ischemic disease. Vascular: Flow voids maintained within the proximal large arterial vessels. Expected enhancement within the dural venous sinuses. Skull and upper cervical spine: No focal marrow lesion. Sinuses/Orbits: Visualized orbits demonstrate no acute abnormality. Mild ethmoid sinus mucosal thickening. No significant mastoid effusion. Other: Redemonstrated prominent lymph nodes within the upper neck consistent with the provided history of CLL. These results will be called to the ordering clinician or representative by the Radiologist Assistant, and communication documented in the PACS or zVision Dashboard. IMPRESSION: 1. Abnormal leptomeningeal enhancement new from prior MRI 01/31/2019 and consistent with the provided history of meningitis. Enhancement is most prominent along the anterosuperior interhemispheric fissure. Also of note, abnormal leptomeningeal enhancement extends into the left IAC. 2. Confirmed cerebritis  involving the paramedian anterosuperior right frontal lobe, also extending into the anterior body/genu of the corpus callosum. Associated irregular restricted diffusion within the region of right frontal lobe parenchymal signal abnormality. No formed abscess on the current study. 3. No midline shift, hydrocephalus or extra-axial fluid collection. Electronically Signed   By: Kellie Simmering   On: 02/16/2019 23:24        Scheduled Meds:  acyclovir  400 mg Oral BID   Chlorhexidine Gluconate Cloth  6 each Topical Daily   dextrose  10 mL Intravenous Q24H  dextrose  10 mL Intravenous Q24H   flucytosine  25 mg/kg Oral Q6H   HYDROmorphone       loratadine  10 mg Oral Daily   metoprolol tartrate  12.5 mg Oral BID   potassium chloride  40 mEq Oral BID   sodium chloride  1,000 mL Intravenous Q24H   sodium chloride  500 mL Intravenous Q24H   Continuous Infusions:  sodium chloride 75 mL/hr at 02/18/19 0042   amphotericin  B  Liposome (AMBISOME) ADULT IV 250 mg (02/17/19 1822)     LOS: 11 days     Georgette Shell, MD [facility]  If 7PM-7AM, please contact night-coverage [email or web address] Password [email or web address] 02/18/2019, 3:55 PM

## 2019-02-18 NOTE — Op Note (Signed)
02/18/2019  11:59 AM  PATIENT:  David Whitehead  48 y.o. 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 Bruss was brought to the operating room intubated and placed under a general anesthetic without difficulty. He was positioned supine on the operating room table. His head was shaved and prepped in a sterile manner. The neck and abdomen was also prepped and draped in a sterile manner. I infiltrated lidocaine into the abdomen, in the right upper quadrant just off the midline. I also infiltrated lidocaine into the planned scalp incision.  I opened the abdomen with a 10 blade and dissected through the subcutaneous tissue to the anterior rectus sheath. I opened the anterior rectus sheath with Metzenbaum scissors. I then bluntly divided the rectus muscle and retracted it with vicryl sutures, exposing the posterior rectus sheath. I opened the posterior rectus sheath with Metzenbaum scissors just enough to expose the peritoneum. I pulled the peritoneum up with straight snaps and secured and released it a few times to make sure no bowel would be harmed. I opened the peritoneum with the scissors and secured the edges with the snaps.  I then tunneled from the abdominal incision to the post auricular region. I opened the skin with a scalpel over the tunneler then brought the tunneler through the incision. I passed a silk tie from the post auricular incision to the abdominal incisioin, securing the ends with hemostats. I then opened the scalp incision with a 10 blade. I created a burr hole with  the drill and acorn attachment. I tunneled from the scalp incision to the post auricular incision using another silk tie. I brought the shunt into the operative field and using the ties pulled the distal end of the catheter out of the abdominal incision.  I opened the dura and placed the ventricular catheter into the brain without csf drainage. I took two more passes and appreciated blood coming through the catheter. At that time I closed the 3 incisions, and we went with the patient intubated to CT. The scan did not show a hematoma. We came back to the OR where he was prepped and draped in a sterile fashion. I felt the frontal burr hole was too posterior, and decided that my postauricular incision was essentially at [facility] point. I then removed the valve and shunt tubing from the frontal incision. I created a new burr hole at [facility] point and passed the catheter into the ventricle with good csf flow. I connected the valve and rickham via a straight connector to the peritoneal tubing. I  then cut the ventricular catheter and secured it to the integrated shunt. I observed good flow from the distal end of the catheter. At this time the distal catheter was placed into the peritoneum, and the abdominal incision was closed in layers using vicryl sutures. Dermabond was used for a sterile dressing. The cranial incisions were then closed with galeal sutures, and the scalp with staples. Sterile dressings were applied.   PLAN OF CARE: Admit to inpatient   PATIENT DISPOSITION:  PACU - hemodynamically stable.   Delay start of Pharmacological VTE agent (>24hrs) due to surgical blood loss  or risk of bleeding:  yes

## 2019-02-18 NOTE — Transfer of Care (Signed)
Immediate Anesthesia Transfer of Care Note  [name]  Procedure(s) Performed: SHUNT INSERTION VENTRICULAR-PERITONEAL (Right Head)  Patient Location: PACU  Anesthesia Type:General  Level of Consciousness: awake, alert , oriented, patient cooperative and responds to stimulation  Airway & Oxygen Therapy: Patient Spontanous Breathing and Patient connected to nasal cannula oxygen  Post-op Assessment: Report given to RN, Post -op Vital signs reviewed and stable, Patient moving all extremities X 4 and Patient able to stick tongue midline  Post vital signs: Reviewed and stable  Last Vitals:  Vitals Value Taken Time  BP 120/80 02/18/19 1154  Temp 36.6 C 02/18/19 1154  Pulse 72 02/18/19 1203  Resp 23 02/18/19 1203  SpO2 96 % 02/18/19 1203  Vitals shown include unvalidated device data.  Last Pain:  Vitals:   02/18/19 1154  TempSrc:   PainSc: 0-No pain         Complications: No apparent anesthesia complications

## 2019-02-18 NOTE — Anesthesia Preprocedure Evaluation (Signed)
Anesthesia Evaluation  Patient identified by MRN, date of birth, ID band Patient awake    Reviewed: Allergy & Precautions, NPO status , Patient's Chart, lab work & pertinent test results  Airway Mallampati: I  TM Distance: >3 FB Neck ROM: Full    Dental   Pulmonary sleep apnea ,    Pulmonary exam normal        Cardiovascular hypertension, Pt. on medications Normal cardiovascular exam     Neuro/Psych    GI/Hepatic   Endo/Other    Renal/GU      Musculoskeletal   Abdominal   Peds  Hematology   Anesthesia Other Findings   Reproductive/Obstetrics                             Anesthesia Physical Anesthesia Plan  ASA: III  Anesthesia Plan: General   Post-op Pain Management:    Induction: Intravenous  PONV Risk Score and Plan: 2 and Ondansetron and Treatment may vary due to age or medical condition  Airway Management Planned: Oral ETT  Additional Equipment:   Intra-op Plan:   Post-operative Plan: Extubation in OR  Informed Consent: I have reviewed the patients History and Physical, chart, labs and discussed the procedure including the risks, benefits and alternatives for the proposed anesthesia with the patient or authorized representative who has indicated his/her understanding and acceptance.       Plan Discussed with: CRNA and Surgeon  Anesthesia Plan Comments:         Anesthesia Quick Evaluation

## 2019-02-18 NOTE — Telephone Encounter (Signed)
Pt's mom came in to pick up something and wanted me to let Dr. Nicki Reaper know that pt is at [facility] having brain surgery

## 2019-02-18 NOTE — Plan of Care (Signed)
  Problem: Health Behavior/Discharge Planning: Goal: Ability to manage health-related needs will improve Outcome: Progressing   

## 2019-02-18 NOTE — Progress Notes (Signed)
.  Patient ID: David Whitehead, male   DOB: June 05, 1970, 48 y.o.   MRN: [id number] BP (!) 133/101   Pulse (!) 103   Temp 98.7 F (37.1 C) (Oral)   Resp 19   Ht 5\' 10"  (1.778 m)   Wt 92.4 kg   SpO2 100%   BMI 29.24 kg/m  Alert and oriented x 4, speech is clear and fluent Moving all extremities well Wounds are clean, and dry

## 2019-02-18 NOTE — Telephone Encounter (Signed)
Thanks I am aware

## 2019-02-18 NOTE — Progress Notes (Signed)
Pharmacy Antibiotic Note  David Whitehead is a 48 y.o. 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 Pharmacy has been consulted for amphotericin B liposome dosing. Ambisome 3 mg/kg and flucytosine 25 mg/kg started in this patient with suspected cryptococcal meningitis. NS 561mL ordered to be given an hour before and an hour after the Ambisome infusion with D5 flushes administered inbetween. The pharmacy team will monitor the patients renal function, potassium and magnesium and make adjustments per protocol.  Potassium today is within normal limits at 4.1 and magnesium is 1.8. He is on K 40 mEq BID and has not needed magnesium replacement yet. We will continue his therapy as is for now.  SCr is holding at 1.12 today, it was 1.11 yesterday. Will watch renal function closely and continue fluids to 1 L pre Ambisome dose and 500 mL post.   Plan: Continue Ambisome 250 mg q24h (3 mg/kg) Continue Flucytosine 2,500 mg q6h (25 mg/kg) Continue potassium 40 mEq BID  Continue to replace Mg and K as needed Continue pre-dose fluid at 1000 mL and 500 mL post-dose Monitor Scr and CBC   Height: 5\' 10"  (177.8 cm) Weight: 203 lb 12.8 oz (92.4 kg) IBW/kg (Calculated) : 73  Temp (24hrs), Avg:98.8 F (37.1 C), Min:98.5 F (36.9 C), Max:99 F (37.2 C)  Recent Labs  Lab 02/14/19 0356 02/15/19 0316 02/16/19 0359 02/17/19 0328 02/18/19 0454  WBC 11.6* 11.7* 10.5 10.9* 9.2  CREATININE 1.06 1.07 1.05 1.11 1.12    Estimated Creatinine Clearance: 94.2 mL/min (by C-G formula based on SCr of 1.12 mg/dL).    Allergies  Allergen Reactions  . Bactrim [Sulfamethoxazole-Trimethoprim] Rash  . Latex Other (See Comments)    Took skin off when removed tape  . Amoxicillin Rash    Has patient had a PCN reaction causing immediate rash, facial/tongue/throat swelling, SOB or lightheadedness with hypotension: No Has patient had a PCN reaction causing severe rash involving mucus membranes or skin necrosis:  No Has patient had a PCN reaction that required hospitalization: No Has patient had a PCN reaction occurring within the last 10 years: No If all of the above answers are "NO", then may proceed with Cephalosporin use.     Antimicrobials this admission: Ambisome  9/28>> Flucytosine 9/28>>  Microbiology results: 9/28 BCx: No growth final  9/30; 10/2; 10/3 CSF culture: NGTD   Thank you for allowing pharmacy to be a part of this patient's care.  Nicoletta Dress, PharmD PGY2 Infectious Disease Pharmacy Resident  02/18/2019 9:38 AM

## 2019-02-19 DIAGNOSIS — G919 Hydrocephalus, unspecified: Secondary | ICD-10-CM

## 2019-02-19 DIAGNOSIS — Z982 Presence of cerebrospinal fluid drainage device: Secondary | ICD-10-CM

## 2019-02-19 LAB — COMPREHENSIVE METABOLIC PANEL
ALT: 19 U/L (ref 0–44)
AST: 12 U/L — ABNORMAL LOW (ref 15–41)
Albumin: 3 g/dL — ABNORMAL LOW (ref 3.5–5.0)
Alkaline Phosphatase: 41 U/L (ref 38–126)
Anion gap: 10 (ref 5–15)
BUN: 8 mg/dL (ref 6–20)
CO2: 23 mmol/L (ref 22–32)
Calcium: 8.8 mg/dL — ABNORMAL LOW (ref 8.9–10.3)
Chloride: 108 mmol/L (ref 98–111)
Creatinine, Ser: 1.21 mg/dL (ref 0.61–1.24)
GFR calc Af Amer: >60 mL/min (ref >60)
GFR calc non Af Amer: >60 mL/min (ref >60)
Glucose, Bld: 115 mg/dL — ABNORMAL HIGH (ref 70–99)
Potassium: 4.1 mmol/L (ref 3.5–5.1)
Sodium: 141 mmol/L (ref 135–145)
Total Bilirubin: 0.7 mg/dL (ref 0.3–1.2)
Total Protein: 4.7 g/dL — ABNORMAL LOW (ref 6.5–8.1)

## 2019-02-19 LAB — CBC
HCT: 38.7 % — ABNORMAL LOW (ref 39.0–52.0)
Hemoglobin: 12.7 g/dL — ABNORMAL LOW (ref 13.0–17.0)
MCH: 27.5 pg (ref 26.0–34.0)
MCHC: 32.8 g/dL (ref 30.0–36.0)
MCV: 83.9 fL (ref 80.0–100.0)
Platelets: 96 10*3/uL — ABNORMAL LOW (ref 150–400)
RBC: 4.61 MIL/uL (ref 4.22–5.81)
RDW: 13.7 % (ref 11.5–15.5)
WBC: 11 10*3/uL — ABNORMAL HIGH (ref 4.0–10.5)
nRBC: 0 % (ref 0.0–0.2)

## 2019-02-19 LAB — MAGNESIUM: Magnesium: 1.9 mg/dL (ref 1.7–2.4)

## 2019-02-19 MED ORDER — LEVETIRACETAM 500 MG PO TABS
500.0000 mg | ORAL_TABLET | Freq: Two times a day (BID) | ORAL | Status: AC
  Administered 2019-02-19 – 2019-02-25 (×12): 500 mg via ORAL
  Filled 2019-02-19 (×12): qty 1

## 2019-02-19 MED ORDER — PANTOPRAZOLE SODIUM 40 MG PO TBEC
40.0000 mg | DELAYED_RELEASE_TABLET | Freq: Every day | ORAL | Status: DC
  Administered 2019-02-19 – 2019-03-06 (×16): 40 mg via ORAL
  Filled 2019-02-19 (×16): qty 1

## 2019-02-19 NOTE — Plan of Care (Signed)
  Problem: Health Behavior/Discharge Planning: Goal: Ability to manage health-related needs will improve Outcome: Progressing   Problem: Clinical Measurements: Goal: Respiratory complications will improve Outcome: Progressing Goal: Cardiovascular complication will be avoided Outcome: Progressing   Problem: Education: Goal: Knowledge of General Education information will improve Description: Including pain rating scale, medication(s)/side effects and non-pharmacologic comfort measures Outcome: Progressing   Problem: Health Behavior/Discharge Planning: Goal: Ability to manage health-related needs will improve Outcome: Progressing

## 2019-02-19 NOTE — Progress Notes (Signed)
Patient ID: David Whitehead, male   DOB: 1970/12/17, 48 y.o.   MRN: [id number] BP 129/87   Pulse (!) 103   Temp 98.7 F (37.1 C) (Oral)   Resp (!) 25   Ht 5\' 10"  (1.778 m)   Wt 92.4 kg   SpO2 96%   BMI 29.24 kg/m  Alert and oriented x 4, speech is clear and fluent Moving all extremities well Perrl, full eom Tongue and uvula midline Will transfer to the floor today

## 2019-02-19 NOTE — Progress Notes (Signed)
PROGRESS NOTE    David Whitehead  [id number] DOB: 1970/09/19 DOA: 02/07/2019 PCP: [name], MD    Brief Narrative: 48 year old male with history of CLL, hypertension, diabetes type 2 who presented with headache, diplopia. He underwent work-up for headache as an outpatient. He was seen by neurology and underwent lumbar puncture under fluoroscopy and was found to have elevated opening pressure of 55. He noted immediate improvement in headache after lumbar puncture. Cryptococcal antigen titers came out to be positive at 1: 64 and patient was sent to the emergency department. ID consulted after admission. Started on antibiotics. His symptoms of headache and diplopia have significantly improved. Hospital course remarkable for persistent elevated opening pressure so he has been undergoing series of lumbar punctures. Plan for another puncture lumbar tomorrow.  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  Continue amphotericin and flucytosine.ID recommends 4 weeks of IV therapy before transitioning to oral fluconazole.MRI 02/16/2019-abnormal leptomeningeal enhancement which is new and consistent with meningitis. 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 No formed abscess.    Slightly elevated today compared to yesterday.  Pharmacy following and monitoring renal functions potassium and magnesium while he is on  amphotericin.  Might need PICC line for long-term IV antibiotics will wait for ID input.  #2 history of CLL chemotherapy on hold.  #3 hypertension continue metoprolol.Blood pressure stable.  Estimated body mass index is 29.24 kg/m as calculated from the following:   Height as of this encounter: 5\' 10"  (1.778 m).   Weight as of this encounter: 92.4 kg.  DVT prophylaxis:SCD  code Status:Full code  family Communication:None at bedside  disposition Plan:Pending clinical improvement patient is currently on IV antibiotics  Consultants:  Neurosurgery and ID  Procedures:Lumbar punctures with patient 02/18/2019 antimicrobials:Amphotericin B, acyclovir, flucytosine   Subjective:  Patient resting in bed anxious to go home denies any new complaints no nausea vomiting tolerating p.o. intake Objective: Vitals:   02/19/19 0900 02/19/19 1000 02/19/19 1100 02/19/19 1200  BP: 129/87 120/80 107/63 110/77  Pulse: (!) 103 95 95 78  Resp: (!) 25 (!) 26 18 (!) 23  Temp:    98.7 F (37.1 C)  TempSrc:    Oral  SpO2: 96% 94% 97% 95%  Weight:      Height:        Intake/Output Summary (Last 24 hours) at 02/19/2019 1329 Last data filed at 02/19/2019 1200 Gross per 24 hour  Intake 2731.73 ml  Output 3275 ml  Net -543.27 ml   Filed Weights   02/12/19 2100 02/15/19 0514 02/18/19 0659  Weight: 93.1 kg 92.4 kg 92.4 kg    Examination:  General exam: Appears calm and comfortable  Respiratory system: Clear to auscultation. Respiratory effort normal. Cardiovascular system: S1 & S2 heard, RRR. No JVD, murmurs, rubs, gallops or clicks. No pedal edema. Gastrointestinal system: Abdomen is nondistended, soft and nontender. No organomegaly or masses felt. Normal bowel sounds heard. Central nervous system: Alert and oriented. No focal neurological  deficits. Extremities: Symmetric 5 x 5 power. Skin: No rashes, lesions or ulcers Psychiatry: Judgement and insight appear normal. Mood & affect  appropriate.     Data Reviewed: I have personally reviewed following labs and imaging studies  CBC: Recent Labs  Lab 02/15/19 0316 02/16/19 0359 02/17/19 0328 02/18/19 0454 02/19/19 0422  WBC 11.7* 10.5 10.9* 9.2 11.0*  HGB 14.6 14.6 14.4 14.2 12.7*  HCT 43.9 43.4 43.5 41.8 38.7*  MCV 83.3 83.0 82.9 82.9 83.9  PLT 147* 142* 120* 105* 96*   Basic Metabolic Panel: Recent Labs  Lab 02/15/19 0316 02/16/19 0359 02/17/19 0328 02/18/19 0454 02/19/19 0422  NA 141 139 141 142 141  K 4.3 3.9 4.0 4.1 4.1  CL 110 108 107 110 108  CO2 21* 22 23 22 23   GLUCOSE 107* 109* 98 104* 115*  BUN 8 8 10 7 8   CREATININE 1.07 1.05 1.11 1.12 1.21  CALCIUM 9.1 9.0 9.1 9.0 8.8*  MG 2.2 1.9 2.0 1.8 1.9   GFR: Estimated Creatinine Clearance: 87.2 mL/min (by C-G formula based on SCr of 1.21 mg/dL). Liver Function Tests: Recent Labs  Lab 02/19/19 0422  AST 12*  ALT 19  ALKPHOS 41  BILITOT 0.7  PROT 4.7*  ALBUMIN 3.0*   No results for input(s): LIPASE, AMYLASE in the last 168 hours. No results for input(s): AMMONIA in the last 168 hours. Coagulation Profile: Recent Labs  Lab 02/16/19 0825  INR 1.1   Cardiac Enzymes: No results for input(s): CKTOTAL, CKMB, CKMBINDEX, TROPONINI in the last 168 hours. BNP (last 3 results) No results for input(s): PROBNP in the last 8760 hours. HbA1C: No results for input(s): HGBA1C in the last 72 hours. CBG: Recent Labs  Lab 02/18/19 1155  GLUCAP 118*   Lipid Profile: No results for input(s): CHOL, HDL, LDLCALC, TRIG, CHOLHDL, LDLDIRECT in the last 72 hours. Thyroid Function Tests: No results for input(s): TSH, T4TOTAL, FREET4, T3FREE, THYROIDAB in the last 72 hours. Anemia Panel: No results for input(s): VITAMINB12, FOLATE, FERRITIN, TIBC, IRON, RETICCTPCT in the last 72 hours. Sepsis Labs: No results for input(s): PROCALCITON, LATICACIDVEN in the last 168 hours.  Recent Results (from the past 240 hour(s))  CSF culture with Stat gram  stain     Status: None   Collection Time: 02/11/19 11:32 AM   Specimen: CSF; Cerebrospinal Fluid  Result Value Ref Range Status   Specimen Description CSF  Final   Special Requests Immunocompromised  Final   Gram Stain   Final    WBC PRESENT, PREDOMINANTLY MONONUCLEAR NO ORGANISMS SEEN CYTOSPIN SMEAR    Culture   Final    NO GROWTH Performed at [facility], [address] [place], [place] [address]    Report Status 02/14/2019 FINAL  Final  CSF culture     Status: None   Collection Time: 02/12/19 11:14 AM   Specimen: PATH Cytology CSF; Cerebrospinal Fluid  Result Value Ref Range Status   Specimen Description CSF  Final   Special Requests NONE  Final   Gram Stain   Final    WBC PRESENT, PREDOMINANTLY MONONUCLEAR NO ORGANISMS SEEN CYTOSPIN SMEAR    Culture   Final    NO GROWTH 3 DAYS Performed at [facility], [place] [address], [place], [place] [address]    Report Status 02/15/2019 FINAL  Final  Fungus Culture With Stain     Status: None (Preliminary result)   Collection Time: 02/14/19  7:29 PM  Result Value Ref  Range Status   Fungus Stain Final report  Final    Comment: (NOTE) Performed At: [facility] [place], [place] [id number] Rush Farmer MD [id number]    Fungus (Mycology) Culture PENDING  Incomplete   Fungal Source PENDING  Incomplete  Fungus Culture Result     Status: None   Collection Time: 02/14/19  7:29 PM  Result Value Ref Range Status   Result 1 Comment  Final    Comment: (NOTE) KOH/Calcofluor preparation:  no fungus observed. Performed At: [facility] [place], [place] [id number] Rush Farmer MD [id number]   CSF culture with Stat gram stain     Status: None   Collection Time: 02/14/19  7:53 PM   Specimen: CSF; Cerebrospinal Fluid  Result Value Ref Range Status   Specimen Description CSF  Final   Special Requests Normal  Final   Gram Stain   Final    WBC PRESENT, PREDOMINANTLY  PMN NO ORGANISMS SEEN CYTOSPIN SMEAR    Culture   Final    NO GROWTH 3 DAYS Performed at [facility], [address], [place], [place] [address]    Report Status 02/18/2019 FINAL  Final  Surgical pcr screen     Status: None   Collection Time: 02/16/19  8:46 AM   Specimen: Nasal Mucosa; Nasal Swab  Result Value Ref Range Status   MRSA, PCR NEGATIVE NEGATIVE Final   Staphylococcus aureus NEGATIVE NEGATIVE Final    Comment: (NOTE) The Xpert SA Assay (FDA approved for NASAL specimens in patients 96 years of age and older), is one component of a comprehensive surveillance program. It is not intended to diagnose infection nor to guide or monitor treatment. Performed at [facility], [place] [address] [place],  [address]          Radiology Studies: Ct Head Wo Contrast  Result Date: 02/18/2019 CLINICAL DATA:  Patient for VP shunt in OR found to have some blood mixed with CSF, rule out intracranial hemorrhage. EXAM: CT HEAD WITHOUT CONTRAST TECHNIQUE: Contiguous axial images were obtained from the base of the skull through the vertex without intravenous contrast. COMPARISON:  Brain MRI 02/16/2019, head CT 02/16/2019. FINDINGS: Brain: There are external components of a new right-sided shunt system extending to a right parietal burr hole. There are multiple small foci of gas within the adjacent high right frontal lobe, however, no intracranial ventricular catheter is identified. The ventricular system is unchanged in size and configuration as compared to prior MR and CT 02/16/2019 without overt hydrocephalus. No significant acute intracranial hemorrhage. Grossly unchanged region of parenchymal hypodensity within the anterosuperior right frontal lobe extending to involve the anterior body/genu of the corpus callosum. 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 No midline shift or extra-axial fluid collection. Vascular: No hyperdense vessel. Skull: No  calvarial fracture or suspicious osseous lesion. Right parietal burr hole. Sinuses/Orbits: Visualized orbits demonstrate no acute abnormality. Mild to moderate ethmoid sinus mucosal thickening. Scattered mild mucosal thickening within remaining paranasal sinuses. No significant mastoid effusion Findings within impression 1 called by telephone at the time of interpretation on 02/18/2019 at 12:46 pm to provider KYLE CABBELL , who verbally acknowledged these results. Per Dr. Christella Noa, the intracranial component of the shunt was not left in place while in the OR, and would not be expected to be seen. IMPRESSION: 1. The external components of a new right-sided shunt system extend to a right parietal burr hole. There are multiple small  foci of gas within the adjacent high right frontal lobe, although no intracranial ventricular catheter is present. No significant acute intracranial hemorrhage. No overt hydrocephalus. 2. Grossly unchanged region of hypodensity within the anterosuperior right frontal lobe and callosal anterior body/genu consistent with previously demonstrated cerebritis. Electronically Signed   By: Kellie Simmering   On: 02/18/2019 12:51        Scheduled Meds:  acyclovir  400 mg Oral BID   Chlorhexidine Gluconate Cloth  6 each Topical Daily   flucytosine  25 mg/kg Oral Q6H   levETIRAcetam  500 mg Oral Q12H   loratadine  10 mg Oral Daily   metoprolol tartrate  12.5 mg Oral BID   pantoprazole  40 mg Oral Q supper   potassium chloride  40 mEq Oral BID   sodium chloride  1,000 mL Intravenous Q24H   sodium chloride  500 mL Intravenous Q24H   Continuous Infusions:  sodium chloride 75 mL/hr at 02/19/19 0800   amphotericin  B  Liposome (AMBISOME) ADULT IV Stopped (02/18/19 1958)     LOS: 12 days     Georgette Shell, MD [facility]  If 7PM-7AM, please contact night-coverage [email or web address] Password [email or web address] 02/19/2019, 1:29 PM

## 2019-02-19 NOTE — Progress Notes (Signed)
[facility] for Infectious Disease   Reason for visit: Follow up on cryptococcal meningitis  Interval History: s/p VP shunt yesterday by Dr. Christella Noa.  Asking about going home. Remains afebrile.  No associated n/v/rash.   Physical Exam: Constitutional:  Vitals:   02/19/19 1000 02/19/19 1100  BP: 120/80 107/63  Pulse: 95 95  Resp: (!) 26 18  Temp:    SpO2: 94% 97%   patient appears in NAD Eyes: anicteric HENT: incision sites with no erythema Respiratory: Normal respiratory effort; CTA B Cardiovascular: RRR GI: soft, nt, nd  Review of Systems: Constitutional: negative for fevers, chills and anorexia Gastrointestinal: negative for nausea and diarrhea Integument/breast: negative for rash  Lab Results  Component Value Date   WBC 11.0 (H) 02/19/2019   HGB 12.7 (L) 02/19/2019   HCT 38.7 (L) 02/19/2019   MCV 83.9 02/19/2019   PLT 96 (L) 02/19/2019    Lab Results  Component Value Date   CREATININE 1.21 02/19/2019   BUN 8 02/19/2019   NA 141 02/19/2019   K 4.1 02/19/2019   CL 108 02/19/2019   CO2 23 02/19/2019    Lab Results  Component Value Date   ALT 19 02/19/2019   AST 12 (L) 02/19/2019   ALKPHOS 41 02/19/2019     Microbiology: Recent Results (from the past 240 hour(s))  CSF culture     Status: None   Collection Time: 02/09/19 11:36 AM   Specimen: PATH Cytology CSF; Cerebrospinal Fluid  Result Value Ref Range Status   Specimen Description CSF  Final   Special Requests NONE  Final   Gram Stain   Final    WBC PRESENT,BOTH PMN AND MONONUCLEAR NO ORGANISMS SEEN CYTOSPIN SMEAR    Culture   Final    NO GROWTH 3 DAYS Performed at [facility], [address] [place], [place] [address]    Report Status 02/13/2019 FINAL  Final  CSF culture with Stat gram stain     Status: None   Collection Time: 02/11/19 11:32 AM   Specimen: CSF; Cerebrospinal Fluid  Result Value Ref Range Status   Specimen Description CSF  Final   Special Requests  Immunocompromised  Final   Gram Stain   Final    WBC PRESENT, PREDOMINANTLY MONONUCLEAR NO ORGANISMS SEEN CYTOSPIN SMEAR    Culture   Final    NO GROWTH Performed at [facility], [place] [address] [place], [place] [address]    Report Status 02/14/2019 FINAL  Final  CSF culture     Status: None   Collection Time: 02/12/19 11:14 AM   Specimen: PATH Cytology CSF; Cerebrospinal Fluid  Result Value Ref Range Status   Specimen Description CSF  Final   Special Requests NONE  Final   Gram Stain   Final    WBC PRESENT, PREDOMINANTLY MONONUCLEAR NO ORGANISMS SEEN CYTOSPIN SMEAR    Culture   Final    NO GROWTH 3 DAYS Performed at [facility], [place] [address], [place], [place] [address]    Report Status 02/15/2019 FINAL  Final  Fungus Culture With Stain     Status: None (Preliminary result)   Collection Time: 02/14/19  7:29 PM  Result Value Ref Range Status   Fungus Stain Final report  Final    Comment: (NOTE) Performed At: [facility], [place] [id number] Rush Farmer MD [id number]    Fungus (Mycology) Culture PENDING  Incomplete   Fungal Source PENDING  Incomplete  Fungus Culture Result     Status: None   Collection Time: 02/14/19  7:29 PM  Result Value Ref Range Status   Result 1 Comment  Final    Comment: (NOTE) KOH/Calcofluor preparation:  no fungus observed. Performed At: [facility] [place], [place] [id number] Rush Farmer MD [id number]   CSF culture with Stat gram stain     Status: None   Collection Time: 02/14/19  7:53 PM   Specimen: CSF; Cerebrospinal Fluid  Result Value Ref Range Status   Specimen Description CSF  Final   Special Requests Normal  Final   Gram Stain   Final    WBC PRESENT, PREDOMINANTLY PMN NO ORGANISMS SEEN CYTOSPIN SMEAR    Culture   Final    NO GROWTH 3 DAYS Performed at [facility], [address] [place], [place] [address]    Report Status 02/18/2019  FINAL  Final  Surgical pcr screen     Status: None   Collection Time: 02/16/19  8:46 AM   Specimen: Nasal Mucosa; Nasal Swab  Result Value Ref Range Status   MRSA, PCR NEGATIVE NEGATIVE Final   Staphylococcus aureus NEGATIVE NEGATIVE Final    Comment: (NOTE) The Xpert SA Assay (FDA approved for NASAL specimens in patients 57 years of age and older), is one component of a comprehensive surveillance program. It is not intended to diagnose infection nor to guide or monitor treatment. Performed at [facility], [place] [address] [place], [place] [address]     Impression/Plan:  1. Cryptococcal meningitis - on Ambisome and flucytosine and tolerating.  No changes  2.  Medication monitoring - creat remains wnl, magnesium good.    3.  Hydrocephalus - from #1 and appreciate management by Dr. Christella Noa with [facility].  Tinnitus is stable, no headache or vision issues this am.

## 2019-02-19 NOTE — Anesthesia Postprocedure Evaluation (Signed)
Anesthesia Post Note  [name]  Procedure(s) Performed: SHUNT INSERTION VENTRICULAR-PERITONEAL (Right Head)     Patient location during evaluation: PACU Anesthesia Type: General Level of consciousness: awake and alert Pain management: pain level controlled Vital Signs Assessment: post-procedure vital signs reviewed and stable Respiratory status: spontaneous breathing, nonlabored ventilation, respiratory function stable and patient connected to nasal cannula oxygen Cardiovascular status: blood pressure returned to baseline and stable Postop Assessment: no apparent nausea or vomiting Anesthetic complications: no    Last Vitals:  Vitals:   02/19/19 0700 02/19/19 0800  BP: 137/89 121/90  Pulse: 98 (!) 103  Resp: 17 (!) 25  Temp:  37.1 C  SpO2: 98% 96%    Last Pain:  Vitals:   02/19/19 0800  TempSrc: Oral  PainSc:                  Jamaine Quintin DAVID

## 2019-02-19 NOTE — Progress Notes (Signed)
Patient transferred from 4N via bed. Assessment as charted. Patient in no pain. Ambulated to bathroom to urinate but did not save.

## 2019-02-20 LAB — CBC
HCT: 37.7 % — ABNORMAL LOW (ref 39.0–52.0)
Hemoglobin: 12.3 g/dL — ABNORMAL LOW (ref 13.0–17.0)
MCH: 27.5 pg (ref 26.0–34.0)
MCHC: 32.6 g/dL (ref 30.0–36.0)
MCV: 84.3 fL (ref 80.0–100.0)
Platelets: 101 10*3/uL — ABNORMAL LOW (ref 150–400)
RBC: 4.47 MIL/uL (ref 4.22–5.81)
RDW: 13.5 % (ref 11.5–15.5)
WBC: 9.6 10*3/uL (ref 4.0–10.5)
nRBC: 0 % (ref 0.0–0.2)

## 2019-02-20 LAB — BASIC METABOLIC PANEL
Anion gap: 9 (ref 5–15)
BUN: 7 mg/dL (ref 6–20)
CO2: 24 mmol/L (ref 22–32)
Calcium: 8.8 mg/dL — ABNORMAL LOW (ref 8.9–10.3)
Chloride: 109 mmol/L (ref 98–111)
Creatinine, Ser: 1.1 mg/dL (ref 0.61–1.24)
GFR calc Af Amer: >60 mL/min (ref >60)
GFR calc non Af Amer: >60 mL/min (ref >60)
Glucose, Bld: 116 mg/dL — ABNORMAL HIGH (ref 70–99)
Potassium: 3.6 mmol/L (ref 3.5–5.1)
Sodium: 142 mmol/L (ref 135–145)

## 2019-02-20 LAB — MAGNESIUM: Magnesium: 1.9 mg/dL (ref 1.7–2.4)

## 2019-02-20 MED ORDER — POTASSIUM CHLORIDE CRYS ER 20 MEQ PO TBCR
40.0000 meq | EXTENDED_RELEASE_TABLET | Freq: Three times a day (TID) | ORAL | Status: DC
  Administered 2019-02-20 – 2019-02-24 (×14): 40 meq via ORAL
  Filled 2019-02-20 (×15): qty 2

## 2019-02-20 NOTE — Progress Notes (Signed)
PROGRESS NOTE    Author Slaven  [id number] DOB: [birth year] DOA: 02/07/2019 PCP: [name], MD    Brief Narrative:48 year old male with history of CLL, hypertension, diabetes type 2 who presented with headache, diplopia. He underwent work-up for headache as an outpatient. He was seen by neurology and underwent lumbar puncture under fluoroscopy and was found to have elevated opening pressure of 55. He noted immediate improvement in headache after lumbar puncture. Cryptococcal antigen titers came out to be positive at 1: 64 and patient was sent to the emergency department. ID consulted after admission. Started on antibiotics. His symptoms of headache and diplopia have significantly improved. Hospital course remarkable for persistent elevated opening pressure so he has been undergoing series of lumbar punctures. Plan for another puncture lumbar tomorrow.  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  Continue amphotericin and flucytosine.  Will discuss with ID tomorrow regarding discharge plans.ID recommends 4 weeks of IV therapy before transitioning to oral fluconazole.MRI 02/16/2019-abnormal leptomeningeal enhancement which is new and consistent with meningitis. 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 No formed abscess.  Slightly elevated today compared to yesterday.  Pharmacy following and  monitoring renal functions potassium and magnesium while he is on amphotericin.   Patient has a PICC line in the left upper extremity.  #2 history of CLL chemotherapy on hold.  #3 hypertension continue metoprolol.Blood pressure stable.    Estimated body mass index is 29.76 kg/m as calculated from the following:   Height as of this encounter: 5\' 10"  (1.778 m).   Weight as of this encounter: 94.1 kg.  DVT prophylaxis:SCD  code Status:Full code  family Communication:None at bedside  disposition Plan:Pending clinical improvement patient is currently on IV antibiotics  Consultants:  Neurosurgery and ID  Procedures:Lumbar punctureswith patient 02/18/2019 antimicrobials:Amphotericin B, acyclovir, flucytosine     Subjective:  Patient anxious to go home wife anxious to get him home he denies any new complaints no headache tinnitus nausea vomiting Objective: Vitals:   02/19/19 2100 02/19/19 2153 02/20/19 0441 02/20/19 0909  BP: 134/75 (!) 148/90 133/80 121/85  Pulse: 97 91 83 (!) 101  Resp:  20 16 18   Temp:  100.3 F (37.9 C) 98.7 F (37.1 C) 97.8 F (36.6 C)  TempSrc:  Oral Oral Oral  SpO2: 95% 100% 97% 96%  Weight:   94.1 kg   Height:        Intake/Output Summary (Last 24 hours) at 02/20/2019 1452 Last data filed at 02/20/2019 0700 Gross per 24 hour  Intake 860.03 ml  Output 825 ml  Net 35.03 ml   Filed Weights   02/15/19 0514 02/18/19 0659 02/20/19 0441  Weight: 92.4 kg 92.4 kg 94.1 kg    Examination: Upper extremity PICC line General exam: Appears calm and comfortable  Respiratory system: Clear to auscultation. Respiratory effort normal. Cardiovascular system: S1 & S2 heard, RRR. No JVD, murmurs, rubs, gallops or clicks. No pedal edema. Gastrointestinal system: Abdomen is nondistended, soft and nontender.  No organomegaly or masses felt. Normal bowel sounds heard. Central nervous system: Alert and oriented. No focal neurological  deficits. Extremities: Symmetric 5 x 5 power. Skin: No rashes, lesions or ulcers Psychiatry: Judgement and insight appear normal. Mood & affect appropriate.     Data Reviewed: I have personally reviewed following labs and imaging studies  CBC: Recent Labs  Lab 02/16/19 0359 02/17/19 0328 02/18/19 0454 02/19/19 0422 02/20/19 0345  WBC 10.5 10.9* 9.2 11.0* 9.6  HGB 14.6 14.4 14.2 12.7* 12.3*  HCT 43.4 43.5 41.8 38.7* 37.7*  MCV 83.0 82.9 82.9 83.9 84.3  PLT 142* 120* 105* 96* [id number]*   Basic Metabolic Panel: Recent Labs  Lab 02/16/19 0359 02/17/19 0328 02/18/19 0454 02/19/19 0422 02/20/19 0345  NA 139 141 142 141 142  K 3.9 4.0 4.1 4.1 3.6  CL 108 107 110 108 109  CO2 22 23 22 23 24   GLUCOSE 109* 98 104* 115* 116*  BUN 8 10 7 8 7   CREATININE 1.05 1.11 1.12 1.21 1.10  CALCIUM 9.0 9.1 9.0 8.8* 8.8*  MG 1.9 2.0 1.8 1.9 1.9   GFR: Estimated Creatinine Clearance: 96.6 mL/min (by C-G formula based on SCr of 1.1 mg/dL). Liver Function Tests: Recent Labs  Lab 02/19/19 0422  AST 12*  ALT 19  ALKPHOS 41  BILITOT 0.7  PROT 4.7*  ALBUMIN 3.0*   No results for input(s): LIPASE, AMYLASE in the last 168 hours. No results for input(s): AMMONIA in the last 168 hours. Coagulation Profile: Recent Labs  Lab 02/16/19 0825  INR 1.1   Cardiac Enzymes: No results for input(s): CKTOTAL, CKMB, CKMBINDEX, TROPONINI in the last 168 hours. BNP (last 3 results) No results for input(s): PROBNP in the last 8760 hours. HbA1C: No results for input(s): HGBA1C in the last 72 hours. CBG: Recent Labs  Lab 02/18/19 1155  GLUCAP 118*   Lipid Profile: No results for input(s): CHOL, HDL, LDLCALC, TRIG, CHOLHDL, LDLDIRECT in the last 72 hours. Thyroid Function Tests: No results for input(s): TSH, T4TOTAL, FREET4, T3FREE, THYROIDAB in the last 72 hours. Anemia Panel: No results for input(s): VITAMINB12, FOLATE, FERRITIN, TIBC, IRON, RETICCTPCT in the last 72 hours. Sepsis Labs: No  results for input(s): PROCALCITON, LATICACIDVEN in the last 168 hours.  Recent Results (from the past 240 hour(s))  CSF culture with Stat gram stain     Status: None   Collection Time: 02/11/19 11:32 AM   Specimen: CSF; Cerebrospinal Fluid  Result Value Ref Range Status   Specimen Description CSF  Final   Special Requests Immunocompromised  Final   Gram Stain   Final    WBC PRESENT, PREDOMINANTLY MONONUCLEAR NO ORGANISMS SEEN CYTOSPIN SMEAR    Culture   Final    NO GROWTH Performed at [facility], [address], [place], [place] [address]    Report Status 02/14/2019 FINAL  Final  CSF culture     Status: None   Collection Time: 02/12/19 11:14 AM   Specimen: PATH Cytology CSF; Cerebrospinal Fluid  Result Value Ref Range Status   Specimen Description CSF  Final   Special Requests NONE  Final   Gram Stain   Final    WBC PRESENT, PREDOMINANTLY MONONUCLEAR NO ORGANISMS SEEN CYTOSPIN SMEAR    Culture   Final    NO GROWTH 3 DAYS Performed at [facility], [place] [address], [place], [place] [address]    Report Status 02/15/2019 FINAL  Final  Fungus Culture With Stain  Status: None (Preliminary result)   Collection Time: 02/14/19  7:29 PM  Result Value Ref Range Status   Fungus Stain Final report  Final    Comment: (NOTE) Performed At: [facility] [place], [place] [id number] Rush Farmer MD [id number]    Fungus (Mycology) Culture PENDING  Incomplete   Fungal Source PENDING  Incomplete  Fungus Culture Result     Status: None   Collection Time: 02/14/19  7:29 PM  Result Value Ref Range Status   Result 1 Comment  Final    Comment: (NOTE) KOH/Calcofluor preparation:  no fungus observed. Performed At: [facility] [place], [place] [id number] Rush Farmer MD [id number]   CSF culture with Stat gram stain     Status: None   Collection Time: 02/14/19  7:53 PM   Specimen: CSF; Cerebrospinal Fluid  Result  Value Ref Range Status   Specimen Description CSF  Final   Special Requests Normal  Final   Gram Stain   Final    WBC PRESENT, PREDOMINANTLY PMN NO ORGANISMS SEEN CYTOSPIN SMEAR    Culture   Final    NO GROWTH 3 DAYS Performed at [facility], [address] [place] [address], [place], [place] [address]    Report Status 02/18/2019 FINAL  Final  Surgical pcr screen     Status: None   Collection Time: 02/16/19  8:46 AM   Specimen: Nasal Mucosa; Nasal Swab  Result Value Ref Range Status   MRSA, PCR NEGATIVE NEGATIVE Final   Staphylococcus aureus NEGATIVE NEGATIVE Final    Comment: (NOTE) The Xpert SA Assay (FDA approved for NASAL specimens in patients 98 years of age and older), is one component of a comprehensive surveillance program. It is not intended to diagnose infection nor to guide or monitor treatment. Performed at [facility], [place] [address], [place],  [address]          Radiology Studies: No results found.      Scheduled Meds:  acyclovir  400 mg Oral BID   Chlorhexidine Gluconate Cloth  6 each Topical Daily   flucytosine  25 mg/kg Oral Q6H   levETIRAcetam  500 mg Oral Q12H   loratadine  10 mg Oral Daily   metoprolol tartrate  12.5 mg Oral BID   pantoprazole  40 mg Oral Q supper   potassium chloride  40 mEq Oral BID   sodium chloride  1,000 mL Intravenous Q24H   sodium chloride  500 mL Intravenous Q24H   Continuous Infusions:  amphotericin  B  Liposome (AMBISOME) ADULT IV Stopped (02/19/19 2011)     LOS: 13 days     Georgette Shell, MD [facility]  If 7PM-7AM, please contact night-coverage [email or web address] Password [email or web address] 02/20/2019, 2:52 PM

## 2019-02-20 NOTE — Progress Notes (Signed)
Pharmacy Antibiotic Note  David Whitehead is a 48 y.o. 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 Pharmacy has been consulted for amphotericin B liposome dosing. Ambisome 3 mg/kg and flucytosine 25 mg/kg started in this patient with suspected cryptococcal meningitis. NS 51mL ordered to be given an hour before and an hour after the Ambisome infusion with D5 flushes administered inbetween. The pharmacy team will monitor the patients renal function, potassium and magnesium and make adjustments per protocol.  Potassium today is down at 3.6 from 4.1 and magnesium is 1.9. He is on K 40 mEq BID and has not needed magnesium replacement yet. We will increase his K frequency to TID.   SCr is stable at 1.10. Will watch renal function closely and continue fluids to 1 L pre Ambisome dose and 500 mL post.   Plan: Continue Ambisome 250 mg q24h (3 mg/kg) Continue Flucytosine 2,500 mg q6h (25 mg/kg) Increase potassium to 40 mEq TID  Continue to replace Mg and K as needed Continue pre-dose fluid at 1000 mL and 500 mL post-dose Monitor Scr and CBC   Height: 5\' 10"  (177.8 cm) Weight: 207 lb 6.4 oz (94.1 kg) IBW/kg (Calculated) : 73  Temp (24hrs), Avg:99.1 F (37.3 C), Min:97.8 F (36.6 C), Max:100.3 F (37.9 C)  Recent Labs  Lab 02/16/19 0359 02/17/19 0328 02/18/19 0454 02/19/19 0422 02/20/19 0345  WBC 10.5 10.9* 9.2 11.0* 9.6  CREATININE 1.05 1.11 1.12 1.21 1.10    Estimated Creatinine Clearance: 96.6 mL/min (by C-G formula based on SCr of 1.1 mg/dL).    Allergies  Allergen Reactions  . Bactrim [Sulfamethoxazole-Trimethoprim] Rash  . Latex Other (See Comments)    Took skin off when removed tape  . Amoxicillin Rash    Has patient had a PCN reaction causing immediate rash, facial/tongue/throat swelling, SOB or lightheadedness with hypotension: No Has patient had a PCN reaction causing severe rash involving mucus membranes or skin necrosis: No Has patient had a PCN reaction  that required hospitalization: No Has patient had a PCN reaction occurring within the last 10 years: No If all of the above answers are "NO", then may proceed with Cephalosporin use.     Antimicrobials this admission: Ambisome  9/28>> Flucytosine 9/28>>  Microbiology results: 9/28 BCx: No growth final  9/30; 10/2; 10/3 CSF culture: NGTD   Thank you for allowing pharmacy to be a part of this patient's care.  Nicoletta Dress, PharmD PGY2 Infectious Disease Pharmacy Resident  02/20/2019 2:57 PM

## 2019-02-20 NOTE — Plan of Care (Signed)
  Problem: Health Behavior/Discharge Planning: Goal: Ability to manage health-related needs will improve Outcome: Progressing   Problem: Clinical Measurements: Goal: Will remain free from infection Outcome: Progressing   Problem: Coping: Goal: Level of anxiety will decrease Outcome: Progressing

## 2019-02-20 NOTE — Progress Notes (Signed)
Patient ID: David Whitehead, male   DOB: 1971-01-10, 48 y.o.   MRN: [id number] BP 121/85 (BP Location: Right Arm)   Pulse (!) 101   Temp 97.8 F (36.6 C) (Oral)   Resp 18   Ht 5\' 10"  (1.778 m)   Wt 94.1 kg   SpO2 96%   BMI 29.76 kg/m  Alert and oriented x 4 Perrl, no diplopia No headaches Doing well 5/5 strength in all extremities Wounds  Clean and dry

## 2019-02-21 DIAGNOSIS — G0490 Encephalitis and encephalomyelitis, unspecified: Secondary | ICD-10-CM

## 2019-02-21 LAB — CBC
HCT: 39.7 % (ref 39.0–52.0)
Hemoglobin: 12.9 g/dL — ABNORMAL LOW (ref 13.0–17.0)
MCH: 27.4 pg (ref 26.0–34.0)
MCHC: 32.5 g/dL (ref 30.0–36.0)
MCV: 84.3 fL (ref 80.0–100.0)
Platelets: 102 10*3/uL — ABNORMAL LOW (ref 150–400)
RBC: 4.71 MIL/uL (ref 4.22–5.81)
RDW: 13.3 % (ref 11.5–15.5)
WBC: 10.9 10*3/uL — ABNORMAL HIGH (ref 4.0–10.5)
nRBC: 0 % (ref 0.0–0.2)

## 2019-02-21 LAB — BASIC METABOLIC PANEL
Anion gap: 9 (ref 5–15)
BUN: 6 mg/dL (ref 6–20)
CO2: 23 mmol/L (ref 22–32)
Calcium: 8.5 mg/dL — ABNORMAL LOW (ref 8.9–10.3)
Chloride: 109 mmol/L (ref 98–111)
Creatinine, Ser: 1.2 mg/dL (ref 0.61–1.24)
GFR calc Af Amer: >60 mL/min (ref >60)
GFR calc non Af Amer: >60 mL/min (ref >60)
Glucose, Bld: 116 mg/dL — ABNORMAL HIGH (ref 70–99)
Potassium: 4.1 mmol/L (ref 3.5–5.1)
Sodium: 141 mmol/L (ref 135–145)

## 2019-02-21 LAB — MAGNESIUM: Magnesium: 1.7 mg/dL (ref 1.7–2.4)

## 2019-02-21 MED FILL — Thrombin For Soln 5000 Unit: CUTANEOUS | Qty: 5000 | Status: AC

## 2019-02-21 NOTE — Progress Notes (Signed)
Pharmacy Antibiotic Note  David Whitehead is a 48 y.o. 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 Pharmacy has been consulted for amphotericin B liposome dosing. Ambisome 3 mg/kg and flucytosine 25 mg/kg started in this patient with suspected cryptococcal meningitis. NS 551mL ordered to be given an hour before and an hour after the Ambisome infusion with D5 flushes administered inbetween. The pharmacy team will monitor the patients renal function, potassium and magnesium and make adjustments per protocol.  Potassium today is stable at 4.1 and magnesium is 1.7. He is on K 40 mEq TID and has not needed magnesium replacement yet. His magnesium is slightly down today at 1.7, will not replace today but if it continues to drop tomorrow will replace with 2g of Mg.  SCr is stable at 1.20. Will watch renal function closely and continue fluids to 1 L pre Ambisome dose and 500 mL post.   Plan: Continue Ambisome 250 mg q24h (3 mg/kg) Continue Flucytosine 2,500 mg q6h (25 mg/kg) Continue potassium to 40 mEq TID  Continue to replace Mg and K as needed Continue pre-dose fluid at 1000 mL and 500 mL post-dose Monitor Scr and CBC   Height: 5\' 10"  (177.8 cm) Weight: 207 lb 6.4 oz (94.1 kg) IBW/kg (Calculated) : 73  Temp (24hrs), Avg:99.5 F (37.5 C), Min:98.7 F (37.1 C), Max:100.4 F (38 C)  Recent Labs  Lab 02/17/19 0328 02/18/19 0454 02/19/19 0422 02/20/19 0345 02/21/19 0353  WBC 10.9* 9.2 11.0* 9.6 10.9*  CREATININE 1.11 1.12 1.21 1.10 1.20    Estimated Creatinine Clearance: 88.6 mL/min (by C-G formula based on SCr of 1.2 mg/dL).    Allergies  Allergen Reactions  . Bactrim [Sulfamethoxazole-Trimethoprim] Rash  . Latex Other (See Comments)    Took skin off when removed tape  . Amoxicillin Rash    Has patient had a PCN reaction causing immediate rash, facial/tongue/throat swelling, SOB or lightheadedness with hypotension: No Has patient had a PCN reaction causing severe  rash involving mucus membranes or skin necrosis: No Has patient had a PCN reaction that required hospitalization: No Has patient had a PCN reaction occurring within the last 10 years: No If all of the above answers are "NO", then may proceed with Cephalosporin use.     Antimicrobials this admission: Ambisome  9/28>> Flucytosine 9/28>>  Microbiology results: 9/28 BCx: No growth final  9/30; 10/2; 10/3 CSF culture: NGTD   Thank you for allowing pharmacy to be a part of this patient's care.  Nicoletta Dress, PharmD PGY2 Infectious Disease Pharmacy Resident  02/21/2019 3:18 PM

## 2019-02-21 NOTE — Progress Notes (Signed)
PROGRESS NOTE    David Whitehead  [id number] DOB: 1970/10/06 DOA: 02/07/2019 PCP: [name], MD    Brief Narrative: 48 year old male with history of CLL, hypertension, diabetes type 2 who presented with headache, diplopia. He underwent work-up for headache as an outpatient. He was seen by neurology and underwent lumbar puncture under fluoroscopy and was found to have elevated opening pressure of 55. He noted immediate improvement in headache after lumbar puncture. Cryptococcal antigen titers came out to be positive at 1: 64 and patient was sent to the emergency department. ID consulted after admission. Started on antibiotics. His symptoms of headache and diplopia have significantly improved. Hospital course remarkable for persistent elevated opening pressure so he has been undergoing series of lumbar punctures. Plan for another puncture lumbar tomorrow.  02/17/2019 patient resting in bed concerned about the findings and MRI complains of a mild headache and tinnitus  02/19/2019 patient awake alert resting in bed no new complaints slept well anxious to go home  02/21/2019 patient resting in bed no complaints of headache changes with respiration or tinnitus he is very anxious to go home discussed with his wife   Assessment & Plan:   Principal Problem:   Cryptococcal meningitis ([place]) Active Problems:   CLL (chronic lymphocytic leukemia) ([place])   Pulmonary nodule   Hypertension   Brain lesion   Meningitis, cryptococcal ([place])   #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   Continue amphotericin and flucytosine and acyclovir.  ID following and recommends a total of 4 weeks of IV therapy before transitioning to oral fluconazole.  MRI of the brain on 02/16/2019 shows abnormal leptomeningeal enhancement and cerebritis.   Patient followed by pharmacy for following renal functions as well as electrolytes while on IV antifungal therapy.  Pharmacy following and monitoring renal functions potassium and magnesium while he is on amphotericin.  Patient has a PICC line in the left upper extremity.  #2 history of CLL chemotherapy on hold.  #3 hypertension he stopped 108/ 73 hold metoprolol.    Estimated body mass index is 29.76 kg/m as calculated from the following:   Height as of this encounter: 5\' 10"  (1.778 m).   Weight as of this encounter: 94.1 kg.  DVT prophylaxis:SCD  code Status:Full code  family Communication:None at bedside  disposition Plan:Pending clinical improvement patient is currently on IV antibiotics  Consultants:  Neurosurgery and ID  Procedures:Lumbar punctureswith patient 02/18/2019 antimicrobials:Amphotericin B, acyclovir, flucytosine  Subjective: No new complaints anxious to go home  Objective: Vitals:   02/20/19 2015 02/21/19 0508 02/21/19 0833 02/21/19 1152  BP: 120/78 112/69 122/82 108/73  Pulse: (!) 101 (!) 103 (!) 104 77  Resp: 18 20 18 18   Temp: 99.6 F (37.6 C) 99.9 F (37.7 C) (!) 100.4 F (38 C) 98.8 F (37.1 C)  TempSrc: Oral Oral Oral Oral  SpO2: 98% 97% 99% 99%  Weight:      Height:        Intake/Output Summary (Last 24 hours) at 02/21/2019 1517 Last data filed at 02/21/2019 0600 Gross per 24 hour  Intake 1488.2 ml  Output 0 ml  Net 1488.2 ml   Filed Weights   02/15/19 0514 02/18/19 0659 02/20/19 0441  Weight: 92.4 kg 92.4 kg 94.1 kg    Examination:  General exam: Appears calm and comfortable  Respiratory system: Clear to auscultation. Respiratory effort normal. Cardiovascular system: S1 & S2 heard, RRR. No JVD, murmurs, rubs, gallops or clicks. No pedal edema. Gastrointestinal system: Abdomen  is nondistended, soft and nontender. No organomegaly or masses felt. Normal bowel sounds heard. Central nervous system: Alert and oriented. No focal neurological deficits. Extremities: Symmetric 5 x 5 power. Skin: No rashes, lesions or ulcers Psychiatry:  Judgement and insight appear normal. Mood & affect appropriate.     Data Reviewed: I have personally reviewed following labs and imaging studies  CBC: Recent Labs  Lab 02/17/19 0328 02/18/19 0454 02/19/19 0422 02/20/19 0345 02/21/19 0353  WBC 10.9* 9.2 11.0* 9.6 10.9*  HGB 14.4 14.2 12.7* 12.3* 12.9*  HCT 43.5 41.8 38.7* 37.7* 39.7  MCV 82.9 82.9 83.9 84.3 84.3  PLT 120* 105* 96* 101* [id number]*   Basic Metabolic Panel: Recent Labs  Lab 02/17/19 0328 02/18/19 0454 02/19/19 0422 02/20/19 0345 02/21/19 0353  NA 141 142 141 142 141  K 4.0 4.1 4.1 3.6 4.1  CL 107 110 108 109 109  CO2 23 22 23 24 23   GLUCOSE 98 104* 115* 116* 116*  BUN 10 7 8 7 6   CREATININE 1.11 1.12 1.21 1.10 1.20  CALCIUM 9.1 9.0 8.8* 8.8* 8.5*  MG 2.0 1.8 1.9 1.9 1.7   GFR: Estimated Creatinine Clearance: 88.6 mL/min (by C-G formula based on SCr of 1.2 mg/dL). Liver Function Tests: Recent Labs  Lab 02/19/19 0422  AST 12*  ALT 19  ALKPHOS 41  BILITOT 0.7  PROT 4.7*  ALBUMIN 3.0*   No results for input(s): LIPASE, AMYLASE in the last 168 hours. No results for input(s): AMMONIA in the last 168 hours. Coagulation Profile: Recent Labs  Lab 02/16/19 0825  INR 1.1   Cardiac Enzymes: No results for input(s): CKTOTAL, CKMB, CKMBINDEX, TROPONINI in the last 168 hours. BNP (last 3 results) No results for input(s): PROBNP in the last 8760 hours. HbA1C: No results for input(s): HGBA1C in the last 72 hours. CBG: Recent Labs  Lab 02/18/19 1155  GLUCAP 118*   Lipid Profile: No results for input(s): CHOL, HDL, LDLCALC, TRIG, CHOLHDL, LDLDIRECT in the last 72 hours. Thyroid Function Tests: No results for input(s): TSH, T4TOTAL, FREET4, T3FREE, THYROIDAB in the last 72 hours. Anemia Panel: No results for input(s): VITAMINB12, FOLATE, FERRITIN, TIBC, IRON, RETICCTPCT in the last 72 hours. Sepsis Labs: No results for input(s): PROCALCITON, LATICACIDVEN in the last 168 hours.  Recent Results (from  the past 240 hour(s))  CSF culture     Status: None   Collection Time: 02/12/19 11:14 AM   Specimen: PATH Cytology CSF; Cerebrospinal Fluid  Result Value Ref Range Status   Specimen Description CSF  Final   Special Requests NONE  Final   Gram Stain   Final    WBC PRESENT, PREDOMINANTLY MONONUCLEAR NO ORGANISMS SEEN CYTOSPIN SMEAR    Culture   Final    NO GROWTH 3 DAYS Performed at [facility], [address] [place], [place] [address]    Report Status 02/15/2019 FINAL  Final  Fungus Culture With Stain     Status: None (Preliminary result)   Collection Time: 02/14/19  7:29 PM  Result Value Ref Range Status   Fungus Stain Final report  Final    Comment: (NOTE) Performed At: [facility] [place], [place] [id number] Rush Farmer MD [id number]    Fungus (Mycology) Culture PENDING  Incomplete   Fungal Source PENDING  Incomplete  Fungus Culture Result     Status: None   Collection Time: 02/14/19  7:29 PM  Result Value Ref Range Status   Result 1 Comment  Final    Comment: (NOTE) KOH/Calcofluor preparation:  no fungus observed. Performed At: [facility] [place], [place] [id number] Rush Farmer MD [id number]   CSF culture with Stat gram stain     Status: None   Collection Time: 02/14/19  7:53 PM   Specimen: CSF; Cerebrospinal Fluid  Result Value Ref Range Status   Specimen Description CSF  Final   Special Requests Normal  Final   Gram Stain   Final    WBC PRESENT, PREDOMINANTLY PMN NO ORGANISMS SEEN CYTOSPIN SMEAR    Culture   Final    NO GROWTH 3 DAYS Performed at [facility], [address], [place], [place] [address]    Report Status 02/18/2019 FINAL  Final  Surgical pcr screen     Status: None   Collection Time: 02/16/19  8:46 AM   Specimen: Nasal Mucosa; Nasal Swab  Result Value Ref Range Status   MRSA, PCR NEGATIVE NEGATIVE Final   Staphylococcus aureus NEGATIVE NEGATIVE Final    Comment:  (NOTE) The Xpert SA Assay (FDA approved for NASAL specimens in patients 67 years of age and older), is one component of a comprehensive surveillance program. It is not intended to diagnose infection nor to guide or monitor treatment. Performed at [facility], [place] [address] [place], [place] [address]          Radiology Studies: No results found.      Scheduled Meds: . acyclovir  400 mg Oral BID  . Chlorhexidine Gluconate Cloth  6 each Topical Daily  . flucytosine  25 mg/kg Oral Q6H  . levETIRAcetam  500 mg Oral Q12H  . loratadine  10 mg Oral Daily  . metoprolol tartrate  12.5 mg Oral BID  . pantoprazole  40 mg Oral Q supper  . potassium chloride  40 mEq Oral TID  . sodium chloride  1,000 mL Intravenous Q24H  . sodium chloride  500 mL Intravenous Q24H   Continuous Infusions: . amphotericin  B  Liposome (AMBISOME) ADULT IV Stopped (02/20/19 1823)     LOS: 14 days     Georgette Shell, MD [facility]  If 7PM-7AM, please contact night-coverage [email or web address] Password [email or web address] 02/21/2019, 3:17 PM

## 2019-02-21 NOTE — Progress Notes (Signed)
   INFECTIOUS DISEASE ATTENDING ADDENDUM:   This patient has been seen and discussed with the house staff. Please see the resident's note for complete details. I have reviewed the pertinent  laboratory and radiographic data and examined the patient independently.  I concur with their findings with the following additions/corrections:   Patient appears much improved after placement of shunt.  He is interested in going home to complete his systemic antibiotic biotics.  I have a great deal of apprehension about risks of nephrotoxicity, potassium magnesium wasting on amphotericin and certainly bone marrow toxicity from his flucytosine.  I am not completely opposed to him going home with these therapies to complete a month of AmBisome and flucytosine but this would need to be done in the very closely monitored setting with frequent low blood draws i.e. every 2 days and aggressive hydration and repletion of his electrolytes.

## 2019-02-21 NOTE — Progress Notes (Signed)
Subjective: No new complaints. Pt feeling very well. Endorses improved tinnitus and no headaches since shunt placement.   Antibiotics:  Anti-infectives (From admission, onward)   Start     Dose/Rate Route Frequency Ordered Stop   02/07/19 2230  atovaquone (MEPRON) 750 MG/5ML suspension 750 mg  Status:  Discontinued     750 mg Oral Daily with breakfast 02/07/19 1729 02/10/19 1543   02/07/19 2200  acyclovir (ZOVIRAX) tablet 400 mg     400 mg Oral 2 times daily 02/07/19 1729     02/07/19 1800  flucytosine (ANCOBON) capsule 2,500 mg     25 mg/kg  95.7 kg Oral Every 6 hours 02/07/19 1528     02/07/19 1800  amphotericin B liposome (AMBISOME) 250 mg in dextrose 5 % 500 mL IVPB     3 mg/kg  82.1 kg (Adjusted) 250 mL/hr over 120 Minutes Intravenous Every 24 hours 02/07/19 1528        Medications: Scheduled Meds: . acyclovir  400 mg Oral BID  . Chlorhexidine Gluconate Cloth  6 each Topical Daily  . flucytosine  25 mg/kg Oral Q6H  . levETIRAcetam  500 mg Oral Q12H  . loratadine  10 mg Oral Daily  . metoprolol tartrate  12.5 mg Oral BID  . pantoprazole  40 mg Oral Q supper  . potassium chloride  40 mEq Oral TID  . sodium chloride  1,000 mL Intravenous Q24H  . sodium chloride  500 mL Intravenous Q24H   Continuous Infusions: . amphotericin  B  Liposome (AMBISOME) ADULT IV Stopped (02/20/19 1823)   PRN Meds:.acetaminophen, acetaminophen, diphenhydrAMINE **OR** diphenhydrAMINE, HYDROcodone-acetaminophen, meperidine (DEMEROL) injection, ondansetron **OR** ondansetron (ZOFRAN) IV, promethazine, sodium chloride flush  Objective:  Intake/Output Summary (Last 24 hours) at 02/21/2019 1410 Last data filed at 02/21/2019 0600 Gross per 24 hour  Intake 1488.2 ml  Output 0 ml  Net 1488.2 ml   Blood pressure 108/73, pulse 77, temperature 98.8 F (37.1 C), temperature source Oral, resp. rate 18, height 5\' 10"  (1.778 m), weight 94.1 kg, SpO2 99 %. Temp:  [98.7 F (37.1 C)-100.4 F  (38 C)] 98.8 F (37.1 C) (10/12 1152) Pulse Rate:  [77-104] 77 (10/12 1152) Resp:  [18-20] 18 (10/12 1152) BP: (108-138)/(69-82) 108/73 (10/12 1152) SpO2:  [97 %-100 %] 99 % (10/12 1152)  Physical Exam: General: Alert and awake, oriented x3, not in any acute distress. HEENT: anicteric sclera, EOMI CVS: regular rate, normal  Chest: no wheezing, no respiratory distress Abdomen: soft non-distended Extremities: no edema or deformity noted bilaterally Skin: no rashes Neuro: nonfocal  CBC: CBC Latest Ref Rng & Units 02/21/2019 02/20/2019 02/19/2019  WBC 4.0 - 10.5 K/uL 10.9(H) 9.6 11.0(H)  Hemoglobin 13.0 - 17.0 g/dL 12.9(L) 12.3(L) 12.7(L)  Hematocrit 39.0 - 52.0 % 39.7 37.7(L) 38.7(L)  Platelets 150 - 400 K/uL 102(L) 101(L) 96(L)   BMET Recent Labs    02/20/19 0345 02/21/19 0353  NA 142 141  K 3.6 4.1  CL 109 109  CO2 24 23  GLUCOSE 116* 116*  BUN 7 6  CREATININE 1.10 1.20  CALCIUM 8.8* 8.5*   Liver Panel Recent Labs    02/19/19 0422  PROT 4.7*  ALBUMIN 3.0*  AST 12*  ALT 19  ALKPHOS 41  BILITOT 0.7   Sedimentation Rate No results for input(s): ESRSEDRATE in the last 72 hours. C-Reactive Protein No results for input(s): CRP in the last 72 hours.  Micro Results: Recent Results (from the past 720  hour(s))  CSF culture     Status: Abnormal   Collection Time: 02/04/19  9:30 AM   Specimen: Lumbar Puncture; Cerebrospinal Fluid  Result Value Ref Range Status   MICRO NUMBER: [id number]  Final   SPECIMEN QUALITY: Adequate  Final   Source CEREBROSPINAL FLUID (CSF)  Final   STATUS: FINAL  Final   GRAM STAIN: (A)  Final    Many White blood cells seen No organisms seen Gram stain prepared by cytospin   ISOLATE 1: Cryptococcus neoformans (AA)  Final    Comment: Scant growth of Cryptococcus neoformans   MYCOBACTERIA, CULTURE, WITH FLUOROCHROME SMEAR     Status: None (Preliminary result)   Collection Time: 02/04/19  9:30 AM  Result Value Ref Range Status   MICRO  NUMBER: [id number]  Preliminary   SPECIMEN QUALITY: Adequate  Preliminary   Source: CEREBROSPINAL FLUID (CSF)  Preliminary   STATUS: PRELIMINARY  Preliminary   SMEAR: No acid fast bacilli seen.  Preliminary   RESULT:   Preliminary    Culture results to follow. Final reports of negative cultures can be expected in approximately six weeks. Positive cultures are reported immediately.  Culture, blood (Routine x 2)     Status: None   Collection Time: 02/07/19  1:50 PM   Specimen: BLOOD LEFT ARM  Result Value Ref Range Status   Specimen Description BLOOD LEFT ARM  Final   Special Requests   Final    BOTTLES DRAWN AEROBIC AND ANAEROBIC Blood Culture results may not be optimal due to an excessive volume of blood received in culture bottles   Culture   Final    NO GROWTH 5 DAYS Performed at [facility], [place] [address], [place], [place] [address]    Report Status 02/12/2019 FINAL  Final  Culture, blood (Routine x 2)     Status: None   Collection Time: 02/07/19  3:06 PM   Specimen: BLOOD RIGHT ARM  Result Value Ref Range Status   Specimen Description BLOOD RIGHT ARM  Final   Special Requests   Final    BOTTLES DRAWN AEROBIC AND ANAEROBIC Blood Culture results may not be optimal due to an excessive volume of blood received in culture bottles   Culture   Final    NO GROWTH 5 DAYS Performed at [facility], [place] [address], [place], [place] [address]    Report Status 02/12/2019 FINAL  Final  SARS CORONAVIRUS 2 (TAT 6-24 HRS) Nasopharyngeal Nasopharyngeal Swab     Status: None   Collection Time: 02/07/19  3:54 PM   Specimen: Nasopharyngeal Swab  Result Value Ref Range Status   SARS Coronavirus 2 NEGATIVE NEGATIVE Final    Comment: (NOTE) SARS-CoV-2 target nucleic acids are NOT DETECTED. The SARS-CoV-2 RNA is generally detectable in upper and lower respiratory specimens during the acute phase of infection. Negative results do not preclude SARS-CoV-2 infection, do not rule out  co-infections with other pathogens, and should not be used as the sole basis for treatment or other patient management decisions. Negative results must be combined with clinical observations, patient history, and epidemiological information. The expected result is Negative. Fact Sheet for Patients: SugarRoll.be Fact Sheet for Healthcare Providers: https://www.woods-mathews.com/ This test is not yet approved or cleared by the [place] FDA and  has been authorized for detection and/or diagnosis of SARS-CoV-2 by FDA under an Emergency Use Authorization (EUA). This EUA will remain  in effect (meaning this test can be used) for the duration of the  COVID-19 declaration under Section 56 4(b)(1) of the Act, 21 U.S.C. section 360bbb-3(b)(1), unless the authorization is terminated or revoked sooner. Performed at [facility], [place] [address], [place], [place] [address]   CSF culture     Status: None   Collection Time: 02/09/19 11:36 AM   Specimen: PATH Cytology CSF; Cerebrospinal Fluid  Result Value Ref Range Status   Specimen Description CSF  Final   Special Requests NONE  Final   Gram Stain   Final    WBC PRESENT,BOTH PMN AND MONONUCLEAR NO ORGANISMS SEEN CYTOSPIN SMEAR    Culture   Final    NO GROWTH 3 DAYS Performed at [facility], [place] [address], [place], [place] [address]    Report Status 02/13/2019 FINAL  Final  CSF culture with Stat gram stain     Status: None   Collection Time: 02/11/19 11:32 AM   Specimen: CSF; Cerebrospinal Fluid  Result Value Ref Range Status   Specimen Description CSF  Final   Special Requests Immunocompromised  Final   Gram Stain   Final    WBC PRESENT, PREDOMINANTLY MONONUCLEAR NO ORGANISMS SEEN CYTOSPIN SMEAR    Culture   Final    NO GROWTH Performed at [facility], [place] [address], [place], [place] [address]    Report Status 02/14/2019 FINAL  Final  CSF culture     Status: None    Collection Time: 02/12/19 11:14 AM   Specimen: PATH Cytology CSF; Cerebrospinal Fluid  Result Value Ref Range Status   Specimen Description CSF  Final   Special Requests NONE  Final   Gram Stain   Final    WBC PRESENT, PREDOMINANTLY MONONUCLEAR NO ORGANISMS SEEN CYTOSPIN SMEAR    Culture   Final    NO GROWTH 3 DAYS Performed at [facility], [place] [address] [place], [place] [address]    Report Status 02/15/2019 FINAL  Final  Fungus Culture With Stain     Status: None (Preliminary result)   Collection Time: 02/14/19  7:29 PM  Result Value Ref Range Status   Fungus Stain Final report  Final    Comment: (NOTE) Performed At: [facility] [place], [place] [id number] Rush Farmer MD [id number]    Fungus (Mycology) Culture PENDING  Incomplete   Fungal Source PENDING  Incomplete  Fungus Culture Result     Status: None   Collection Time: 02/14/19  7:29 PM  Result Value Ref Range Status   Result 1 Comment  Final    Comment: (NOTE) KOH/Calcofluor preparation:  no fungus observed. Performed At: [facility] [place], [place] [id number] Rush Farmer MD [id number]   CSF culture with Stat gram stain     Status: None   Collection Time: 02/14/19  7:53 PM   Specimen: CSF; Cerebrospinal Fluid  Result Value Ref Range Status   Specimen Description CSF  Final   Special Requests Normal  Final   Gram Stain   Final    WBC PRESENT, PREDOMINANTLY PMN NO ORGANISMS SEEN CYTOSPIN SMEAR    Culture   Final    NO GROWTH 3 DAYS Performed at [facility], [address], [place], [place] [address]    Report Status 02/18/2019 FINAL  Final  Surgical pcr screen     Status: None   Collection Time: 02/16/19  8:46 AM   Specimen: Nasal Mucosa; Nasal Swab  Result Value Ref Range Status   MRSA, PCR  NEGATIVE NEGATIVE Final   Staphylococcus aureus NEGATIVE NEGATIVE Final    Comment: (NOTE) The Xpert SA Assay (FDA approved for NASAL  specimens in patients 99 years of age and older), is one component of a comprehensive surveillance program. It is not intended to diagnose infection nor to guide or monitor treatment. Performed at [facility], [place] [address], [place], [place] [address]     Studies/Results: No results found.  Assessment/Plan:  INTERVAL HISTORY:  MRI on 10/7 with enhancement consistent with known meningitis and cerebritis. Shunt placed on 10/9.   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  Currently on flucytosine and L-ampho with pre/post hydration per pharm. Electrolytes and kidney function stable. Current potassium replacementregimen 54mEq TID.Has a PICC line placed in the L upper extremity.   Pt interesting in going home. This, while possible, is concerning due to the medication side effects and close monitoring needed for amphotericin and flucytosine. Discussed these risks with the pt and the coordination necessary to ensure appropriate and safe medication management and monitoring. He expressed his wishes to go home, though is willing to stay while outpatient arrangements are investigated.    Plan 1. Continue antifungals with pre/post hydration 2. Electrolyte replacement and monitoring per pharmacy protocol 3. Will reach out to pharm and home health regarding coordination of outpatient antifungal regimen     LOS: 14 days   Ladona Horns 02/21/2019, 2:10 PM

## 2019-02-22 ENCOUNTER — Encounter (HOSPITAL_COMMUNITY): Payer: Self-pay | Admitting: Neurosurgery

## 2019-02-22 LAB — CBC
HCT: 40.5 % (ref 39.0–52.0)
Hemoglobin: 13.1 g/dL (ref 13.0–17.0)
MCH: 27.1 pg (ref 26.0–34.0)
MCHC: 32.3 g/dL (ref 30.0–36.0)
MCV: 83.7 fL (ref 80.0–100.0)
Platelets: 117 10*3/uL — ABNORMAL LOW (ref 150–400)
RBC: 4.84 MIL/uL (ref 4.22–5.81)
RDW: 13.3 % (ref 11.5–15.5)
WBC: 10.9 10*3/uL — ABNORMAL HIGH (ref 4.0–10.5)
nRBC: 0 % (ref 0.0–0.2)

## 2019-02-22 LAB — BASIC METABOLIC PANEL
Anion gap: 11 (ref 5–15)
BUN: 7 mg/dL (ref 6–20)
CO2: 21 mmol/L — ABNORMAL LOW (ref 22–32)
Calcium: 8.7 mg/dL — ABNORMAL LOW (ref 8.9–10.3)
Chloride: 108 mmol/L (ref 98–111)
Creatinine, Ser: 1.16 mg/dL (ref 0.61–1.24)
GFR calc Af Amer: >60 mL/min (ref >60)
GFR calc non Af Amer: >60 mL/min (ref >60)
Glucose, Bld: 117 mg/dL — ABNORMAL HIGH (ref 70–99)
Potassium: 3.8 mmol/L (ref 3.5–5.1)
Sodium: 140 mmol/L (ref 135–145)

## 2019-02-22 LAB — MAGNESIUM: Magnesium: 1.6 mg/dL — ABNORMAL LOW (ref 1.7–2.4)

## 2019-02-22 MED ORDER — MAGNESIUM SULFATE 2 GM/50ML IV SOLN
2.0000 g | Freq: Once | INTRAVENOUS | Status: AC
  Administered 2019-02-22: 2 g via INTRAVENOUS
  Filled 2019-02-22: qty 50

## 2019-02-22 MED ORDER — POTASSIUM CHLORIDE CRYS ER 20 MEQ PO TBCR
20.0000 meq | EXTENDED_RELEASE_TABLET | Freq: Once | ORAL | Status: AC
  Administered 2019-02-22: 20 meq via ORAL
  Filled 2019-02-22: qty 1

## 2019-02-22 NOTE — Progress Notes (Signed)
Subjective: No new complaints   Antibiotics:  Anti-infectives (From admission, onward)   Start     Dose/Rate Route Frequency Ordered Stop   02/07/19 2230  atovaquone (MEPRON) 750 MG/5ML suspension 750 mg  Status:  Discontinued     750 mg Oral Daily with breakfast 02/07/19 1729 02/10/19 1543   02/07/19 2200  acyclovir (ZOVIRAX) tablet 400 mg     400 mg Oral 2 times daily 02/07/19 1729     02/07/19 1800  flucytosine (ANCOBON) capsule 2,500 mg     25 mg/kg  95.7 kg Oral Every 6 hours 02/07/19 1528     02/07/19 1800  amphotericin B liposome (AMBISOME) 250 mg in dextrose 5 % 500 mL IVPB     3 mg/kg  82.1 kg (Adjusted) 250 mL/hr over 120 Minutes Intravenous Every 24 hours 02/07/19 1528        Medications: Scheduled Meds: . acyclovir  400 mg Oral BID  . Chlorhexidine Gluconate Cloth  6 each Topical Daily  . flucytosine  25 mg/kg Oral Q6H  . levETIRAcetam  500 mg Oral Q12H  . loratadine  10 mg Oral Daily  . pantoprazole  40 mg Oral Q supper  . potassium chloride  40 mEq Oral TID  . sodium chloride  1,000 mL Intravenous Q24H  . sodium chloride  500 mL Intravenous Q24H   Continuous Infusions: . amphotericin  B  Liposome (AMBISOME) ADULT IV 250 mg (02/21/19 1832)   PRN Meds:.acetaminophen, acetaminophen, diphenhydrAMINE **OR** diphenhydrAMINE, HYDROcodone-acetaminophen, meperidine (DEMEROL) injection, ondansetron **OR** ondansetron (ZOFRAN) IV, promethazine, sodium chloride flush    Objective: Weight change:   Intake/Output Summary (Last 24 hours) at 02/22/2019 1151 Last data filed at 02/22/2019 0601 Gross per 24 hour  Intake 750 ml  Output 0 ml  Net 750 ml   Blood pressure 122/74, pulse (!) 108, temperature 98.6 F (37 C), temperature source Oral, resp. rate 18, height 5\' 10"  (1.778 m), weight 94.1 kg, SpO2 97 %. Temp:  [98.4 F (36.9 C)-99.3 F (37.4 C)] 98.6 F (37 C) (10/13 0909) Pulse Rate:  [77-109] 108 (10/13 0909) Resp:  [18] 18 (10/13 0909) BP:  (108-131)/(72-85) 122/74 (10/13 0909) SpO2:  [96 %-100 %] 97 % (10/13 0909)  Physical Exam: General: Alert and awake, oriented x3, not in any acute distress. Surgical wounds healing well HEENT: anicteric sclera, EOMI CVS regular rate, normal  Chest: , no wheezing, no respiratory distress Abdomen: soft non-distended,  Extremities: no edema or deformity noted bilaterally Skin: no rashes Neuro: nonfocal  CBC:    BMET Recent Labs    02/21/19 0353 02/22/19 0433  NA 141 140  K 4.1 3.8  CL 109 108  CO2 23 21*  GLUCOSE 116* 117*  BUN 6 7  CREATININE 1.20 1.16  CALCIUM 8.5* 8.7*     Liver Panel  No results for input(s): PROT, ALBUMIN, AST, ALT, ALKPHOS, BILITOT, BILIDIR, IBILI in the last 72 hours.     Sedimentation Rate No results for input(s): ESRSEDRATE in the last 72 hours. C-Reactive Protein No results for input(s): CRP in the last 72 hours.  Micro Results: Recent Results (from the past 720 hour(s))  CSF culture     Status: Abnormal   Collection Time: 02/04/19  9:30 AM   Specimen: Lumbar Puncture; Cerebrospinal Fluid  Result Value Ref Range Status   MICRO NUMBER: [id number]  Final   SPECIMEN QUALITY: Adequate  Final   Source CEREBROSPINAL FLUID (CSF)  Final   STATUS:  FINAL  Final   GRAM STAIN: (A)  Final    Many White blood cells seen No organisms seen Gram stain prepared by cytospin   ISOLATE 1: Cryptococcus neoformans (AA)  Final    Comment: Scant growth of Cryptococcus neoformans   MYCOBACTERIA, CULTURE, WITH FLUOROCHROME SMEAR     Status: None (Preliminary result)   Collection Time: 02/04/19  9:30 AM  Result Value Ref Range Status   MICRO NUMBER: [id number]  Preliminary   SPECIMEN QUALITY: Adequate  Preliminary   Source: CEREBROSPINAL FLUID (CSF)  Preliminary   STATUS: PRELIMINARY  Preliminary   SMEAR: No acid fast bacilli seen.  Preliminary   RESULT:   Preliminary    Culture results to follow. Final reports of negative cultures can be expected in  approximately six weeks. Positive cultures are reported immediately.  Culture, blood (Routine x 2)     Status: None   Collection Time: 02/07/19  1:50 PM   Specimen: BLOOD LEFT ARM  Result Value Ref Range Status   Specimen Description BLOOD LEFT ARM  Final   Special Requests   Final    BOTTLES DRAWN AEROBIC AND ANAEROBIC Blood Culture results may not be optimal due to an excessive volume of blood received in culture bottles   Culture   Final    NO GROWTH 5 DAYS Performed at [facility], [place] [address], [place], [place] [address]    Report Status 02/12/2019 FINAL  Final  Culture, blood (Routine x 2)     Status: None   Collection Time: 02/07/19  3:06 PM   Specimen: BLOOD RIGHT ARM  Result Value Ref Range Status   Specimen Description BLOOD RIGHT ARM  Final   Special Requests   Final    BOTTLES DRAWN AEROBIC AND ANAEROBIC Blood Culture results may not be optimal due to an excessive volume of blood received in culture bottles   Culture   Final    NO GROWTH 5 DAYS Performed at [facility], [place] [address] [place], [place] [address]    Report Status 02/12/2019 FINAL  Final  SARS CORONAVIRUS 2 (TAT 6-24 HRS) Nasopharyngeal Nasopharyngeal Swab     Status: None   Collection Time: 02/07/19  3:54 PM   Specimen: Nasopharyngeal Swab  Result Value Ref Range Status   SARS Coronavirus 2 NEGATIVE NEGATIVE Final    Comment: (NOTE) SARS-CoV-2 target nucleic acids are NOT DETECTED. The SARS-CoV-2 RNA is generally detectable in upper and lower respiratory specimens during the acute phase of infection. Negative results do not preclude SARS-CoV-2 infection, do not rule out co-infections with other pathogens, and should not be used as the sole basis for treatment or other patient management decisions. Negative results must be combined with clinical observations, patient history, and epidemiological information. The expected result is Negative. Fact Sheet for Patients:  SugarRoll.be Fact Sheet for Healthcare Providers: https://www.woods-mathews.com/ This test is not yet approved or cleared by the [place] FDA and  has been authorized for detection and/or diagnosis of SARS-CoV-2 by FDA under an Emergency Use Authorization (EUA). This EUA will remain  in effect (meaning this test can be used) for the duration of the COVID-19 declaration under Section 56 4(b)(1) of the Act, 21 U.S.C. section 360bbb-3(b)(1), unless the authorization is terminated or revoked sooner. Performed at [facility], [place] [address], [place], [place] [address]   CSF culture     Status: None   Collection Time: 02/09/19 11:36 AM   Specimen: PATH Cytology  CSF; Cerebrospinal Fluid  Result Value Ref Range Status   Specimen Description CSF  Final   Special Requests NONE  Final   Gram Stain   Final    WBC PRESENT,BOTH PMN AND MONONUCLEAR NO ORGANISMS SEEN CYTOSPIN SMEAR    Culture   Final    NO GROWTH 3 DAYS Performed at [facility], [address], [place], [place] [address]    Report Status 02/13/2019 FINAL  Final  CSF culture with Stat gram stain     Status: None   Collection Time: 02/11/19 11:32 AM   Specimen: CSF; Cerebrospinal Fluid  Result Value Ref Range Status   Specimen Description CSF  Final   Special Requests Immunocompromised  Final   Gram Stain   Final    WBC PRESENT, PREDOMINANTLY MONONUCLEAR NO ORGANISMS SEEN CYTOSPIN SMEAR    Culture   Final    NO GROWTH Performed at [facility], [place] [address], [place], [place] [address]    Report Status 02/14/2019 FINAL  Final  CSF culture     Status: None   Collection Time: 02/12/19 11:14 AM   Specimen: PATH Cytology CSF; Cerebrospinal Fluid  Result Value Ref Range Status   Specimen Description CSF  Final   Special Requests NONE  Final   Gram Stain   Final    WBC PRESENT, PREDOMINANTLY MONONUCLEAR NO ORGANISMS SEEN CYTOSPIN SMEAR    Culture    Final    NO GROWTH 3 DAYS Performed at [facility], [place] [address] [place], [place] [address]    Report Status 02/15/2019 FINAL  Final  Fungus Culture With Stain     Status: None (Preliminary result)   Collection Time: 02/14/19  7:29 PM  Result Value Ref Range Status   Fungus Stain Final report  Final    Comment: (NOTE) Performed At: [facility] [place], [place] [id number] Rush Farmer MD [id number]    Fungus (Mycology) Culture PENDING  Incomplete   Fungal Source PENDING  Incomplete  Fungus Culture Result     Status: None   Collection Time: 02/14/19  7:29 PM  Result Value Ref Range Status   Result 1 Comment  Final    Comment: (NOTE) KOH/Calcofluor preparation:  no fungus observed. Performed At: [facility] [place], [place] [id number] Rush Farmer MD [id number]   CSF culture with Stat gram stain     Status: None   Collection Time: 02/14/19  7:53 PM   Specimen: CSF; Cerebrospinal Fluid  Result Value Ref Range Status   Specimen Description CSF  Final   Special Requests Normal  Final   Gram Stain   Final    WBC PRESENT, PREDOMINANTLY PMN NO ORGANISMS SEEN CYTOSPIN SMEAR    Culture   Final    NO GROWTH 3 DAYS Performed at [facility], [address] [place], [place] [address]    Report Status 02/18/2019 FINAL  Final  Surgical pcr screen     Status: None   Collection Time: 02/16/19  8:46 AM   Specimen: Nasal Mucosa; Nasal Swab  Result Value Ref Range Status   MRSA, PCR NEGATIVE NEGATIVE Final   Staphylococcus aureus NEGATIVE NEGATIVE Final    Comment: (NOTE) The Xpert SA Assay (FDA approved for NASAL specimens in patients 34 years of age and older), is one component of a comprehensive surveillance program. It is not intended to diagnose infection nor to guide or monitor treatment. Performed at [facility], [place] [address] [place], [place] [address]     Studies/Results: No results  found.    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 Christella Noa  #1 cryptococcal meningitis: Grateful to Dr. Christella Noa for placing VP shunt for pressure control.  Because the patient is non-HIV patient with cryptococcal meningitis the induction.  Is 1 month of amphotericin with flucytosine.  The patient is very much wanting to complete this therapy at home but I am apprehensive about risks of nephrotoxicity hypokalemia hypomagnesemia and bone marrow toxicity.  We will endeavor to see if this is something that could be done at home with frequent laboratory monitoring.  Otherwise he has roughly 2 weeks left to finish his induction therapy prior to going onto fluconazole which she will need to be on for a minimum of a year.     LOS: 15 days   Alcide Evener 02/22/2019, 11:51 AM

## 2019-02-22 NOTE — Progress Notes (Signed)
PROGRESS NOTE    David Whitehead  [id number] DOB: 08/29/1970 DOA: 02/07/2019 PCP: [name], MD   Brief Narrative: 48 year old male with history of CLL, hypertension, diabetes type 2 who presented with headache, diplopia. He underwent work-up for headache as an outpatient. He was seen by neurology and underwent lumbar puncture under fluoroscopy and was found to have elevated opening pressure of 55. He noted immediate improvement in headache after lumbar puncture. Cryptococcal antigen titers came out to be positive at 1: 64 and patient was sent to the emergency department. ID consulted after admission. Started on antibiotics. His symptoms of headache and diplopia have significantly improved. Hospital course remarkable for persistent elevated opening pressure so he has been undergoing series of lumbar punctures. Plan for another puncture lumbar tomorrow.  02/22/2019-wife still anxious to get him home.  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  Continue amphotericin and flucytosine and acyclovir.  ID following and recommends a total of 4 weeks of IV therapy before transitioning to oral fluconazole.16 th day today.Marland Kitchen MRI of the brain on 02/16/2019 shows abnormal leptomeningeal enhancement and cerebritis.  Patient followed by pharmacy for following renal functions as well as electrolytes while on IV antifungal therapy. Pharmacy following and monitoring renal functions potassium and magnesium while he is on amphotericin.Patient has a PICC line in the left upper extremity.  #2 history of CLL chemotherapy on hold.  #3 hypertension he stopped 108/ 73 hold metoprolol.     Estimated body mass index is 29.76  kg/m as calculated from the following:   Height as of this encounter: 5\' 10"  (1.778 m).   Weight as of this encounter: 94.1 kg.  DVT prophylaxis:SCD  code Status:Full code  family Communication:None at bedside  disposition Plan:Pending clinical improvement patient is currently on IV antibiotics  Consultants:  Neurosurgery and ID  Procedures:Lumbar punctureswith patient 02/18/2019 antimicrobials:Amphotericin B, acyclovir, flucytosine Subjective:  Patient resting in bed has no complaints anxious to go home denies any headache Objective: Vitals:   02/21/19 1710 02/21/19 2044 02/22/19 0551 02/22/19 0909  BP: 131/85 120/80 113/72 122/74  Pulse: 86 (!) 109 (!) 109 (!) 108  Resp: 18 18 18 18   Temp: 98.4 F (36.9 C) 99.3 F (37.4 C) 99.1 F (37.3 C) 98.6 F (37 C)  TempSrc: Oral Oral Oral Oral  SpO2: 100% 98% 96% 97%  Weight:      Height:        Intake/Output Summary (Last 24 hours) at 02/22/2019 1351 Last data filed at 02/22/2019 0601 Gross per 24 hour  Intake 750 ml  Output 0 ml  Net 750 ml   Filed Weights   02/15/19 0514 02/18/19 0659 02/20/19 0441  Weight: 92.4 kg 92.4 kg 94.1 kg    Examination:  General exam: Appears calm and comfortable  Respiratory system: Clear to auscultation. Respiratory effort normal. Cardiovascular system: S1 & S2 heard, RRR. No JVD, murmurs, rubs, gallops or clicks. No pedal edema. Gastrointestinal system: Abdomen is nondistended, soft and nontender. No organomegaly or masses felt. Normal bowel sounds heard. Central nervous system: Alert and oriented. No focal neurological deficits. Extremities: Symmetric 5 x 5 power. Skin: No rashes, lesions or ulcers Psychiatry: Judgement and insight appear normal. Mood & affect appropriate.     Data Reviewed:  I have personally reviewed following labs and imaging studies  CBC: Recent Labs  Lab 02/18/19 0454 02/19/19 0422 02/20/19 0345 02/21/19 0353 02/22/19 0433  WBC 9.2 11.0* 9.6  10.9* 10.9*  HGB 14.2 12.7* 12.3* 12.9* 13.1  HCT 41.8 38.7* 37.7* 39.7 40.5  MCV 82.9 83.9 84.3 84.3 83.7  PLT 105* 96* 101* 102* [id number]*   Basic Metabolic Panel: Recent Labs  Lab 02/18/19 0454 02/19/19 0422 02/20/19 0345 02/21/19 0353 02/22/19 0433  NA 142 141 142 141 140  K 4.1 4.1 3.6 4.1 3.8  CL 110 108 109 109 108  CO2 22 23 24 23  21*  GLUCOSE 104* 115* 116* 116* 117*  BUN 7 8 7 6 7   CREATININE 1.12 1.21 1.10 1.20 1.16  CALCIUM 9.0 8.8* 8.8* 8.5* 8.7*  MG 1.8 1.9 1.9 1.7 1.6*   GFR: Estimated Creatinine Clearance: 91.6 mL/min (by C-G formula based on SCr of 1.16 mg/dL). Liver Function Tests: Recent Labs  Lab 02/19/19 0422  AST 12*  ALT 19  ALKPHOS 41  BILITOT 0.7  PROT 4.7*  ALBUMIN 3.0*   No results for input(s): LIPASE, AMYLASE in the last 168 hours. No results for input(s): AMMONIA in the last 168 hours. Coagulation Profile: Recent Labs  Lab 02/16/19 0825  INR 1.1   Cardiac Enzymes: No results for input(s): CKTOTAL, CKMB, CKMBINDEX, TROPONINI in the last 168 hours. BNP (last 3 results) No results for input(s): PROBNP in the last 8760 hours. HbA1C: No results for input(s): HGBA1C in the last 72 hours. CBG: Recent Labs  Lab 02/18/19 1155  GLUCAP 118*   Lipid Profile: No results for input(s): CHOL, HDL, LDLCALC, TRIG, CHOLHDL, LDLDIRECT in the last 72 hours. Thyroid Function Tests: No results for input(s): TSH, T4TOTAL, FREET4, T3FREE, THYROIDAB in the last 72 hours. Anemia Panel: No results for input(s): VITAMINB12, FOLATE, FERRITIN, TIBC, IRON, RETICCTPCT in the last 72 hours. Sepsis Labs: No results for input(s): PROCALCITON, LATICACIDVEN in the last 168 hours.  Recent Results (from the past 240 hour(s))  Fungus Culture With Stain     Status: None (Preliminary result)   Collection Time: 02/14/19  7:29 PM  Result Value Ref Range Status   Fungus Stain Final report  Final    Comment: (NOTE) Performed At: [facility] [place], [place] [id number] Rush Farmer MD [id number]    Fungus (Mycology) Culture PENDING  Incomplete   Fungal Source PENDING  Incomplete  Fungus Culture Result     Status: None   Collection Time: 02/14/19  7:29 PM  Result Value Ref Range Status   Result 1 Comment  Final    Comment: (NOTE) KOH/Calcofluor preparation:  no fungus observed. Performed At: [facility] [place], [place] [id number] Rush Farmer MD [id number]   CSF culture with Stat gram stain     Status: None   Collection Time: 02/14/19  7:53 PM   Specimen: CSF; Cerebrospinal Fluid  Result Value Ref Range Status   Specimen Description CSF  Final   Special Requests Normal  Final   Gram Stain   Final    WBC PRESENT, PREDOMINANTLY PMN NO ORGANISMS SEEN CYTOSPIN SMEAR    Culture   Final    NO GROWTH 3 DAYS Performed at [facility], [address] [place], [place] [address]    Report Status 02/18/2019 FINAL  Final  Surgical pcr screen     Status: None   Collection Time: 02/16/19  8:46 AM   Specimen:  Nasal Mucosa; Nasal Swab  Result Value Ref Range Status   MRSA, PCR NEGATIVE NEGATIVE Final   Staphylococcus aureus NEGATIVE NEGATIVE Final    Comment: (NOTE) The Xpert SA Assay (FDA approved for NASAL specimens in patients 7 years of age and older), is one component of a comprehensive surveillance program. It is not intended to diagnose infection nor to guide or monitor treatment. Performed at [facility], [place] [address] [place], [place] [address]          Radiology Studies: No results found.      Scheduled Meds: . acyclovir  400 mg Oral BID  . Chlorhexidine Gluconate Cloth  6 each Topical Daily  . flucytosine  25 mg/kg Oral Q6H  . levETIRAcetam  500 mg Oral Q12H  . loratadine  10 mg Oral Daily  . pantoprazole  40 mg Oral Q supper  . potassium chloride  40 mEq Oral TID  . sodium chloride  1,000 mL Intravenous Q24H  . sodium chloride  500 mL  Intravenous Q24H   Continuous Infusions: . amphotericin  B  Liposome (AMBISOME) ADULT IV 250 mg (02/21/19 1832)     LOS: 15 days     Georgette Shell, MD [facility]  If 7PM-7AM, please contact night-coverage [email or web address] Password [email or web address] 02/22/2019, 1:51 PM

## 2019-02-22 NOTE — Progress Notes (Signed)
Pharmacy Antibiotic Note  David Whitehead is a 48 y.o. 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 Pharmacy has been consulted for amphotericin B liposome dosing. Ambisome 3 mg/kg and flucytosine 25 mg/kg started in this patient with suspected cryptococcal meningitis. Normal saline ordered to be given an hour before and an hour after the Ambisome infusion with D5 flushes administered inbetween. The pharmacy team will monitor the patients renal function, potassium and magnesium and make adjustments per protocol.  Potassium today is down slightly to 3.8 despite 120 mEq of replacement yesterday. Will plan extra supplementation with 20 mEq today. Mg is down to 1.6 - Will replace with 2 gm IV Mg.   SCr is stable at 1.16. Will watch renal function closely and continue fluids to 1 L pre Ambisome dose and 500 mL post.   Plan: Continue Ambisome 250 mg q24h (3 mg/kg) Continue Flucytosine 2,500 mg q6h (25 mg/kg) Continue potassium to 40 mEq TID  Mg sulfate 2 GM IV X 1 today  Extra dose of potassium 20 mEq today  Continue pre-dose fluid at 1000 mL and 500 mL post-dose Monitor Scr and CBC   Height: 5\' 10"  (177.8 cm) Weight: 207 lb 6.4 oz (94.1 kg) IBW/kg (Calculated) : 73  Temp (24hrs), Avg:99.2 F (37.3 C), Min:98.4 F (36.9 C), Max:100.4 F (38 C)  Recent Labs  Lab 02/18/19 0454 02/19/19 0422 02/20/19 0345 02/21/19 0353 02/22/19 0433  WBC 9.2 11.0* 9.6 10.9* 10.9*  CREATININE 1.12 1.21 1.10 1.20 1.16    Estimated Creatinine Clearance: 91.6 mL/min (by C-G formula based on SCr of 1.16 mg/dL).    Allergies  Allergen Reactions  . Bactrim [Sulfamethoxazole-Trimethoprim] Rash  . Latex Other (See Comments)    Took skin off when removed tape  . Amoxicillin Rash    Has patient had a PCN reaction causing immediate rash, facial/tongue/throat swelling, SOB or lightheadedness with hypotension: No Has patient had a PCN reaction causing severe rash involving mucus membranes or skin  necrosis: No Has patient had a PCN reaction that required hospitalization: No Has patient had a PCN reaction occurring within the last 10 years: No If all of the above answers are "NO", then may proceed with Cephalosporin use.     Antimicrobials this admission: Ambisome  9/28>> Flucytosine 9/28>>   Thank you for allowing pharmacy to be a part of this patient's care.  Jimmy Footman, PharmD, BCPS, BCIDP Infectious Diseases Clinical Pharmacist Phone: [phone] 02/22/2019 8:10 AM

## 2019-02-23 LAB — MAGNESIUM: Magnesium: 2.1 mg/dL (ref 1.7–2.4)

## 2019-02-23 LAB — BASIC METABOLIC PANEL
Anion gap: 11 (ref 5–15)
BUN: 5 mg/dL — ABNORMAL LOW (ref 6–20)
CO2: 21 mmol/L — ABNORMAL LOW (ref 22–32)
Calcium: 8.6 mg/dL — ABNORMAL LOW (ref 8.9–10.3)
Chloride: 107 mmol/L (ref 98–111)
Creatinine, Ser: 1.09 mg/dL (ref 0.61–1.24)
GFR calc Af Amer: >60 mL/min (ref >60)
GFR calc non Af Amer: >60 mL/min (ref >60)
Glucose, Bld: 119 mg/dL — ABNORMAL HIGH (ref 70–99)
Potassium: 4.6 mmol/L (ref 3.5–5.1)
Sodium: 139 mmol/L (ref 135–145)

## 2019-02-23 NOTE — Progress Notes (Signed)
PROGRESS NOTE    David Whitehead  [id number] DOB: 31-Jul-1970 DOA: 02/07/2019 PCP: [name], MD  Brief Narrative:48 year old male with history of CLL, hypertension, diabetes type 2 who presented with headache, diplopia. He underwent work-up for headache as an outpatient. He was seen by neurology and underwent lumbar puncture under fluoroscopy and was found to have elevated opening pressure of 55. He noted immediate improvement in headache after lumbar puncture. Cryptococcal antigen titers came out to be positive at 1: 64 and patient was sent to the emergency department. ID consulted after admission. Started on antibiotics. His symptoms of headache and diplopia have significantly improved. Hospital course remarkable for persistent elevated opening pressure so he has been undergoing series of lumbar punctures. Plan for another puncture lumbar tomorrow.   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 ID following and recommends a total of 4 weeks of IV amphotericin before transitioning to oral fluconazole.17 th day today. CSF fluid 02/18/2019- negative for cryptococcal antigen. ID stopped flucytosine today 02/23/2019 due to thrombocytopenia.  Marland KitchenMRI of the brain on 02/16/2019 shows abnormal leptomeningeal enhancement and cerebritis.  Patient followed by pharmacy for following renal functions as well as electrolytes while on IV amphotericin.Patient has a PICC line in the left upper extremity.  Case manager try to set him up with [facility] and I was told that and they cannot take care of his needs as an outpatient as it is more complex and time-consuming.   Wife and the patient wanted to go home and continue IV amphotericin at home.  I discussed with his wife [name] today.  Finally she is come to a conclusion that if he needs to let them stay and come back when he is done with amphotericin.  #2 history of CLL chemotherapy on hold. Dr Maylon Peppers is the onc  #3 hypertensionhe stopped 108/73-119/79 hold metoprolol.    Estimated body mass index is 29.76 kg/m as calculated from the following:   Height as of this encounter: 5\' 10"  (1.778 m).   Weight as of this encounter: 94.1 kg.  DVT prophylaxis:SCD  code Status:Full code  family Communication: Discussed with his wife  Daily   disposition Plan:Pending clinical improvement patient is currently on IV antibiotics  Consultants:  Neurosurgery and ID  Procedures:Lumbar punctureswith patient 02/18/2019 antimicrobials:Amphotericin B  Subjective:  Feels well.Marland Kitchenanxious to go home Objective: Vitals:   02/22/19 1750 02/22/19 2140 02/23/19 0507 02/23/19 0907  BP: 119/78 113/81 107/76 119/79  Pulse: 94 96 (!) 106 (!) 102  Resp: 18 18 18 18   Temp: 98.3 F (36.8 C) 98.9 F (37.2 C) 98.6 F (37 C) 98.9 F (37.2 C)  TempSrc: Oral Oral Oral Oral  SpO2: 100% 97% 96% 97%  Weight:      Height:        Intake/Output Summary (Last 24 hours) at 02/23/2019 1317 Last data filed at 02/23/2019 1000 Gross per 24 hour  Intake 1270 ml  Output 0 ml  Net 1270 ml   Filed Weights   02/15/19 0514 02/18/19 0659 02/20/19 0441  Weight: 92.4 kg 92.4 kg 94.1 kg    Examination:  General exam: Appears calm and comfortable  Respiratory system: Clear to auscultation. Respiratory effort normal. Cardiovascular system: S1 & S2  heard, RRR. No JVD, murmurs, rubs, gallops or clicks. No pedal edema. Gastrointestinal system: Abdomen is nondistended, soft and nontender. No organomegaly or masses felt. Normal bowel sounds heard. Central nervous system: Alert and oriented. No focal neurological deficits.  Extremities: Symmetric 5 x 5 power. Skin: No rashes, lesions or ulcers Psychiatry: Judgement and insight appear normal. Mood & affect appropriate.     Data Reviewed: I have personally reviewed following labs and imaging studies  CBC: Recent Labs  Lab 02/18/19 0454 02/19/19 0422 02/20/19 0345 02/21/19 0353 02/22/19 0433  WBC 9.2 11.0* 9.6 10.9* 10.9*  HGB 14.2 12.7* 12.3* 12.9* 13.1  HCT 41.8 38.7* 37.7* 39.7 40.5  MCV 82.9 83.9 84.3 84.3 83.7  PLT 105* 96* 101* 102* 123XX123*   Basic Metabolic Panel: Recent Labs  Lab 02/19/19 0422 02/20/19 0345 02/21/19 0353 02/22/19 0433 02/23/19 0432  NA 141 142 141 140 139  K 4.1 3.6 4.1 3.8 4.6  CL 108 109 109 108 107  CO2 23 24 23  21* 21*  GLUCOSE 115* 116* 116* 117* 119*  BUN 8 7 6 7  5*  CREATININE 1.21 1.10 1.20 1.16 1.09  CALCIUM 8.8* 8.8* 8.5* 8.7* 8.6*  MG 1.9 1.9 1.7 1.6* 2.1   GFR: Estimated Creatinine Clearance: 97.5 mL/min (by C-G formula based on SCr of 1.09 mg/dL). Liver Function Tests: Recent Labs  Lab 02/19/19 0422  AST 12*  ALT 19  ALKPHOS 41  BILITOT 0.7  PROT 4.7*  ALBUMIN 3.0*   No results for input(s): LIPASE, AMYLASE in the last 168 hours. No results for input(s): AMMONIA in the last 168 hours. Coagulation Profile: No results for input(s): INR, PROTIME in the last 168 hours. Cardiac Enzymes: No results for input(s): CKTOTAL, CKMB, CKMBINDEX, TROPONINI in the last 168 hours. BNP (last 3 results) No results for input(s): PROBNP in the last 8760 hours. HbA1C: No results for input(s): HGBA1C in the last 72 hours. CBG: Recent Labs  Lab 02/18/19 1155  GLUCAP 118*   Lipid Profile: No results for input(s): CHOL, HDL, LDLCALC, TRIG, CHOLHDL, LDLDIRECT in the last 72 hours. Thyroid Function Tests: No results for input(s): TSH, T4TOTAL, FREET4, T3FREE, THYROIDAB in the last 72 hours. Anemia Panel: No results for input(s): VITAMINB12, FOLATE, FERRITIN, TIBC, IRON, RETICCTPCT in the last 72 hours.  Sepsis Labs: No results for input(s): PROCALCITON, LATICACIDVEN in the last 168 hours.  Recent Results (from the past 240 hour(s))  Fungus Culture With Stain     Status: None (Preliminary result)   Collection Time: 02/14/19  7:29 PM  Result Value Ref Range Status   Fungus Stain Final report  Final    Comment: (NOTE) Performed At: [facility] [place], [place] [id number] Rush Farmer MD [id number]    Fungus (Mycology) Culture PENDING  Incomplete   Fungal Source PENDING  Incomplete  Fungus Culture Result     Status: None   Collection Time: 02/14/19  7:29 PM  Result Value Ref Range Status   Result 1 Comment  Final    Comment: (NOTE) KOH/Calcofluor preparation:  no fungus observed. Performed At: [facility] [place], [place] [id number] Rush Farmer MD [id number]   CSF culture with Stat gram stain     Status: None   Collection Time: 02/14/19  7:53 PM   Specimen: CSF; Cerebrospinal Fluid  Result Value Ref Range Status   Specimen Description CSF  Final   Special Requests Normal  Final   Gram Stain   Final  WBC PRESENT, PREDOMINANTLY PMN NO ORGANISMS SEEN CYTOSPIN SMEAR    Culture   Final    NO GROWTH 3 DAYS Performed at [place] [address] [place], [place] [address]    Report Status 02/18/2019 FINAL  Final  Surgical pcr screen     Status: None   Collection Time: 02/16/19  8:46 AM   Specimen: Nasal Mucosa; Nasal Swab  Result Value Ref Range Status   MRSA, PCR NEGATIVE NEGATIVE Final   Staphylococcus aureus NEGATIVE NEGATIVE Final    Comment: (NOTE) The Xpert SA Assay (FDA approved for NASAL specimens in patients 65 years of age and older), is one component of a comprehensive surveillance program. It is not intended to diagnose infection nor to guide or monitor treatment. Performed at [facility], [place] [address] [place], [place] [address]          Radiology Studies: No results  found.      Scheduled Meds: . acyclovir  400 mg Oral BID  . Chlorhexidine Gluconate Cloth  6 each Topical Daily  . flucytosine  25 mg/kg Oral Q6H  . levETIRAcetam  500 mg Oral Q12H  . loratadine  10 mg Oral Daily  . pantoprazole  40 mg Oral Q supper  . potassium chloride  40 mEq Oral TID  . sodium chloride  1,000 mL Intravenous Q24H  . sodium chloride  500 mL Intravenous Q24H   Continuous Infusions: . amphotericin  B  Liposome (AMBISOME) ADULT IV 250 mg (02/22/19 1821)     LOS: 16 days      Georgette Shell, MD [facility] If 7PM-7AM, please contact night-coverage [email or web address] Password [email or web address] 02/23/2019, 1:17 PM

## 2019-02-23 NOTE — Progress Notes (Signed)
   INFECTIOUS DISEASE ATTENDING ADDENDUM:   This patient has been seen and discussed with the house staff. Please see the resident's note for complete details. I have reviewed the pertinent  laboratory and radiographic data and examined the patient independently.  I concur with their findings with the following additions/corrections:  Patient is in good spirits this morning was eating his breakfast when I visited him.  I have noted his platelets dropping in the last week or so.  I have some concerns with his may be related to his flucytosine and whether dose titration or limitation of this drug would be prudent.  Otherwise we will continue with amphotericin with plans for 4 weeks of induction therapy.  Again we will see if this is something that can be accomplished at home but I think that patients is a virtue here

## 2019-02-23 NOTE — TOC Initial Note (Signed)
Transition of Care [facility]) - Initial/Assessment Note    Patient Details  Name: David Whitehead MRN: [id number] Date of Birth: 02/02/1971  Transition of Care [facility]) CM/SW Contact:    Bartholomew Crews, RN Phone Number: [phone] 02/23/2019, 2:44 PM  Clinical Narrative:                 Spoke with patient at the bedside. PTA home with spouse - independent - has worked in [employer], commissioning - h/o CLL. Patient understanding of need for IV amphotericin and daily monitoring of electrolytes. Discussed unable to transition home with either [facility] or outpatient services d/t the intensity of workload and monitoring required at this time. Patient asked about qualifying for disability and how to apply. Advised patient that NCM is not an expert in this area, however, NCM reached out to Financial Counselor who advised that patient could initiate his application online. Discussed with patient that he could also seek legal assistance in this matter if he desired. Advised patient of referral to [facility] who will f/u with assitance for community needs and possible disability resources. Following for transition of care needs.   Expected Discharge Plan: Home/Self Care Barriers to Discharge: Continued Medical Work up   Patient Goals and CMS Choice Patient states their goals for this hospitalization and ongoing recovery are:: ready to return home when medically approved CMS Medicare.gov Compare Post Acute Care list provided to:: Patient Choice offered to / list presented to : NA  Expected Discharge Plan and Services Expected Discharge Plan: Home/Self Care In-house Referral: Financial Counselor, [facility] Discharge Planning Services: CM Consult Post Acute Care Choice: NA Living arrangements for the past 2 months: Single Family Home                 DME Arranged: N/A DME Agency: NA       HH Arranged: NA HH Agency: NA        Prior Living Arrangements/Services Living arrangements for the past 2 months: Single Family  Home Lives with:: Self, Spouse Patient language and need for interpreter reviewed:: Yes Do you feel safe going back to the place where you live?: Yes      Need for Family Participation in Patient Care: No (Comment) Care giver support system in place?: Yes (comment)   Criminal Activity/Legal Involvement Pertinent to Current Situation/Hospitalization: No - Comment as needed  Activities of Daily Living Home Assistive Devices/Equipment: Blood pressure cuff, Scales, Cane (specify quad or straight) ADL Screening (condition at time of admission) Patient's cognitive ability adequate to safely complete daily activities?: Yes Is the patient deaf or have difficulty hearing?: No Does the patient have difficulty seeing, even when wearing glasses/contacts?: No Does the patient have difficulty concentrating, remembering, or making decisions?: No Patient able to express need for assistance with ADLs?: Yes Does the patient have difficulty dressing or bathing?: No Independently performs ADLs?: Yes (appropriate for developmental age) Does the patient have difficulty walking or climbing stairs?: No Weakness of Legs: None Weakness of Arms/Hands: None  Permission Sought/Granted Permission sought to share information with : Family Supports Permission granted to share information with : Yes, Verbal Permission Granted  Share Information with NAME: Chrisshawn Foree     Permission granted to share info w Relationship: spouse  Permission granted to share info w Contact Information: [phone]  Emotional Assessment Appearance:: Appears stated age Attitude/Demeanor/Rapport: Engaged Affect (typically observed): Accepting Orientation: : Oriented to Self, Oriented to Place, Oriented to  Time, Oriented to Situation Alcohol / Substance Use: Not Applicable Psych Involvement:  No (comment)  Admission diagnosis:  Cryptococcal meningitis ([place]) [B45.1] Patient Active Problem List   Diagnosis Date Noted  .  Meningitis, cryptococcal ([place]) 02/18/2019  . Brain lesion   . Cryptococcal meningitis ([place]) 02/07/2019  . Hypertension   . White matter abnormality on MRI of brain 02/02/2019  . Iron deficiency 01/20/2019  . Leukocytosis 10/22/2018  . Pulmonary nodule 08/25/2018  . CLL (chronic lymphocytic leukemia) ([place]) 04/01/2018  . Dry mouth 03/31/2018  . Sleep apnea in adult 03/31/2018   PCP:  [name], MD Pharmacy:   [place], [place] - [place] [address] [place] [address] [place] [address] Phone: [phone] Fax: [phone]     Social Determinants of Health (SDOH) Interventions    Readmission Risk Interventions No flowsheet data found.

## 2019-02-23 NOTE — Progress Notes (Signed)
Subjective: No new complaints   Antibiotics:  Anti-infectives (From admission, onward)   Start     Dose/Rate Route Frequency Ordered Stop   02/07/19 2230  atovaquone (MEPRON) 750 MG/5ML suspension 750 mg  Status:  Discontinued     750 mg Oral Daily with breakfast 02/07/19 1729 02/10/19 1543   02/07/19 2200  acyclovir (ZOVIRAX) tablet 400 mg     400 mg Oral 2 times daily 02/07/19 1729     02/07/19 1800  flucytosine (ANCOBON) capsule 2,500 mg     25 mg/kg  95.7 kg Oral Every 6 hours 02/07/19 1528     02/07/19 1800  amphotericin B liposome (AMBISOME) 250 mg in dextrose 5 % 500 mL IVPB     3 mg/kg  82.1 kg (Adjusted) 250 mL/hr over 120 Minutes Intravenous Every 24 hours 02/07/19 1528       Medications: Scheduled Meds: . acyclovir  400 mg Oral BID  . Chlorhexidine Gluconate Cloth  6 each Topical Daily  . flucytosine  25 mg/kg Oral Q6H  . levETIRAcetam  500 mg Oral Q12H  . loratadine  10 mg Oral Daily  . pantoprazole  40 mg Oral Q supper  . potassium chloride  40 mEq Oral TID  . sodium chloride  1,000 mL Intravenous Q24H  . sodium chloride  500 mL Intravenous Q24H   Continuous Infusions: . amphotericin  B  Liposome (AMBISOME) ADULT IV 250 mg (02/22/19 1821)   PRN Meds:.acetaminophen, acetaminophen, diphenhydrAMINE **OR** diphenhydrAMINE, HYDROcodone-acetaminophen, meperidine (DEMEROL) injection, ondansetron **OR** ondansetron (ZOFRAN) IV, promethazine, sodium chloride flush  Objective:  Intake/Output Summary (Last 24 hours) at 02/23/2019 0942 Last data filed at 02/23/2019 0700 Gross per 24 hour  Intake 1030 ml  Output 0 ml  Net 1030 ml   Blood pressure 119/79, pulse (!) 102, temperature 98.9 F (37.2 C), temperature source Oral, resp. rate 18, height 5\' 10"  (1.778 m), weight 94.1 kg, SpO2 97 %. Temp:  [98.3 F (36.8 C)-98.9 F (37.2 C)] 98.9 F (37.2 C) (10/14 0907) Pulse Rate:  [94-106] 102 (10/14 0907) Resp:  [18] 18 (10/14 0907) BP: (107-119)/(76-81)  119/79 (10/14 0907) SpO2:  [96 %-100 %] 97 % (10/14 0907)  Physical Exam: General: Alert and awake, oriented x3, not in any acute distress. HEENT: anicteric sclera, EOMI CVS: regular rate, normal  Chest: no wheezing, no respiratory distress Abdomen: soft non-distended   Extremities: no edema or deformity noted bilaterally Skin: no rashes Neuro: nonfocal  CBC: CBC Latest Ref Rng & Units 02/22/2019 02/21/2019 02/20/2019  WBC 4.0 - 10.5 K/uL 10.9(H) 10.9(H) 9.6  Hemoglobin 13.0 - 17.0 g/dL 13.1 12.9(L) 12.3(L)  Hematocrit 39.0 - 52.0 % 40.5 39.7 37.7(L)  Platelets 150 - 400 K/uL 117(L) 102(L) 101(L)    BMET Recent Labs    02/22/19 0433 02/23/19 0432  NA 140 139  K 3.8 4.6  CL 108 107  CO2 21* 21*  GLUCOSE 117* 119*  BUN 7 5*  CREATININE 1.16 1.09  CALCIUM 8.7* 8.6*    Liver Panel  No results for input(s): PROT, ALBUMIN, AST, ALT, ALKPHOS, BILITOT, BILIDIR, IBILI in the last 72 hours.   Sedimentation Rate No results for input(s): ESRSEDRATE in the last 72 hours. C-Reactive Protein No results for input(s): CRP in the last 72 hours.  Micro Results: Recent Results (from the past 720 hour(s))  CSF culture     Status: Abnormal   Collection Time: 02/04/19  9:30 AM   Specimen: Lumbar Puncture; Cerebrospinal  Fluid  Result Value Ref Range Status   MICRO NUMBER: [id number]  Final   SPECIMEN QUALITY: Adequate  Final   Source CEREBROSPINAL FLUID (CSF)  Final   STATUS: FINAL  Final   GRAM STAIN: (A)  Final    Many White blood cells seen No organisms seen Gram stain prepared by cytospin   ISOLATE 1: Cryptococcus neoformans (AA)  Final    Comment: Scant growth of Cryptococcus neoformans   MYCOBACTERIA, CULTURE, WITH FLUOROCHROME SMEAR     Status: None (Preliminary result)   Collection Time: 02/04/19  9:30 AM  Result Value Ref Range Status   MICRO NUMBER: [id number]  Preliminary   SPECIMEN QUALITY: Adequate  Preliminary   Source: CEREBROSPINAL FLUID (CSF)  Preliminary    STATUS: PRELIMINARY  Preliminary   SMEAR: No acid fast bacilli seen.  Preliminary   RESULT:   Preliminary    Culture results to follow. Final reports of negative cultures can be expected in approximately six weeks. Positive cultures are reported immediately.  Culture, blood (Routine x 2)     Status: None   Collection Time: 02/07/19  1:50 PM   Specimen: BLOOD LEFT ARM  Result Value Ref Range Status   Specimen Description BLOOD LEFT ARM  Final   Special Requests   Final    BOTTLES DRAWN AEROBIC AND ANAEROBIC Blood Culture results may not be optimal due to an excessive volume of blood received in culture bottles   Culture   Final    NO GROWTH 5 DAYS Performed at [facility], [place] [address], [place], [place] [address]    Report Status 02/12/2019 FINAL  Final  Culture, blood (Routine x 2)     Status: None   Collection Time: 02/07/19  3:06 PM   Specimen: BLOOD RIGHT ARM  Result Value Ref Range Status   Specimen Description BLOOD RIGHT ARM  Final   Special Requests   Final    BOTTLES DRAWN AEROBIC AND ANAEROBIC Blood Culture results may not be optimal due to an excessive volume of blood received in culture bottles   Culture   Final    NO GROWTH 5 DAYS Performed at [facility], [place] [address] [place],  [address]    Report Status 02/12/2019 FINAL  Final  SARS CORONAVIRUS 2 (TAT 6-24 HRS) Nasopharyngeal Nasopharyngeal Swab     Status: None   Collection Time: 02/07/19  3:54 PM   Specimen: Nasopharyngeal Swab  Result Value Ref Range Status   SARS Coronavirus 2 NEGATIVE NEGATIVE Final    Comment: (NOTE) SARS-CoV-2 target nucleic acids are NOT DETECTED. The SARS-CoV-2 RNA is generally detectable in upper and lower respiratory specimens during the acute phase of infection. Negative results do not preclude SARS-CoV-2 infection, do not rule out co-infections with other pathogens, and should not be used as the sole basis for treatment or other patient management decisions.  Negative results must be combined with clinical observations, patient history, and epidemiological information. The expected result is Negative. Fact Sheet for Patients: SugarRoll.be Fact Sheet for Healthcare Providers: https://www.woods-mathews.com/ This test is not yet approved or cleared by the [place] FDA and  has been authorized for detection and/or diagnosis of SARS-CoV-2 by FDA under an Emergency Use Authorization (EUA). This EUA will remain  in effect (meaning this test can be used) for the duration of the COVID-19 declaration under Section 56 4(b)(1) of the Act, 21 U.S.C. section 360bbb-3(b)(1), unless the authorization is terminated or revoked sooner. Performed at [facility], [place] [address], [place], [place] [address]   CSF culture     Status: None   Collection Time: 02/09/19 11:36 AM   Specimen: PATH Cytology CSF; Cerebrospinal Fluid  Result Value Ref Range Status   Specimen Description CSF  Final   Special Requests NONE  Final   Gram Stain   Final    WBC PRESENT,BOTH PMN AND MONONUCLEAR NO ORGANISMS SEEN CYTOSPIN SMEAR    Culture   Final    NO GROWTH 3 DAYS Performed at [facility], [place] [address], [place], [place] [address]    Report Status 02/13/2019 FINAL  Final  CSF culture with Stat gram stain     Status: None   Collection Time: 02/11/19 11:32 AM   Specimen: CSF; Cerebrospinal Fluid  Result Value Ref Range Status   Specimen Description CSF  Final   Special Requests Immunocompromised  Final   Gram Stain   Final    WBC PRESENT, PREDOMINANTLY MONONUCLEAR NO ORGANISMS SEEN CYTOSPIN SMEAR    Culture   Final    NO GROWTH Performed at [facility], [place] [address], [place], [place] [address]    Report Status 02/14/2019 FINAL  Final  CSF culture     Status: None   Collection Time: 02/12/19 11:14 AM   Specimen: PATH Cytology CSF; Cerebrospinal Fluid  Result Value Ref Range Status    Specimen Description CSF  Final   Special Requests NONE  Final   Gram Stain   Final    WBC PRESENT, PREDOMINANTLY MONONUCLEAR NO ORGANISMS SEEN CYTOSPIN SMEAR    Culture   Final    NO GROWTH 3 DAYS Performed at [facility], [place] [address], [place], [place] [address]    Report Status 02/15/2019 FINAL  Final  Fungus Culture With Stain     Status: None (Preliminary result)   Collection Time: 02/14/19  7:29 PM  Result Value Ref Range Status   Fungus Stain Final report  Final    Comment: (NOTE) Performed At: [facility] [place], [place] [id number] Rush Farmer MD [id number]    Fungus (Mycology) Culture PENDING  Incomplete   Fungal Source PENDING  Incomplete  Fungus Culture Result     Status: None   Collection Time: 02/14/19  7:29 PM  Result Value Ref Range Status   Result 1 Comment  Final    Comment: (NOTE) KOH/Calcofluor preparation:  no fungus observed. Performed At: [facility] [place], [place] [id number] Rush Farmer MD [id number]   CSF culture with Stat gram stain     Status: None   Collection Time: 02/14/19  7:53 PM   Specimen: CSF; Cerebrospinal Fluid  Result Value Ref Range Status   Specimen Description CSF  Final   Special Requests Normal  Final   Gram Stain   Final    WBC PRESENT, PREDOMINANTLY PMN NO ORGANISMS SEEN CYTOSPIN SMEAR    Culture   Final    NO GROWTH 3 DAYS Performed at [facility], [address], [place],  [address]    Report Status 02/18/2019 FINAL  Final  Surgical pcr screen     Status: None   Collection Time: 02/16/19  8:46 AM   Specimen: Nasal Mucosa; Nasal Swab  Result Value Ref Range Status   MRSA, PCR NEGATIVE NEGATIVE Final   Staphylococcus aureus NEGATIVE NEGATIVE Final    Comment: (NOTE) The Xpert SA Assay (FDA approved for NASAL specimens  in patients 60 years of age and older), is one component of a comprehensive surveillance program. It is not  intended to diagnose infection nor to guide or monitor treatment. Performed at [facility], [place] [address] [place], [place] [address]     Studies/Results: No results found.    Assessment/Plan:  INTERVAL HISTORY:  Platelets dropped to approximately 100 over the past 4 days, though appear to be rebounding today to 117.   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  Currently on flucytosine and L-ampho with pre/post hydration per pharm. Electrolytes and kidney function stable. Received 148mEq potassium replacementyesterday and 2g IV Mg.Has a PICC line placed in the L upper extremity.   Pt was interested in going home, though case management has discovered his outpatient medical needs are more complex and time-consuming than home health can accomodate. Primary team spoke with pt's wife today.  Plan 1.Continue antifungals with pre/post hydration 2. Electrolyte replacement and monitoring per pharmacy protocol    LOS: 16 days   Ladona Horns 02/23/2019, 9:42 AM

## 2019-02-23 NOTE — Progress Notes (Signed)
Pharmacy Antibiotic Note  David Whitehead is a 48 y.o. 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 Pharmacy has been consulted for amphotericin B liposome dosing. Ambisome 3 mg/kg and flucytosine 25 mg/kg started in this patient with suspected cryptococcal meningitis. Normal saline ordered to be given an hour before and an hour after the Ambisome infusion with D5 flushes administered inbetween. The pharmacy team will monitor the patients renal function, potassium and magnesium and make adjustments per protocol.  Potassium today is up today after 140 mEq of replacement yesterday. Mg is up to 2.1 after 2 gm IV replacement yesterday.   SCr is stable at 1.09. Will watch renal function closely and continue fluids to 1 L pre Ambisome dose and 500 mL post. We will monitor his platelets closely, they have declined from his baseline but have started to rebound. We will continue his flucytosine at the current dose and follow the trend.   Plan: Continue Ambisome 250 mg q24h (3 mg/kg) Continue Flucytosine 2,500 mg q6h (25 mg/kg) Continue potassium to 40 mEq TID  Continue pre-dose fluid at 1000 mL and 500 mL post-dose Monitor Scr and CBC   Height: 5\' 10"  (177.8 cm) Weight: 207 lb 6.4 oz (94.1 kg) IBW/kg (Calculated) : 73  Temp (24hrs), Avg:98.6 F (37 C), Min:98.3 F (36.8 C), Max:98.9 F (37.2 C)  Recent Labs  Lab 02/18/19 0454 02/19/19 0422 02/20/19 0345 02/21/19 0353 02/22/19 0433 02/23/19 0432  WBC 9.2 11.0* 9.6 10.9* 10.9*  --   CREATININE 1.12 1.21 1.10 1.20 1.16 1.09    Estimated Creatinine Clearance: 97.5 mL/min (by C-G formula based on SCr of 1.09 mg/dL).    Allergies  Allergen Reactions  . Bactrim [Sulfamethoxazole-Trimethoprim] Rash  . Latex Other (See Comments)    Took skin off when removed tape  . Amoxicillin Rash    Has patient had a PCN reaction causing immediate rash, facial/tongue/throat swelling, SOB or lightheadedness with hypotension: No Has patient  had a PCN reaction causing severe rash involving mucus membranes or skin necrosis: No Has patient had a PCN reaction that required hospitalization: No Has patient had a PCN reaction occurring within the last 10 years: No If all of the above answers are "NO", then may proceed with Cephalosporin use.     Antimicrobials this admission: Ambisome  9/28>> Flucytosine 9/28>>   Thank you for allowing pharmacy to be a part of this patient's care.  Nicoletta Dress, PharmD PGY2 Infectious Disease Pharmacy Resident  02/23/2019 8:15 AM

## 2019-02-23 NOTE — Consult Note (Addendum)
   [facility] Inpatient Consult   02/23/2019  David Whitehead [birth year] [id number]  Call received from inpatient [facility] RNCM for  referral request for post hospital resources in community once returning.  She states that the patient admitted with complications of CLL and will continue with treatment for a week or so.  Primary Care Provider:  [name], MD, [place].  Plan:  Continue to follow for progress and for post hospital care needs.  Natividad Brood, RN BSN [place] Liaison  [phone] business mobile phone Toll free office [phone]  Fax number: [phone] [email or web address]

## 2019-02-24 LAB — CBC
HCT: 40.8 % (ref 39.0–52.0)
Hemoglobin: 13.6 g/dL (ref 13.0–17.0)
MCH: 27.6 pg (ref 26.0–34.0)
MCHC: 33.3 g/dL (ref 30.0–36.0)
MCV: 82.8 fL (ref 80.0–100.0)
Platelets: 140 10*3/uL — ABNORMAL LOW (ref 150–400)
RBC: 4.93 MIL/uL (ref 4.22–5.81)
RDW: 13.4 % (ref 11.5–15.5)
WBC: 10.1 10*3/uL (ref 4.0–10.5)
nRBC: 0 % (ref 0.0–0.2)

## 2019-02-24 LAB — BASIC METABOLIC PANEL
Anion gap: 9 (ref 5–15)
BUN: 6 mg/dL (ref 6–20)
CO2: 20 mmol/L — ABNORMAL LOW (ref 22–32)
Calcium: 8.6 mg/dL — ABNORMAL LOW (ref 8.9–10.3)
Chloride: 107 mmol/L (ref 98–111)
Creatinine, Ser: 1.33 mg/dL — ABNORMAL HIGH (ref 0.61–1.24)
GFR calc Af Amer: >60 mL/min (ref >60)
GFR calc non Af Amer: >60 mL/min (ref >60)
Glucose, Bld: 124 mg/dL — ABNORMAL HIGH (ref 70–99)
Potassium: 4.2 mmol/L (ref 3.5–5.1)
Sodium: 136 mmol/L (ref 135–145)

## 2019-02-24 LAB — MAGNESIUM: Magnesium: 1.9 mg/dL (ref 1.7–2.4)

## 2019-02-24 MED ORDER — SODIUM CHLORIDE 0.9 % IV SOLN
INTRAVENOUS | Status: DC | PRN
  Administered 2019-02-24: 1000 mL via INTRAVENOUS

## 2019-02-24 NOTE — Progress Notes (Addendum)
CHG not completed today Red rash noted on all extremities. Patient noticed the rash and moderate itching after continued use of CHG wipes. MD notified via secure chat, and recommended checking with pharmacy for substitution. Waiting for pharmacist recommendations, will continue to monitor.

## 2019-02-24 NOTE — Progress Notes (Signed)
PROGRESS NOTE    David Whitehead  [id number] DOB: Mar 20, 1971 DOA: 02/07/2019 PCP: [name], MD   Brief Narrative: 48 year old male with history of CLL, hypertension, diabetes type 2 who presented with headache, diplopia. He underwent work-up for headache as an outpatient. He was seen by neurology and underwent lumbar puncture under fluoroscopy and was found to have elevated opening pressure of 55. He noted immediate improvement in headache after lumbar puncture. Cryptococcal antigen titers came out to be positive at 1: 64 and patient was sent to the emergency department. ID consulted after admission. Started on antibiotics. His symptoms of headache and diplopia have significantly improved. Hospital course remarkable for persistent elevated opening pressure so he has been undergoing series of lumbar punctures.  Subjective: Denies headache, no new complaints, no double vision. Had been ambulating. 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  Followed by infectious disease and currently on induction therapy with amphotericin and flucytosine x4 wk. following which he needs to start on consolidative therapy with fluconazole 40 mg daily x8 weeks then 200 mg daily for a year.  Medication being dosed as per pharmacy with monitoring of electrolytes magnesium potassium and creatinine and has pre-/post hydration with therapy.MRI of the brain on 02/16/2019 shows abnormal leptomeningeal enhancement and cerebritis.  Continue on empiric Keppra.  CLL history and chemotherapy which is on hold.  Follow-up with Dr. Maylon Peppers  Hypertension pressure: Fairly stable to soft 107-1 20s  Mild AKI creatinine up at 1.3 today, monitor closely.  encouraged po  Body mass index is 29.76 kg/m.    DVT prophylaxis: SCD/ambulating Code Status: Full code  family Communication: plan  of care discussed with patient in detail.  Offered to call his wife . But he reported he has already talked to his wife and no need to call her wife for now Disposition Plan: Remains inpatient pending completion on anti cryptococcal treatment with  induction therapy   Consultants: Neurosurgery, infectious disease  Procedures: Lumbar puncture  Microbiology:  Antimicrobials: Anti-infectives (From admission, onward)   Start     Dose/Rate Route Frequency Ordered Stop   02/07/19 2230  atovaquone (MEPRON) 750 MG/5ML suspension 750 mg  Status:  Discontinued     750 mg Oral Daily with breakfast 02/07/19 1729 02/10/19 1543   02/07/19 2200  acyclovir (ZOVIRAX) tablet 400 mg     400 mg Oral 2 times daily 02/07/19 1729     02/07/19 1800  flucytosine (ANCOBON) capsule 2,500 mg     25 mg/kg  95.7 kg Oral Every 6 hours 02/07/19 1528     02/07/19 1800  amphotericin B liposome (AMBISOME) 250 mg in dextrose 5 % 500 mL IVPB     3 mg/kg  82.1 kg (Adjusted) 250 mL/hr over 120 Minutes Intravenous Every 24 hours 02/07/19 1528         Objective: Vitals:   02/23/19 1652 02/23/19 1800 02/23/19 2135 02/24/19 0551  BP: 113/85 116/78 111/74 118/79  Pulse: 98 96 95 100  Resp: 18  20 18   Temp: 99.3 F (37.4 C) 97.7 F (36.5 C) 99.1 F (37.3 C) 98.2 F (36.8 C)  TempSrc: Oral Oral Oral Oral  SpO2: 97% 97% 98% 97%  Weight:      Height:        Intake/Output Summary (Last 24 hours) at 02/24/2019 1021 Last data filed at 02/24/2019 0900 Gross per 24 hour  Intake 1840 ml  Output -  Net 1840 ml   Filed Weights   02/15/19 0514 02/18/19 0659 02/20/19 0441  Weight: 92.4 kg 92.4 kg 94.1 kg   Weight change:   Body mass index is 29.76 kg/m.  Intake/Output from previous day: 10/14 0701 - 10/15 0700 In: 1840 [P.O.:840; IV Piggyback:1000] Out: -  Intake/Output this shift: Total I/O In: 240 [P.O.:240] Out: -   Examination:  General exam: Appears calm and comfortable,NAD, on RA.  HEENT:PERRL,Oral mucosa moist, Ear/Nose normal on gross exam Respiratory system: Bilateral equal air entry, normal vesicular breath sounds, no wheezes or crackles  Cardiovascular system: S1 & S2 heard,No JVD, murmurs. Gastrointestinal system: Abdomen is  Soft, shunt site with healing incision. non tender, non distended, BS +  Nervous System:Alert and oriented. No focal neurological deficits/moving extremities, sensation intact. Extremities: No edema, no clubbing, distal peripheral pulses palpable. Skin: No rashes, lesions, no icterus MSK: Normal muscle bulk,tone ,power. Surgical site on head- x2 with staples-dry. Healing.  Medications:  Scheduled Meds: . acyclovir  400 mg Oral BID  . Chlorhexidine Gluconate Cloth  6 each Topical Daily  . flucytosine  25 mg/kg Oral Q6H  . levETIRAcetam  500 mg Oral Q12H  . loratadine  10 mg Oral Daily  . pantoprazole  40 mg Oral Q supper  . potassium chloride  40 mEq Oral TID  . sodium chloride  1,000 mL Intravenous Q24H  . sodium chloride  500 mL Intravenous Q24H   Continuous Infusions: . amphotericin  B  Liposome (AMBISOME) ADULT IV 250 mg (02/23/19 1844)    Data Reviewed: I have personally reviewed following labs and imaging studies  CBC: Recent Labs  Lab 02/19/19 0422 02/20/19 0345 02/21/19 0353 02/22/19 0433 02/24/19 0443  WBC 11.0* 9.6 10.9* 10.9* 10.1  HGB 12.7* 12.3* 12.9* 13.1 13.6  HCT 38.7* 37.7* 39.7 40.5 40.8  MCV 83.9 84.3 84.3 83.7 82.8  PLT 96* 101* 102* 117* [id number]*   Basic Metabolic Panel: Recent Labs  Lab 02/20/19 0345 02/21/19 0353 02/22/19 0433 02/23/19 0432 02/24/19 0443  NA 142 141 140 139 136  K 3.6 4.1 3.8 4.6 4.2  CL 109 109 108 107 107  CO2 24 23 21* 21* 20*  GLUCOSE 116* 116* 117* 119* 124*  BUN 7 6 7  5* 6  CREATININE 1.10 1.20 1.16 1.09 1.33*  CALCIUM 8.8* 8.5* 8.7* 8.6* 8.6*  MG 1.9 1.7 1.6* 2.1 1.9   GFR: Estimated Creatinine Clearance: 79.9 mL/min (A) (by C-G formula based on SCr of 1.33 mg/dL  (H)). Liver Function Tests: Recent Labs  Lab 02/19/19 0422  AST 12*  ALT 19  ALKPHOS 41  BILITOT 0.7  PROT 4.7*  ALBUMIN 3.0*   No results for input(s): LIPASE, AMYLASE in the last 168 hours. No results for input(s): AMMONIA in the last 168 hours. Coagulation Profile: No results for input(s): INR, PROTIME in the last 168 hours. Cardiac Enzymes: No results for input(s): CKTOTAL, CKMB, CKMBINDEX, TROPONINI in the last 168 hours. BNP (last 3 results) No results for input(s): PROBNP in the last 8760 hours. HbA1C: No results for input(s): HGBA1C in the last 72 hours. CBG: Recent Labs  Lab 02/18/19 1155  GLUCAP 118*   Lipid Profile: No results for input(s): CHOL, HDL, LDLCALC, TRIG, CHOLHDL, LDLDIRECT in the last 72 hours. Thyroid Function Tests: No results for input(s): TSH, T4TOTAL, FREET4, T3FREE, THYROIDAB in the last 72 hours. Anemia Panel: No results for input(s): VITAMINB12, FOLATE, FERRITIN, TIBC, IRON, RETICCTPCT in the  last 72 hours. Sepsis Labs: No results for input(s): PROCALCITON, LATICACIDVEN in the last 168 hours.  Recent Results (from the past 240 hour(s))  Fungus Culture With Stain     Status: None (Preliminary result)   Collection Time: 02/14/19  7:29 PM  Result Value Ref Range Status   Fungus Stain Final report  Final    Comment: (NOTE) Performed At: [facility] [place], [place] [id number] Rush Farmer MD [id number]    Fungus (Mycology) Culture PENDING  Incomplete   Fungal Source PENDING  Incomplete  Fungus Culture Result     Status: None   Collection Time: 02/14/19  7:29 PM  Result Value Ref Range Status   Result 1 Comment  Final    Comment: (NOTE) KOH/Calcofluor preparation:  no fungus observed. Performed At: [facility] [place], [place] [id number] Rush Farmer MD [id number]   CSF culture with Stat gram stain     Status: None   Collection Time: 02/14/19  7:53 PM   Specimen: CSF;  Cerebrospinal Fluid  Result Value Ref Range Status   Specimen Description CSF  Final   Special Requests Normal  Final   Gram Stain   Final    WBC PRESENT, PREDOMINANTLY PMN NO ORGANISMS SEEN CYTOSPIN SMEAR    Culture   Final    NO GROWTH 3 DAYS Performed at [facility], [address] [place], [place] [address]    Report Status 02/18/2019 FINAL  Final  Surgical pcr screen     Status: None   Collection Time: 02/16/19  8:46 AM   Specimen: Nasal Mucosa; Nasal Swab  Result Value Ref Range Status   MRSA, PCR NEGATIVE NEGATIVE Final   Staphylococcus aureus NEGATIVE NEGATIVE Final    Comment: (NOTE) The Xpert SA Assay (FDA approved for NASAL specimens in patients 71 years of age and older), is one component of a comprehensive surveillance program. It is not intended to diagnose infection nor to guide or monitor treatment. Performed at [facility], [place] [address] [place], [place] [address]       Radiology Studies: No results found.    LOS: 17 days   Time spent: More than 50% of that time was spent in counseling and/or coordination of care.  Antonieta Pert, MD [facility]  02/24/2019, 10:21 AM

## 2019-02-24 NOTE — Progress Notes (Signed)
Pharmacy Antibiotic Note  David Whitehead is a 47 y.o. 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 Pharmacy has been consulted for amphotericin B liposome dosing. Ambisome 3 mg/kg and flucytosine 25 mg/kg started in this patient with suspected cryptococcal meningitis. Normal saline ordered to be given an hour before and an hour after the Ambisome infusion with D5 flushes administered inbetween. The pharmacy team will monitor the patients renal function, potassium and magnesium and make adjustments per protocol.  Potassium today is stable at 4.2. Mg is stable at 1.9.  SCr is increased today from 1.09 to 1.33. Will watch renal function closely and continue fluids to 1 L pre Ambisome dose and 500 mL post. We will monitor his platelets closely, but it is reassuring that they are continuing to trend up. We will continue his flucytosine at the current dose and follow the trend.   Plan: Continue Ambisome 250 mg q24h (3 mg/kg) Continue Flucytosine 2,500 mg q6h (25 mg/kg) Continue potassium to 40 mEq TID  Continue pre-dose fluid at 1000 mL and 500 mL post-dose Monitor Scr and CBC   Height: 5\' 10"  (177.8 cm) Weight: 207 lb 6.4 oz (94.1 kg) IBW/kg (Calculated) : 73  Temp (24hrs), Avg:98.4 F (36.9 C), Min:97.6 F (36.4 C), Max:99.3 F (37.4 C)  Recent Labs  Lab 02/19/19 0422 02/20/19 0345 02/21/19 0353 02/22/19 0433 02/23/19 0432 02/24/19 0443  WBC 11.0* 9.6 10.9* 10.9*  --  10.1  CREATININE 1.21 1.10 1.20 1.16 1.09 1.33*    Estimated Creatinine Clearance: 79.9 mL/min (A) (by C-G formula based on SCr of 1.33 mg/dL (H)).    Allergies  Allergen Reactions  . Bactrim [Sulfamethoxazole-Trimethoprim] Rash  . Latex Other (See Comments)    Took skin off when removed tape  . Amoxicillin Rash    Has patient had a PCN reaction causing immediate rash, facial/tongue/throat swelling, SOB or lightheadedness with hypotension: No Has patient had a PCN reaction causing severe rash  involving mucus membranes or skin necrosis: No Has patient had a PCN reaction that required hospitalization: No Has patient had a PCN reaction occurring within the last 10 years: No If all of the above answers are "NO", then may proceed with Cephalosporin use.     Antimicrobials this admission: Ambisome  9/28>> Flucytosine 9/28>>   Thank you for allowing pharmacy to be a part of this patient's care.  Nicoletta Dress, PharmD PGY2 Infectious Disease Pharmacy Resident  02/24/2019 2:35 PM

## 2019-02-24 NOTE — Progress Notes (Signed)
Subjective: No new complaints. Reports no tinnitus or headaches.    Antibiotics:  Anti-infectives (From admission, onward)   Start     Dose/Rate Route Frequency Ordered Stop   02/07/19 2230  atovaquone (MEPRON) 750 MG/5ML suspension 750 mg  Status:  Discontinued     750 mg Oral Daily with breakfast 02/07/19 1729 02/10/19 1543   02/07/19 2200  acyclovir (ZOVIRAX) tablet 400 mg     400 mg Oral 2 times daily 02/07/19 1729     02/07/19 1800  flucytosine (ANCOBON) capsule 2,500 mg     25 mg/kg  95.7 kg Oral Every 6 hours 02/07/19 1528     02/07/19 1800  amphotericin B liposome (AMBISOME) 250 mg in dextrose 5 % 500 mL IVPB     3 mg/kg  82.1 kg (Adjusted) 250 mL/hr over 120 Minutes Intravenous Every 24 hours 02/07/19 1528        Medications: Scheduled Meds: . acyclovir  400 mg Oral BID  . Chlorhexidine Gluconate Cloth  6 each Topical Daily  . flucytosine  25 mg/kg Oral Q6H  . levETIRAcetam  500 mg Oral Q12H  . loratadine  10 mg Oral Daily  . pantoprazole  40 mg Oral Q supper  . potassium chloride  40 mEq Oral TID  . sodium chloride  1,000 mL Intravenous Q24H  . sodium chloride  500 mL Intravenous Q24H   Continuous Infusions: . amphotericin  B  Liposome (AMBISOME) ADULT IV 250 mg (02/23/19 1844)   PRN Meds:.acetaminophen, acetaminophen, diphenhydrAMINE **OR** diphenhydrAMINE, HYDROcodone-acetaminophen, meperidine (DEMEROL) injection, ondansetron **OR** ondansetron (ZOFRAN) IV, promethazine, sodium chloride flush  Objective:  Intake/Output Summary (Last 24 hours) at 02/24/2019 1038 Last data filed at 02/24/2019 0900 Gross per 24 hour  Intake 1840 ml  Output -  Net 1840 ml   Blood pressure 118/79, pulse 100, temperature 98.2 F (36.8 C), temperature source Oral, resp. rate 18, height 5\' 10"  (1.778 m), weight 94.1 kg, SpO2 97 %. Temp:  [97.7 F (36.5 C)-99.3 F (37.4 C)] 98.2 F (36.8 C) (10/15 0551) Pulse Rate:  [95-100] 100 (10/15 0551) Resp:  [18-20] 18  (10/15 0551) BP: (111-118)/(74-85) 118/79 (10/15 0551) SpO2:  [97 %-98 %] 97 % (10/15 0551)  Physical Exam: General: Alert and awake, oriented x3, not in any acute distress. HEENT: anicteric sclera, EOMI CVS: regular rate, normal  Chest: no wheezing, no respiratory distress Abdomen: soft non-distended Extremities: no edema or deformity noted bilaterally Skin: no rashes Neuro: nonfocal  CBC: CBC Latest Ref Rng & Units 02/24/2019 02/22/2019 02/21/2019  WBC 4.0 - 10.5 K/uL 10.1 10.9(H) 10.9(H)  Hemoglobin 13.0 - 17.0 g/dL 13.6 13.1 12.9(L)  Hematocrit 39.0 - 52.0 % 40.8 40.5 39.7  Platelets 150 - 400 K/uL 140(L) 117(L) 102(L)    BMET Recent Labs    02/23/19 0432 02/24/19 0443  NA 139 136  K 4.6 4.2  CL 107 107  CO2 21* 20*  GLUCOSE 119* 124*  BUN 5* 6  CREATININE 1.09 1.33*  CALCIUM 8.6* 8.6*    Liver Panel No results for input(s): PROT, ALBUMIN, AST, ALT, ALKPHOS, BILITOT, BILIDIR, IBILI in the last 72 hours.   Sedimentation Rate No results for input(s): ESRSEDRATE in the last 72 hours. C-Reactive Protein No results for input(s): CRP in the last 72 hours.  Micro Results: Recent Results (from the past 720 hour(s))  CSF culture     Status: Abnormal   Collection Time: 02/04/19  9:30 AM   Specimen: Lumbar  Puncture; Cerebrospinal Fluid  Result Value Ref Range Status   MICRO NUMBER: [id number]  Final   SPECIMEN QUALITY: Adequate  Final   Source CEREBROSPINAL FLUID (CSF)  Final   STATUS: FINAL  Final   GRAM STAIN: (A)  Final    Many White blood cells seen No organisms seen Gram stain prepared by cytospin   ISOLATE 1: Cryptococcus neoformans (AA)  Final    Comment: Scant growth of Cryptococcus neoformans   MYCOBACTERIA, CULTURE, WITH FLUOROCHROME SMEAR     Status: None (Preliminary result)   Collection Time: 02/04/19  9:30 AM  Result Value Ref Range Status   MICRO NUMBER: [id number]  Preliminary   SPECIMEN QUALITY: Adequate  Preliminary   Source: CEREBROSPINAL FLUID  (CSF)  Preliminary   STATUS: PRELIMINARY  Preliminary   SMEAR: No acid fast bacilli seen.  Preliminary   RESULT:   Preliminary    Culture results to follow. Final reports of negative cultures can be expected in approximately six weeks. Positive cultures are reported immediately.  Culture, blood (Routine x 2)     Status: None   Collection Time: 02/07/19  1:50 PM   Specimen: BLOOD LEFT ARM  Result Value Ref Range Status   Specimen Description BLOOD LEFT ARM  Final   Special Requests   Final    BOTTLES DRAWN AEROBIC AND ANAEROBIC Blood Culture results may not be optimal due to an excessive volume of blood received in culture bottles   Culture   Final    NO GROWTH 5 DAYS Performed at [facility], [place] [address] [place], [place] [address]    Report Status 02/12/2019 FINAL  Final  Culture, blood (Routine x 2)     Status: None   Collection Time: 02/07/19  3:06 PM   Specimen: BLOOD RIGHT ARM  Result Value Ref Range Status   Specimen Description BLOOD RIGHT ARM  Final   Special Requests   Final    BOTTLES DRAWN AEROBIC AND ANAEROBIC Blood Culture results may not be optimal due to an excessive volume of blood received in culture bottles   Culture   Final    NO GROWTH 5 DAYS Performed at [facility], [place] [address], [place], [place] [address]    Report Status 02/12/2019 FINAL  Final  SARS CORONAVIRUS 2 (TAT 6-24 HRS) Nasopharyngeal Nasopharyngeal Swab     Status: None   Collection Time: 02/07/19  3:54 PM   Specimen: Nasopharyngeal Swab  Result Value Ref Range Status   SARS Coronavirus 2 NEGATIVE NEGATIVE Final    Comment: (NOTE) SARS-CoV-2 target nucleic acids are NOT DETECTED. The SARS-CoV-2 RNA is generally detectable in upper and lower respiratory specimens during the acute phase of infection. Negative results do not preclude SARS-CoV-2 infection, do not rule out co-infections with other pathogens, and should not be used as the sole basis for treatment or other patient  management decisions. Negative results must be combined with clinical observations, patient history, and epidemiological information. The expected result is Negative. Fact Sheet for Patients: SugarRoll.be Fact Sheet for Healthcare Providers: https://www.woods-mathews.com/ This test is not yet approved or cleared by the [place] FDA and  has been authorized for detection and/or diagnosis of SARS-CoV-2 by FDA under an Emergency Use Authorization (EUA). This EUA will remain  in effect (meaning this test can be used) for the duration of the COVID-19 declaration under Section 56 4(b)(1) of the Act, 21 U.S.C. section 360bbb-3(b)(1), unless the authorization is terminated or revoked sooner. Performed  at [facility], [place] [address], [place], [place] [address]   CSF culture     Status: None   Collection Time: 02/09/19 11:36 AM   Specimen: PATH Cytology CSF; Cerebrospinal Fluid  Result Value Ref Range Status   Specimen Description CSF  Final   Special Requests NONE  Final   Gram Stain   Final    WBC PRESENT,BOTH PMN AND MONONUCLEAR NO ORGANISMS SEEN CYTOSPIN SMEAR    Culture   Final    NO GROWTH 3 DAYS Performed at [facility], [place] [address] [place], [place] [address]    Report Status 02/13/2019 FINAL  Final  CSF culture with Stat gram stain     Status: None   Collection Time: 02/11/19 11:32 AM   Specimen: CSF; Cerebrospinal Fluid  Result Value Ref Range Status   Specimen Description CSF  Final   Special Requests Immunocompromised  Final   Gram Stain   Final    WBC PRESENT, PREDOMINANTLY MONONUCLEAR NO ORGANISMS SEEN CYTOSPIN SMEAR    Culture   Final    NO GROWTH Performed at [facility], [place] [address] [place], [place] [address]    Report Status 02/14/2019 FINAL  Final  CSF culture     Status: None   Collection Time: 02/12/19 11:14 AM   Specimen: PATH Cytology CSF; Cerebrospinal Fluid  Result Value Ref  Range Status   Specimen Description CSF  Final   Special Requests NONE  Final   Gram Stain   Final    WBC PRESENT, PREDOMINANTLY MONONUCLEAR NO ORGANISMS SEEN CYTOSPIN SMEAR    Culture   Final    NO GROWTH 3 DAYS Performed at [facility], [place] [address], [place], [place] [address]    Report Status 02/15/2019 FINAL  Final  Fungus Culture With Stain     Status: None (Preliminary result)   Collection Time: 02/14/19  7:29 PM  Result Value Ref Range Status   Fungus Stain Final report  Final    Comment: (NOTE) Performed At: [facility] [place], [place] [id number] Rush Farmer MD [id number]    Fungus (Mycology) Culture PENDING  Incomplete   Fungal Source PENDING  Incomplete  Fungus Culture Result     Status: None   Collection Time: 02/14/19  7:29 PM  Result Value Ref Range Status   Result 1 Comment  Final    Comment: (NOTE) KOH/Calcofluor preparation:  no fungus observed. Performed At: [facility] [place], [place] [id number] Rush Farmer MD [id number]   CSF culture with Stat gram stain     Status: None   Collection Time: 02/14/19  7:53 PM   Specimen: CSF; Cerebrospinal Fluid  Result Value Ref Range Status   Specimen Description CSF  Final   Special Requests Normal  Final   Gram Stain   Final    WBC PRESENT, PREDOMINANTLY PMN NO ORGANISMS SEEN CYTOSPIN SMEAR    Culture   Final    NO GROWTH 3 DAYS Performed at [facility], [address] [place], [place] [address]    Report Status 02/18/2019 FINAL  Final  Surgical pcr screen     Status: None   Collection Time: 02/16/19  8:46 AM   Specimen: Nasal Mucosa; Nasal Swab  Result Value Ref Range Status   MRSA, PCR NEGATIVE NEGATIVE Final   Staphylococcus aureus NEGATIVE NEGATIVE Final    Comment: (NOTE) The Xpert SA Assay (FDA approved for  NASAL specimens in patients 10 years of age and older), is one component of a comprehensive surveillance program.  It is not intended to diagnose infection nor to guide or monitor treatment. Performed at [facility], [place] [address] [place], [place] [address]     Studies/Results: No results found.   Assessment/Plan:  INTERVAL HISTORY:  Platelets continue to rebound today 140<<117<<102.   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  Currently on flucytosine and L-ampho with pre/post hydration per pharm. Cr increased today to 1.33. Electrolytes stable - Mg 1.9 and K 4.2. Has a PICC line placed in the L upper extremity.  Pt understanding today that he will need to remain in the hospital for the remaining 10 days of his induction course.   Plan 1.Continue antifungals with pre/post hydration 2. Electrolyte replacement and monitoring per pharmacy protocol    LOS: 17 days   Ladona Horns 02/24/2019, 10:38 AM

## 2019-02-25 LAB — CBC
HCT: 42.4 % (ref 39.0–52.0)
Hemoglobin: 13.5 g/dL (ref 13.0–17.0)
MCH: 26.6 pg (ref 26.0–34.0)
MCHC: 31.8 g/dL (ref 30.0–36.0)
MCV: 83.5 fL (ref 80.0–100.0)
Platelets: 155 10*3/uL (ref 150–400)
RBC: 5.08 MIL/uL (ref 4.22–5.81)
RDW: 13.4 % (ref 11.5–15.5)
WBC: 8.9 10*3/uL (ref 4.0–10.5)
nRBC: 0 % (ref 0.0–0.2)

## 2019-02-25 LAB — BASIC METABOLIC PANEL
Anion gap: 10 (ref 5–15)
BUN: 11 mg/dL (ref 6–20)
CO2: 20 mmol/L — ABNORMAL LOW (ref 22–32)
Calcium: 9 mg/dL (ref 8.9–10.3)
Chloride: 107 mmol/L (ref 98–111)
Creatinine, Ser: 1.41 mg/dL — ABNORMAL HIGH (ref 0.61–1.24)
GFR calc Af Amer: >60 mL/min (ref >60)
GFR calc non Af Amer: 59 mL/min — ABNORMAL LOW (ref >60)
Glucose, Bld: 115 mg/dL — ABNORMAL HIGH (ref 70–99)
Potassium: 4.9 mmol/L (ref 3.5–5.1)
Sodium: 137 mmol/L (ref 135–145)

## 2019-02-25 LAB — MISC LABCORP TEST (SEND OUT): Labcorp test code: 9985

## 2019-02-25 LAB — MAGNESIUM: Magnesium: 2 mg/dL (ref 1.7–2.4)

## 2019-02-25 MED ORDER — POTASSIUM CHLORIDE CRYS ER 20 MEQ PO TBCR
40.0000 meq | EXTENDED_RELEASE_TABLET | Freq: Two times a day (BID) | ORAL | Status: DC
  Administered 2019-02-25 – 2019-02-27 (×5): 40 meq via ORAL
  Filled 2019-02-25 (×4): qty 2

## 2019-02-25 MED ORDER — SODIUM CHLORIDE 0.9 % IV BOLUS FOR AMBISOME
1000.0000 mL | INTRAVENOUS | Status: DC
  Administered 2019-02-25 – 2019-03-03 (×6): 1000 mL via INTRAVENOUS

## 2019-02-25 NOTE — Progress Notes (Addendum)
PROGRESS NOTE    David Whitehead  [id number] DOB: 1970-08-12 DOA: 02/07/2019 PCP: [name], MD   Brief Narrative: 48 year old male with history of CLL, hypertension, diabetes type 2 who presented with headache, diplopia. He underwent work-up for headache as an outpatient. He was seen by neurology and underwent lumbar puncture under fluoroscopy and was found to have elevated opening pressure of 55. He noted immediate improvement in headache after lumbar puncture. Cryptococcal antigen titers came out to be positive at 1: 64 and patient was sent to the emergency department. ID consulted after admission. Started on antibiotics. His symptoms of headache and diplopia have significantly improved. Hospital course remarkable for persistent elevated opening pressure so he has been undergoing series of lumbar punctures.  Subjective: Afebrile. Complains of some rash mostly in the forearm and thinks that worsened by amphotericin. 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  Followed by infectious disease and currently on induction therapy with amphotericin and flucytosine x4 wk. following which he needs to start on consolidative therapy with fluconazole 40 mg daily x8 weeks then 200 mg daily for a year.  Medication being dosed as per pharmacy with monitoring of electrolytes magnesium potassium and creatinine and has pre-/post hydration with therapy.MRI of the brain on 02/16/2019 shows abnormal leptomeningeal enhancement and cerebritis.  Continue on empiric Keppra.  CLL history and chemotherapy which is on hold.  Follow-up with Dr. Maylon Peppers  Hypertension pressure: Fairly stable. monitor.    Mild AKI creatinine up at 1.4 today, monitor closely.  encouraged po dressing.  Patient is getting pre and post infusion hydration  Rash on forearms unclear etiology question drug-induced.  Await ID inputs on it.    Body mass index is  29.76 kg/m.    DVT prophylaxis: SCD/ambulating Code Status: Full code  family Communication: plan of care discussed with patient in detail.  Offered to call his wife . But he reported he has already talked to his wife and no need to call her wife. Disposition Plan: Remains inpatient pending completion on anti cryptococcal treatment with  induction therapy   Consultants: Neurosurgery, infectious disease  Procedures: Lumbar puncture  Microbiology: CSF culture no growth to date. COVID-19 -9/28  Antimicrobials: Anti-infectives (From admission, onward)   Start     Dose/Rate Route Frequency Ordered Stop   02/07/19 2230  atovaquone (MEPRON) 750 MG/5ML suspension 750 mg  Status:  Discontinued     750 mg Oral Daily with breakfast 02/07/19 1729 02/10/19 1543   02/07/19 2200  acyclovir (ZOVIRAX) tablet 400 mg     400 mg Oral 2 times daily 02/07/19 1729     02/07/19 1800  flucytosine (ANCOBON) capsule 2,500 mg     25 mg/kg  95.7 kg Oral Every 6 hours 02/07/19 1528     02/07/19 1800  amphotericin B liposome (AMBISOME) 250 mg in dextrose 5 % 500 mL IVPB     3 mg/kg  82.1 kg (Adjusted) 250 mL/hr over 120 Minutes Intravenous Every 24 hours 02/07/19 1528         Objective: Vitals:   02/24/19 2048 02/25/19 0528 02/25/19 1023 02/25/19 1023  BP: 114/72 111/70 118/72 118/72  Pulse: 91 98 96 94  Resp: 17 17 (!) 24 (!) 24  Temp: 98.2 F (36.8 C) 98.5 F (36.9 C) 98.1 F (36.7 C) 98.1 F (36.7 C)  TempSrc: Oral Oral Oral Oral  SpO2: 100% 98% 98% 98%  Weight:  Height:        Intake/Output Summary (Last 24 hours) at 02/25/2019 1502 Last data filed at 02/25/2019 1438 Gross per 24 hour  Intake 1046.93 ml  Output 0 ml  Net 1046.93 ml   Filed Weights   02/15/19 0514 02/18/19 0659 02/20/19 0441  Weight: 92.4 kg 92.4 kg 94.1 kg   Weight change:   Body mass index is 29.76 kg/m.  Intake/Output from previous day: 10/15 0701 - 10/16 0700 In: 1286.9 [P.O.:720; I.V.:66.9; IV  Piggyback:500] Out: 0  Intake/Output this shift: Total I/O In: 240 [P.O.:240] Out: -   Examination:  General exam: Appears calm and comfortable,NAD, on RA. HEENT:PERRL,Oral mucosa moist, Ear/Nose normal on gross exam Respiratory system: Bilateral equal air entry, normal vesicular breath sounds, no wheezes or crackles  Cardiovascular system: S1 & S2 heard,No JVD, murmurs. Gastrointestinal system: Abdomen is  Soft, shunt site with healing incision. non tender, non distended, BS +  Nervous System:Alert and oriented. No focal neurological deficits/moving extremities, sensation intact. Extremities: No edema, no clubbing, distal peripheral pulses palpable. Skin: Rash present mostly in the forearm small macular lesions, no icterus MSK: Normal muscle bulk,tone ,power. Surgical site on head- x2 with staples-dry. Healing.  Medications:  Scheduled Meds: . acyclovir  400 mg Oral BID  . Chlorhexidine Gluconate Cloth  6 each Topical Daily  . flucytosine  25 mg/kg Oral Q6H  . loratadine  10 mg Oral Daily  . pantoprazole  40 mg Oral Q supper  . potassium chloride  40 mEq Oral BID  . sodium chloride  1,000 mL Intravenous Q24H  . sodium chloride  1,000 mL Intravenous Q24H   Continuous Infusions: . sodium chloride 1,000 mL (02/24/19 2309)  . amphotericin  B  Liposome (AMBISOME) ADULT IV Stopped (02/24/19 2205)    Data Reviewed: I have personally reviewed following labs and imaging studies  CBC: Recent Labs  Lab 02/20/19 0345 02/21/19 0353 02/22/19 0433 02/24/19 0443 02/25/19 0504  WBC 9.6 10.9* 10.9* 10.1 8.9  HGB 12.3* 12.9* 13.1 13.6 13.5  HCT 37.7* 39.7 40.5 40.8 42.4  MCV 84.3 84.3 83.7 82.8 83.5  PLT 101* 102* 117* 140* [id number]   Basic Metabolic Panel: Recent Labs  Lab 02/21/19 0353 02/22/19 0433 02/23/19 0432 02/24/19 0443 02/25/19 0504  NA 141 140 139 136 137  K 4.1 3.8 4.6 4.2 4.9  CL 109 108 107 107 107  CO2 23 21* 21* 20* 20*  GLUCOSE 116* 117* 119* 124* 115*  BUN  6 7 5* 6 11  CREATININE 1.20 1.16 1.09 1.33* 1.41*  CALCIUM 8.5* 8.7* 8.6* 8.6* 9.0  MG 1.7 1.6* 2.1 1.9 2.0   GFR: Estimated Creatinine Clearance: 75.4 mL/min (A) (by C-G formula based on SCr of 1.41 mg/dL (H)). Liver Function Tests: Recent Labs  Lab 02/19/19 0422  AST 12*  ALT 19  ALKPHOS 41  BILITOT 0.7  PROT 4.7*  ALBUMIN 3.0*   No results for input(s): LIPASE, AMYLASE in the last 168 hours. No results for input(s): AMMONIA in the last 168 hours. Coagulation Profile: No results for input(s): INR, PROTIME in the last 168 hours. Cardiac Enzymes: No results for input(s): CKTOTAL, CKMB, CKMBINDEX, TROPONINI in the last 168 hours. BNP (last 3 results) No results for input(s): PROBNP in the last 8760 hours. HbA1C: No results for input(s): HGBA1C in the last 72 hours. CBG: No results for input(s): GLUCAP in the last 168 hours. Lipid Profile: No results for input(s): CHOL, HDL, LDLCALC, TRIG, CHOLHDL, LDLDIRECT in the last  72 hours. Thyroid Function Tests: No results for input(s): TSH, T4TOTAL, FREET4, T3FREE, THYROIDAB in the last 72 hours. Anemia Panel: No results for input(s): VITAMINB12, FOLATE, FERRITIN, TIBC, IRON, RETICCTPCT in the last 72 hours. Sepsis Labs: No results for input(s): PROCALCITON, LATICACIDVEN in the last 168 hours.  Recent Results (from the past 240 hour(s))  Surgical pcr screen     Status: None   Collection Time: 02/16/19  8:46 AM   Specimen: Nasal Mucosa; Nasal Swab  Result Value Ref Range Status   MRSA, PCR NEGATIVE NEGATIVE Final   Staphylococcus aureus NEGATIVE NEGATIVE Final    Comment: (NOTE) The Xpert SA Assay (FDA approved for NASAL specimens in patients 67 years of age and older), is one component of a comprehensive surveillance program. It is not intended to diagnose infection nor to guide or monitor treatment. Performed at [facility], [place] [address], [place], [place] [address]       Radiology Studies: No results found.     LOS: 18 days   Time spent: More than 50% of that time was spent in counseling and/or coordination of care.  Antonieta Pert, MD [facility]  02/25/2019, 3:02 PM

## 2019-02-25 NOTE — Plan of Care (Signed)
  Problem: Activity: Goal: Risk for activity intolerance will decrease Outcome: Progressing   

## 2019-02-25 NOTE — Progress Notes (Signed)
Pharmacy Antibiotic Note  David Whitehead is a 48 y.o. 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 Pharmacy has been consulted for amphotericin B liposome dosing. Ambisome 3 mg/kg and flucytosine 25 mg/kg started in this patient with suspected cryptococcal meningitis. Normal saline ordered to be given an hour before and an hour after the Ambisome infusion with D5 flushes administered inbetween. The pharmacy team will monitor the patients renal function, potassium and magnesium and make adjustments per protocol.  Potassium today is . Mg is at the higher end of normal today at 4.9 we will decrease the frequency of his potassium supplementation from TID to BID. His magnesium is stable at 2.0.  SCr is increased today from 1.33 to 1.41. Will watch renal function closely and increase his post dose fluids to 1L and continue the pre dose fluid at 1L. We will monitor his platelets closely, but it is reassuring that they are continuing to trend up. We will continue his flucytosine at the current dose and follow the trend.   Plan: Continue Ambisome 250 mg q24h (3 mg/kg) Continue Flucytosine 2,500 mg q6h (25 mg/kg) Decrease potassium to 40 mEq BID Continue pre-dose fluid at 1000 mL and increase post dose fluid to 1000 mL Monitor Scr and CBC   Height: 5\' 10"  (177.8 cm) Weight: 207 lb 6.4 oz (94.1 kg) IBW/kg (Calculated) : 73  Temp (24hrs), Avg:98.1 F (36.7 C), Min:97.6 F (36.4 C), Max:98.5 F (36.9 C)  Recent Labs  Lab 02/20/19 0345 02/21/19 0353 02/22/19 0433 02/23/19 0432 02/24/19 0443 02/25/19 0504  WBC 9.6 10.9* 10.9*  --  10.1 8.9  CREATININE 1.10 1.20 1.16 1.09 1.33* 1.41*    Estimated Creatinine Clearance: 75.4 mL/min (A) (by C-G formula based on SCr of 1.41 mg/dL (H)).    Allergies  Allergen Reactions  . Bactrim [Sulfamethoxazole-Trimethoprim] Rash  . Latex Other (See Comments)    Took skin off when removed tape  . Amoxicillin Rash    Has patient had a PCN  reaction causing immediate rash, facial/tongue/throat swelling, SOB or lightheadedness with hypotension: No Has patient had a PCN reaction causing severe rash involving mucus membranes or skin necrosis: No Has patient had a PCN reaction that required hospitalization: No Has patient had a PCN reaction occurring within the last 10 years: No If all of the above answers are "NO", then may proceed with Cephalosporin use.     Antimicrobials this admission: Ambisome  9/28>> Flucytosine 9/28>>   Thank you for allowing pharmacy to be a part of this patient's care.  Nicoletta Dress, PharmD PGY2 Infectious Disease Pharmacy Resident  02/25/2019 8:38 AM

## 2019-02-25 NOTE — Progress Notes (Signed)
Subjective: Pt complaining of itching rash on arms, legs, and trunk. States it began 3-4 days ago, though was worst yesterday evening.   Antibiotics:  Anti-infectives (From admission, onward)   Start     Dose/Rate Route Frequency Ordered Stop   02/07/19 2230  atovaquone (MEPRON) 750 MG/5ML suspension 750 mg  Status:  Discontinued     750 mg Oral Daily with breakfast 02/07/19 1729 02/10/19 1543   02/07/19 2200  acyclovir (ZOVIRAX) tablet 400 mg     400 mg Oral 2 times daily 02/07/19 1729     02/07/19 1800  flucytosine (ANCOBON) capsule 2,500 mg     25 mg/kg  95.7 kg Oral Every 6 hours 02/07/19 1528     02/07/19 1800  amphotericin B liposome (AMBISOME) 250 mg in dextrose 5 % 500 mL IVPB     3 mg/kg  82.1 kg (Adjusted) 250 mL/hr over 120 Minutes Intravenous Every 24 hours 02/07/19 1528        Medications: Scheduled Meds: . acyclovir  400 mg Oral BID  . Chlorhexidine Gluconate Cloth  6 each Topical Daily  . flucytosine  25 mg/kg Oral Q6H  . loratadine  10 mg Oral Daily  . pantoprazole  40 mg Oral Q supper  . potassium chloride  40 mEq Oral BID  . sodium chloride  1,000 mL Intravenous Q24H  . sodium chloride  1,000 mL Intravenous Q24H   Continuous Infusions: . sodium chloride 1,000 mL (02/24/19 2309)  . amphotericin  B  Liposome (AMBISOME) ADULT IV Stopped (02/24/19 2205)   PRN Meds:.sodium chloride, acetaminophen, acetaminophen, diphenhydrAMINE **OR** diphenhydrAMINE, HYDROcodone-acetaminophen, meperidine (DEMEROL) injection, ondansetron **OR** ondansetron (ZOFRAN) IV, promethazine, sodium chloride flush  Objective:  Intake/Output Summary (Last 24 hours) at 02/25/2019 1204 Last data filed at 02/25/2019 0554 Gross per 24 hour  Intake 1046.93 ml  Output 0 ml  Net 1046.93 ml   Blood pressure 118/72, pulse 94, temperature 98.1 F (36.7 C), temperature source Oral, resp. rate (!) 24, height 5\' 10"  (1.778 m), weight 94.1 kg, SpO2 98 %. Temp:  [98.1 F (36.7  C)-98.5 F (36.9 C)] 98.1 F (36.7 C) (10/16 1023) Pulse Rate:  [91-98] 94 (10/16 1023) Resp:  [17-24] 24 (10/16 1023) BP: (111-118)/(70-72) 118/72 (10/16 1023) SpO2:  [98 %-100 %] 98 % (10/16 1023)  Physical Exam: General: Alert and awake, oriented x3, not in any acute distress. HEENT: anicteric sclera, EOMI CVS: regular rate, normal  Chest: no wheezing, no respiratory distress Abdomen: soft non-distended Extremities: no edema or deformity noted bilaterally Skin: faint maculopapular rash on bilateral forearms and lower abdomen Neuro: nonfocal  CBC: CBC Latest Ref Rng & Units 02/25/2019 02/24/2019 02/22/2019  WBC 4.0 - 10.5 K/uL 8.9 10.1 10.9(H)  Hemoglobin 13.0 - 17.0 g/dL 13.5 13.6 13.1  Hematocrit 39.0 - 52.0 % 42.4 40.8 40.5  Platelets 150 - 400 K/uL 155 140(L) 117(L)    BMET Recent Labs    02/24/19 0443 02/25/19 0504  NA 136 137  K 4.2 4.9  CL 107 107  CO2 20* 20*  GLUCOSE 124* 115*  BUN 6 11  CREATININE 1.33* 1.41*  CALCIUM 8.6* 9.0    Liver Panel  No results for input(s): PROT, ALBUMIN, AST, ALT, ALKPHOS, BILITOT, BILIDIR, IBILI in the last 72 hours.   Sedimentation Rate No results for input(s): ESRSEDRATE in the last 72 hours. C-Reactive Protein No results for input(s): CRP in the last 72 hours.  Micro Results: Recent Results (from the past  720 hour(s))  CSF culture     Status: Abnormal   Collection Time: 02/04/19  9:30 AM   Specimen: Lumbar Puncture; Cerebrospinal Fluid  Result Value Ref Range Status   MICRO NUMBER: [id number]  Final   SPECIMEN QUALITY: Adequate  Final   Source CEREBROSPINAL FLUID (CSF)  Final   STATUS: FINAL  Final   GRAM STAIN: (A)  Final    Many White blood cells seen No organisms seen Gram stain prepared by cytospin   ISOLATE 1: Cryptococcus neoformans (AA)  Final    Comment: Scant growth of Cryptococcus neoformans   MYCOBACTERIA, CULTURE, WITH FLUOROCHROME SMEAR     Status: None (Preliminary result)   Collection Time:  02/04/19  9:30 AM  Result Value Ref Range Status   MICRO NUMBER: [id number]  Preliminary   SPECIMEN QUALITY: Adequate  Preliminary   Source: CEREBROSPINAL FLUID (CSF)  Preliminary   STATUS: PRELIMINARY  Preliminary   SMEAR: No acid fast bacilli seen.  Preliminary   RESULT:   Preliminary    Culture results to follow. Final reports of negative cultures can be expected in approximately six weeks. Positive cultures are reported immediately.  Culture, blood (Routine x 2)     Status: None   Collection Time: 02/07/19  1:50 PM   Specimen: BLOOD LEFT ARM  Result Value Ref Range Status   Specimen Description BLOOD LEFT ARM  Final   Special Requests   Final    BOTTLES DRAWN AEROBIC AND ANAEROBIC Blood Culture results may not be optimal due to an excessive volume of blood received in culture bottles   Culture   Final    NO GROWTH 5 DAYS Performed at [facility], [place] [address] [place], [place] [address]    Report Status 02/12/2019 FINAL  Final  Culture, blood (Routine x 2)     Status: None   Collection Time: 02/07/19  3:06 PM   Specimen: BLOOD RIGHT ARM  Result Value Ref Range Status   Specimen Description BLOOD RIGHT ARM  Final   Special Requests   Final    BOTTLES DRAWN AEROBIC AND ANAEROBIC Blood Culture results may not be optimal due to an excessive volume of blood received in culture bottles   Culture   Final    NO GROWTH 5 DAYS Performed at [facility], [place] [address] [place], [place] [address]    Report Status 02/12/2019 FINAL  Final  SARS CORONAVIRUS 2 (TAT 6-24 HRS) Nasopharyngeal Nasopharyngeal Swab     Status: None   Collection Time: 02/07/19  3:54 PM   Specimen: Nasopharyngeal Swab  Result Value Ref Range Status   SARS Coronavirus 2 NEGATIVE NEGATIVE Final    Comment: (NOTE) SARS-CoV-2 target nucleic acids are NOT DETECTED. The SARS-CoV-2 RNA is generally detectable in upper and lower respiratory specimens during the acute phase of infection. Negative results  do not preclude SARS-CoV-2 infection, do not rule out co-infections with other pathogens, and should not be used as the sole basis for treatment or other patient management decisions. Negative results must be combined with clinical observations, patient history, and epidemiological information. The expected result is Negative. Fact Sheet for Patients: SugarRoll.be Fact Sheet for Healthcare Providers: https://www.woods-mathews.com/ This test is not yet approved or cleared by the [place] FDA and  has been authorized for detection and/or diagnosis of SARS-CoV-2 by FDA under an Emergency Use Authorization (EUA). This EUA will remain  in effect (meaning this test can be used) for the duration of  the COVID-19 declaration under Section 56 4(b)(1) of the Act, 21 U.S.C. section 360bbb-3(b)(1), unless the authorization is terminated or revoked sooner. Performed at [facility], [place] [address], [place], [place] [address]   CSF culture     Status: None   Collection Time: 02/09/19 11:36 AM   Specimen: PATH Cytology CSF; Cerebrospinal Fluid  Result Value Ref Range Status   Specimen Description CSF  Final   Special Requests NONE  Final   Gram Stain   Final    WBC PRESENT,BOTH PMN AND MONONUCLEAR NO ORGANISMS SEEN CYTOSPIN SMEAR    Culture   Final    NO GROWTH 3 DAYS Performed at [facility], [place] [address] [place], [place] [address]    Report Status 02/13/2019 FINAL  Final  CSF culture with Stat gram stain     Status: None   Collection Time: 02/11/19 11:32 AM   Specimen: CSF; Cerebrospinal Fluid  Result Value Ref Range Status   Specimen Description CSF  Final   Special Requests Immunocompromised  Final   Gram Stain   Final    WBC PRESENT, PREDOMINANTLY MONONUCLEAR NO ORGANISMS SEEN CYTOSPIN SMEAR    Culture   Final    NO GROWTH Performed at [facility], [place] [address], [place], [place] [address]    Report Status  02/14/2019 FINAL  Final  CSF culture     Status: None   Collection Time: 02/12/19 11:14 AM   Specimen: PATH Cytology CSF; Cerebrospinal Fluid  Result Value Ref Range Status   Specimen Description CSF  Final   Special Requests NONE  Final   Gram Stain   Final    WBC PRESENT, PREDOMINANTLY MONONUCLEAR NO ORGANISMS SEEN CYTOSPIN SMEAR    Culture   Final    NO GROWTH 3 DAYS Performed at [facility], [place] [name] [place], [place] [address]    Report Status 02/15/2019 FINAL  Final  Fungus Culture With Stain     Status: None (Preliminary result)   Collection Time: 02/14/19  7:29 PM  Result Value Ref Range Status   Fungus Stain Final report  Final    Comment: (NOTE) Performed At: [facility] [place], [place] [id number] Rush Farmer MD [id number]    Fungus (Mycology) Culture PENDING  Incomplete   Fungal Source PENDING  Incomplete  Fungus Culture Result     Status: None   Collection Time: 02/14/19  7:29 PM  Result Value Ref Range Status   Result 1 Comment  Final    Comment: (NOTE) KOH/Calcofluor preparation:  no fungus observed. Performed At: [facility] [place], [place] [id number] Rush Farmer MD [id number]   CSF culture with Stat gram stain     Status: None   Collection Time: 02/14/19  7:53 PM   Specimen: CSF; Cerebrospinal Fluid  Result Value Ref Range Status   Specimen Description CSF  Final   Special Requests Normal  Final   Gram Stain   Final    WBC PRESENT, PREDOMINANTLY PMN NO ORGANISMS SEEN CYTOSPIN SMEAR    Culture   Final    NO GROWTH 3 DAYS Performed at [facility], [address] [place], [place] [address]    Report Status 02/18/2019 FINAL  Final  Surgical pcr screen     Status: None   Collection Time: 02/16/19  8:46 AM   Specimen: Nasal Mucosa; Nasal Swab  Result Value Ref Range Status   MRSA,  PCR NEGATIVE NEGATIVE Final   Staphylococcus aureus NEGATIVE NEGATIVE Final     Comment: (NOTE) The Xpert SA Assay (FDA approved for NASAL specimens in patients 33 years of age and older), is one component of a comprehensive surveillance program. It is not intended to diagnose infection nor to guide or monitor treatment. Performed at [facility], [place] [address] [place], [place] [address]     Studies/Results: No results found.   Assessment/Plan:  INTERVAL HISTORY:  Cr bumped today to 1.41 (previously 1.33<<1.09) K on the upper limit of normal today at 4.9 Mg 2.0 Platelets normalized today to 155   Principal Problem:   Cryptococcal meningitis (HCC) Active Problems:   CLL (chronic lymphocytic leukemia) (HCC)   Pulmonary nodule   Hypertension   Brain lesion   Meningitis, cryptococcal (HCC)   Tor Sammon a 48 y.o.malewith HIV-negativemalewith cryptococcal meningitis likely secondary to chemotherapy for CLL.Chemo currently on hold.  Currently on flucytosine and L-ampho with pre/post hydration per pharm. Cr bumped today to 1.41. Per pharm, will increase his post dose fluids to 1L. Potassium at the upper limit of normal today at 4.9, so will decrease supplementation to BID dosing.   Would not think pt's rash related to amphotericin due to multiple weeks of therapy so far. Favor CHG wipes causing a contact dermatitis, which can take time to resolve after cessation of the wipes 2 days ago. Will treat through this rash, and pt can receive diphenhydramine or hydroxyzine for itching.   Plan 1.Continue antifungals with pre/post hydration per pharm 2. Electrolyte replacement and monitoring per pharmacy protocol 3. Can use diphenhydramine or hydroxyzine PRN for itching     LOS: 18 days   Ladona Horns 02/25/2019, 12:04 PM

## 2019-02-26 LAB — CBC
HCT: 42.4 % (ref 39.0–52.0)
Hemoglobin: 13.7 g/dL (ref 13.0–17.0)
MCH: 26.8 pg (ref 26.0–34.0)
MCHC: 32.3 g/dL (ref 30.0–36.0)
MCV: 82.8 fL (ref 80.0–100.0)
Platelets: 203 10*3/uL (ref 150–400)
RBC: 5.12 MIL/uL (ref 4.22–5.81)
RDW: 13.4 % (ref 11.5–15.5)
WBC: 11.2 10*3/uL — ABNORMAL HIGH (ref 4.0–10.5)
nRBC: 0 % (ref 0.0–0.2)

## 2019-02-26 LAB — MAGNESIUM: Magnesium: 2 mg/dL (ref 1.7–2.4)

## 2019-02-26 LAB — BASIC METABOLIC PANEL
Anion gap: 10 (ref 5–15)
BUN: 11 mg/dL (ref 6–20)
CO2: 21 mmol/L — ABNORMAL LOW (ref 22–32)
Calcium: 8.9 mg/dL (ref 8.9–10.3)
Chloride: 109 mmol/L (ref 98–111)
Creatinine, Ser: 1.52 mg/dL — ABNORMAL HIGH (ref 0.61–1.24)
GFR calc Af Amer: >60 mL/min (ref >60)
GFR calc non Af Amer: 54 mL/min — ABNORMAL LOW (ref >60)
Glucose, Bld: 112 mg/dL — ABNORMAL HIGH (ref 70–99)
Potassium: 4.8 mmol/L (ref 3.5–5.1)
Sodium: 140 mmol/L (ref 135–145)

## 2019-02-26 MED ORDER — SODIUM CHLORIDE 0.9 % IV SOLN
INTRAVENOUS | Status: DC
  Administered 2019-02-26 – 2019-02-27 (×3): via INTRAVENOUS

## 2019-02-26 NOTE — Progress Notes (Signed)
PROGRESS NOTE    David Whitehead  [id number] DOB: 01-16-1971 DOA: 02/07/2019 PCP: [name], MD   Brief Narrative: 48 year old male with history of CLL, hypertension, diabetes type 2 who presented with headache, diplopia. He underwent work-up for headache as an outpatient. He was seen by neurology and underwent lumbar puncture under fluoroscopy and was found to have elevated opening pressure of 55. He noted immediate improvement in headache after lumbar puncture. Cryptococcal antigen titers came out to be positive at 1: 64 and patient was sent to the emergency department. ID consulted after admission. Started on antibiotics. His symptoms of headache and diplopia have significantly improved. Hospital course remarkable for persistent elevated opening pressure so he has been undergoing series of lumbar punctures.  Subjective: Rash better after benadryl prior to ampho last night he says No fever, no cough,headache.  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 Followed by infectious disease and currently on induction therapy with amphotericin and flucytosine x4 wk. following which he needs to start on consolidative therapy with fluconazole 40 mg daily x8 weeks then 200 mg daily for a year.  Medication being dosed as per pharmacy with monitoring of electrolytes magnesium potassium and creatinine and has pre-/post hydration with therapy.MRI of the brain on 02/16/2019 shows abnormal leptomeningeal enhancement and cerebritis.  Continue on empiric Keppra.   CLL history and chemotherapy which is on hold.  Follow-up with Dr. Maylon Peppers  Hypertension pressure: Fairly stable. monitor.    Mild AKI creatinine up at 1.5- aded iv nss at 100 ml/hr- cont and bmp in am.  Patient is getting pre and post infusion hydration  Rash on forearms- improving, use bandryl with amphotericin  Body mass index is 29.76 kg/m.    DVT prophylaxis:  SCD/ambulating Code Status: Full code  family Communication: plan of care discussed with patient in detail.  Offered to call his wife again- he declined and states she is coming to hospital today Disposition Plan: Remains inpatient pending completion on anti cryptococcal treatment with  induction therapy   Consultants: Neurosurgery, infectious disease  Procedures: Lumbar puncture  Microbiology: CSF culture no growth to date. COVID-19 -9/28  Antimicrobials: Anti-infectives (From admission, onward)   Start     Dose/Rate Route Frequency Ordered Stop   02/07/19 2230  atovaquone (MEPRON) 750 MG/5ML suspension 750 mg  Status:  Discontinued     750 mg Oral Daily with breakfast 02/07/19 1729 02/10/19 1543   02/07/19 2200  acyclovir (ZOVIRAX) tablet 400 mg     400 mg Oral 2 times daily 02/07/19 1729     02/07/19 1800  flucytosine (ANCOBON) capsule 2,500 mg     25 mg/kg  95.7 kg Oral Every 6 hours 02/07/19 1528     02/07/19 1800  amphotericin B liposome (AMBISOME) 250 mg in dextrose 5 % 500 mL IVPB     3 mg/kg  82.1 kg (Adjusted) 250 mL/hr over 120 Minutes Intravenous Every 24 hours 02/07/19 1528         Objective: Vitals:   02/25/19 1023 02/25/19 2129 02/26/19 0518 02/26/19 0847  BP: 118/72 123/77 128/77 114/76  Pulse: 94 98 92 94  Resp: (!) 24 16 16 18   Temp: 98.1 F (36.7 C) 98.4 F (36.9 C) 98.7 F (37.1 C) 98.4 F (36.9 C)  TempSrc: Oral Oral Oral Oral  SpO2: 98% 99% 97% 96%  Weight:      Height:        Intake/Output Summary (Last 24  hours) at 02/26/2019 1031 Last data filed at 02/25/2019 2200 Gross per 24 hour  Intake 1517.46 ml  Output 0 ml  Net 1517.46 ml   Filed Weights   02/15/19 0514 02/18/19 0659 02/20/19 0441  Weight: 92.4 kg 92.4 kg 94.1 kg   Weight change:   Body mass index is 29.76 kg/m.  Intake/Output from previous day: 10/16 0701 - 10/17 0700 In: 1517.5 [P.O.:600; I.V.:917.5] Out: 0  Intake/Output this shift: No intake/output data  recorded.  Examination:  General exam: Calm, comfortable, not in acute distress, older for age, average built.  HEENT:Oral mucosa moist, Ear/Nose WNL grossly, dentition normal. Respiratory system: Bilateral equal air entry, no crackles and wheezing, no use of accessory muscle, non tender on palpation. Cardiovascular system: regular rate and rhythm, S1 & S2 heard, No JVD/murmurs. Gastrointestinal system: shunt site in abdomen with glue, Abdomen soft, non-tender, non-distended, BS +. No hepatosplenomegaly palpable. Nervous System:Alert, awake and oriented at baseline. Able to move UE and LE, sensation intact. Extremities: No edema, distal peripheral pulses palpable.  Skin: No rashes,no icterus. Scalp insicion stapled and healing MSK: Normal muscle bulk,tone, power  Medications:  Scheduled Meds: . acyclovir  400 mg Oral BID  . Chlorhexidine Gluconate Cloth  6 each Topical Daily  . flucytosine  25 mg/kg Oral Q6H  . loratadine  10 mg Oral Daily  . pantoprazole  40 mg Oral Q supper  . potassium chloride  40 mEq Oral BID  . sodium chloride  1,000 mL Intravenous Q24H  . sodium chloride  1,000 mL Intravenous Q24H   Continuous Infusions: . sodium chloride 1,000 mL (02/24/19 2309)  . sodium chloride 100 mL/hr at 02/26/19 0859  . amphotericin  B  Liposome (AMBISOME) ADULT IV 250 mg (02/25/19 1819)    Data Reviewed: I have personally reviewed following labs and imaging studies  CBC: Recent Labs  Lab 02/21/19 0353 02/22/19 0433 02/24/19 0443 02/25/19 0504 02/26/19 0325  WBC 10.9* 10.9* 10.1 8.9 11.2*  HGB 12.9* 13.1 13.6 13.5 13.7  HCT 39.7 40.5 40.8 42.4 42.4  MCV 84.3 83.7 82.8 83.5 82.8  PLT 102* 117* 140* 155 [id number]   Basic Metabolic Panel: Recent Labs  Lab 02/22/19 0433 02/23/19 0432 02/24/19 0443 02/25/19 0504 02/26/19 0325  NA 140 139 136 137 140  K 3.8 4.6 4.2 4.9 4.8  CL 108 107 107 107 109  CO2 21* 21* 20* 20* 21*  GLUCOSE 117* 119* 124* 115* 112*  BUN 7 5* 6 11  11   CREATININE 1.16 1.09 1.33* 1.41* 1.52*  CALCIUM 8.7* 8.6* 8.6* 9.0 8.9  MG 1.6* 2.1 1.9 2.0 2.0   GFR: Estimated Creatinine Clearance: 69.9 mL/min (A) (by C-G formula based on SCr of 1.52 mg/dL (H)). Liver Function Tests: No results for input(s): AST, ALT, ALKPHOS, BILITOT, PROT, ALBUMIN in the last 168 hours. No results for input(s): LIPASE, AMYLASE in the last 168 hours. No results for input(s): AMMONIA in the last 168 hours. Coagulation Profile: No results for input(s): INR, PROTIME in the last 168 hours. Cardiac Enzymes: No results for input(s): CKTOTAL, CKMB, CKMBINDEX, TROPONINI in the last 168 hours. BNP (last 3 results) No results for input(s): PROBNP in the last 8760 hours. HbA1C: No results for input(s): HGBA1C in the last 72 hours. CBG: No results for input(s): GLUCAP in the last 168 hours. Lipid Profile: No results for input(s): CHOL, HDL, LDLCALC, TRIG, CHOLHDL, LDLDIRECT in the last 72 hours. Thyroid Function Tests: No results for input(s): TSH, T4TOTAL, FREET4, T3FREE, THYROIDAB  in the last 72 hours. Anemia Panel: No results for input(s): VITAMINB12, FOLATE, FERRITIN, TIBC, IRON, RETICCTPCT in the last 72 hours. Sepsis Labs: No results for input(s): PROCALCITON, LATICACIDVEN in the last 168 hours.  No results found for this or any previous visit (from the past 240 hour(s)).    Radiology Studies: No results found.    LOS: 19 days   Time spent: More than 50% of that time was spent in counseling and/or coordination of care.  Antonieta Pert, MD [facility]  02/26/2019, 10:31 AM

## 2019-02-26 NOTE — Plan of Care (Signed)
  Problem: Activity: Goal: Risk for activity intolerance will decrease Outcome: Progressing   

## 2019-02-27 LAB — CBC
HCT: 37.9 % — ABNORMAL LOW (ref 39.0–52.0)
Hemoglobin: 12.1 g/dL — ABNORMAL LOW (ref 13.0–17.0)
MCH: 26.7 pg (ref 26.0–34.0)
MCHC: 31.9 g/dL (ref 30.0–36.0)
MCV: 83.5 fL (ref 80.0–100.0)
Platelets: 183 10*3/uL (ref 150–400)
RBC: 4.54 MIL/uL (ref 4.22–5.81)
RDW: 13.3 % (ref 11.5–15.5)
WBC: 8.4 10*3/uL (ref 4.0–10.5)
nRBC: 0 % (ref 0.0–0.2)

## 2019-02-27 LAB — BASIC METABOLIC PANEL
Anion gap: 7 (ref 5–15)
BUN: 7 mg/dL (ref 6–20)
CO2: 21 mmol/L — ABNORMAL LOW (ref 22–32)
Calcium: 8.2 mg/dL — ABNORMAL LOW (ref 8.9–10.3)
Chloride: 113 mmol/L — ABNORMAL HIGH (ref 98–111)
Creatinine, Ser: 1.45 mg/dL — ABNORMAL HIGH (ref 0.61–1.24)
GFR calc Af Amer: >60 mL/min (ref >60)
GFR calc non Af Amer: 57 mL/min — ABNORMAL LOW (ref >60)
Glucose, Bld: 106 mg/dL — ABNORMAL HIGH (ref 70–99)
Potassium: 4.3 mmol/L (ref 3.5–5.1)
Sodium: 141 mmol/L (ref 135–145)

## 2019-02-27 LAB — MAGNESIUM: Magnesium: 2 mg/dL (ref 1.7–2.4)

## 2019-02-27 MED ORDER — POTASSIUM CHLORIDE 20 MEQ/15ML (10%) PO SOLN
40.0000 meq | Freq: Two times a day (BID) | ORAL | Status: DC
  Filled 2019-02-27: qty 30

## 2019-02-27 MED ORDER — HYDROCORTISONE 1 % EX CREA
TOPICAL_CREAM | Freq: Two times a day (BID) | CUTANEOUS | Status: DC
  Filled 2019-02-27: qty 28

## 2019-02-27 MED ORDER — HYDROXYZINE HCL 25 MG PO TABS
25.0000 mg | ORAL_TABLET | Freq: Three times a day (TID) | ORAL | Status: DC | PRN
  Administered 2019-03-02 – 2019-03-03 (×3): 25 mg via ORAL
  Filled 2019-02-27 (×3): qty 1

## 2019-02-27 MED ORDER — SODIUM CHLORIDE 0.9 % IV SOLN
INTRAVENOUS | Status: AC
  Administered 2019-02-27 – 2019-02-28 (×3): via INTRAVENOUS

## 2019-02-27 MED ORDER — HYDROCORTISONE 1 % EX LOTN: TOPICAL_LOTION | Freq: Two times a day (BID) | CUTANEOUS | Status: DC

## 2019-02-27 MED ORDER — HYDROCORTISONE NA SUCCINATE PF 100 MG IJ SOLR
50.0000 mg | Freq: Every day | INTRAMUSCULAR | Status: DC
  Administered 2019-02-27 – 2019-03-03 (×5): 50 mg via INTRAVENOUS
  Filled 2019-02-27 (×5): qty 2

## 2019-02-27 MED ORDER — POTASSIUM CHLORIDE CRYS ER 20 MEQ PO TBCR
40.0000 meq | EXTENDED_RELEASE_TABLET | Freq: Two times a day (BID) | ORAL | Status: AC
  Administered 2019-02-27 – 2019-03-04 (×11): 40 meq via ORAL
  Filled 2019-02-27 (×10): qty 2

## 2019-02-27 MED ORDER — DEXTROSE 5 % IV SOLN
INTRAVENOUS | Status: DC
  Administered 2019-02-27: 22:00:00 via INTRAVENOUS
  Administered 2019-02-28: 10 mL via INTRAVENOUS

## 2019-02-27 MED ORDER — SODIUM CHLORIDE 0.9 % IV SOLN: INTRAVENOUS | Status: DC

## 2019-02-27 NOTE — Progress Notes (Addendum)
PROGRESS NOTE    David Whitehead  [id number] DOB: July 15, 1970 DOA: 02/07/2019 PCP: [name], MD   Brief Narrative: 48 year old male with history of CLL, hypertension, diabetes type 2 who presented with headache, diplopia. He underwent work-up for headache as an outpatient. He was seen by neurology and underwent lumbar puncture under fluoroscopy and was found to have elevated opening pressure of 55. He noted immediate improvement in headache after lumbar puncture. Cryptococcal antigen titers came out to be positive at 1: 64 and patient was sent to the emergency department. ID consulted after admission. Started on antibiotics. His symptoms of headache and diplopia have significantly improved. Hospital course remarkable for persistent elevated opening pressure so he has been undergoing series of lumbar punctures.  Subjective: No nausea vomiting fevers chills. Creatinine downtrending.  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 Followed by infectious disease and currently on induction therapy with amphotericin and flucytosine x4 wk. following which he needs to start on consolidative therapy with fluconazole 40 mg daily x8 weeks then 200 mg daily for a year.  Medication being dosed as per pharmacy with monitoring of electrolytes magnesium potassium and creatinine and has pre-/post hydration with therapy Continue on empiric Keppra.   CLL history and chemotherapy which is on hold.  Follow-up with Dr. Maylon Peppers as outpatient.   Hypertension:BP controlled and monitor.    Mild AKI creatinine up at 1.5- added iv nss and improving, cont at slow rate. Patient is getting pre and post infusion hydration  Metabolic acidosis with AKI continue IV fluids, oral hydration.  Rash ? Form amphoterics vs CHG- on bandryl  with amphotericin. Add atarax and hydrocortisone. Await ID inputs on it.  Body mass index is 29.76 kg/m.    DVT prophylaxis: SCD/ambulating Code Status: Full code  family Communication: plan of care discussed with patient in detail.  Offered to call his wife again- he declined and states she is coming to hospital today Disposition Plan: Remains inpatient pending completion on anti cryptococcal treatment with  induction therapy   Consultants: Neurosurgery, infectious disease  Procedures: Lumbar puncture  Microbiology: CSF culture no growth to date. COVID-19 -9/28  Antimicrobials: Anti-infectives (From admission, onward)   Start     Dose/Rate Route Frequency Ordered Stop   02/07/19 2230  atovaquone (MEPRON) 750 MG/5ML suspension 750 mg  Status:  Discontinued     750 mg Oral Daily with breakfast 02/07/19 1729 02/10/19 1543   02/07/19 2200  acyclovir (ZOVIRAX) tablet 400 mg     400 mg Oral 2 times daily 02/07/19 1729     02/07/19 1800  flucytosine (ANCOBON) capsule 2,500 mg     25 mg/kg  95.7 kg Oral Every 6 hours 02/07/19 1528     02/07/19 1800  amphotericin B liposome (AMBISOME) 250 mg in dextrose 5 % 500 mL IVPB     3 mg/kg  82.1 kg (Adjusted) 250 mL/hr over 120 Minutes Intravenous Every 24 hours 02/07/19 1528         Objective: Vitals:   02/26/19 1738 02/26/19 2029 02/27/19 0436 02/27/19 0902  BP: 116/79 117/74 115/73 121/81  Pulse: (!) 101 90 86 89  Resp: 18 16 18 18   Temp: (!) 97.3 F (36.3 C) 98.7 F (37.1 C) 98.8 F (37.1 C) 98.5 F (36.9 C)  TempSrc: Oral Oral Oral Oral  SpO2: 99% 100% 98% 98%  Weight:      Height:        Intake/Output Summary (Last 24 hours) at 02/27/2019 1125 Last data filed at 02/27/2019 0900 Gross per 24 hour  Intake 2839.51 ml  Output -  Net 2839.51 ml   Filed Weights   02/15/19 0514 02/18/19 0659 02/20/19 0441  Weight: 92.4 kg 92.4 kg 94.1 kg   Weight change:   Body mass index is 29.76 kg/m.  Intake/Output from  previous day: 10/17 0701 - 10/18 0700 In: 2839.5 [P.O.:900; I.V.:1439.5; IV Piggyback:500] Out: -  Intake/Output this shift: Total I/O In: 300 [P.O.:300] Out: -   Examination:  General exam: AAO,NAD, weak/frail HEENT:Oral mucosa moist, Ear/Nose WNL grossly, dentition normal. Respiratory system: Diminished at the base,no wheezing or crackles, NT,no use of accessory muscle Cardiovascular system: S1 & S2 +, No JVD, regular RR. Gastrointestinal system: Abdomen soft, NT,ND, BS+ VP shunt in place with glue on wound Nervous System:Alert, awake, moving extremities and grossly nonfocal Extremities: No edema, distal peripheral pulses palpable.  Skin: No rashes,no icterus.  Scalp with staples intact wound healing MSK: Normal muscle bulk,tone, power  Medications:  Scheduled Meds: . acyclovir  400 mg Oral BID  . Chlorhexidine Gluconate Cloth  6 each Topical Daily  . flucytosine  25 mg/kg Oral Q6H  . loratadine  10 mg Oral Daily  . pantoprazole  40 mg Oral Q supper  . potassium chloride  40 mEq Oral BID  . sodium chloride  1,000 mL Intravenous Q24H  . sodium chloride  1,000 mL Intravenous Q24H   Continuous Infusions: . sodium chloride Stopped (02/26/19 1500)  . sodium chloride 75 mL/hr at 02/27/19 0934  . amphotericin  B  Liposome (AMBISOME) ADULT IV 250 mg (02/26/19 1947)    Data Reviewed: I have personally reviewed following labs and imaging studies  CBC: Recent Labs  Lab 02/22/19 0433 02/24/19 0443 02/25/19 0504 02/26/19 0325 02/27/19 0402  WBC 10.9* 10.1 8.9 11.2* 8.4  HGB 13.1 13.6 13.5 13.7 12.1*  HCT 40.5 40.8 42.4 42.4 37.9*  MCV 83.7 82.8 83.5 82.8 83.5  PLT 117* 140* 155 203 [id number]   Basic Metabolic Panel: Recent Labs  Lab 02/23/19 0432 02/24/19 0443 02/25/19 0504 02/26/19 0325 02/27/19 0402  NA 139 136 137 140 141  K 4.6 4.2 4.9 4.8 4.3  CL 107 107 107 109 113*  CO2 21* 20* 20* 21* 21*  GLUCOSE 119* 124* 115* 112* 106*  BUN 5* 6 11 11 7   CREATININE 1.09  1.33* 1.41* 1.52* 1.45*  CALCIUM 8.6* 8.6* 9.0 8.9 8.2*  MG 2.1 1.9 2.0 2.0 2.0   GFR: Estimated Creatinine Clearance: 73.3 mL/min (A) (by C-G formula based on SCr of 1.45 mg/dL (H)). Liver Function Tests: No results for input(s): AST, ALT, ALKPHOS, BILITOT, PROT, ALBUMIN in the last 168 hours. No results for input(s): LIPASE, AMYLASE in the last 168 hours. No results for input(s): AMMONIA in the last 168 hours. Coagulation Profile: No results for input(s): INR, PROTIME in the last 168 hours. Cardiac Enzymes: No results for input(s): CKTOTAL, CKMB, CKMBINDEX, TROPONINI in the last 168 hours. BNP (last 3 results) No results for input(s): PROBNP in the last 8760 hours. HbA1C: No results for input(s): HGBA1C in the last 72 hours. CBG: No results for input(s): GLUCAP in the last 168 hours. Lipid Profile: No results for input(s): CHOL, HDL, LDLCALC, TRIG, CHOLHDL, LDLDIRECT in the last 72 hours. Thyroid Function Tests: No results for input(s): TSH, T4TOTAL, FREET4, T3FREE, THYROIDAB in the last  72 hours. Anemia Panel: No results for input(s): VITAMINB12, FOLATE, FERRITIN, TIBC, IRON, RETICCTPCT in the last 72 hours. Sepsis Labs: No results for input(s): PROCALCITON, LATICACIDVEN in the last 168 hours.  No results found for this or any previous visit (from the past 240 hour(s)).    Radiology Studies: No results found.    LOS: 20 days   Time spent: More than 50% of that time was spent in counseling and/or coordination of care.  Antonieta Pert, MD [facility]  02/27/2019, 11:25 AM

## 2019-02-28 DIAGNOSIS — R21 Rash and other nonspecific skin eruption: Secondary | ICD-10-CM

## 2019-02-28 LAB — BASIC METABOLIC PANEL
Anion gap: 5 (ref 5–15)
BUN: 8 mg/dL (ref 6–20)
CO2: 23 mmol/L (ref 22–32)
Calcium: 8.6 mg/dL — ABNORMAL LOW (ref 8.9–10.3)
Chloride: 115 mmol/L — ABNORMAL HIGH (ref 98–111)
Creatinine, Ser: 1.32 mg/dL — ABNORMAL HIGH (ref 0.61–1.24)
GFR calc Af Amer: >60 mL/min (ref >60)
GFR calc non Af Amer: >60 mL/min (ref >60)
Glucose, Bld: 129 mg/dL — ABNORMAL HIGH (ref 70–99)
Potassium: 4.3 mmol/L (ref 3.5–5.1)
Sodium: 143 mmol/L (ref 135–145)

## 2019-02-28 LAB — CBC
HCT: 36.1 % — ABNORMAL LOW (ref 39.0–52.0)
Hemoglobin: 11.8 g/dL — ABNORMAL LOW (ref 13.0–17.0)
MCH: 26.9 pg (ref 26.0–34.0)
MCHC: 32.7 g/dL (ref 30.0–36.0)
MCV: 82.2 fL (ref 80.0–100.0)
Platelets: 184 10*3/uL (ref 150–400)
RBC: 4.39 MIL/uL (ref 4.22–5.81)
RDW: 13.3 % (ref 11.5–15.5)
WBC: 9.4 10*3/uL (ref 4.0–10.5)
nRBC: 0 % (ref 0.0–0.2)

## 2019-02-28 LAB — MAGNESIUM: Magnesium: 1.9 mg/dL (ref 1.7–2.4)

## 2019-02-28 NOTE — Progress Notes (Signed)
PROGRESS NOTE    David Whitehead  [id number] DOB: Dec 11, 1970 DOA: 02/07/2019 PCP: [name], MD   Brief Narrative: 48 year old male with history of CLL, hypertension, diabetes type 2 who presented with headache, diplopia. He underwent work-up for headache as an outpatient. He was seen by neurology and underwent lumbar puncture under fluoroscopy and was found to have elevated opening pressure of 55. He noted immediate improvement in headache after lumbar puncture. Cryptococcal antigen titers came out to be positive at 1: 64 and patient was sent to the emergency department. ID consulted after admission. Started on antibiotics. His symptoms of headache and diplopia have significantly improved. Hospital course remarkable for persistent elevated opening pressure so he has been undergoing series of lumbar punctures.  Subjective:  Labs better this am creat down to 1.3. No fever no new complaints.  Rash improved after the IV steroids yesterday Assessment & Plan:   Cryptococcal meningitis, .MRI of the brain on 02/16/2019 shows abnormal leptomeningeal enhancement and cerebritis. 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 Followed by infectious disease and currently on induction therapy with amphotericin and flucytosine x4 wk. following which he needs to start on consolidative therapy with fluconazole 40 mg daily x8 weeks then 200 mg daily for a year.  Medication being dosed as per pharmacy with monitoring of electrolytes magnesium potassium and creatinine and has pre-/post hydration with therapy Continue on empiric Keppra.  39 potassium level stable.  CLL history and chemotherapy which is on hold.  Follow-up with Dr. Maylon Peppers as outpatient.   Hypertension:BP controlled and monitor.    Mild AKI creatinine up at 1.5 with IV fluids improved to 1.3.  Continue hydration.  Monitor labs.  Metabolic acidosis with AKI continue IV fluids, oral hydration.  Rash:? From amphoterics  vs CHG- on bandryl with amphotericin.  Rash improved after  ID started IV hydrocortisone.  Continue and taper.    Body mass index is 29.76 kg/m.    DVT prophylaxis: SCD/ambulating Code Status: Full code  family Communication: plan of care discussed with patient in detail.  Offered to call his wife-he declined states he has been in talk with her. Disposition Plan: Remains inpatient pending completion anti cryptococcal treatment with  induction therapy   Consultants: Neurosurgery, infectious disease  Procedures: Lumbar puncture  Microbiology: CSF culture no growth to date. COVID-19 -9/28  Antimicrobials: Anti-infectives (From admission, onward)   Start     Dose/Rate Route Frequency Ordered Stop   02/07/19 2230  atovaquone (MEPRON) 750 MG/5ML suspension 750 mg  Status:  Discontinued     750 mg Oral Daily with breakfast 02/07/19 1729 02/10/19 1543   02/07/19 2200  acyclovir (ZOVIRAX) tablet 400 mg     400 mg Oral 2 times daily 02/07/19 1729     02/07/19 1800  flucytosine (ANCOBON) capsule 2,500 mg     25 mg/kg  95.7 kg Oral Every 6 hours 02/07/19 1528     02/07/19 1800  amphotericin B liposome (AMBISOME) 250 mg in dextrose 5 % 500 mL IVPB     3 mg/kg  82.1 kg (Adjusted) 250 mL/hr over 120 Minutes Intravenous Every 24 hours 02/07/19 1528         Objective: Vitals:   02/27/19 0902 02/27/19 1736 02/27/19 2030 02/28/19 0608  BP: 121/81 127/78 124/79 108/78  Pulse: 89 86 91 80  Resp: 18 18 16 18   Temp: 98.5 F (36.9 C) 98.7 F (37.1 C) 98.6 F (37 C) 97.7 F (36.5 C)  TempSrc: Oral Oral Oral  Oral  SpO2: 98% 99% 98% 98%  Weight:      Height:        Intake/Output Summary (Last 24 hours) at 02/28/2019 0856 Last data filed at 02/28/2019 0400 Gross per 24 hour  Intake 2242.94 ml  Output -  Net 2242.94 ml   Filed Weights   02/15/19 0514 02/18/19 0659 02/20/19 0441  Weight: 92.4 kg 92.4 kg 94.1 kg   Weight change:   Body mass index is 29.76 kg/m.  Intake/Output  from previous day: 10/18 0701 - 10/19 0700 In: 2242.9 [P.O.:840; I.V.:1402.9] Out: -  Intake/Output this shift: No intake/output data recorded.  Examination:  General exam: AAO,NAD, weak/frail HEENT:Oral mucosa moist, Ear/Nose WNL grossly, dentition normal. Respiratory system: Diminished at the base,no wheezing or crackles, NT,no use of accessory muscle Cardiovascular system: S1 & S2 +, No JVD, regular RR. Gastrointestinal system: Abdomen soft, NT,ND, BS+ VP shunt in place with glue on wound Nervous System:Alert, awake, moving extremities and grossly nonfocal Extremities: No edema, distal peripheral pulses palpable.  Skin: No rashes,no icterus.  Scalp with staples intact wound healing MSK: Normal muscle bulk,tone, power  Medications:  Scheduled Meds: . acyclovir  400 mg Oral BID  . Chlorhexidine Gluconate Cloth  6 each Topical Daily  . flucytosine  25 mg/kg Oral Q6H  . hydrocortisone sod succinate (SOLU-CORTEF) inj  50 mg Intravenous Daily  . loratadine  10 mg Oral Daily  . pantoprazole  40 mg Oral Q supper  . potassium chloride  40 mEq Oral BID  . sodium chloride  1,000 mL Intravenous Q24H  . sodium chloride  1,000 mL Intravenous Q24H   Continuous Infusions: . sodium chloride Stopped (02/26/19 1500)  . sodium chloride 75 mL/hr at 02/27/19 2341  . amphotericin  B  Liposome (AMBISOME) ADULT IV 250 mg (02/27/19 1854)  . dextrose Stopped (02/27/19 2215)    Data Reviewed: I have personally reviewed following labs and imaging studies  CBC: Recent Labs  Lab 02/24/19 0443 02/25/19 0504 02/26/19 0325 02/27/19 0402 02/28/19 0324  WBC 10.1 8.9 11.2* 8.4 9.4  HGB 13.6 13.5 13.7 12.1* 11.8*  HCT 40.8 42.4 42.4 37.9* 36.1*  MCV 82.8 83.5 82.8 83.5 82.2  PLT 140* 155 203 183 [id number]   Basic Metabolic Panel: Recent Labs  Lab 02/24/19 0443 02/25/19 0504 02/26/19 0325 02/27/19 0402 02/28/19 0324  NA 136 137 140 141 143  K 4.2 4.9 4.8 4.3 4.3  CL 107 107 109 113* 115*  CO2  20* 20* 21* 21* 23  GLUCOSE 124* 115* 112* 106* 129*  BUN 6 11 11 7 8   CREATININE 1.33* 1.41* 1.52* 1.45* 1.32*  CALCIUM 8.6* 9.0 8.9 8.2* 8.6*  MG 1.9 2.0 2.0 2.0 1.9   GFR: Estimated Creatinine Clearance: 80.5 mL/min (A) (by C-G formula based on SCr of 1.32 mg/dL (H)). Liver Function Tests: No results for input(s): AST, ALT, ALKPHOS, BILITOT, PROT, ALBUMIN in the last 168 hours. No results for input(s): LIPASE, AMYLASE in the last 168 hours. No results for input(s): AMMONIA in the last 168 hours. Coagulation Profile: No results for input(s): INR, PROTIME in the last 168 hours. Cardiac Enzymes: No results for input(s): CKTOTAL, CKMB, CKMBINDEX, TROPONINI in the last 168 hours. BNP (last 3 results) No results for input(s): PROBNP in the last 8760 hours. HbA1C: No results for input(s): HGBA1C in the last 72 hours. CBG: No results for input(s): GLUCAP in the last 168 hours. Lipid Profile: No results for input(s): CHOL, HDL, LDLCALC, TRIG, CHOLHDL, LDLDIRECT  in the last 72 hours. Thyroid Function Tests: No results for input(s): TSH, T4TOTAL, FREET4, T3FREE, THYROIDAB in the last 72 hours. Anemia Panel: No results for input(s): VITAMINB12, FOLATE, FERRITIN, TIBC, IRON, RETICCTPCT in the last 72 hours. Sepsis Labs: No results for input(s): PROCALCITON, LATICACIDVEN in the last 168 hours.  No results found for this or any previous visit (from the past 240 hour(s)).    Radiology Studies: No results found.    LOS: 21 days   Time spent: More than 50% of that time was spent in counseling and/or coordination of care.  Antonieta Pert, MD [facility]  02/28/2019, 8:56 AM

## 2019-02-28 NOTE — Progress Notes (Signed)
[facility] for Infectious Disease  Date of Admission:  02/07/2019     Total days of antibiotics 22         ASSESSMENT:  Mr. Bueti continues to have a mild rash that is symptomatically improved with Benedryl and prednisone. Appears related to CHG baths. Pharmacy is maintaining electrolyte balance while on Ampho-B. Will continue current dose of Ampho B and 5FU. Goal is 28 days of induction therapy for cryptococcal meningitis.   PLAN:  1. Continue current dose of Ampho-B and 5FU. 2. Electrolyte management per pharmacy protocol.   Principal Problem:   Cryptococcal meningitis ([place]) Active Problems:   CLL (chronic lymphocytic leukemia) ([facility])   Pulmonary nodule   Hypertension   Brain lesion   Meningitis, cryptococcal ([place])   . acyclovir  400 mg Oral BID  . Chlorhexidine Gluconate Cloth  6 each Topical Daily  . flucytosine  25 mg/kg Oral Q6H  . hydrocortisone sod succinate (SOLU-CORTEF) inj  50 mg Intravenous Daily  . loratadine  10 mg Oral Daily  . pantoprazole  40 mg Oral Q supper  . potassium chloride  40 mEq Oral BID  . sodium chloride  1,000 mL Intravenous Q24H  . sodium chloride  1,000 mL Intravenous Q24H    SUBJECTIVE:  Feeling good today. Rash appears to be improving with symptoms that are tolerable with addition of Benadryl and prednisone. No acute events overnight. Ready to go home.    Allergies  Allergen Reactions  . Bactrim [Sulfamethoxazole-Trimethoprim] Rash  . Latex Other (See Comments)    Took skin off when removed tape  . Amoxicillin Rash    Has patient had a PCN reaction causing immediate rash, facial/tongue/throat swelling, SOB or lightheadedness with hypotension: No Has patient had a PCN reaction causing severe rash involving mucus membranes or skin necrosis: No Has patient had a PCN reaction that required hospitalization: No Has patient had a PCN reaction occurring within the last 10 years: No If all of the above answers are "NO", then may  proceed with Cephalosporin use.      Review of Systems: Review of Systems  Constitutional: Negative for chills, fever and weight loss.  Respiratory: Negative for cough, shortness of breath and wheezing.   Cardiovascular: Negative for chest pain and leg swelling.  Gastrointestinal: Negative for abdominal pain, constipation, diarrhea, nausea and vomiting.  Skin: Positive for rash.      OBJECTIVE: Vitals:   02/27/19 1736 02/27/19 2030 02/28/19 0608 02/28/19 0901  BP: 127/78 124/79 108/78 129/82  Pulse: 86 91 80 85  Resp: 18 16 18 20   Temp: 98.7 F (37.1 C) 98.6 F (37 C) 97.7 F (36.5 C) 98.2 F (36.8 C)  TempSrc: Oral Oral Oral Oral  SpO2: 99% 98% 98% 99%  Weight:      Height:       Body mass index is 29.76 kg/m.  Physical Exam Constitutional:      General: He is not in acute distress.    Appearance: He is well-developed.  Cardiovascular:     Rate and Rhythm: Normal rate and regular rhythm.     Heart sounds: Normal heart sounds.  Pulmonary:     Effort: Pulmonary effort is normal.     Breath sounds: Normal breath sounds.  Skin:    General: Skin is warm and dry.  Neurological:     Mental Status: He is alert and oriented to person, place, and time.  Psychiatric:        Mood and Affect:  Mood normal.     Lab Results Lab Results  Component Value Date   WBC 9.4 02/28/2019   HGB 11.8 (L) 02/28/2019   HCT 36.1 (L) 02/28/2019   MCV 82.2 02/28/2019   PLT 184 02/28/2019    Lab Results  Component Value Date   CREATININE 1.32 (H) 02/28/2019   BUN 8 02/28/2019   NA 143 02/28/2019   K 4.3 02/28/2019   CL 115 (H) 02/28/2019   CO2 23 02/28/2019    Lab Results  Component Value Date   ALT 19 02/19/2019   AST 12 (L) 02/19/2019   ALKPHOS 41 02/19/2019   BILITOT 0.7 02/19/2019     Microbiology: No results found for this or any previous visit (from the past 240 hour(s)).   Terri Piedra, NP [facility]  [phone] Pager  02/28/2019  4:20 PM

## 2019-02-28 NOTE — Plan of Care (Signed)
  Problem: Health Behavior/Discharge Planning: Goal: Ability to manage health-related needs will improve Outcome: Progressing   Problem: Clinical Measurements: Goal: Diagnostic test results will improve Outcome: Progressing   Problem: Clinical Measurements: Goal: Diagnostic test results will improve Outcome: Progressing   Problem: Safety: Goal: Ability to remain free from injury will improve Outcome: Progressing

## 2019-02-28 NOTE — Progress Notes (Addendum)
Pharmacy Antibiotic Note  David Whitehead is a 48 y.o. 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 Pharmacy has been consulted for amphotericin B liposome dosing. Ambisome 3 mg/kg and flucytosine 25 mg/kg started in this patient with suspected cryptococcal meningitis. Normal saline ordered to be given an hour before and an hour after the Ambisome infusion with D5 flushes administered inbetween. The pharmacy team will monitor the patients renal function, potassium and magnesium and make adjustments per protocol.  Potassium today is 4.3. Mg is stable at 1.9.   SCr is down to 1.32 today from 1.45. Will continue 1 L of fluid pre and post dose of amphotericin B. Platelets are stable at 184. A flucytosine level has been sent to [facility] for monitoring.   Plan: Continue Ambisome 250 mg q24h (3 mg/kg) Continue Flucytosine 2,500 mg q6h (25 mg/kg) Continue potassium 40 mEq BID Continue pre-dose fluid at 1000 mL and  post dose fluid at 1000 mL Monitor Scr and CBC   Height: 5\' 10"  (177.8 cm) Weight: 207 lb 6.4 oz (94.1 kg) IBW/kg (Calculated) : 73  Temp (24hrs), Avg:98.4 F (36.9 C), Min:97.7 F (36.5 C), Max:98.7 F (37.1 C)  Recent Labs  Lab 02/24/19 0443 02/25/19 0504 02/26/19 0325 02/27/19 0402 02/28/19 0324  WBC 10.1 8.9 11.2* 8.4 9.4  CREATININE 1.33* 1.41* 1.52* 1.45* 1.32*    Estimated Creatinine Clearance: 80.5 mL/min (A) (by C-G formula based on SCr of 1.32 mg/dL (H)).    Allergies  Allergen Reactions  . Bactrim [Sulfamethoxazole-Trimethoprim] Rash  . Latex Other (See Comments)    Took skin off when removed tape  . Amoxicillin Rash    Has patient had a PCN reaction causing immediate rash, facial/tongue/throat swelling, SOB or lightheadedness with hypotension: No Has patient had a PCN reaction causing severe rash involving mucus membranes or skin necrosis: No Has patient had a PCN reaction that required hospitalization: No Has patient had a PCN reaction  occurring within the last 10 years: No If all of the above answers are "NO", then may proceed with Cephalosporin use.     Antimicrobials this admission: Ambisome  9/28>> Flucytosine 9/28>>   Thank you for allowing pharmacy to be a part of this patient's care.  Jimmy Footman, PharmD, BCPS, BCIDP Infectious Diseases Clinical Pharmacist Phone: [phone]  02/28/2019 8:12 AM

## 2019-03-01 ENCOUNTER — Other Ambulatory Visit: Payer: BC Managed Care – PPO

## 2019-03-01 ENCOUNTER — Ambulatory Visit: Payer: BC Managed Care – PPO | Admitting: Hematology

## 2019-03-01 LAB — CBC
HCT: 37 % — ABNORMAL LOW (ref 39.0–52.0)
Hemoglobin: 11.9 g/dL — ABNORMAL LOW (ref 13.0–17.0)
MCH: 26.6 pg (ref 26.0–34.0)
MCHC: 32.2 g/dL (ref 30.0–36.0)
MCV: 82.8 fL (ref 80.0–100.0)
Platelets: 208 10*3/uL (ref 150–400)
RBC: 4.47 MIL/uL (ref 4.22–5.81)
RDW: 13.6 % (ref 11.5–15.5)
WBC: 9.1 10*3/uL (ref 4.0–10.5)
nRBC: 0 % (ref 0.0–0.2)

## 2019-03-01 LAB — BASIC METABOLIC PANEL
Anion gap: 9 (ref 5–15)
BUN: 9 mg/dL (ref 6–20)
CO2: 22 mmol/L (ref 22–32)
Calcium: 8.9 mg/dL (ref 8.9–10.3)
Chloride: 110 mmol/L (ref 98–111)
Creatinine, Ser: 1.35 mg/dL — ABNORMAL HIGH (ref 0.61–1.24)
GFR calc Af Amer: >60 mL/min (ref >60)
GFR calc non Af Amer: >60 mL/min (ref >60)
Glucose, Bld: 90 mg/dL (ref 70–99)
Potassium: 3.8 mmol/L (ref 3.5–5.1)
Sodium: 141 mmol/L (ref 135–145)

## 2019-03-01 LAB — MAGNESIUM: Magnesium: 1.8 mg/dL (ref 1.7–2.4)

## 2019-03-01 MED ORDER — SODIUM CHLORIDE 0.9 % IV SOLN
INTRAVENOUS | Status: AC
  Administered 2019-03-01: 11:00:00 via INTRAVENOUS

## 2019-03-01 MED ORDER — POTASSIUM CHLORIDE CRYS ER 20 MEQ PO TBCR
20.0000 meq | EXTENDED_RELEASE_TABLET | Freq: Once | ORAL | Status: AC
  Administered 2019-03-01: 20 meq via ORAL
  Filled 2019-03-01: qty 1

## 2019-03-01 NOTE — Plan of Care (Signed)
  Problem: Clinical Measurements: Goal: Will remain free from infection Outcome: Progressing   Problem: Clinical Measurements: Goal: Will remain free from infection Outcome: Progressing   Problem: Coping: Goal: Level of anxiety will decrease Outcome: Progressing

## 2019-03-01 NOTE — Progress Notes (Signed)
PROGRESS NOTE    David Whitehead  [id number] DOB: Oct 27, 1970 DOA: 02/07/2019 PCP: [name], MD   Brief Narrative: 48 year old male with history of CLL, hypertension, diabetes type 2 who presented with headache, diplopia. He underwent work-up for headache as an outpatient. He was seen by neurology and underwent lumbar puncture under fluoroscopy and was found to have elevated opening pressure of 55. He noted immediate improvement in headache after lumbar puncture. Cryptococcal antigen titers came out to be positive at 1: 64 and patient was sent to the emergency department. ID consulted after admission. Started on antibiotics. His symptoms of headache and diplopia have significantly improved. Hospital course remarkable for persistent elevated opening pressure so he has been undergoing series of lumbar punctures.  Subjective: Feels well Some Itching last night T-max 98.4. No other complaints.  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 Followed by infectious disease and currently on induction therapy with amphotericin and flucytosine x4 wk. following which he needs to start on consolidative therapy with fluconazole 40 mg daily x8 weeks then 200 mg daily for a year.  Medication being dosed as per pharmacy with monitoring of electrolytes magnesium potassium and creatinine and has pre-/post hydration with therapy Continue on empiric Keppra.    CLL history and chemotherapy which is on hold.  Follow-up with Dr. Maylon Peppers as outpatient.   Hypertension:BP controlled and monitor.    Mild AKI creatinine up at 1.5 with IV fluids improved to 1.3-stable continue IV NS at 50 mils per hour  Metabolic acidosis with AKI: Bicarb improved to 22.  Continue IV fluids gently.  Rash:? From amphoterics vs CHG- on bandryl with amphotericin.  Rash  improved after  ID started IV hydrocortisone.  Continue and taper.  Continue Benadryl for itching  Body mass index is 29.76 kg/m.    DVT prophylaxis: SCD/ambulating Code Status: Full code  family Communication: plan of care discussed with patient in detail.  Offered to call his wife-he declined states he has been in talk with her, and she is fine. Disposition Plan: Remains inpatient pending completion anti cryptococcal treatment with  induction therapy  Consultants: Neurosurgery, infectious disease  Procedures: Lumbar puncture  Microbiology: CSF culture no growth to date. COVID-19 -9/28  Antimicrobials: Anti-infectives (From admission, onward)   Start     Dose/Rate Route Frequency Ordered Stop   02/07/19 2230  atovaquone (MEPRON) 750 MG/5ML suspension 750 mg  Status:  Discontinued     750 mg Oral Daily with breakfast 02/07/19 1729 02/10/19 1543   02/07/19 2200  acyclovir (ZOVIRAX) tablet 400 mg     400 mg Oral 2 times daily 02/07/19 1729     02/07/19 1800  flucytosine (ANCOBON) capsule 2,500 mg     25 mg/kg  95.7 kg Oral Every 6 hours 02/07/19 1528     02/07/19 1800  amphotericin B liposome (AMBISOME) 250 mg in dextrose 5 % 500 mL IVPB     3 mg/kg  82.1 kg (Adjusted) 250 mL/hr over 120 Minutes Intravenous Every 24 hours 02/07/19 1528         Objective: Vitals:   02/28/19 0901 02/28/19 1638 02/28/19 2132 03/01/19 0501  BP: 129/82 120/90 114/77 122/80  Pulse: 85 81 87 74  Resp: 20 18 18 18   Temp: 98.2 F (36.8 C) 98.4 F (36.9 C) 98.2 F (36.8 C) 98.4 F (36.9 C)  TempSrc: Oral Oral Oral Oral  SpO2: 99% 99% 98% 100%  Weight:      Height:        Intake/Output Summary (Last 24 hours) at 03/01/2019 0854 Last data filed at 03/01/2019 0700 Gross per 24 hour  Intake 4395.69 ml  Output 0 ml  Net 4395.69 ml   Filed Weights   02/15/19 0514 02/18/19 0659 02/20/19 0441  Weight: 92.4 kg 92.4 kg 94.1 kg   Weight change:   Body mass index is 29.76 kg/m.   Intake/Output from previous day: 10/19 0701 - 10/20 0700 In: 4635.7 [P.O.:480; I.V.:2578; IV Piggyback:1577.7] Out: 0  Intake/Output this shift: No intake/output data recorded.  Examination:  General exam: AAO,NAD, on room air not in acute distress il HEENT:Oral mucosa moist, Ear/Nose WNL grossly, dentition normal. Respiratory system: Diminished at the base,no wheezing or crackles, NT,no use of accessory muscle Cardiovascular system: S1 & S2 +, No JVD, regular RR. Gastrointestinal system: Abdomen soft, NT,ND, BS+ VP shunt in place with glue on wound Nervous System:Alert, awake, moving extremities and grossly nonfocal Extremities: No edema, distal peripheral pulses palpable.  Skin: No rashes,no icterus.  VP shunt area, scalp staples intact wound healing, abdominal incision w glue and healing MSK: Normal muscle bulk,tone, power  Medications:  Scheduled Meds: . acyclovir  400 mg Oral BID  . Chlorhexidine Gluconate Cloth  6 each Topical Daily  . flucytosine  25 mg/kg Oral Q6H  . hydrocortisone sod succinate (SOLU-CORTEF) inj  50 mg Intravenous Daily  . loratadine  10 mg Oral Daily  . pantoprazole  40 mg Oral Q supper  . potassium chloride  40 mEq Oral BID  . sodium chloride  1,000 mL Intravenous Q24H  . sodium chloride  1,000 mL Intravenous Q24H   Continuous Infusions: . sodium chloride Stopped (02/26/19 1500)  . sodium chloride    . amphotericin  B  Liposome (AMBISOME) ADULT IV 250 mg (02/28/19 1816)  . dextrose Stopped (02/28/19 1811)    Data Reviewed: I have personally reviewed following labs and imaging studies  CBC: Recent Labs  Lab 02/25/19 0504 02/26/19 0325 02/27/19 0402 02/28/19 0324 03/01/19 0500  WBC 8.9 11.2* 8.4 9.4 9.1  HGB 13.5 13.7 12.1* 11.8* 11.9*  HCT 42.4 42.4 37.9* 36.1* 37.0*  MCV 83.5 82.8 83.5 82.2 82.8  PLT 155 203 183 184 [id number]   Basic Metabolic Panel: Recent Labs  Lab 02/25/19 0504 02/26/19 0325 02/27/19 0402 02/28/19 0324 03/01/19  0500  NA 137 140 141 143 141  K 4.9 4.8 4.3 4.3 3.8  CL 107 109 113* 115* 110  CO2 20* 21* 21* 23 22  GLUCOSE 115* 112* 106* 129* 90  BUN 11 11 7 8 9   CREATININE 1.41* 1.52* 1.45* 1.32* 1.35*  CALCIUM 9.0 8.9 8.2* 8.6* 8.9  MG 2.0 2.0 2.0 1.9 1.8   GFR: Estimated Creatinine Clearance: 78.7 mL/min (A) (by C-G formula based on SCr of 1.35 mg/dL (H)). Liver Function Tests: No results for input(s): AST, ALT, ALKPHOS, BILITOT, PROT, ALBUMIN in the last 168 hours. No results for input(s): LIPASE, AMYLASE in the last 168 hours. No results for input(s): AMMONIA in the last 168 hours. Coagulation Profile: No results for input(s): INR, PROTIME in the last 168 hours. Cardiac Enzymes: No results for input(s): CKTOTAL, CKMB, CKMBINDEX, TROPONINI in the last 168 hours. BNP (last 3 results) No results for input(s): PROBNP in the last 8760 hours. HbA1C: No results for input(s): HGBA1C in the last 72 hours. CBG: No results for input(s): GLUCAP in the last 168  hours. Lipid Profile: No results for input(s): CHOL, HDL, LDLCALC, TRIG, CHOLHDL, LDLDIRECT in the last 72 hours. Thyroid Function Tests: No results for input(s): TSH, T4TOTAL, FREET4, T3FREE, THYROIDAB in the last 72 hours. Anemia Panel: No results for input(s): VITAMINB12, FOLATE, FERRITIN, TIBC, IRON, RETICCTPCT in the last 72 hours. Sepsis Labs: No results for input(s): PROCALCITON, LATICACIDVEN in the last 168 hours.  No results found for this or any previous visit (from the past 240 hour(s)).    Radiology Studies: No results found.    LOS: 22 days   Time spent: More than 50% of that time was spent in counseling and/or coordination of care.  Antonieta Pert, MD [facility]  03/01/2019, 8:53 AM

## 2019-03-01 NOTE — Progress Notes (Signed)
Pharmacy Antibiotic Note  David Whitehead is a 48 y.o. 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 Pharmacy has been consulted for amphotericin B liposome dosing. Ambisome 3 mg/kg and flucytosine 25 mg/kg started in this patient with suspected cryptococcal meningitis. Normal saline ordered to be given an hour before and an hour after the Ambisome infusion with D5 flushes administered inbetween. The pharmacy team will monitor the patients renal function, potassium and magnesium and make adjustments per protocol.  Potassium today is 3.8 after 80 mEq of replacement yesterday. Mg is down slightly to 1.8.  Will give an extra 20 mEq of potassium today.  SCr is stable at 1.35 today. Will continue 1 L of fluid pre and post dose of amphotericin B. Platelets are stable at 208. A flucytosine level has been sent to [facility] for monitoring.   Plan: Continue Ambisome 250 mg q24h (3 mg/kg) Continue Flucytosine 2,500 mg q6h (25 mg/kg) Continue potassium 40 mEq BID Give one extra dose of potassium 20 mEq today Continue pre-dose fluid at 1000 mL and  post dose fluid at 1000 mL Monitor Scr and CBC   Height: 5\' 10"  (177.8 cm) Weight: 207 lb 6.4 oz (94.1 kg) IBW/kg (Calculated) : 73  Temp (24hrs), Avg:98.3 F (36.8 C), Min:98.2 F (36.8 C), Max:98.4 F (36.9 C)  Recent Labs  Lab 02/25/19 0504 02/26/19 0325 02/27/19 0402 02/28/19 0324 03/01/19 0500  WBC 8.9 11.2* 8.4 9.4 9.1  CREATININE 1.41* 1.52* 1.45* 1.32* 1.35*    Estimated Creatinine Clearance: 78.7 mL/min (A) (by C-G formula based on SCr of 1.35 mg/dL (H)).    Allergies  Allergen Reactions  . Bactrim [Sulfamethoxazole-Trimethoprim] Rash  . Latex Other (See Comments)    Took skin off when removed tape  . Amoxicillin Rash    Has patient had a PCN reaction causing immediate rash, facial/tongue/throat swelling, SOB or lightheadedness with hypotension: No Has patient had a PCN reaction causing severe rash involving mucus  membranes or skin necrosis: No Has patient had a PCN reaction that required hospitalization: No Has patient had a PCN reaction occurring within the last 10 years: No If all of the above answers are "NO", then may proceed with Cephalosporin use.     Antimicrobials this admission: Ambisome  9/28>> Flucytosine 9/28>>   Thank you for allowing pharmacy to be a part of this patient's care.  Jimmy Footman, PharmD, BCPS, BCIDP Infectious Diseases Clinical Pharmacist Phone: [phone]  03/01/2019 8:24 AM

## 2019-03-02 LAB — CBC
HCT: 36.7 % — ABNORMAL LOW (ref 39.0–52.0)
Hemoglobin: 12.1 g/dL — ABNORMAL LOW (ref 13.0–17.0)
MCH: 26.9 pg (ref 26.0–34.0)
MCHC: 33 g/dL (ref 30.0–36.0)
MCV: 81.7 fL (ref 80.0–100.0)
Platelets: 233 10*3/uL (ref 150–400)
RBC: 4.49 MIL/uL (ref 4.22–5.81)
RDW: 13.5 % (ref 11.5–15.5)
WBC: 10 10*3/uL (ref 4.0–10.5)
nRBC: 0 % (ref 0.0–0.2)

## 2019-03-02 LAB — BASIC METABOLIC PANEL
Anion gap: 9 (ref 5–15)
BUN: 9 mg/dL (ref 6–20)
CO2: 22 mmol/L (ref 22–32)
Calcium: 9 mg/dL (ref 8.9–10.3)
Chloride: 112 mmol/L — ABNORMAL HIGH (ref 98–111)
Creatinine, Ser: 1.25 mg/dL — ABNORMAL HIGH (ref 0.61–1.24)
GFR calc Af Amer: >60 mL/min (ref >60)
GFR calc non Af Amer: >60 mL/min (ref >60)
Glucose, Bld: 105 mg/dL — ABNORMAL HIGH (ref 70–99)
Potassium: 3.8 mmol/L (ref 3.5–5.1)
Sodium: 143 mmol/L (ref 135–145)

## 2019-03-02 LAB — MAGNESIUM: Magnesium: 1.9 mg/dL (ref 1.7–2.4)

## 2019-03-02 MED ORDER — POTASSIUM CHLORIDE CRYS ER 20 MEQ PO TBCR
20.0000 meq | EXTENDED_RELEASE_TABLET | Freq: Once | ORAL | Status: AC
  Administered 2019-03-02: 20 meq via ORAL
  Filled 2019-03-02: qty 1

## 2019-03-02 NOTE — Progress Notes (Signed)
Pharmacy Antibiotic Note  David Whitehead is a 48 y.o. 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 Pharmacy has been consulted for amphotericin B liposome dosing. Ambisome 3 mg/kg and flucytosine 25 mg/kg started in this patient with suspected cryptococcal meningitis. Normal saline ordered to be given an hour before and an hour after the Ambisome infusion with D5 flushes administered inbetween. The pharmacy team will monitor the patients renal function, potassium and magnesium and make adjustments per protocol.  Potassium today is 3.8 after 100 mEq of replacement yesterday. Mg is up slightly to 1.9.  Will give an extra 20 mEq of potassium again today in addition to his 40 BID.   SCr is stable at 1.25 today. Will continue 1 L of fluid pre and post dose of amphotericin B. Platelets are stable at 208. A flucytosine level has been sent to [facility] for monitoring.   Plan: Continue Ambisome 250 mg q24h (3 mg/kg) Continue Flucytosine 2,500 mg q6h (25 mg/kg) Continue potassium 40 mEq BID Give one extra dose of potassium 20 mEq today Continue pre-dose fluid at 1000 mL and  post dose fluid at 1000 mL Monitor Scr and CBC   Height: 5\' 10"  (177.8 cm) Weight: 207 lb 6.4 oz (94.1 kg) IBW/kg (Calculated) : 73  Temp (24hrs), Avg:98.6 F (37 C), Min:98.2 F (36.8 C), Max:99.2 F (37.3 C)  Recent Labs  Lab 02/26/19 0325 02/27/19 0402 02/28/19 0324 03/01/19 0500 03/02/19 0347  WBC 11.2* 8.4 9.4 9.1 10.0  CREATININE 1.52* 1.45* 1.32* 1.35* 1.25*    Estimated Creatinine Clearance: 85 mL/min (A) (by C-G formula based on SCr of 1.25 mg/dL (H)).    Allergies  Allergen Reactions  . Bactrim [Sulfamethoxazole-Trimethoprim] Rash  . Latex Other (See Comments)    Took skin off when removed tape  . Amoxicillin Rash    Has patient had a PCN reaction causing immediate rash, facial/tongue/throat swelling, SOB or lightheadedness with hypotension: No Has patient had a PCN reaction causing  severe rash involving mucus membranes or skin necrosis: No Has patient had a PCN reaction that required hospitalization: No Has patient had a PCN reaction occurring within the last 10 years: No If all of the above answers are "NO", then may proceed with Cephalosporin use.     Antimicrobials this admission: Ambisome  9/28>> Flucytosine 9/28>>   Thank you for allowing pharmacy to be a part of this patient's care.  Nicoletta Dress, PharmD PGY2 Infectious Disease Pharmacy Resident  Phone: [phone]  03/02/2019 10:54 AM

## 2019-03-02 NOTE — Progress Notes (Signed)
PROGRESS NOTE    David Whitehead  [id number] DOB: Jan 18, 1971 DOA: 02/07/2019 PCP: [name], MD   Brief Narrative: 48 year old male with history of CLL, hypertension, diabetes type 2 who presented with headache, diplopia. He underwent work-up for headache as an outpatient. He was seen by neurology and underwent lumbar puncture under fluoroscopy and was found to have elevated opening pressure of 55. He noted immediate improvement in headache after lumbar puncture. Cryptococcal antigen titers came out to be positive at 1: 64 and patient was sent to the emergency department. ID consulted after admission. Started on antibiotics. His symptoms of headache and diplopia have significantly improved. Hospital course remarkable for persistent elevated opening pressure so he has been undergoing series of lumbar punctures.  Subjective: Complains of itching again-has not received Atarax Been getting Benadryl prior to the infusion No new complaints, no headache or neck stiffness  Assessment & Plan:   Cryptococcal meningitis, .MRI of the brain on 02/16/2019 shows abnormal leptomeningeal enhancement and cerebritis. 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 Followed by infectious disease and currently on induction therapy with amphotericin and flucytosine x4 wk. following which he needs to start on consolidative therapy with fluconazole 40 mg daily x8 weeks then 200 mg daily for a year.  Medication being dosed as per pharmacy with monitoring of electrolytes magnesium potassium and creatinine and has pre-/post hydration with therapy Continue on empiric Keppra, cont acyclovir.    CLL history and chemotherapy which is on hold.  Follow-up with Dr. Maylon Peppers as outpatient.   Hypertension:BP controlled and monitor.    Mild AKI creatinine peaked at 1.5 with IV fluids improved to 1.2-continue IV NS at 50 mls per hour along with hydration around infusion  Recent Labs  Lab 02/26/19 0325  02/27/19 0402 02/28/19 0324 03/01/19 0500 03/02/19 0347  BUN 11 7 8 9 9   CREATININE 1.52* 1.45* 1.32* 1.35* [id number]*    Metabolic acidosis with AKI: Bicarb improved. Continue IV fluids  Rash:? From amphoterics vs CHG- on bandryl with amphotericin.  Rash improved after  ID started IV hydrocortisone.  Continue and taper.  Continue atarax prn  for itching  Body mass index is 29.76 kg/m.    DVT prophylaxis: SCD/ambulating Code Status: Full code  family Communication: plan of care discussed with patient in detail.  I have offered to call his wife multiple times-he declined states he has been in talk with her, and she is fine. Disposition Plan: Remains inpatient pending completion anti cryptococcal treatment with  induction therapy  Consultants: Neurosurgery, infectious disease  Procedures: Lumbar puncture  Microbiology: CSF culture no growth to date. COVID-19 -9/28  Antimicrobials: Anti-infectives (From admission, onward)   Start     Dose/Rate Route Frequency Ordered Stop   02/07/19 2230  atovaquone (MEPRON) 750 MG/5ML suspension 750 mg  Status:  Discontinued     750 mg Oral Daily with breakfast 02/07/19 1729 02/10/19 1543   02/07/19 2200  acyclovir (ZOVIRAX) tablet 400 mg     400 mg Oral 2 times daily 02/07/19 1729     02/07/19 1800  flucytosine (ANCOBON) capsule 2,500 mg     25 mg/kg  95.7 kg Oral Every 6 hours 02/07/19 1528     02/07/19 1800  amphotericin B liposome (AMBISOME) 250 mg in dextrose 5 % 500 mL IVPB     3 mg/kg  82.1 kg (Adjusted) 250 mL/hr over 120 Minutes Intravenous Every 24 hours 02/07/19 1528         Objective: Vitals:  03/01/19 0501 03/01/19 2055 03/02/19 0431 03/02/19 0854  BP: 122/80 134/88 116/84 123/85  Pulse: 74 96 86 90  Resp: 18 18 18 18   Temp: 98.4 F (36.9 C) 99.2 F (37.3 C) 98.3 F (36.8 C) 98.2 F (36.8 C)  TempSrc: Oral Oral Oral Oral  SpO2: 100% 99% 97% 99%  Weight:      Height:        Intake/Output Summary (Last 24 hours)  at 03/02/2019 0921 Last data filed at 03/02/2019 0601 Gross per 24 hour  Intake 3300.38 ml  Output 0 ml  Net 3300.38 ml   Filed Weights   02/15/19 0514 02/18/19 0659 02/20/19 0441  Weight: 92.4 kg 92.4 kg 94.1 kg   Weight change:   Body mass index is 29.76 kg/m.  Intake/Output from previous day: 10/20 0701 - 10/21 0700 In: 3300.4 [I.V.:702.6; IV Piggyback:2597.8] Out: 0  Intake/Output this shift: No intake/output data recorded.  Examination:  General exam: AAO,NAD, on room air not in acute distress il HEENT:Oral mucosa moist, Ear/Nose WNL grossly, dentition normal. Respiratory system: b/l clear no crackles or wheezing  Cardiovascular system: S1 & S2 +, No JVD, regular RR. Gastrointestinal system: Abdomen soft, NT,ND, BS+ VP shunt in place with glue on wound Nervous System:Alert, awake, moving extremities and grossly nonfocal Extremities: No edema, distal peripheral pulses palpable.  Skin: No rashes,no icterus.  VP shunt area in scalp w intact staples- healing abdominal incision w glue and healed. MSK: Normal muscle bulk,tone, power  Medications:  Scheduled Meds:  acyclovir  400 mg Oral BID   Chlorhexidine Gluconate Cloth  6 each Topical Daily   flucytosine  25 mg/kg Oral Q6H   hydrocortisone sod succinate (SOLU-CORTEF) inj  50 mg Intravenous Daily   loratadine  10 mg Oral Daily   pantoprazole  40 mg Oral Q supper   potassium chloride  40 mEq Oral BID   sodium chloride  1,000 mL Intravenous Q24H   sodium chloride  1,000 mL Intravenous Q24H   Continuous Infusions:  sodium chloride Stopped (02/26/19 1500)   amphotericin  B  Liposome (AMBISOME) ADULT IV 250 mg (03/01/19 1812)   dextrose Stopped (02/28/19 1811)    Data Reviewed: I have personally reviewed following labs and imaging studies  CBC: Recent Labs  Lab 02/26/19 0325 02/27/19 0402 02/28/19 0324 03/01/19 0500 03/02/19 0347  WBC 11.2* 8.4 9.4 9.1 10.0  HGB 13.7 12.1* 11.8* 11.9* 12.1*    HCT 42.4 37.9* 36.1* 37.0* 36.7*  MCV 82.8 83.5 82.2 82.8 81.7  PLT 203 183 184 208 0000000   Basic Metabolic Panel: Recent Labs  Lab 02/26/19 0325 02/27/19 0402 02/28/19 0324 03/01/19 0500 03/02/19 0347  NA 140 141 143 141 143  K 4.8 4.3 4.3 3.8 3.8  CL 109 113* 115* 110 112*  CO2 21* 21* 23 22 22   GLUCOSE 112* 106* 129* 90 105*  BUN 11 7 8 9 9   CREATININE 1.52* 1.45* 1.32* 1.35* 1.25*  CALCIUM 8.9 8.2* 8.6* 8.9 9.0  MG 2.0 2.0 1.9 1.8 1.9   GFR: Estimated Creatinine Clearance: 85 mL/min (A) (by C-G formula based on SCr of 1.25 mg/dL (H)). Liver Function Tests: No results for input(s): AST, ALT, ALKPHOS, BILITOT, PROT, ALBUMIN in the last 168 hours. No results for input(s): LIPASE, AMYLASE in the last 168 hours. No results for input(s): AMMONIA in the last 168 hours. Coagulation Profile: No results for input(s): INR, PROTIME in the last 168 hours. Cardiac Enzymes: No results for input(s): CKTOTAL, CKMB, CKMBINDEX,  TROPONINI in the last 168 hours. BNP (last 3 results) No results for input(s): PROBNP in the last 8760 hours. HbA1C: No results for input(s): HGBA1C in the last 72 hours. CBG: No results for input(s): GLUCAP in the last 168 hours. Lipid Profile: No results for input(s): CHOL, HDL, LDLCALC, TRIG, CHOLHDL, LDLDIRECT in the last 72 hours. Thyroid Function Tests: No results for input(s): TSH, T4TOTAL, FREET4, T3FREE, THYROIDAB in the last 72 hours. Anemia Panel: No results for input(s): VITAMINB12, FOLATE, FERRITIN, TIBC, IRON, RETICCTPCT in the last 72 hours. Sepsis Labs: No results for input(s): PROCALCITON, LATICACIDVEN in the last 168 hours.  No results found for this or any previous visit (from the past 240 hour(s)).    Radiology Studies: No results found.    LOS: 23 days   Time spent: More than 50% of that time was spent in counseling and/or coordination of care.  Antonieta Pert, MD [facility]  03/02/2019, 9:21 AM

## 2019-03-02 NOTE — Progress Notes (Signed)
[place] OFFICE PROGRESS NOTE  Patient Care Team: [name], MD as PCP - General (Family Medicine) Cordelia Poche, RN as Oncology Nurse Navigator [name], MD as Medical Oncologist (Hematology) Danie Binder, MD as Consulting Physician (Gastroenterology)  HEME/ONC OVERVIEW: 1. CLL with bulky lymphadenopathy; RAI Stage IV, intermediate risk; p53 deletion- -03/2018:   PB flow showed CD5+, CD23+ monoclonal B-cells, c/w CLL; IgHV mutation negative  FISH (PB) positive for UXL(24M01); negative t(11;14), p53 deletion/amplification, and ATM (11q22.3) deletion  CT neck, CAP showed bulky lymphadenopathy in bilateral cervical LN's and throughout the chest, abdomen and pelvis; marked splenomegaly -04/2018: R axillary excision LN biopsy showed CLL/SLL (cyclin D1 neg) -05/2017 - present: ibrutinib   11/2018: CT CAP showed near normalization of all LN's except R hilar LN ~3.8cm (improving)  TREATMENT REGIMEN:  05/14/2018 - 02/07/2019: ibrutinib 459m daily  ASSESSMENT & PLAN:  CLL with diffuse lymphadenopathy; RAI Stage IV, intermediate risk   -The patient was on ibrutinib 420 mg daily.  The patient has been tolerating it very well, but developed cryptococcal meningitis. -There are rare case reports of cryptococcal meningitis related to ibrutinib therapy.  It has now been placed on hold. -Continue to hold ibrutinib. -We will plan to see the patient back after his discharge to discuss other therapies for his CLL.  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 -Infectious disease is following closely and treating with amphotericin, flucytosine.  This will be followed by fluconazole. -He is on empiric Keppra.  Leukocytosis -Secondary to CLL -White blood cell count has normalized down to 10,000 today. -We will continue to  monitor it for now  We will arrange for outpatient follow-up once discharge.  KMikey Bussing NP 03/02/2019 2:45 PM  HPI: Mr. MGlasscockis admitted for cryptococcal meningitis.  States that he is feeling much better.  His headaches have now resolved.  States that he is trying to remain as active as possible and getting up and ambulating.  He is able to walk around his room.  He is actually getting ready to shower at the time of my visit.  He denies chest discomfort shortness of breath.  He has some itching and rash which may be related to either chlorhexidine versus his antifungals.  Rash and itching are tolerable.  He has no other complaints today.  SUMMARY OF ONCOLOGIC HISTORY: Oncology History  [place]  04/01/2018 Initial Diagnosis   [place]   04/05/2018 Pathology Results   Peripheral Blood Flow Cytometry - MONOCLONAL B-CELL POPULATION IDENTIFIED. - SEE NOTE.   04/05/2018 Pathology Results   PB FISH: No evidence of CCND1-IGH [t(11;14)] gene rearrangement. No evidence of trisomy 11 or gain of 11q. (excludes mantle cell lymphoma) No evidence of trisomy 12.  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 No evidence of p53 (17p13( deletion or amplification. No evidence of ATM (11q22.3) deletion.    04/07/2018 Imaging   CT neck w/ contrast: IMPRESSION: Bulky bilateral cervical lymphadenopathy consistent with lymphoproliferative disease/lymphoma.   04/07/2018 Imaging   CT CAP w/ contrast: IMPRESSION: 1. Bulky lymphadenopathy throughout the chest, abdomen and pelvis, as detailed above, highly suspicious for lymphoma. 2. Marked splenomegaly. 3. 9 mm pulmonary nodule within the anterior inferior portion of the RIGHT upper lobe. Subtle low-density nodule within the LEFT lower lobe. These are too small to definitively characterize. Neoplastic pulmonary  nodules cannot be excluded. Consider PET-CT for  further characterization.   04/16/2018 Surgery   Right axillary LN excisional biopsy   04/16/2018 Pathology Results   (Accession: [id number]) Lymph node for lymphoma, Right Axillary - CHRONIC LYMPHOCYTIC LEUKEMIA/SMALL LYMPHOCYTIC LYMPHOMA.     REVIEW OF SYSTEMS:   Constitutional: ( - ) fevers, ( - )  chills , ( - ) night sweats Eyes: ( - ) blurriness of vision, ( - ) double vision, ( - ) watery eyes Ears, nose, mouth, throat, and face: ( - ) mucositis, ( - ) sore throat Respiratory: ( - ) cough, ( - ) dyspnea, ( - ) wheezes Cardiovascular: ( - ) palpitation, ( - ) chest discomfort, ( - ) lower extremity swelling Gastrointestinal:  ( - ) nausea, ( - ) heartburn, ( - ) change in bowel habits Skin: ( + ) abnormal skin rashes Lymphatics: ( - ) new lymphadenopathy, ( - ) easy bruising Neurological: ( - ) numbness, ( - ) tingling, ( - ) new weaknesses Behavioral/Psych: ( - ) mood change, ( - ) new changes  All other systems were reviewed with the patient and are negative.  I have reviewed the past medical history, past surgical history, social history and family history with the patient and they are unchanged from previous note.  ALLERGIES:  is allergic to bactrim [sulfamethoxazole-trimethoprim]; latex; and amoxicillin.  MEDICATIONS:  Current Facility-Administered Medications  Medication Dose Route Frequency Provider Last Rate Last Dose  . 0.9 %  sodium chloride infusion   Intravenous PRN Antonieta Pert, MD   Stopped at 02/26/19 1500  . acetaminophen (TYLENOL) tablet 500 mg  500 mg Oral Q6H PRN Ashok Pall, MD   500 mg at 02/07/19 1849  . acetaminophen (TYLENOL) tablet 650 mg  650 mg Oral Daily PRN Ashok Pall, MD   650 mg at 02/21/19 1027  . acyclovir (ZOVIRAX) tablet 400 mg  400 mg Oral BID Ashok Pall, MD   400 mg at 03/02/19 0857  . amphotericin B liposome (AMBISOME) 250 mg in dextrose 5 % 500 mL IVPB  3 mg/kg (Adjusted) Intravenous Q24H Rolla Flatten, [facility] 250 mL/hr at  03/01/19 1812 250 mg at 03/01/19 1812  . Chlorhexidine Gluconate Cloth 2 % PADS 6 each  6 each Topical Daily Ashok Pall, MD   6 each at 02/23/19 1051  . dextrose 5 % solution   Intravenous Continuous Einar Grad, [facility]   Stopped at 02/28/19 1811  . diphenhydrAMINE (BENADRYL) injection 25 mg  25 mg Intravenous Daily PRN Ashok Pall, MD   25 mg at 03/01/19 1807   Or  . diphenhydrAMINE (BENADRYL) capsule 25 mg  25 mg Oral Daily PRN Ashok Pall, MD   25 mg at 02/25/19 1711  . flucytosine (ANCOBON) capsule 2,500 mg  25 mg/kg Oral Q6H Ashok Pall, MD   2,500 mg at 03/02/19 1311  . HYDROcodone-acetaminophen (NORCO/VICODIN) 5-325 MG per tablet 1 tablet  1 tablet Oral Q4H PRN Ashok Pall, MD   1 tablet at 02/18/19 1943  . hydrocortisone sodium succinate (SOLU-CORTEF) 100 MG injection 50 mg  50 mg Intravenous Daily Powers, Evern Core, MD   50 mg at 03/01/19 1801  . hydrOXYzine (ATARAX/VISTARIL) tablet 25 mg  25 mg Oral TID PRN Antonieta Pert, MD   25 mg at 03/02/19 0857  . loratadine (CLARITIN) tablet 10 mg  10 mg Oral Daily Ashok Pall, MD   10 mg at 03/02/19 0857  . meperidine (DEMEROL) injection 25 mg  25 mg Intravenous Q15 min PRN Ashok Pall, MD      . ondansetron (ZOFRAN) tablet 4 mg  4 mg Oral Q4H PRN Ashok Pall, MD       Or  . ondansetron (ZOFRAN) injection 4 mg  4 mg Intravenous Q4H PRN Ashok Pall, MD      . pantoprazole (PROTONIX) EC tablet 40 mg  40 mg Oral Q supper Alvira Philips, [place]   40 mg at 03/01/19 1651  . potassium chloride SA (KLOR-CON) CR tablet 40 mEq  40 mEq Oral BID Arby Barrette A, NP   40 mEq at 03/02/19 1311  . promethazine (PHENERGAN) tablet 12.5-25 mg  12.5-25 mg Oral Q4H PRN Ashok Pall, MD      . sodium chloride 0.9 % bolus 1,000 mL  1,000 mL Intravenous Q24H Ashok Pall, MD   1,000 mL at 03/01/19 1648  . sodium chloride 0.9 % bolus 1,000 mL  1,000 mL Intravenous Q24H Kc, Ramesh, MD   1,000 mL at 03/01/19 2048  . sodium chloride flush (NS)  0.9 % injection 10-40 mL  10-40 mL Intracatheter PRN Ashok Pall, MD   10 mL at 03/01/19 0516    PHYSICAL EXAMINATION: ECOG PERFORMANCE STATUS: 0 - Asymptomatic  Today's Vitals   03/01/19 2108 03/02/19 0431 03/02/19 0854 03/02/19 1400  BP:  116/84 123/85   Pulse:  86 90   Resp:  18 18   Temp:  98.3 F (36.8 C) 98.2 F (36.8 C)   TempSrc:  Oral Oral   SpO2:  97% 99%   Weight:      Height:      PainSc: 0-No pain   0-No pain   Body mass index is 29.76 kg/m.  Filed Weights   02/15/19 0514 02/18/19 0659 02/20/19 0441  Weight: 203 lb 12.8 oz (92.4 kg) 203 lb 12.8 oz (92.4 kg) 207 lb 6.4 oz (94.1 kg)    GENERAL: alert, no distress and comfortable SKIN: skin color, texture, turgor are normal, no rashes or significant lesions EYES: conjunctiva are pink and non-injected, sclera clear OROPHARYNX: left ear middle canal and tympanic membrane erythema, no drainage; right ear normal  NECK: supple, non-tender LYMPH:  no palpable lymphadenopathy in the cervical LUNGS: clear to auscultation with normal breathing effort HEART: regular rate & rhythm and no murmurs and no lower extremity edema ABDOMEN: soft, non-tender, non-distended, normal bowel sounds Musculoskeletal: no cyanosis of digits and no clubbing  PSYCH: alert & oriented x 3, fluent speech NEURO: no focal motor/sensory deficits  LABORATORY DATA:  I have reviewed the data as listed    Component Value Date/Time   NA 143 03/02/2019 0347   NA 142 03/31/2018 1649   K 3.8 03/02/2019 0347   CL 112 (H) 03/02/2019 0347   CO2 22 03/02/2019 0347   GLUCOSE 105 (H) 03/02/2019 0347   BUN 9 03/02/2019 0347   BUN 16 03/31/2018 1649   CREATININE 1.25 (H) 03/02/2019 0347   CREATININE 1.05 01/28/2019 1225   CALCIUM 9.0 03/02/2019 0347   PROT 4.7 (L) 02/19/2019 0422   PROT 5.9 (L) 03/31/2018 1649   ALBUMIN 3.0 (L) 02/19/2019 0422   ALBUMIN 4.8 02/04/2019 0930   AST 12 (L) 02/19/2019 0422   AST 9 (L) 01/28/2019 1225   ALT 19  02/19/2019 0422   ALT 16 01/28/2019 1225   ALKPHOS 41 02/19/2019 0422   BILITOT 0.7 02/19/2019 0422   BILITOT 0.8 01/28/2019 1225   GFRNONAA >60 03/02/2019 0347   GFRNONAA >60  01/28/2019 1225   GFRAA >60 03/02/2019 0347   GFRAA >60 01/28/2019 1225    No results found for: SPEP, UPEP  Lab Results  Component Value Date   WBC 10.0 03/02/2019   NEUTROABS 6.5 02/09/2019   HGB 12.1 (L) 03/02/2019   HCT 36.7 (L) 03/02/2019   MCV 81.7 03/02/2019   PLT 233 03/02/2019      Chemistry      Component Value Date/Time   NA 143 03/02/2019 0347   NA 142 03/31/2018 1649   K 3.8 03/02/2019 0347   CL 112 (H) 03/02/2019 0347   CO2 22 03/02/2019 0347   BUN 9 03/02/2019 0347   BUN 16 03/31/2018 1649   CREATININE 1.25 (H) 03/02/2019 0347   CREATININE 1.05 01/28/2019 1225      Component Value Date/Time   CALCIUM 9.0 03/02/2019 0347   ALKPHOS 41 02/19/2019 0422   AST 12 (L) 02/19/2019 0422   AST 9 (L) 01/28/2019 1225   ALT 19 02/19/2019 0422   ALT 16 01/28/2019 1225   BILITOT 0.7 02/19/2019 0422   BILITOT 0.8 01/28/2019 1225       RADIOGRAPHIC STUDIES: I have personally reviewed the radiological images as listed below and agreed with the findings in the report. Ct Head Wo Contrast  Result Date: 02/18/2019 CLINICAL DATA:  Patient for VP shunt in OR found to have some blood mixed with CSF, rule out intracranial hemorrhage. EXAM: CT HEAD WITHOUT CONTRAST TECHNIQUE: Contiguous axial images were obtained from the base of the skull through the vertex without intravenous contrast. COMPARISON:  Brain MRI 02/16/2019, head CT 02/16/2019. FINDINGS: Brain: There are external components of a new right-sided shunt system extending to a right parietal burr hole. There are multiple small foci of gas within the adjacent high right frontal lobe, however, no intracranial ventricular catheter is identified. The ventricular system is unchanged in size and configuration as compared to prior MR and CT 02/16/2019  without overt hydrocephalus. No significant acute intracranial hemorrhage. Grossly unchanged region of parenchymal hypodensity within the anterosuperior right frontal lobe extending to involve the anterior body/genu of the corpus callosum. 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 No midline shift or extra-axial fluid collection. Vascular: No hyperdense vessel. Skull: No calvarial fracture or suspicious osseous lesion. Right parietal burr hole. Sinuses/Orbits: Visualized orbits demonstrate no acute abnormality. Mild to moderate ethmoid sinus mucosal thickening. Scattered mild mucosal thickening within remaining paranasal sinuses. No significant mastoid effusion Findings within impression 1 called by telephone at the time of interpretation on 02/18/2019 at 12:46 pm to provider KYLE CABBELL , who verbally acknowledged these results. Per Dr. Christella Noa, the intracranial component of the shunt was not left in place while in the OR, and would not be expected to be seen. IMPRESSION: 1. The external components of a new right-sided shunt system extend to a right parietal burr hole. There are multiple small foci of gas within the adjacent high right frontal lobe, although no intracranial ventricular catheter is present. No significant acute intracranial hemorrhage. No overt hydrocephalus. 2. Grossly unchanged region of hypodensity within the anterosuperior right frontal lobe and callosal anterior body/genu consistent with previously demonstrated cerebritis. Electronically Signed   By: Kellie Simmering   On: 02/18/2019 12:51   Ct Head Wo Contrast  Result Date: 02/16/2019 CLINICAL DATA:  48 year old male with history of CLL and recent cryptococcal meningitis, elevated CSF opening pressure on multiple recent lumbar punctures. EXAM: CT HEAD WITHOUT CONTRAST TECHNIQUE: Contiguous axial images were obtained from  the base of the skull through the vertex without intravenous contrast. COMPARISON:  Head CT  01/25/2019. Brain MRI 01/31/2019. FINDINGS: Brain: There is a new roughly 3 centimeter area of abnormal hypodensity in the anterior right frontal lobe seen on series 3, image 25 and coronal image 20. This might reflect vasogenic edema around a smaller isodense lesion on series 3, image 23. No significant mass effect. No associated hemorrhage. Gray-white matter differentiation elsewhere appears stable and within normal limits. No ventriculomegaly. No cortically based acute infarct identified. Vascular: No suspicious intracranial vascular hyperdensity. Skull: No acute osseous abnormality identified. Sinuses/Orbits: Visualized paranasal sinuses and mastoids are stable and well pneumatized. Other: No acute orbit or scalp soft tissue findings. There are bilateral level 5 lymph nodes mildly increased in size and number on series 4, image 6, up to 8 millimeter short axis. IMPRESSION: 1. New approximately 3 cm hypodense area in the anterior right frontal lobe. This is suspicious for edema and might reflect acute cerebritis in this setting. Recommend repeat Brain MRI without and with contrast to further characterize. 2. No other acute intracranial abnormality. 3. Mildly increased size and number of level 5 lymph nodes compatible with CLL. Electronically Signed   By: Genevie Ann M.D.   On: 02/16/2019 12:41   Mr Jeri Cos YD Contrast  Result Date: 02/16/2019 CLINICAL DATA:  Meningitis due to other and unspecified causes. EXAM: MRI HEAD WITHOUT AND WITH CONTRAST TECHNIQUE: Multiplanar, multiecho pulse sequences of the brain and surrounding structures were obtained without and with intravenous contrast. CONTRAST:  28m GADAVIST GADOBUTROL 1 MMOL/ML IV SOLN COMPARISON:  Head CT 02/16/2019, brain MRI 01/31/2019 FINDINGS: Brain: New from prior MRI 01/31/2019, there is significant supratentorial and infratentorial leptomeningeal enhancement, most prominent along the anterosuperior interhemispheric fissure, and consistent with the  provided history of meningitis. Also new from prior MRI, within the paramedian anterosuperior right frontal lobe there is prominent cortical/subcortical edema which extends into the anterior body/genu of the corpus callosum. Associated irregular foci of restricted diffusion within the region of right frontal lobe parenchymal signal abnormality. The constellation of findings are consistent with cerebritis. No formed parenchymal abscess on the current examination. Of note, abnormal leptomeningeal enhancement extends into the left internal auditory canal (series 10, image 13). No midline shift, hydrocephalus or extra-axial fluid collection. No chronic intracranial blood products. Additional mild scattered foci of T2/FLAIR hyperintensity within the cerebral white matter are nonspecific, but may reflect early chronic small vessel ischemic disease. Vascular: Flow voids maintained within the proximal large arterial vessels. Expected enhancement within the dural venous sinuses. Skull and upper cervical spine: No focal marrow lesion. Sinuses/Orbits: Visualized orbits demonstrate no acute abnormality. Mild ethmoid sinus mucosal thickening. No significant mastoid effusion. Other: Redemonstrated prominent lymph nodes within the upper neck consistent with the provided history of CLL. These results will be called to the ordering clinician or representative by the Radiologist Assistant, and communication documented in the PACS or zVision Dashboard. IMPRESSION: 1. Abnormal leptomeningeal enhancement new from prior MRI 01/31/2019 and consistent with the provided history of meningitis. Enhancement is most prominent along the anterosuperior interhemispheric fissure. Also of note, abnormal leptomeningeal enhancement extends into the left IAC. 2. 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 Associated irregular restricted diffusion  within the region of right frontal lobe parenchymal signal abnormality. No formed abscess on the current study. 3. No midline shift, hydrocephalus or extra-axial fluid collection. Electronically Signed   By: KMarylyn Ishihara  Armandina Gemma   On: 02/16/2019 23:24   Mr Jeri Cos MK Contrast  Result Date: 01/31/2019 CLINICAL DATA:  Worsening headaches and neck pain for 2 days. Double vision. EXAM: MRI HEAD WITHOUT AND WITH CONTRAST TECHNIQUE: Multiplanar, multiecho pulse sequences of the brain and surrounding structures were obtained without and with intravenous contrast. CONTRAST:  19m GADAVIST GADOBUTROL 1 MMOL/ML IV SOLN COMPARISON:  Head CT 01/25/2019 FINDINGS: Brain: There is no evidence of acute infarct, intracranial hemorrhage, mass, midline shift, or extra-axial fluid collection. The ventricles are normal in size. A subcentimeter chronic infarct is questioned in the posterior right cerebellar hemisphere (versus normal variant focal sulcal enlargement). Punctate foci of T2 hyperintensity are scattered in the juxtacortical greater than deep cerebral white matter bilaterally, overall mild but abnormal for age. No abnormal enhancement is identified. Vascular: Major intracranial vascular flow voids are preserved. Skull and upper cervical spine: Unremarkable bone marrow signal. Sinuses/Orbits: Unremarkable orbits. Mild right ethmoid air cell mucosal thickening. Clear mastoid air cells. Other: None. IMPRESSION: 1. No acute intracranial abnormality or mass. 2. Mild cerebral white matter T2 signal changes, nonspecific though may reflect migraines, early chronic small vessel ischemic disease, or prior infection/inflammation. Electronically Signed   By: ALogan BoresM.D.   On: 01/31/2019 15:35   UK[place]Ekg Site Rite  Result Date: 02/08/2019 If Site Rite image not attached, placement could not be confirmed due to current cardiac rhythm.  Dg Fl Guided Lumbar Puncture  Result Date: 02/12/2019 CLINICAL DATA:  Cryptococcal meningitis.  EXAM: DIAGNOSTIC LUMBAR PUNCTURE UNDER FLUOROSCOPIC GUIDANCE FLUOROSCOPY TIME:  Fluoroscopy Time:  0.1 minute Radiation Exposure Index (if provided by the fluoroscopic device): 0.4 mGy Number of Acquired Spot Images: 0 PROCEDURE: Informed consent was obtained from the patient prior to the procedure, including potential complications of headache, allergy, and pain. With the patient prone, the lower back was prepped with Betadine. 1% Lidocaine was used for local anesthesia. Lumbar puncture was performed at the L4-5 level using a 22 gauge needle with return of yellow CSF with an opening pressure of 29 cm water. 13 ml of CSF were obtained for laboratory studies. Closing pressure was 12 cm of water. The patient tolerated the procedure well and there were no apparent complications. IMPRESSION: Successful fluoroscopic guided lumbar puncture. Electronically Signed   By: HKathreen Devoid  On: 02/12/2019 11:58   Dg Fl Guided Lumbar Puncture  Result Date: 02/11/2019 CLINICAL DATA:  Cryptococcal meningitis EXAM: DIAGNOSTIC LUMBAR PUNCTURE UNDER FLUOROSCOPIC GUIDANCE FLUOROSCOPY TIME:  Fluoroscopy Time:  1 minutes Radiation Exposure Index (if provided by the fluoroscopic device): 6.2 mGy Number of Acquired Spot Images: 0 PROCEDURE: Informed consent was obtained from the patient prior to the procedure, including potential complications of headache, allergy, and pain. With the patient prone, the lower back was prepped with Betadine. 1% Lidocaine was used for local anesthesia. Lumbar puncture was performed at the L3-4 level using a 22 gauge needle with return of clear CSF with an opening pressure of 28 cm water. Twenty-one ml of CSF were obtained for laboratory studies. The patient tolerated the procedure well and there were no apparent complications. IMPRESSION: 1. Lumbar puncture with removal of 21 cc of clear CSF. Opening pressure was equal to 28 cm of water. Electronically Signed   By: TKerby MoorsM.D.   On: 02/11/2019  11:47   Dg Fl Guided Lumbar Puncture  Result Date: 02/09/2019 CLINICAL DATA:  48year old male with history of meningitis. EXAM: DIAGNOSTIC LUMBAR PUNCTURE UNDER FLUOROSCOPIC GUIDANCE FLUOROSCOPY TIME:  Fluoroscopy Time:  24 seconds Radiation Exposure Index (if provided by the fluoroscopic device): 2.7 mGy PROCEDURE: Informed consent was obtained from the patient prior to the procedure, including potential complications of headache, allergy, and pain. With the patient prone, the lower back was prepped with Betadine. 1% Lidocaine was used for local anesthesia. Lumbar puncture was performed at the L3-L4 level using a 20 gauge needle with return of clear CSF with an opening pressure of 28 cm water. 14 ml of CSF were obtained for laboratory studies. The patient tolerated the procedure well and there were no apparent complications. IMPRESSION: 1. Successful uncomplicated fluoroscopic guided lumbar puncture, as above. Electronically Signed   By: Vinnie Langton M.D.   On: 02/09/2019 11:52   Dg Fl Guided Lumbar Puncture  Result Date: 02/04/2019 CLINICAL DATA:  Headache and diplopia.  Personal history of CLL. EXAM: DIAGNOSTIC LUMBAR PUNCTURE UNDER FLUOROSCOPIC GUIDANCE FLUOROSCOPY TIME:  Radiation Exposure Index (if provided by the fluoroscopic device): 20.43 uGy*m2 PROCEDURE: Informed consent was obtained from the patient prior to the procedure, including potential complications of headache, allergy, and pain. With the patient prone, the lower back was prepped with Betadine. 1% Lidocaine was used for local anesthesia. Lumbar puncture was performed at the L2-3 level using a 22 gauge needle with return of clear CSF with an opening pressure of greater than 55 cm water. 29.0 ml of CSF were obtained for laboratory studies. The closing pressure was normalized at 10 cm. The patient tolerated the procedure well and there were no apparent complications. He reported improvement of his headaches and vision as the CSF was  collected. IMPRESSION: 1. Technically successful fluoroscopic guided lumbar puncture via a right paramedian approach at the L2-3 level. 2. Markedly elevated opening pressure greater than 55 cm water. This was normalized after removal of 29 cm of clear CSF. 3. Improved headaches and visual symptoms with reduction of the CSF pressure. Electronically Signed   By: San Morelle M.D.   On: 02/04/2019 11:16

## 2019-03-03 DIAGNOSIS — L270 Generalized skin eruption due to drugs and medicaments taken internally: Secondary | ICD-10-CM

## 2019-03-03 LAB — CBC
HCT: 36.9 % — ABNORMAL LOW (ref 39.0–52.0)
Hemoglobin: 12 g/dL — ABNORMAL LOW (ref 13.0–17.0)
MCH: 26.7 pg (ref 26.0–34.0)
MCHC: 32.5 g/dL (ref 30.0–36.0)
MCV: 82.2 fL (ref 80.0–100.0)
Platelets: 228 10*3/uL (ref 150–400)
RBC: 4.49 MIL/uL (ref 4.22–5.81)
RDW: 13.5 % (ref 11.5–15.5)
WBC: 10.2 10*3/uL (ref 4.0–10.5)
nRBC: 0 % (ref 0.0–0.2)

## 2019-03-03 LAB — BASIC METABOLIC PANEL
Anion gap: 7 (ref 5–15)
BUN: 11 mg/dL (ref 6–20)
CO2: 23 mmol/L (ref 22–32)
Calcium: 8.9 mg/dL (ref 8.9–10.3)
Chloride: 113 mmol/L — ABNORMAL HIGH (ref 98–111)
Creatinine, Ser: 1.46 mg/dL — ABNORMAL HIGH (ref 0.61–1.24)
GFR calc Af Amer: >60 mL/min (ref >60)
GFR calc non Af Amer: 57 mL/min — ABNORMAL LOW (ref >60)
Glucose, Bld: 99 mg/dL (ref 70–99)
Potassium: 3.9 mmol/L (ref 3.5–5.1)
Sodium: 143 mmol/L (ref 135–145)

## 2019-03-03 LAB — MAGNESIUM: Magnesium: 1.9 mg/dL (ref 1.7–2.4)

## 2019-03-03 MED ORDER — DIPHENHYDRAMINE HCL 25 MG PO CAPS: 25.0000 mg | ORAL_CAPSULE | Freq: Every day | ORAL | Status: DC | PRN

## 2019-03-03 MED ORDER — HYDROCORTISONE NA SUCCINATE PF 100 MG IJ SOLR
50.0000 mg | Freq: Once | INTRAMUSCULAR | Status: AC
  Administered 2019-03-03: 50 mg via INTRAVENOUS
  Filled 2019-03-03: qty 2

## 2019-03-03 MED ORDER — DIPHENHYDRAMINE HCL 50 MG/ML IJ SOLN: 25.0000 mg | Freq: Four times a day (QID) | INTRAMUSCULAR | Status: DC | PRN

## 2019-03-03 MED ORDER — HYDROXYZINE HCL 25 MG PO TABS
50.0000 mg | ORAL_TABLET | Freq: Three times a day (TID) | ORAL | Status: DC | PRN
  Administered 2019-03-03 – 2019-03-04 (×3): 50 mg via ORAL
  Filled 2019-03-03 (×3): qty 2

## 2019-03-03 NOTE — Progress Notes (Signed)
Patient has a rash around his abdomen, back, arms and legs. Hospitalist added atarax and benadryl, infections disease added solucortef. Rash continues to spread, it is red, raised, and itching. Hospitalist and ID notified.

## 2019-03-03 NOTE — Progress Notes (Signed)
Contacted Dr.Cabbell's office and spoke with his assistant regarding staples. PA will reach out to [facility] and place orders to remove staples when appropriate.

## 2019-03-03 NOTE — Progress Notes (Signed)
[place] for Infectious Disease  Date of Admission:  02/07/2019     Total days of antibiotics 25         ASSESSMENT:  Mr. Eavey has a worsening diffuse macular rash located primarily on his trunk. No sign of anaphylaxis. Discussed plan of care to include increasing frequency of benadryl and primary team working with corticosteroids. He is about 3 days away from completion of therapy and will discuss changing regimen to high dose fluconazole if these other interventions do not improve his symptoms. Unfortunately there are no alternative regimens to change to for the treatment of cryptococcal meningitis. For now will continue current dose of Ampho B and 5FU.   PLAN:  1. Continue Ampho B and 5 FU. 2. Continue Benadryl and corticosteroids for itching.  3. Will discuss changing to fluconazole if symptoms do not improve.   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 acyclovir  400 mg Oral BID  . Chlorhexidine Gluconate Cloth  6 each Topical Daily  . flucytosine  25 mg/kg Oral Q6H  . hydrocortisone sod succinate (SOLU-CORTEF) inj  50 mg Intravenous Daily  . hydrocortisone sod succinate (SOLU-CORTEF) inj  50 mg Intravenous Once  . loratadine  10 mg Oral Daily  . pantoprazole  40 mg Oral Q supper  . potassium chloride  40 mEq Oral BID  . sodium chloride  1,000 mL Intravenous Q24H  . sodium chloride  1,000 mL Intravenous Q24H    SUBJECTIVE:  Afebrile overnight. Nursing concerned regarding worsening of diffuse macular rash extending around trunk and and back. No fevers, chills or sweats. Rash described as itchy and which has been refractory to benadryl and corticosteroids. Remains tolerable at present but severity of itching kept him from sleeping last night.   Allergies  Allergen Reactions  . Bactrim  [Sulfamethoxazole-Trimethoprim] Rash  . Latex Other (See Comments)    Took skin off when removed tape  . Amoxicillin Rash    Has patient had a PCN reaction causing immediate rash, facial/tongue/throat swelling, SOB or lightheadedness with hypotension: No Has patient had a PCN reaction causing severe rash involving mucus membranes or skin necrosis: No Has patient had a PCN reaction that required hospitalization: No Has patient had a PCN reaction occurring within the last 10 years: No If all of the above answers are "NO", then may proceed with Cephalosporin use.      Review of Systems: Review of Systems  Constitutional: Negative for chills, fever and weight loss.  Respiratory: Negative for cough, shortness of breath and wheezing.   Cardiovascular: Negative for chest pain and leg swelling.  Gastrointestinal: Negative for abdominal pain, constipation, diarrhea, nausea and vomiting.  Skin: Positive for itching and rash.      OBJECTIVE: Vitals:   03/02/19 2036 03/03/19 0506 03/03/19 0859 03/03/19 1643  BP: 127/81 117/82 119/77 117/84  Pulse: 90 83 80 81  Resp: 16 15 18 18   Temp: 98.4 F (36.9 C) 98.1 F (36.7 C) 98 F (36.7 C) 98.4 F (36.9 C)  TempSrc: Oral Oral Oral Oral  SpO2: 99% 99% 98% 100%  Weight:      Height:       Body mass index is 29.76 kg/m.  Physical Exam Constitutional:      General: He is not in acute distress.    Appearance: Normal appearance.  He is well-developed.  Cardiovascular:     Rate and Rhythm: Normal rate and regular rhythm.     Heart sounds: Normal heart sounds.  Pulmonary:     Effort: Pulmonary effort is normal.     Breath sounds: Normal breath sounds.  Skin:    General: Skin is warm and dry.     Comments: Diffuse and generalized macular drug like rash located primarily on the trunk.   Neurological:     Mental Status: He is alert and oriented to person, place, and time.  Psychiatric:        Behavior: Behavior normal.        Thought  Content: Thought content normal.        Judgment: Judgment normal.     Lab Results Lab Results  Component Value Date   WBC 10.2 03/03/2019   HGB 12.0 (L) 03/03/2019   HCT 36.9 (L) 03/03/2019   MCV 82.2 03/03/2019   PLT 228 03/03/2019    Lab Results  Component Value Date   CREATININE 1.46 (H) 03/03/2019   BUN 11 03/03/2019   NA 143 03/03/2019   K 3.9 03/03/2019   CL 113 (H) 03/03/2019   CO2 23 03/03/2019    Lab Results  Component Value Date   ALT 19 02/19/2019   AST 12 (L) 02/19/2019   ALKPHOS 41 02/19/2019   BILITOT 0.7 02/19/2019     Microbiology: No results found for this or any previous visit (from the past 240 hour(s)).   Terri Piedra, NP [facility] for [place] [phone] Pager  03/03/2019  4:49 PM

## 2019-03-03 NOTE — Progress Notes (Addendum)
Pharmacy Antibiotic Note  David Whitehead is a 48 y.o. 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 Pharmacy has been consulted for amphotericin B liposome dosing. Ambisome 3 mg/kg and flucytosine 25 mg/kg started in this patient with suspected cryptococcal meningitis. Normal saline ordered to be given an hour before and an hour after the Ambisome infusion with D5 flushes administered inbetween. The pharmacy team will monitor the patients renal function, potassium and magnesium and make adjustments per protocol.  Potassium today is 3.9 after 100 mEq of replacement yesterday. Mg is stable at 1.9.  Will continue potassium chloride 40 mEq BID.   SCr is up to 1.46 today. Will continue 1 L of fluid pre and post dose of amphotericin B. Platelets are stable at 228. A flucytosine level has been sent to [facility] for monitoring.   Plan: Continue Ambisome 250 mg q24h (3 mg/kg) Continue Flucytosine 2,500 mg q6h (25 mg/kg) Continue potassium 40 mEq BID Continue pre-dose fluid at 1000 mL and  post dose fluid at 1000 mL Monitor Scr and CBC   Height: 5\' 10"  (177.8 cm) Weight: 207 lb 6.4 oz (94.1 kg) IBW/kg (Calculated) : 73  Temp (24hrs), Avg:98.2 F (36.8 C), Min:98.1 F (36.7 C), Max:98.4 F (36.9 C)  Recent Labs  Lab 02/27/19 0402 02/28/19 0324 03/01/19 0500 03/02/19 0347 03/03/19 0411  WBC 8.4 9.4 9.1 10.0 10.2  CREATININE 1.45* 1.32* 1.35* 1.25* 1.46*    Estimated Creatinine Clearance: 72.8 mL/min (A) (by C-G formula based on SCr of 1.46 mg/dL (H)).    Allergies  Allergen Reactions  . Bactrim [Sulfamethoxazole-Trimethoprim] Rash  . Latex Other (See Comments)    Took skin off when removed tape  . Amoxicillin Rash    Has patient had a PCN reaction causing immediate rash, facial/tongue/throat swelling, SOB or lightheadedness with hypotension: No Has patient had a PCN reaction causing severe rash involving mucus membranes or skin necrosis: No Has patient had a PCN  reaction that required hospitalization: No Has patient had a PCN reaction occurring within the last 10 years: No If all of the above answers are "NO", then may proceed with Cephalosporin use.     Antimicrobials this admission: Ambisome  9/28>> Flucytosine 9/28>>   Thank you for allowing pharmacy to be a part of this patient's care.  Jimmy Footman, PharmD, BCPS, BCIDP Infectious Diseases Clinical Pharmacist Phone: [phone] 03/03/2019 8:10 AM

## 2019-03-03 NOTE — Progress Notes (Signed)
PROGRESS NOTE    David Whitehead  [id number] DOB: 12-30-1970 DOA: 02/07/2019 PCP: [name], MD   Brief Narrative: 48 year old male with history of CLL, hypertension, diabetes type 2 who presented with headache, diplopia. He underwent work-up for headache as an outpatient. He was seen by neurology and underwent lumbar puncture under fluoroscopy and was found to have elevated opening pressure of 55. He noted immediate improvement in headache after lumbar puncture. Cryptococcal antigen titers came out to be positive at 1: 64 and patient was sent to the emergency department. ID consulted after admission. Started on antibiotics. His symptoms of headache and diplopia have significantly improved. Hospital course remarkable for persistent elevated opening pressure so he has been undergoing series of lumbar punctures.  Subjective: Complains of itching Has Rash on lower belly and back No fever/ neck stiffness  Assessment & Plan:   Cryptococcal meningitis, .MRI of the brain on 02/16/2019 shows abnormal leptomeningeal enhancement and cerebritis. 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 Followed by infectious disease and currently on induction therapy with amphotericin and flucytosine x4 wk. following which he needs to start on consolidative therapy with fluconazole 40 mg daily x8 weeks then 200 mg daily for a year.  Medication being dosed as per pharmacy with monitoring of electrolytes magnesium potassium and creatinine and has pre-/post hydration with therapy Continue on empiric Keppra, cont acyclovir.    CLL history and chemotherapy which is on hold.  Follow-up with Dr. Maylon Peppers as outpatient.   Hypertension:BP controlled and monitor.    Mild AKI creatinine peaked at 1.5 with IV fluids overall stable.   Cont gentle hydration with normal saline pre and post infusion with dextrose flush in between  Recent Labs  Lab 02/27/19 0402 02/28/19 0324 03/01/19 0500 03/02/19  0347 03/03/19 0411  BUN 7 8 9 9 11   CREATININE 1.45* 1.32* 1.35* 1.25* [id number]*    Metabolic acidosis with AKI: Bicarb improved.   Rash:? From amphoterics vs CHG- on bandryl with amphotericin.  Rash some better after  ID started IV hydrocortisone.  Continue and taper.increase atarax to 50 mg due  To ongoing itching, also add prn benadryl which seems to relieve better as per patient.  Body mass index is 29.76 kg/m.    DVT prophylaxis: SCD/ambulating Code Status: Full code  family Communication: plan of care discussed with patient in detail.  I have offered to call his wife multiple times-he declined states he has been in talk with her, and she is fine.  Disposition Plan: Remains inpatient pending completion anti cryptococcal treatment with  induction therapy  Consultants: Neurosurgery, infectious disease  Procedures: Lumbar puncture  Microbiology: CSF culture no growth to date. COVID-19 -9/28  Antimicrobials: Anti-infectives (From admission, onward)   Start     Dose/Rate Route Frequency Ordered Stop   02/07/19 2230  atovaquone (MEPRON) 750 MG/5ML suspension 750 mg  Status:  Discontinued     750 mg Oral Daily with breakfast 02/07/19 1729 02/10/19 1543   02/07/19 2200  acyclovir (ZOVIRAX) tablet 400 mg     400 mg Oral 2 times daily 02/07/19 1729     02/07/19 1800  flucytosine (ANCOBON) capsule 2,500 mg     25 mg/kg  95.7 kg Oral Every 6 hours 02/07/19 1528     02/07/19 1800  amphotericin B liposome (AMBISOME) 250 mg in dextrose 5 % 500 mL IVPB     3 mg/kg  82.1 kg (Adjusted) 250 mL/hr over 120 Minutes Intravenous Every 24 hours 02/07/19 1528  Objective: Vitals:   03/02/19 0431 03/02/19 0854 03/02/19 2036 03/03/19 0506  BP: 116/84 123/85 127/81 117/82  Pulse: 86 90 90 83  Resp: 18 18 16 15   Temp: 98.3 F (36.8 C) 98.2 F (36.8 C) 98.4 F (36.9 C) 98.1 F (36.7 C)  TempSrc: Oral Oral Oral Oral  SpO2: 97% 99% 99% 99%  Weight:      Height:         Intake/Output Summary (Last 24 hours) at 03/03/2019 0829 Last data filed at 03/03/2019 0601 Gross per 24 hour  Intake 3001.67 ml  Output -  Net 3001.67 ml   Filed Weights   02/15/19 0514 02/18/19 0659 02/20/19 0441  Weight: 92.4 kg 92.4 kg 94.1 kg   Weight change:   Body mass index is 29.76 kg/m.  Intake/Output from previous day: 10/21 0701 - 10/22 0700 In: 3001.7 [P.O.:900; IV Piggyback:2101.7] Out: -  Intake/Output this shift: No intake/output data recorded.  Examination:  General exam: AAO,NAD, on room air not in acute distress il HEENT:Oral mucosa moist, Ear/Nose WNL grossly, dentition normal. Respiratory system: b/l clear no crackles or wheezing  Cardiovascular system: S1 & S2 +, No JVD, regular RR. Gastrointestinal system: Abdomen soft, NT,ND, BS+ VP shunt in place with glue on wound Nervous System:Alert, awake, moving extremities and grossly nonfocal Extremities: No edema, distal peripheral pulses palpable.  Skin: rash erythematous on lower back and lower abdomen, some on upper arm MSK: Normal muscle bulk,tone, power  Medications:  Scheduled Meds: . acyclovir  400 mg Oral BID  . Chlorhexidine Gluconate Cloth  6 each Topical Daily  . flucytosine  25 mg/kg Oral Q6H  . hydrocortisone sod succinate (SOLU-CORTEF) inj  50 mg Intravenous Daily  . loratadine  10 mg Oral Daily  . pantoprazole  40 mg Oral Q supper  . potassium chloride  40 mEq Oral BID  . sodium chloride  1,000 mL Intravenous Q24H  . sodium chloride  1,000 mL Intravenous Q24H   Continuous Infusions: . sodium chloride Stopped (02/26/19 1500)  . amphotericin  B  Liposome (AMBISOME) ADULT IV 250 mg (03/02/19 1859)  . dextrose Stopped (02/28/19 1811)    Data Reviewed: I have personally reviewed following labs and imaging studies  CBC: Recent Labs  Lab 02/27/19 0402 02/28/19 0324 03/01/19 0500 03/02/19 0347 03/03/19 0411  WBC 8.4 9.4 9.1 10.0 10.2  HGB 12.1* 11.8* 11.9* 12.1* 12.0*  HCT 37.9*  36.1* 37.0* 36.7* 36.9*  MCV 83.5 82.2 82.8 81.7 82.2  PLT 183 184 208 233 [id number]   Basic Metabolic Panel: Recent Labs  Lab 02/27/19 0402 02/28/19 0324 03/01/19 0500 03/02/19 0347 03/03/19 0411  NA 141 143 141 143 143  K 4.3 4.3 3.8 3.8 3.9  CL 113* 115* 110 112* 113*  CO2 21* 23 22 22 23   GLUCOSE 106* 129* 90 105* 99  BUN 7 8 9 9 11   CREATININE 1.45* 1.32* 1.35* 1.25* 1.46*  CALCIUM 8.2* 8.6* 8.9 9.0 8.9  MG 2.0 1.9 1.8 1.9 1.9   GFR: Estimated Creatinine Clearance: 72.8 mL/min (A) (by C-G formula based on SCr of 1.46 mg/dL (H)). Liver Function Tests: No results for input(s): AST, ALT, ALKPHOS, BILITOT, PROT, ALBUMIN in the last 168 hours. No results for input(s): LIPASE, AMYLASE in the last 168 hours. No results for input(s): AMMONIA in the last 168 hours. Coagulation Profile: No results for input(s): INR, PROTIME in the last 168 hours. Cardiac Enzymes: No results for input(s): CKTOTAL, CKMB, CKMBINDEX, TROPONINI in the last 168  hours. BNP (last 3 results) No results for input(s): PROBNP in the last 8760 hours. HbA1C: No results for input(s): HGBA1C in the last 72 hours. CBG: No results for input(s): GLUCAP in the last 168 hours. Lipid Profile: No results for input(s): CHOL, HDL, LDLCALC, TRIG, CHOLHDL, LDLDIRECT in the last 72 hours. Thyroid Function Tests: No results for input(s): TSH, T4TOTAL, FREET4, T3FREE, THYROIDAB in the last 72 hours. Anemia Panel: No results for input(s): VITAMINB12, FOLATE, FERRITIN, TIBC, IRON, RETICCTPCT in the last 72 hours. Sepsis Labs: No results for input(s): PROCALCITON, LATICACIDVEN in the last 168 hours.  No results found for this or any previous visit (from the past 240 hour(s)).    Radiology Studies: No results found.    LOS: 24 days   Time spent: More than 50% of that time was spent in counseling and/or coordination of care.  Antonieta Pert, MD [facility]  03/03/2019, 8:29 AM

## 2019-03-04 LAB — HEPATIC FUNCTION PANEL
ALT: 26 U/L (ref 0–44)
AST: 18 U/L (ref 15–41)
Albumin: 3.6 g/dL (ref 3.5–5.0)
Alkaline Phosphatase: 54 U/L (ref 38–126)
Bilirubin, Direct: 0.2 mg/dL (ref 0.0–0.2)
Indirect Bilirubin: 0.2 mg/dL — ABNORMAL LOW (ref 0.3–0.9)
Total Bilirubin: 0.4 mg/dL (ref 0.3–1.2)
Total Protein: 5.2 g/dL — ABNORMAL LOW (ref 6.5–8.1)

## 2019-03-04 LAB — BASIC METABOLIC PANEL
Anion gap: 8 (ref 5–15)
BUN: 11 mg/dL (ref 6–20)
CO2: 23 mmol/L (ref 22–32)
Calcium: 9 mg/dL (ref 8.9–10.3)
Chloride: 111 mmol/L (ref 98–111)
Creatinine, Ser: 1.39 mg/dL — ABNORMAL HIGH (ref 0.61–1.24)
GFR calc Af Amer: >60 mL/min (ref >60)
GFR calc non Af Amer: >60 mL/min (ref >60)
Glucose, Bld: 108 mg/dL — ABNORMAL HIGH (ref 70–99)
Potassium: 3.9 mmol/L (ref 3.5–5.1)
Sodium: 142 mmol/L (ref 135–145)

## 2019-03-04 LAB — CBC
HCT: 39.4 % (ref 39.0–52.0)
Hemoglobin: 12.7 g/dL — ABNORMAL LOW (ref 13.0–17.0)
MCH: 26.7 pg (ref 26.0–34.0)
MCHC: 32.2 g/dL (ref 30.0–36.0)
MCV: 82.8 fL (ref 80.0–100.0)
Platelets: 241 10*3/uL (ref 150–400)
RBC: 4.76 MIL/uL (ref 4.22–5.81)
RDW: 13.5 % (ref 11.5–15.5)
WBC: 12.3 10*3/uL — ABNORMAL HIGH (ref 4.0–10.5)
nRBC: 0 % (ref 0.0–0.2)

## 2019-03-04 LAB — MAGNESIUM: Magnesium: 1.9 mg/dL (ref 1.7–2.4)

## 2019-03-04 MED ORDER — FLUCONAZOLE 100 MG PO TABS
400.0000 mg | ORAL_TABLET | Freq: Every day | ORAL | Status: DC
  Administered 2019-03-04 – 2019-03-06 (×3): 400 mg via ORAL
  Filled 2019-03-04 (×3): qty 4

## 2019-03-04 MED ORDER — PREDNISONE 20 MG PO TABS
40.0000 mg | ORAL_TABLET | Freq: Every day | ORAL | Status: AC
  Administered 2019-03-04 – 2019-03-06 (×3): 40 mg via ORAL
  Filled 2019-03-04 (×4): qty 2

## 2019-03-04 MED ORDER — HYDROXYZINE HCL 25 MG PO TABS
50.0000 mg | ORAL_TABLET | Freq: Three times a day (TID) | ORAL | Status: DC
  Administered 2019-03-04 – 2019-03-06 (×8): 50 mg via ORAL
  Filled 2019-03-04 (×8): qty 2

## 2019-03-04 NOTE — Plan of Care (Signed)
  Problem: Pain Managment: Goal: General experience of comfort will improve Outcome: Progressing   Problem: Skin Integrity: Goal: Risk for impaired skin integrity will decrease Outcome: Progressing   

## 2019-03-04 NOTE — Progress Notes (Signed)
[place] for Infectious Disease  Date of Admission:  02/07/2019     Total days of antibiotics 26         ASSESSMENT:  Mr. Wnek has completed 25 out of 28 days of induction therapy for cryptococcal meningitis and subsequently has developed a drug rash from the medication. Given increasing rash will transition Ampho B and 5FU to high dose fluconazole for 8 weeks and then transition to maintenance therapy with 200 mg of fluconazole for 6-12 months. Will treat remaining rash with short burst of prednisone.   PLAN:  1. Discontinue Amphoteracin B and 5FU 2. Start fluconazole daily for 8 weeks. 3. Monitor rash for clearance with discontinuation of induction therapy.  4. Discuss CLL treatment with oncology.  5. Prednisone for drug rash.   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 acyclovir  400 mg Oral BID  . Chlorhexidine Gluconate Cloth  6 each Topical Daily  . hydrOXYzine  50 mg Oral TID  . loratadine  10 mg Oral Daily  . pantoprazole  40 mg Oral Q supper  . potassium chloride  40 mEq Oral BID  . predniSONE  40 mg Oral Daily  . sodium chloride  1,000 mL Intravenous Q24H  . sodium chloride  1,000 mL Intravenous Q24H    SUBJECTIVE:  Afebrile overnight with no acute events. In good spirits today and ready to go home. Continues to have drug related rash and pruritis.   Allergies  Allergen Reactions  . Bactrim [Sulfamethoxazole-Trimethoprim] Rash  . Latex Other (See Comments)    Took skin off when removed tape  . Amoxicillin Rash    Has patient had a PCN reaction causing immediate rash, facial/tongue/throat swelling, SOB or lightheadedness with hypotension: No Has patient had a PCN reaction causing severe rash involving mucus membranes or skin necrosis: No Has patient had a PCN reaction that required  hospitalization: No Has patient had a PCN reaction occurring within the last 10 years: No If all of the above answers are "NO", then may proceed with Cephalosporin use.      Review of Systems: Review of Systems  Constitutional: Negative for chills, fever and weight loss.  Respiratory: Negative for cough, shortness of breath and wheezing.   Cardiovascular: Negative for chest pain and leg swelling.  Gastrointestinal: Negative for abdominal pain, constipation, diarrhea, nausea and vomiting.  Skin: Positive for itching and rash.      OBJECTIVE: Vitals:   03/03/19 1643 03/03/19 2022 03/04/19 0505 03/04/19 0919  BP: 117/84 114/82 110/74 123/81  Pulse: 81 (!) 107 88 (!) 101  Resp: 18 16 16 20   Temp: 98.4 F (36.9 C) 98.9 F (37.2 C) 98.2 F (36.8 C) 98.2 F (36.8 C)  TempSrc: Oral Oral Oral Oral  SpO2: 100% 100% 98% 99%  Weight:      Height:       Body mass index is 29.76 kg/m.  Physical Exam Constitutional:      General: He is not in acute distress.    Appearance: He is well-developed.     Comments: Seated in the chair next to the bed; pleasant.   HENT:     Head:     Comments: Surgical incision is clean and dry; staples removed.  Cardiovascular:     Rate and Rhythm: Normal rate and regular rhythm.  Heart sounds: Normal heart sounds.  Pulmonary:     Effort: Pulmonary effort is normal.     Breath sounds: Normal breath sounds.  Skin:    General: Skin is warm and dry.  Neurological:     Mental Status: He is alert and oriented to person, place, and time.  Psychiatric:        Behavior: Behavior normal.        Thought Content: Thought content normal.        Judgment: Judgment normal.     Lab Results Lab Results  Component Value Date   WBC 12.3 (H) 03/04/2019   HGB 12.7 (L) 03/04/2019   HCT 39.4 03/04/2019   MCV 82.8 03/04/2019   PLT 241 03/04/2019    Lab Results  Component Value Date   CREATININE 1.39 (H) 03/04/2019   BUN 11 03/04/2019   NA 142  03/04/2019   K 3.9 03/04/2019   CL 111 03/04/2019   CO2 23 03/04/2019    Lab Results  Component Value Date   ALT 26 03/04/2019   AST 18 03/04/2019   ALKPHOS 54 03/04/2019   BILITOT 0.4 03/04/2019     Microbiology: No results found for this or any previous visit (from the past 240 hour(s)).   Terri Piedra, NP [facility] for [place] [facility] [phone] Pager  03/04/2019  1:42 PM

## 2019-03-04 NOTE — Progress Notes (Signed)
PROGRESS NOTE    David Whitehead  [id number] DOB: 1970-08-22 DOA: 02/07/2019 PCP: [name], MD   Brief Narrative: 48 year old male with history of CLL, hypertension, diabetes type 2 who presented with headache, diplopia. He underwent work-up for headache as an outpatient. He was seen by neurology and underwent lumbar puncture under fluoroscopy and was found to have elevated opening pressure of 55. He noted immediate improvement in headache after lumbar puncture. Cryptococcal antigen titers came out to be positive at 1: 64 and patient was sent to the emergency department. ID consulted after admission. Started on antibiotics. His symptoms of headache and diplopia have significantly improved. Hospital course remarkable for persistent elevated opening pressure so he has been undergoing series of lumbar punctures.  Subjective:  Complains of rash worse today and also having itching No fever, no neck stiffness  Assessment & Plan:   Cryptococcal meningitis, .MRI of the brain on 02/16/2019 shows abnormal leptomeningeal enhancement and cerebritis. 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 Followed by infectious disease and currently on induction therapy with amphotericin and flucytosine x4 wk. following which he needs to start on consolidative therapy with fluconazole 40 mg daily x8 weeks then 200 mg daily for a year.  Medication being dosed as per pharmacy with monitoring of electrolytes magnesium potassium and creatinine and has pre-/post hydration with therapy. Also on empiric Keppra, acyclovir.   induction therapy being discontinued due to his drug rash-changing to oral therapy. Cont per ID  CLL history and chemotherapy which is on hold.  Follow-up with Dr. Maylon Peppers as outpatient.   Hypertension:BP controlled , not on meds  Mild AKI creatinine peaked at 1.5 with IV fluids overall stable.  Continues on oral hydration. Recent Labs  Lab 02/28/19 0324 03/01/19 0500  03/02/19 0347 03/03/19 0411 03/04/19 0424  BUN 8 9 9 11 11   CREATININE 1.32* 1.35* 1.25* [id number]* 0000000*   Metabolic acidosis with AKI: Bicarb improved.   Drug Rash:maculopapular rash generalized appears worse today.  Has been on IV hydrocortisone, changing to prednisone and stopping induction therapy as per infectious disease- completed 25/28 days therapy. Cont Atarax and Benadryl for itching.  Body mass index is 29.76 kg/m.    DVT prophylaxis: SCD/ambulating Code Status: Full code  family Communication: plan of care discussed with patient in detail.  I had offered to call his wife multiple times-he declined. Asked by Rn to call wife- called and udpated today- Wife wanting to talk to ID.   Disposition Plan: Remains inpatient pending improvement in the rash and once okay with infectious disease.    Consultants: Neurosurgery, infectious disease  Procedures: Lumbar puncture  Microbiology: CSF culture no growth to date. COVID-19 -9/28  Antimicrobials: Anti-infectives (From admission, onward)   Start     Dose/Rate Route Frequency Ordered Stop   02/07/19 2230  atovaquone (MEPRON) 750 MG/5ML suspension 750 mg  Status:  Discontinued     750 mg Oral Daily with breakfast 02/07/19 1729 02/10/19 1543   02/07/19 2200  acyclovir (ZOVIRAX) tablet 400 mg     400 mg Oral 2 times daily 02/07/19 1729     02/07/19 1800  flucytosine (ANCOBON) capsule 2,500 mg     25 mg/kg  95.7 kg Oral Every 6 hours 02/07/19 1528     02/07/19 1800  amphotericin B liposome (AMBISOME) 250 mg in dextrose 5 % 500 mL IVPB     3 mg/kg  82.1 kg (Adjusted) 250 mL/hr over 120 Minutes Intravenous Every 24 hours 02/07/19 1528  Objective: Vitals:   03/03/19 0859 03/03/19 1643 03/03/19 2022 03/04/19 0505  BP: 119/77 117/84 114/82 110/74  Pulse: 80 81 (!) 107 88  Resp: 18 18 16 16   Temp: 98 F (36.7 C) 98.4 F (36.9 C) 98.9 F (37.2 C) 98.2 F (36.8 C)  TempSrc: Oral Oral Oral Oral  SpO2: 98% 100% 100%  98%  Weight:      Height:        Intake/Output Summary (Last 24 hours) at 03/04/2019 0908 Last data filed at 03/04/2019 0200 Gross per 24 hour  Intake 2132.07 ml  Output -  Net 2132.07 ml   Filed Weights   02/15/19 0514 02/18/19 0659 02/20/19 0441  Weight: 92.4 kg 92.4 kg 94.1 kg   Weight change:   Body mass index is 29.76 kg/m.  Intake/Output from previous day: 10/22 0701 - 10/23 0700 In: 2372.1 [P.O.:895; IV Piggyback:1477.1] Out: -  Intake/Output this shift: No intake/output data recorded.  Examination: General exam: AAO,NAD, weak/frail. HEENT:Oral mucosa moist, Ear/Nose WNL grossly, dentition normal. Respiratory system: Diminished at the base,no wheezing or crackles, NT,no use of accessory muscle Cardiovascular system: S1 & S2 +, No JVD, regular RR. Gastrointestinal system: Abdomen soft, NT,ND, BS+ Nervous System:Alert, awake, moving extremities and grossly nonfocal Extremities: No edema, distal peripheral pulses palpable.  Skin: No rashes,no icterus.scalp staple out. MSK: Normal muscle bulk,tone, power  Medications:  Scheduled Meds: . acyclovir  400 mg Oral BID  . Chlorhexidine Gluconate Cloth  6 each Topical Daily  . flucytosine  25 mg/kg Oral Q6H  . hydrocortisone sod succinate (SOLU-CORTEF) inj  50 mg Intravenous Daily  . loratadine  10 mg Oral Daily  . pantoprazole  40 mg Oral Q supper  . potassium chloride  40 mEq Oral BID  . sodium chloride  1,000 mL Intravenous Q24H  . sodium chloride  1,000 mL Intravenous Q24H   Continuous Infusions: . sodium chloride Stopped (02/26/19 1500)  . amphotericin  B  Liposome (AMBISOME) ADULT IV 250 mg (03/03/19 1841)  . dextrose Stopped (02/28/19 1811)    Data Reviewed: I have personally reviewed following labs and imaging studies  CBC: Recent Labs  Lab 02/28/19 0324 03/01/19 0500 03/02/19 0347 03/03/19 0411 03/04/19 0424  WBC 9.4 9.1 10.0 10.2 12.3*  HGB 11.8* 11.9* 12.1* 12.0* 12.7*  HCT 36.1* 37.0* 36.7*  36.9* 39.4  MCV 82.2 82.8 81.7 82.2 82.8  PLT 184 208 233 228 [id number]   Basic Metabolic Panel: Recent Labs  Lab 02/28/19 0324 03/01/19 0500 03/02/19 0347 03/03/19 0411 03/04/19 0424  NA 143 141 143 143 142  K 4.3 3.8 3.8 3.9 3.9  CL 115* 110 112* 113* 111  CO2 23 22 22 23 23   GLUCOSE 129* 90 105* 99 108*  BUN 8 9 9 11 11   CREATININE 1.32* 1.35* 1.25* 1.46* 1.39*  CALCIUM 8.6* 8.9 9.0 8.9 9.0  MG 1.9 1.8 1.9 1.9 1.9   GFR: Estimated Creatinine Clearance: 76.5 mL/min (A) (by C-G formula based on SCr of 1.39 mg/dL (H)). Liver Function Tests: No results for input(s): AST, ALT, ALKPHOS, BILITOT, PROT, ALBUMIN in the last 168 hours. No results for input(s): LIPASE, AMYLASE in the last 168 hours. No results for input(s): AMMONIA in the last 168 hours. Coagulation Profile: No results for input(s): INR, PROTIME in the last 168 hours. Cardiac Enzymes: No results for input(s): CKTOTAL, CKMB, CKMBINDEX, TROPONINI in the last 168 hours. BNP (last 3 results) No results for input(s): PROBNP in the last 8760 hours. HbA1C: No  results for input(s): HGBA1C in the last 72 hours. CBG: No results for input(s): GLUCAP in the last 168 hours. Lipid Profile: No results for input(s): CHOL, HDL, LDLCALC, TRIG, CHOLHDL, LDLDIRECT in the last 72 hours. Thyroid Function Tests: No results for input(s): TSH, T4TOTAL, FREET4, T3FREE, THYROIDAB in the last 72 hours. Anemia Panel: No results for input(s): VITAMINB12, FOLATE, FERRITIN, TIBC, IRON, RETICCTPCT in the last 72 hours. Sepsis Labs: No results for input(s): PROCALCITON, LATICACIDVEN in the last 168 hours.  No results found for this or any previous visit (from the past 240 hour(s)).    Radiology Studies: No results found.    LOS: 25 days   Time spent: More than 50% of that time was spent in counseling and/or coordination of care.  Antonieta Pert, MD [facility]  03/04/2019, 9:08 AM

## 2019-03-04 NOTE — Progress Notes (Signed)
Spoke with infectious disease regarding patients rash, they may d/c some medications, holding AM ancobon dose until they round.

## 2019-03-05 DIAGNOSIS — N179 Acute kidney failure, unspecified: Secondary | ICD-10-CM

## 2019-03-05 LAB — BASIC METABOLIC PANEL
Anion gap: 9 (ref 5–15)
BUN: 16 mg/dL (ref 6–20)
CO2: 23 mmol/L (ref 22–32)
Calcium: 9.3 mg/dL (ref 8.9–10.3)
Chloride: 112 mmol/L — ABNORMAL HIGH (ref 98–111)
Creatinine, Ser: 1.87 mg/dL — ABNORMAL HIGH (ref 0.61–1.24)
GFR calc Af Amer: 49 mL/min — ABNORMAL LOW (ref >60)
GFR calc non Af Amer: 42 mL/min — ABNORMAL LOW (ref >60)
Glucose, Bld: 129 mg/dL — ABNORMAL HIGH (ref 70–99)
Potassium: 4.6 mmol/L (ref 3.5–5.1)
Sodium: 144 mmol/L (ref 135–145)

## 2019-03-05 LAB — CBC
HCT: 40.8 % (ref 39.0–52.0)
Hemoglobin: 13.4 g/dL (ref 13.0–17.0)
MCH: 26.9 pg (ref 26.0–34.0)
MCHC: 32.8 g/dL (ref 30.0–36.0)
MCV: 81.8 fL (ref 80.0–100.0)
Platelets: 278 10*3/uL (ref 150–400)
RBC: 4.99 MIL/uL (ref 4.22–5.81)
RDW: 13.7 % (ref 11.5–15.5)
WBC: 17 10*3/uL — ABNORMAL HIGH (ref 4.0–10.5)
nRBC: 0 % (ref 0.0–0.2)

## 2019-03-05 LAB — MAGNESIUM: Magnesium: 2 mg/dL (ref 1.7–2.4)

## 2019-03-05 MED ORDER — SODIUM CHLORIDE 0.9 % IV SOLN
INTRAVENOUS | Status: AC | PRN
  Administered 2019-03-05: 23:00:00 via INTRAVENOUS

## 2019-03-05 NOTE — Progress Notes (Signed)
    [facility] for Infectious Disease    Date of Admission:  02/07/2019   Total days of antibiotics 27/ fluc 1          ID: David Whitehead is a 48 y.o. male with  CLL admitted for CM Principal Problem:   Cryptococcal meningitis ([place]) Active Problems:   CLL (chronic lymphocytic leukemia) ([place])   Pulmonary nodule   Hypertension   Brain lesion   Meningitis, cryptococcal (HCC)    Subjective: Afebrile, rash improved somewhat today on torso. Labs reveal aki  Medications:  . acyclovir  400 mg Oral BID  . Chlorhexidine Gluconate Cloth  6 each Topical Daily  . fluconazole  400 mg Oral Daily  . hydrOXYzine  50 mg Oral TID  . loratadine  10 mg Oral Daily  . pantoprazole  40 mg Oral Q supper  . predniSONE  40 mg Oral Daily    Objective: Vital signs in last 24 hours: Temp:  [97.8 F (36.6 C)-98.3 F (36.8 C)] 98.1 F (36.7 C) (10/24 0852) Pulse Rate:  [77-93] 89 (10/24 0852) Resp:  [14-18] 18 (10/24 0852) BP: (109-115)/(74-80) 115/80 (10/24 0852) SpO2:  [96 %-97 %] 96 % (10/24 [id number]) Physical Exam  Constitutional: He is oriented to person, place, and time. He appears well-developed and well-nourished. No distress.  HENT: well healed incision for vp shunt Mouth/Throat: Oropharynx is clear and moist. No oropharyngeal exudate.  Cardiovascular: Normal rate, regular rhythm and normal heart sounds. Exam reveals no gallop and no friction rub.  No murmur heard.  Pulmonary/Chest: Effort normal and breath sounds normal. No respiratory distress. He has no wheezes.  Abdominal: Soft. Bowel sounds are normal. He exhibits no distension. There is no tenderness.  Neurological: He is alert and oriented to person, place, and time.  Skin: maculopapular rash improved at back but abdomen and extremities still present Psychiatric: He has a normal mood and affect. His behavior is normal.    Lab Results Recent Labs    03/04/19 0424 03/05/19 0408  WBC 12.3* 17.0*  HGB 12.7* 13.4  HCT 39.4  40.8  NA 142 144  K 3.9 4.6  CL 111 112*  CO2 23 23  BUN 11 16  CREATININE 1.39* 1.87*   Liver Panel Recent Labs    03/04/19 0424  PROT 5.2*  ALBUMIN 3.6  AST 18  ALT 26  ALKPHOS 54  BILITOT 0.4  BILIDIR 0.2  IBILI 0.2*    Microbiology: reviewed Studies/Results: No results found.   Assessment/Plan: Cryptococcal meningitis = please discharge on fluconazole 400mg  daily for 2 months worth. Will arrange for weekly labs and follow up visit in the id clinic  aki = likely residual impact from taking ampho. Will give 158mL/hr for next 10-12hr to see improved. Cr today at 1.87. Repeat bmp in the am  Drug related maculopapular rash = continue with one more day of pred 40mg  in the am  Can d/c in the morning if aki improved  [facility] for Infectious Diseases Cell: [phone] Pager: [phone]  03/05/2019, 4:47 PM

## 2019-03-05 NOTE — Plan of Care (Signed)
  Problem: Health Behavior/Discharge Planning: Goal: Ability to manage health-related needs will improve Outcome: Progressing   

## 2019-03-05 NOTE — Progress Notes (Signed)
PROGRESS NOTE  David Whitehead [id number] DOB: 1970/09/26 DOA: 02/07/2019 PCP: [name], MD  Brief Narrative: 48 year old male with history of chronic lymphocytic leukemia, hypertension diabetes type 2 diabetes mellitus headache diplopia who is diagnosed with cryptococcal meningitis and started on antibiotics he was started on amphotericin B induction therapy and developed a drug rash reaction     Interval history/Subjective:  Patient was seen and examined at bedside he denies any fever neck pain rash is much improved but he still has quite a lot of rash especially his trunk and back and abdomen.  He is on prednisone which he said is helping  Assessment/Plan Cryptococcal meningitis patient failed induction therapy with amphotericin B due to drug reaction.  Patient will continue on Keppra and acyclovir as well as Diflucan  2.  CCL by history chemotherapy is on hold  3.  Hypertension is controlled patient not on med  4.  AKI mild continue fluid  5.  Drug reaction continue prednisone    DVT prophylaxis: SCD Code Status: Full Family Communication: With patient Disposition Plan: Home when improvements occurs and per infectious disease  Dr. Kyung Bacca [facility] Pager [phone]  03/05/2019, 9:00 AM  LOS: 26 days   Consultants: Infectious disease Neurosurgery Procedures:  Lumbar puncture  Antimicrobials:  Diflucan  Acyclovir   Objective: Vitals:  Vitals:   03/05/19 0512 03/05/19 0852  BP: 109/74 115/80  Pulse: 77 89  Resp: 18 18  Temp: 97.8 F (36.6 C) 98.1 F (36.7 C)  SpO2: 97% 96%    Exam:  Constitutional:  . Appears calm and comfortable slightly overweight Eyes:  . pupils and irises appear normal . Normal lids and conjunctivae ENMT:  . grossly normal hearing  . Lips appear normal . external ears, nose appear normal . Oropharynx: mucosa, tongue,posterior pharynx appear normal Neck:  . neck appears normal, no masses, normal ROM,  supple . no thyromegaly Respiratory:  . CTA bilaterally, no w/r/r.  . Respiratory effort normal. No retractions or accessory muscle use Cardiovascular:  . RRR, no m/r/g . No LE extremity edema   . Normal pedal pulses Abdomen:  . Abdomen appears normal; no tenderness or masses . No hernias . No HSM Musculoskeletal:  . Digits/nails BUE: no clubbing, cyanosis, petechiae, infection . exam of joints, bones, muscles of at least one of following: head/neck, RUE, LUE, RLE, LLE   o strength and tone normal, no atrophy, no abnormal movements o No tenderness, masses o Normal ROM, no contractures  . gait and station Skin:  . Maculopapular erythematous rash generalized slightly improved in his upper extremity but still quite significant in the trunk and abdomen. . palpation of skin: no induration or nodules Neurologic:  . CN 2-12 intact . Sensation all 4 extremities intact Psychiatric:  . Mental status o Mood, affect appropriate o Orientation to person, place, time  . judgment and insight appear intact    I have personally reviewed the following:   Data:  Scheduled Meds: . acyclovir  400 mg Oral BID  . Chlorhexidine Gluconate Cloth  6 each Topical Daily  . fluconazole  400 mg Oral Daily  . hydrOXYzine  50 mg Oral TID  . loratadine  10 mg Oral Daily  . pantoprazole  40 mg Oral Q supper  . predniSONE  40 mg Oral Daily   Continuous Infusions: . sodium chloride Stopped (02/26/19 1500)  . dextrose Stopped (02/28/19 1811)    Principal Problem:   Cryptococcal meningitis (HCC) Active Problems:   CLL (chronic  lymphocytic leukemia) ([place])   Pulmonary nodule   Hypertension   Brain lesion   Meningitis, cryptococcal ([place])   LOS: 26 days

## 2019-03-06 LAB — CBC
HCT: 36.8 % — ABNORMAL LOW (ref 39.0–52.0)
Hemoglobin: 12 g/dL — ABNORMAL LOW (ref 13.0–17.0)
MCH: 26.7 pg (ref 26.0–34.0)
MCHC: 32.6 g/dL (ref 30.0–36.0)
MCV: 81.8 fL (ref 80.0–100.0)
Platelets: 245 10*3/uL (ref 150–400)
RBC: 4.5 MIL/uL (ref 4.22–5.81)
RDW: 13.9 % (ref 11.5–15.5)
WBC: 17.8 10*3/uL — ABNORMAL HIGH (ref 4.0–10.5)
nRBC: 0 % (ref 0.0–0.2)

## 2019-03-06 LAB — MAGNESIUM: Magnesium: 2 mg/dL (ref 1.7–2.4)

## 2019-03-06 LAB — BASIC METABOLIC PANEL
Anion gap: 10 (ref 5–15)
BUN: 23 mg/dL — ABNORMAL HIGH (ref 6–20)
CO2: 22 mmol/L (ref 22–32)
Calcium: 8.8 mg/dL — ABNORMAL LOW (ref 8.9–10.3)
Chloride: 112 mmol/L — ABNORMAL HIGH (ref 98–111)
Creatinine, Ser: 1.95 mg/dL — ABNORMAL HIGH (ref 0.61–1.24)
GFR calc Af Amer: 46 mL/min — ABNORMAL LOW (ref >60)
GFR calc non Af Amer: 40 mL/min — ABNORMAL LOW (ref >60)
Glucose, Bld: 117 mg/dL — ABNORMAL HIGH (ref 70–99)
Potassium: 4 mmol/L (ref 3.5–5.1)
Sodium: 144 mmol/L (ref 135–145)

## 2019-03-06 LAB — CREATININE, SERUM
Creatinine, Ser: 1.94 mg/dL — ABNORMAL HIGH (ref 0.61–1.24)
GFR calc Af Amer: 47 mL/min — ABNORMAL LOW (ref >60)
GFR calc non Af Amer: 40 mL/min — ABNORMAL LOW (ref >60)

## 2019-03-06 MED ORDER — PANTOPRAZOLE SODIUM 40 MG PO TBEC: 40.0000 mg | DELAYED_RELEASE_TABLET | Freq: Every day | ORAL | 0 refills | Status: DC

## 2019-03-06 MED ORDER — SODIUM CHLORIDE 0.9 % IV BOLUS
500.0000 mL | Freq: Once | INTRAVENOUS | Status: AC
  Administered 2019-03-06: 500 mL via INTRAVENOUS

## 2019-03-06 MED ORDER — HYDROXYZINE HCL 50 MG PO TABS: 50.0000 mg | ORAL_TABLET | Freq: Three times a day (TID) | ORAL | 0 refills | Status: DC

## 2019-03-06 MED ORDER — ONDANSETRON HCL 4 MG PO TABS: 4.0000 mg | ORAL_TABLET | ORAL | 0 refills | Status: DC | PRN

## 2019-03-06 MED ORDER — FLUCONAZOLE 200 MG PO TABS: 400.0000 mg | ORAL_TABLET | Freq: Every day | ORAL | 0 refills | Status: DC

## 2019-03-06 NOTE — Plan of Care (Signed)
  Problem: Health Behavior/Discharge Planning: Goal: Ability to manage health-related needs will improve Outcome: Adequate for Discharge   Problem: Clinical Measurements: Goal: Ability to maintain clinical measurements within normal limits will improve 03/06/2019 1749 by Baldo Ash, RN Outcome: Adequate for Discharge 03/06/2019 0824 by Baldo Ash, RN Outcome: Progressing Goal: Will remain free from infection Outcome: Adequate for Discharge Goal: Diagnostic test results will improve Outcome: Adequate for Discharge Goal: Respiratory complications will improve Outcome: Adequate for Discharge Goal: Cardiovascular complication will be avoided Outcome: Adequate for Discharge   Problem: Education: Goal: Knowledge of General Education information will improve Description: Including pain rating scale, medication(s)/side effects and non-pharmacologic comfort measures Outcome: Adequate for Discharge   Problem: Health Behavior/Discharge Planning: Goal: Ability to manage health-related needs will improve Outcome: Adequate for Discharge   Problem: Clinical Measurements: Goal: Ability to maintain clinical measurements within normal limits will improve Outcome: Adequate for Discharge Goal: Will remain free from infection Outcome: Adequate for Discharge Goal: Diagnostic test results will improve Outcome: Adequate for Discharge Goal: Respiratory complications will improve Outcome: Adequate for Discharge Goal: Cardiovascular complication will be avoided Outcome: Adequate for Discharge   Problem: Coping: Goal: Level of anxiety will decrease Outcome: Adequate for Discharge   Problem: Elimination: Goal: Will not experience complications related to bowel motility Outcome: Adequate for Discharge Goal: Will not experience complications related to urinary retention Outcome: Adequate for Discharge   Problem: Education: Goal: Knowledge of General Education information will  improve Description: Including pain rating scale, medication(s)/side effects and non-pharmacologic comfort measures Outcome: Adequate for Discharge   Problem: Health Behavior/Discharge Planning: Goal: Ability to manage health-related needs will improve Outcome: Adequate for Discharge   Problem: Clinical Measurements: Goal: Ability to maintain clinical measurements within normal limits will improve Outcome: Adequate for Discharge Goal: Will remain free from infection Outcome: Adequate for Discharge Goal: Diagnostic test results will improve Outcome: Adequate for Discharge Goal: Respiratory complications will improve Outcome: Adequate for Discharge Goal: Cardiovascular complication will be avoided Outcome: Adequate for Discharge   Problem: Activity: Goal: Risk for activity intolerance will decrease Outcome: Adequate for Discharge   Problem: Nutrition: Goal: Adequate nutrition will be maintained Outcome: Adequate for Discharge   Problem: Coping: Goal: Level of anxiety will decrease Outcome: Adequate for Discharge   Problem: Elimination: Goal: Will not experience complications related to bowel motility Outcome: Adequate for Discharge Goal: Will not experience complications related to urinary retention Outcome: Adequate for Discharge   Problem: Pain Managment: Goal: General experience of comfort will improve Outcome: Adequate for Discharge   Problem: Skin Integrity: Goal: Risk for impaired skin integrity will decrease Outcome: Adequate for Discharge

## 2019-03-06 NOTE — Discharge Summary (Signed)
Discharge Summary  David Whitehead [id number] DOB: January 31, 1971  PCP: [name], MD  Admit date: 02/07/2019 Discharge date: 03/06/2019  Time spent: 35 minutes  Recommendations for Outpatient Follow-up:  1. Oncology 2. Infectious disease 3. Primary care provider 4. 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   Brain lesion    Hypertension    Pulmonary nodule 08/25/2018   CLL (chronic lymphocytic leukemia) ([place]) 04/01/2018    Resolved Hospital Problems   Diagnosis Date Noted Date Resolved   Meningitis  02/12/2019    Discharge Condition: Improved  Diet recommendation: Regular  Vitals:   03/06/19 0535 03/06/19 0849  BP: 122/80 119/90  Pulse: 73 87  Resp: 16 18  Temp: 98 F (36.7 C) 98.6 F (37 C)  SpO2: 99% 100%    History of present illness:   48 year old male with history of chronic lymphocytic leukemia, hypertension diabetes type 2 diabetes mellitus headache diplopia who is diagnosed with cryptococcal meningitis and started on antibiotics he was started on amphotericin B induction therapy and developed a drug rash reaction   Hospital Course:  Principal Problem:   Cryptococcal meningitis ([place]) Active Problems:   CLL (chronic lymphocytic leukemia) ([facility])   Pulmonary nodule   Hypertension   Brain lesion   Meningitis, cryptococcal ([place])  48 year old male with history of chronic lymphocytic leukemia, hypertension diabetes type 2 diabetes mellitus headache diplopia who is diagnosed with cryptococcal meningitis and started on antibiotics he was started on amphotericin B induction therapy and developed a drug rash reaction Cryptococcal meningitis patient failed induction therapy with amphotericin B due to drug reaction.  Patient will continue on Keppra and acyclovir as well  as Diflucan  2.  CCL by history chemotherapy is on hold  3.  Hypertension is controlled patient not on med  4.  AKI mild he was given a bolus of normal saline 500 fluid due to a creatinine going up to 1.9 today.  He is encouraged to increase fluid as outpatient and have his BUN/creatinine rechecked in 1 to 2 days as outpatient  5.  Drug reaction treated with prednisone    Procedures:  None  Consultations:  Infectious disease  Discharge Exam: BP 119/90 (BP Location: Right Arm)    Pulse 87    Temp 98.6 F (37 C) (Oral)    Resp 18    Ht 5\' 10"  (1.778 m)    Wt 94.1 kg    SpO2 100%    BMI 29.76 kg/m   General: Obese alert oriented x3 Cardiovascular: Regular rate and rhythm Respiratory: Effort normal, CTA bilaterally Skin erythematous papular rash improved still some in the trunk area but extremities are clearing very well  Discharge Instructions You were cared for by a hospitalist during your hospital stay. If you have any questions about your discharge medications or the care you received while you were in the hospital after you are discharged, you can call the unit and asked to speak with the hospitalist on call if the hospitalist that took care of you is not available. Once you are discharged, your primary care physician will handle any further medical issues. Please note that NO REFILLS for any discharge medications will be authorized once you are discharged, as it is imperative that you return to your primary care physician (or establish a relationship with a primary care physician if you  do not have one) for your aftercare needs so that they can reassess your need for medications and monitor your lab values.  Discharge Instructions    Call MD for:  difficulty breathing, headache or visual disturbances   Complete by: As directed    Call MD for:  persistant nausea and vomiting   Complete by: As directed    Call MD for:  temperature >100.4   Complete by: As directed     Diet - low sodium heart healthy   Complete by: As directed    Discharge instructions   Complete by: As directed    Up oncology and infectious disease as outpatient.  Need to repeat CBC and CHEM 12 particularly creatinine in the next 3 days this can be done by the oncologist or infectious disease or primary care   Increase activity slowly   Complete by: As directed      Allergies as of 03/06/2019      Reactions   Bactrim [sulfamethoxazole-trimethoprim] Rash   Latex Other (See Comments)   Took skin off when removed tape   Amoxicillin Rash   Has patient had a PCN reaction causing immediate rash, facial/tongue/throat swelling, SOB or lightheadedness with hypotension: No Has patient had a PCN reaction causing severe rash involving mucus membranes or skin necrosis: No Has patient had a PCN reaction that required hospitalization: No Has patient had a PCN reaction occurring within the last 10 years: No If all of the above answers are "NO", then may proceed with Cephalosporin use.      Medication List    TAKE these medications   acetaminophen 500 MG tablet Commonly known as: TYLENOL Take 500 mg by mouth every 6 (six) hours as needed for mild pain.   acyclovir 400 MG tablet Commonly known as: ZOVIRAX Take 400 mg by mouth 2 (two) times daily.   atovaquone 750 MG/5ML suspension Commonly known as: MEPRON Take 750 mg by mouth daily with breakfast.   cetirizine 10 MG tablet Commonly known as: ZYRTEC Take 10 mg by mouth daily.   fluconazole 200 MG tablet Commonly known as: DIFLUCAN Take 2 tablets (400 mg total) by mouth daily. Start taking on: March 07, 2019   hydrOXYzine 50 MG tablet Commonly known as: ATARAX/VISTARIL Take 1 tablet (50 mg total) by mouth 3 (three) times daily. What changed:   medication strength  how much to take  when to take this  reasons to take this   Imbruvica 420 MG Tabs Generic drug: Ibrutinib Take by mouth daily.   meloxicam 15 MG  tablet Commonly known as: MOBIC Take 1 tablet (15 mg total) by mouth daily.   ondansetron 4 MG tablet Commonly known as: ZOFRAN Take 1 tablet (4 mg total) by mouth every 4 (four) hours as needed for nausea or vomiting.   pantoprazole 40 MG tablet Commonly known as: PROTONIX Take 1 tablet (40 mg total) by mouth daily.      Allergies  Allergen Reactions   Bactrim [Sulfamethoxazole-Trimethoprim] Rash   Latex Other (See Comments)    Took skin off when removed tape   Amoxicillin Rash    Has patient had a PCN reaction causing immediate rash, facial/tongue/throat swelling, SOB or lightheadedness with hypotension: No Has patient had a PCN reaction causing severe rash involving mucus membranes or skin necrosis: No Has patient had a PCN reaction that required hospitalization: No Has patient had a PCN reaction occurring within the last 10 years: No If all of the above answers are "NO",  then may proceed with Cephalosporin use.       The results of significant diagnostics from this hospitalization (including imaging, microbiology, ancillary and laboratory) are listed below for reference.    Significant Diagnostic Studies: Ct Head Wo Contrast  Result Date: 02/18/2019 CLINICAL DATA:  Patient for VP shunt in OR found to have some blood mixed with CSF, rule out intracranial hemorrhage. EXAM: CT HEAD WITHOUT CONTRAST TECHNIQUE: Contiguous axial images were obtained from the base of the skull through the vertex without intravenous contrast. COMPARISON:  Brain MRI 02/16/2019, head CT 02/16/2019. FINDINGS: Brain: There are external components of a new right-sided shunt system extending to a right parietal burr hole. There are multiple small foci of gas within the adjacent high right frontal lobe, however, no intracranial ventricular catheter is identified. The ventricular system is unchanged in size and configuration as compared to prior MR and CT 02/16/2019 without overt hydrocephalus. No  significant acute intracranial hemorrhage. Grossly unchanged region of parenchymal hypodensity within the anterosuperior right frontal lobe extending to involve the anterior body/genu of the corpus callosum. 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 No midline shift or extra-axial fluid collection. Vascular: No hyperdense vessel. Skull: No calvarial fracture or suspicious osseous lesion. Right parietal burr hole. Sinuses/Orbits: Visualized orbits demonstrate no acute abnormality. Mild to moderate ethmoid sinus mucosal thickening. Scattered mild mucosal thickening within remaining paranasal sinuses. No significant mastoid effusion Findings within impression 1 called by telephone at the time of interpretation on 02/18/2019 at 12:46 pm to provider KYLE CABBELL , who verbally acknowledged these results. Per Dr. Christella Noa, the intracranial component of the shunt was not left in place while in the OR, and would not be expected to be seen. IMPRESSION: 1. The external components of a new right-sided shunt system extend to a right parietal burr hole. There are multiple small foci of gas within the adjacent high right frontal lobe, although no intracranial ventricular catheter is present. No significant acute intracranial hemorrhage. No overt hydrocephalus. 2. Grossly unchanged region of hypodensity within the anterosuperior right frontal lobe and callosal anterior body/genu consistent with previously demonstrated cerebritis. Electronically Signed   By: Kellie Simmering   On: 02/18/2019 12:51   Ct Head Wo Contrast  Result Date: 02/16/2019 CLINICAL DATA:  48 year old male with history of CLL and recent cryptococcal meningitis, elevated CSF opening pressure on multiple recent lumbar punctures. EXAM: CT HEAD WITHOUT CONTRAST TECHNIQUE: Contiguous axial images were obtained from the base of the skull through the vertex without intravenous contrast. COMPARISON:  Head CT 01/25/2019. Brain MRI 01/31/2019.  FINDINGS: Brain: There is a new roughly 3 centimeter area of abnormal hypodensity in the anterior right frontal lobe seen on series 3, image 25 and coronal image 20. This might reflect vasogenic edema around a smaller isodense lesion on series 3, image 23. No significant mass effect. No associated hemorrhage. Gray-white matter differentiation elsewhere appears stable and within normal limits. No ventriculomegaly. No cortically based acute infarct identified. Vascular: No suspicious intracranial vascular hyperdensity. Skull: No acute osseous abnormality identified. Sinuses/Orbits: Visualized paranasal sinuses and mastoids are stable and well pneumatized. Other: No acute orbit or scalp soft tissue findings. There are bilateral level 5 lymph nodes mildly increased in size and number on series 4, image 6, up to 8 millimeter short axis. IMPRESSION: 1. New approximately 3 cm hypodense area in the anterior right frontal lobe. This is suspicious for edema and might reflect acute cerebritis in this setting. Recommend repeat Brain MRI without and with  contrast to further characterize. 2. No other acute intracranial abnormality. 3. Mildly increased size and number of level 5 lymph nodes compatible with CLL. Electronically Signed   By: Genevie Ann M.D.   On: 02/16/2019 12:41   Mr Jeri Cos [id number] Contrast  Result Date: 02/16/2019 CLINICAL DATA:  Meningitis due to other and unspecified causes. EXAM: MRI HEAD WITHOUT AND WITH CONTRAST TECHNIQUE: Multiplanar, multiecho pulse sequences of the brain and surrounding structures were obtained without and with intravenous contrast. CONTRAST:  17mL GADAVIST GADOBUTROL 1 MMOL/ML IV SOLN COMPARISON:  Head CT 02/16/2019, brain MRI 01/31/2019 FINDINGS: Brain: New from prior MRI 01/31/2019, there is significant supratentorial and infratentorial leptomeningeal enhancement, most prominent along the anterosuperior interhemispheric fissure, and consistent with the provided history of meningitis. Also  new from prior MRI, within the paramedian anterosuperior right frontal lobe there is prominent cortical/subcortical edema which extends into the anterior body/genu of the corpus callosum. Associated irregular foci of restricted diffusion within the region of right frontal lobe parenchymal signal abnormality. The constellation of findings are consistent with cerebritis. No formed parenchymal abscess on the current examination. Of note, abnormal leptomeningeal enhancement extends into the left internal auditory canal (series 10, image 13). No midline shift, hydrocephalus or extra-axial fluid collection. No chronic intracranial blood products. Additional mild scattered foci of T2/FLAIR hyperintensity within the cerebral white matter are nonspecific, but may reflect early chronic small vessel ischemic disease. Vascular: Flow voids maintained within the proximal large arterial vessels. Expected enhancement within the dural venous sinuses. Skull and upper cervical spine: No focal marrow lesion. Sinuses/Orbits: Visualized orbits demonstrate no acute abnormality. Mild ethmoid sinus mucosal thickening. No significant mastoid effusion. Other: Redemonstrated prominent lymph nodes within the upper neck consistent with the provided history of CLL. These results will be called to the ordering clinician or representative by the Radiologist Assistant, and communication documented in the PACS or zVision Dashboard. IMPRESSION: 1. Abnormal leptomeningeal enhancement new from prior MRI 01/31/2019 and consistent with the provided history of meningitis. Enhancement is most prominent along the anterosuperior interhemispheric fissure. Also of note, abnormal leptomeningeal enhancement extends into the left IAC. 2. 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 Associated irregular restricted diffusion within the region of right frontal lobe  parenchymal signal abnormality. No formed abscess on the current study. 3. No midline shift, hydrocephalus or extra-axial fluid collection. Electronically Signed   By: Kellie Simmering   On: 02/16/2019 23:24   [place] Ekg Site Rite  Result Date: 02/08/2019 If Site Rite image not attached, placement could not be confirmed due to current cardiac rhythm.  Dg Fl Guided Lumbar Puncture  Result Date: 02/12/2019 CLINICAL DATA:  Cryptococcal meningitis. EXAM: DIAGNOSTIC LUMBAR PUNCTURE UNDER FLUOROSCOPIC GUIDANCE FLUOROSCOPY TIME:  Fluoroscopy Time:  0.1 minute Radiation Exposure Index (if provided by the fluoroscopic device): 0.4 mGy Number of Acquired Spot Images: 0 PROCEDURE: Informed consent was obtained from the patient prior to the procedure, including potential complications of headache, allergy, and pain. With the patient prone, the lower back was prepped with Betadine. 1% Lidocaine was used for local anesthesia. Lumbar puncture was performed at the L4-5 level using a 22 gauge needle with return of yellow CSF with an opening pressure of 29 cm water. 13 ml of CSF were obtained for laboratory studies. Closing pressure was 12 cm of water. The patient tolerated the procedure well and there were no apparent complications. IMPRESSION: Successful fluoroscopic guided lumbar puncture. Electronically Signed  By: Kathreen Devoid   On: 02/12/2019 11:58   Dg Fl Guided Lumbar Puncture  Result Date: 02/11/2019 CLINICAL DATA:  Cryptococcal meningitis EXAM: DIAGNOSTIC LUMBAR PUNCTURE UNDER FLUOROSCOPIC GUIDANCE FLUOROSCOPY TIME:  Fluoroscopy Time:  1 minutes Radiation Exposure Index (if provided by the fluoroscopic device): 6.2 mGy Number of Acquired Spot Images: 0 PROCEDURE: Informed consent was obtained from the patient prior to the procedure, including potential complications of headache, allergy, and pain. With the patient prone, the lower back was prepped with Betadine. 1% Lidocaine was used for local anesthesia. Lumbar  puncture was performed at the L3-4 level using a 22 gauge needle with return of clear CSF with an opening pressure of 28 cm water. Twenty-one ml of CSF were obtained for laboratory studies. The patient tolerated the procedure well and there were no apparent complications. IMPRESSION: 1. Lumbar puncture with removal of 21 cc of clear CSF. Opening pressure was equal to 28 cm of water. Electronically Signed   By: Kerby Moors M.D.   On: 02/11/2019 11:47   Dg Fl Guided Lumbar Puncture  Result Date: 02/09/2019 CLINICAL DATA:  48 year old male with history of meningitis. EXAM: DIAGNOSTIC LUMBAR PUNCTURE UNDER FLUOROSCOPIC GUIDANCE FLUOROSCOPY TIME:  Fluoroscopy Time:  24 seconds Radiation Exposure Index (if provided by the fluoroscopic device): 2.7 mGy PROCEDURE: Informed consent was obtained from the patient prior to the procedure, including potential complications of headache, allergy, and pain. With the patient prone, the lower back was prepped with Betadine. 1% Lidocaine was used for local anesthesia. Lumbar puncture was performed at the L3-L4 level using a 20 gauge needle with return of clear CSF with an opening pressure of 28 cm water. 14 ml of CSF were obtained for laboratory studies. The patient tolerated the procedure well and there were no apparent complications. IMPRESSION: 1. Successful uncomplicated fluoroscopic guided lumbar puncture, as above. Electronically Signed   By: Vinnie Langton M.D.   On: 02/09/2019 11:52    Microbiology: No results found for this or any previous visit (from the past 240 hour(s)).   Labs: Basic Metabolic Panel: Recent Labs  Lab 03/02/19 0347 03/03/19 0411 03/04/19 0424 03/05/19 0408 03/06/19 0344  NA 143 143 142 144 144  K 3.8 3.9 3.9 4.6 4.0  CL 112* 113* 111 112* 112*  CO2 22 23 23 23 22   GLUCOSE 105* 99 108* 129* 117*  BUN 9 11 11 16  23*  CREATININE 1.25* 1.46* 1.39* 1.87* 1.95*  CALCIUM 9.0 8.9 9.0 9.3 8.8*  MG 1.9 1.9 1.9 2.0 2.0   Liver  Function Tests: Recent Labs  Lab 03/04/19 0424  AST 18  ALT 26  ALKPHOS 54  BILITOT 0.4  PROT 5.2*  ALBUMIN 3.6   No results for input(s): LIPASE, AMYLASE in the last 168 hours. No results for input(s): AMMONIA in the last 168 hours. CBC: Recent Labs  Lab 03/02/19 0347 03/03/19 0411 03/04/19 0424 03/05/19 0408 03/06/19 0344  WBC 10.0 10.2 12.3* 17.0* 17.8*  HGB 12.1* 12.0* 12.7* 13.4 12.0*  HCT 36.7* 36.9* 39.4 40.8 36.8*  MCV 81.7 82.2 82.8 81.8 81.8  PLT 233 228 241 278 245   Cardiac Enzymes: No results for input(s): CKTOTAL, CKMB, CKMBINDEX, TROPONINI in the last 168 hours. BNP: BNP (last 3 results) No results for input(s): BNP in the last 8760 hours.  ProBNP (last 3 results) No results for input(s): PROBNP in the last 8760 hours.  CBG: No results for input(s): GLUCAP in the last 168 hours.  Signed:  Cristal Deer, MD [facility] 03/06/2019, 4:35 PM

## 2019-03-06 NOTE — Progress Notes (Signed)
ID PROGRESS NOTE  48yo M with CLL with cryptococcal meningitis s/p VP shunt, developed drug related rash to ampho-flucytosine which was stopped day 25 of 28 course.  Now on fluconazole 400mg . Developed mild AKI presumed to ampho  CM = continue on fluconazole 400mg  daily x 2 months, will check CMP in 1-2 wk to see that not developing transaminitis  aki = gave IVF but still elevated above baseline, today at 1.9 from baseline of 1.3. We will recommend good oral hydration, will check cr in 2-3 days to ensure it is back to baseline.   CLL= spoke with oncologist to hold previous treatment that we suspect contributed to risk of CM  Cleared for discharge from ID standpoint Will arrange follow up in the ID clinic in 2-4 wk  Kester Stimpson B. [facility] Infectious [facility] [phone]

## 2019-03-06 NOTE — Progress Notes (Signed)
PICC line removed per order and line is intact. Vaseline gauze and gauze dressing applied and pressure held. Site clean, dry, and intact. Patient and RN aware that patient is on bedrest for 30 minutes. Patient aware to leave dressing on and dry for 24 hours.  

## 2019-03-06 NOTE — Plan of Care (Signed)
  Problem: Clinical Measurements: Goal: Ability to maintain clinical measurements within normal limits will improve Outcome: Progressing   

## 2019-03-06 NOTE — Addendum Note (Signed)
Encounter addended by: Baldo Ash, RN on: 03/06/2019 6:18 PM  Actions taken: Clinical Note Signed

## 2019-03-06 NOTE — Progress Notes (Signed)
David Whitehead to be discharged home per MD order. Discussed prescriptions and follow up appointments with the patient. Prescriptions given to patient; medication list explained in detail. Patient verbalized understanding.  Skin clean, dry and intact without evidence of skin break down, no evidence of skin tears noted. IV catheter discontinued intact. Site without signs and symptoms of complications. Dressing and pressure applied. Pt denies pain at the site currently. No complaints noted.  Patient free of lines, drains, and wounds.   An After Visit Summary (AVS) was printed and given to the patient. Patient escorted via wheelchair, and discharged home via private auto.  Baldo Ash, RN

## 2019-03-07 ENCOUNTER — Other Ambulatory Visit: Payer: Self-pay

## 2019-03-07 ENCOUNTER — Telehealth: Payer: Self-pay | Admitting: Family Medicine

## 2019-03-07 ENCOUNTER — Encounter: Payer: Self-pay | Admitting: *Deleted

## 2019-03-07 DIAGNOSIS — G039 Meningitis, unspecified: Secondary | ICD-10-CM

## 2019-03-07 NOTE — Patient Outreach (Signed)
[name]) Care Management  03/07/2019  David Whitehead April 20, 1971 [id number]   Social work referral received from [facility] [place].  "Please assign patient to social worker for post hospital follow up for resources and assistance navigating help for applying for Disability with CLL, hospital referral request." Successful outreach to patient today.  Patient stated that he is not going to be able to work due to all of his health conditions.  Informed patient of online application process for disability.  Encouraged patient to contact local [employer] if additional assistance is needed with application process; provided him with contact information.  They are seeing individuals in person by appointment only.  Inquired about need for other resources, transportation, financial, food.  Patient denied need for these resources at this time. Closing case but did encourage him to call if additional needs arise.  Ronn Melena, BSW Social Worker [phone]

## 2019-03-07 NOTE — Telephone Encounter (Signed)
Blood work ordered in [employer] and patient notified via my chart.

## 2019-03-07 NOTE — Addendum Note (Signed)
Addended by: Dairl Ponder on: 03/07/2019 04:11 PM   Modules accepted: Orders

## 2019-03-07 NOTE — Telephone Encounter (Signed)
Nurses Patient was just released from hospital on Sunday He needs to do lab work Monday or Tuesday Please put in for CBC and comprehensive metabolic profile ( CMP 12) Reason cryptococcal meningitis Renal insufficiency  Please send patient a MyChart message letting him know lab orders were put in for [place] The patient should go on Monday or Tuesday to get this completed

## 2019-03-08 DIAGNOSIS — G039 Meningitis, unspecified: Secondary | ICD-10-CM | POA: Diagnosis not present

## 2019-03-09 LAB — CBC WITH DIFFERENTIAL/PLATELET
Basophils Absolute: 0.3 10*3/uL — ABNORMAL HIGH (ref 0.0–0.2)
Basos: 2 %
EOS (ABSOLUTE): 1.6 10*3/uL — ABNORMAL HIGH (ref 0.0–0.4)
Eos: 11 %
Hematocrit: 42 % (ref 37.5–51.0)
Hemoglobin: 13.5 g/dL (ref 13.0–17.7)
Immature Grans (Abs): 0.2 10*3/uL — ABNORMAL HIGH (ref 0.0–0.1)
Immature Granulocytes: 1 %
Lymphocytes Absolute: 4.8 10*3/uL — ABNORMAL HIGH (ref 0.7–3.1)
Lymphs: 33 %
MCH: 26.6 pg (ref 26.6–33.0)
MCHC: 32.1 g/dL (ref 31.5–35.7)
MCV: 83 fL (ref 79–97)
Monocytes Absolute: 2.4 10*3/uL — ABNORMAL HIGH (ref 0.1–0.9)
Monocytes: 16 %
Neutrophils Absolute: 5.4 10*3/uL (ref 1.4–7.0)
Neutrophils: 37 %
Platelets: 255 10*3/uL (ref 150–450)
RBC: 5.08 x10E6/uL (ref 4.14–5.80)
RDW: 13.4 % (ref 11.6–15.4)
WBC: 14.7 10*3/uL — ABNORMAL HIGH (ref 3.4–10.8)

## 2019-03-09 LAB — COMPREHENSIVE METABOLIC PANEL
ALT: 20 IU/L (ref 0–44)
AST: 12 IU/L (ref 0–40)
Albumin/Globulin Ratio: 2.8 — ABNORMAL HIGH (ref 1.2–2.2)
Albumin: 4.2 g/dL (ref 4.0–5.0)
Alkaline Phosphatase: 77 IU/L (ref 39–117)
BUN/Creatinine Ratio: 12 (ref 9–20)
BUN: 25 mg/dL — ABNORMAL HIGH (ref 6–24)
Bilirubin Total: 0.5 mg/dL (ref 0.0–1.2)
CO2: 22 mmol/L (ref 20–29)
Calcium: 9.2 mg/dL (ref 8.7–10.2)
Chloride: 105 mmol/L (ref 96–106)
Creatinine, Ser: 2.01 mg/dL — ABNORMAL HIGH (ref 0.76–1.27)
GFR calc Af Amer: 45 mL/min/{1.73_m2} — ABNORMAL LOW (ref >59)
GFR calc non Af Amer: 39 mL/min/{1.73_m2} — ABNORMAL LOW (ref >59)
Globulin, Total: 1.5 g/dL (ref 1.5–4.5)
Glucose: 121 mg/dL — ABNORMAL HIGH (ref 65–99)
Potassium: 4.2 mmol/L (ref 3.5–5.2)
Sodium: 140 mmol/L (ref 134–144)
Total Protein: 5.7 g/dL — ABNORMAL LOW (ref 6.0–8.5)

## 2019-03-10 ENCOUNTER — Ambulatory Visit (INDEPENDENT_AMBULATORY_CARE_PROVIDER_SITE_OTHER): Payer: BC Managed Care – PPO | Admitting: Family Medicine

## 2019-03-10 ENCOUNTER — Encounter: Payer: Self-pay | Admitting: Hematology

## 2019-03-10 ENCOUNTER — Encounter: Payer: Self-pay | Admitting: Family Medicine

## 2019-03-10 ENCOUNTER — Other Ambulatory Visit: Payer: Self-pay

## 2019-03-10 VITALS — BP 110/68 | Temp 98.0°F | Ht 70.0 in | Wt 200.0 lb

## 2019-03-10 DIAGNOSIS — B451 Cerebral cryptococcosis: Secondary | ICD-10-CM | POA: Diagnosis not present

## 2019-03-10 DIAGNOSIS — R7989 Other specified abnormal findings of blood chemistry: Secondary | ICD-10-CM

## 2019-03-10 LAB — POCT URINALYSIS DIPSTICK
Spec Grav, UA: 1.015 (ref 1.010–1.025)
pH, UA: 5 (ref 5.0–8.0)

## 2019-03-10 MED ORDER — MECLIZINE HCL 25 MG PO TABS: 25.0000 mg | ORAL_TABLET | Freq: Three times a day (TID) | ORAL | 1 refills | Status: DC | PRN

## 2019-03-10 NOTE — Progress Notes (Signed)
Subjective:    Patient ID: David Whitehead, male    DOB: 1971/03/11, 48 y.o.   MRN: [id number]  HPIhospitalization follow up and elevated creatinine.  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  He does have a shunt.  This patient's been having some significant troubles with elevated creatinine.  He has never had this problem before.  They have encouraged him to take in plenty of fluids.  Currently specialist feels medication outweighs the risk.  So therefore we are monitoring the creatinine and GFR closely. Results for orders placed or performed in visit on 03/10/19  POCT urinalysis dipstick  Result Value Ref Range   Color, UA     Clarity, UA     Glucose, UA     Bilirubin, UA     Ketones, UA     Spec Grav, UA 1.015 1.010 - 1.025   Blood, UA     pH, UA 5.0 5.0 - 8.0   Protein, UA     Urobilinogen, UA     Nitrite, UA     Leukocytes, UA     Appearance     Odor     Patient also has some intermittent dizziness he states the room feels like it spinning with dry heaves had some out of the blue last for a few minutes at a time then goes away denies any unilateral numbness weakness denies double vision.  Denies any headaches fever chills sweats   Review of Systems  Constitutional: Negative for diaphoresis and fatigue.  HENT: Negative for congestion and rhinorrhea.   Respiratory: Negative for cough and shortness of breath.   Cardiovascular: Negative for chest pain and leg swelling.  Gastrointestinal: Negative for abdominal pain and diarrhea.  Skin: Negative for color change and rash.  Neurological: Positive for dizziness. Negative for headaches.  Psychiatric/Behavioral: Negative for behavioral problems and confusion.       Objective:   Physical Exam Vitals signs reviewed.  Constitutional:      General: He is not in acute distress. HENT:     Head: Normocephalic and atraumatic.  Eyes:     General:        Right eye: No discharge.         Left eye: No discharge.  Neck:     Trachea: No tracheal deviation.  Cardiovascular:     Rate and Rhythm: Normal rate and regular rhythm.     Heart sounds: Normal heart sounds. No murmur.  Pulmonary:     Effort: Pulmonary effort is normal. No respiratory distress.     Breath sounds: Normal breath sounds.  Lymphadenopathy:     Cervical: No cervical adenopathy.  Skin:    General: Skin is warm and dry.  Neurological:     Mental Status: He is alert.     Coordination: Coordination normal.  Psychiatric:        Behavior: Behavior normal.   Patient has drug reaction rash Romberg negative patient does waver finger-to-nose is normal.  No nystagmus is noted.       Assessment & Plan:  Dizziness more than likely inner ear hopefully will go away on its own without any intervention meclizine if necessary  Rash this comes and goes with intensity but currently is felt to be due to his medication he has 2 more weeks of medication to go  Elevated creatinine the urine is negative for protein no RBCs or WBCs seen under the microscope we will follow the lab  work closely and keep his specialist apprised that how things are going currently they recommend continuing the current dosage if his creatinine goes up they do recommend try to give him some IV fluids to see if that might help In my opinion we would be getting nephrology consult if it does go up

## 2019-03-10 NOTE — Telephone Encounter (Signed)
Yes, I agree. His dose is fine to continue as is.  His CrCl is >50 ml/min and the benefit outweighs the risk here in my opinion.

## 2019-03-11 NOTE — Telephone Encounter (Signed)
There have been some case reports with posaconazole but only like 48% of patients were successful (14/29). Vori may also be effective but there isn't much data. Itraconazole is less effective, so I definitely wouldn't do that. As far as rashes are concerned, I'm not sure which ones are less likely to cause them. I would be nervous to try anything other than fluconazole, especially if you say the rash was present before the addition of fluconazole.

## 2019-03-14 ENCOUNTER — Encounter: Payer: Self-pay | Admitting: Infectious Disease

## 2019-03-14 ENCOUNTER — Other Ambulatory Visit: Payer: Self-pay

## 2019-03-14 ENCOUNTER — Telehealth: Payer: Self-pay | Admitting: Hematology

## 2019-03-14 ENCOUNTER — Ambulatory Visit (INDEPENDENT_AMBULATORY_CARE_PROVIDER_SITE_OTHER): Payer: BC Managed Care – PPO | Admitting: Infectious Disease

## 2019-03-14 VITALS — BP 115/75 | HR 103 | Temp 97.8°F | Wt 216.0 lb

## 2019-03-14 DIAGNOSIS — R911 Solitary pulmonary nodule: Secondary | ICD-10-CM

## 2019-03-14 DIAGNOSIS — R609 Edema, unspecified: Secondary | ICD-10-CM | POA: Insufficient documentation

## 2019-03-14 DIAGNOSIS — G939 Disorder of brain, unspecified: Secondary | ICD-10-CM | POA: Diagnosis not present

## 2019-03-14 DIAGNOSIS — L270 Generalized skin eruption due to drugs and medicaments taken internally: Secondary | ICD-10-CM | POA: Diagnosis not present

## 2019-03-14 DIAGNOSIS — B451 Cerebral cryptococcosis: Secondary | ICD-10-CM | POA: Diagnosis not present

## 2019-03-14 DIAGNOSIS — R600 Localized edema: Secondary | ICD-10-CM | POA: Insufficient documentation

## 2019-03-14 DIAGNOSIS — C9110 Chronic lymphocytic leukemia of B-cell type not having achieved remission: Secondary | ICD-10-CM | POA: Diagnosis not present

## 2019-03-14 HISTORY — DX: Localized edema: R60.0

## 2019-03-14 HISTORY — DX: Edema, unspecified: R60.9

## 2019-03-14 LAB — CBC WITH DIFFERENTIAL/PLATELET
Abs Immature Granulocytes: 0 10*3/uL (ref 0.00–0.07)
Basophils Absolute: 0 10*3/uL (ref 0.0–0.1)
Basophils Relative: 0 %
Eosinophils Absolute: 4.1 10*3/uL — ABNORMAL HIGH (ref 0.0–0.5)
Eosinophils Relative: 32 %
HCT: 40.4 % (ref 39.0–52.0)
Hemoglobin: 13 g/dL (ref 13.0–17.0)
Lymphocytes Relative: 27 %
Lymphs Abs: 3.5 10*3/uL (ref 0.7–4.0)
MCH: 26.7 pg (ref 26.0–34.0)
MCHC: 32.2 g/dL (ref 30.0–36.0)
MCV: 83.1 fL (ref 80.0–100.0)
Monocytes Absolute: 0.8 10*3/uL (ref 0.1–1.0)
Monocytes Relative: 6 %
Neutro Abs: 4.5 10*3/uL (ref 1.7–7.7)
Neutrophils Relative %: 35 %
Platelets: 142 10*3/uL — ABNORMAL LOW (ref 150–400)
RBC: 4.86 MIL/uL (ref 4.22–5.81)
RDW: 14.1 % (ref 11.5–15.5)
WBC: 12.9 10*3/uL — ABNORMAL HIGH (ref 4.0–10.5)
nRBC: 0 % (ref 0.0–0.2)

## 2019-03-14 LAB — COMPREHENSIVE METABOLIC PANEL
ALT: 17 U/L (ref 0–44)
AST: 13 U/L — ABNORMAL LOW (ref 15–41)
Albumin: 3.3 g/dL — ABNORMAL LOW (ref 3.5–5.0)
Alkaline Phosphatase: 57 U/L (ref 38–126)
Anion gap: 7 (ref 5–15)
BUN: 15 mg/dL (ref 6–20)
CO2: 23 mmol/L (ref 22–32)
Calcium: 8.6 mg/dL — ABNORMAL LOW (ref 8.9–10.3)
Chloride: 109 mmol/L (ref 98–111)
Creatinine, Ser: 2.02 mg/dL — ABNORMAL HIGH (ref 0.61–1.24)
GFR calc Af Amer: 45 mL/min — ABNORMAL LOW (ref >60)
GFR calc non Af Amer: 38 mL/min — ABNORMAL LOW (ref >60)
Glucose, Bld: 111 mg/dL — ABNORMAL HIGH (ref 70–99)
Potassium: 4.2 mmol/L (ref 3.5–5.1)
Sodium: 139 mmol/L (ref 135–145)
Total Bilirubin: 0.6 mg/dL (ref 0.3–1.2)
Total Protein: 4.5 g/dL — ABNORMAL LOW (ref 6.5–8.1)

## 2019-03-14 NOTE — Telephone Encounter (Signed)
Called and  Spoke with patient regarding appointments added per 11/2 staff message

## 2019-03-14 NOTE — Progress Notes (Signed)
Chief complaint: Lower extremity edema along with continued rash and pruritus though the latter 2 complaints are improving Subjective:    Patient ID: David Whitehead, male    DOB: 1970-05-31, 48 y.o.   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  He underwent VP shunt placement by Dr. Herold Harms while in the hospital for control of his high intracranial pressures.  He completed nearly a month of amphotericin with flucytosine.  Towards the end of his month of therapy he did develop a drug rash and acute kidney injury both attributed to the amphotericin.  He was given prednisone for 3 days and still had the rash at discharge.  He had follow-up labs as an outpatient with his primary care physician which showed his creatinine was still around the same range as it was when he was discharged from the hospital.  He thought the rash was getting worse but actually since he was seen by the PCP it is actually been improving.  Per Dr. [name] the rash was largely on his flank and torso but extended to his extremities when it was at its worse.  The patient confirmed this and showed me that the rash had disappeared from his lower extremities and his back.  It also is largely appeared from his torso.  There is a little bit of it on his chest and more prominent one on his left upper arm.  Since Saturday he is noted creasing lower extremity edema.  He says his urine output is the way it has been always been.  We will check some stat labs including a CMP today  He is avoiding nonsteroidals and nephrotoxic drugs.  He was having some dizziness but this is now resolved as well.   Past Medical History:  Diagnosis Date  . Cancer ([place])   . Chronic headache   . CLL (chronic lymphocytic leukemia) ([place])   . Diplopia   . Hypertension   . Leukocytosis 04/01/2018  . Sleep apnea    uses a cpap    Past Surgical  History:  Procedure Laterality Date  . AXILLARY LYMPH NODE DISSECTION Right 04/16/2018   Procedure: AXILLARY LYMPH NODE DEEP EXCISION;  Surgeon: Fanny Skates, MD;  Location: [facility];  Service: General;  Laterality: Right;  . FINGER SURGERY    . VENTRICULOPERITONEAL SHUNT Right 02/18/2019   Procedure: SHUNT INSERTION VENTRICULAR-PERITONEAL;  Surgeon: Ashok Pall, MD;  Location: [place] [facility];  Service: Neurosurgery;  Laterality: Right;  SHUNT INSERTION VENTRICULAR-PERITONEAL    Family History  Problem Relation Age of Onset  . Colon cancer Father 64       passed from [place]  . Colon cancer Paternal Uncle 75       passed from [facility]      Social History   Socioeconomic History  . Marital status: Married    Spouse name: [name]  . Number of children: 0  . Years of education: 93  . Highest education level: Not on file  Occupational History  . Not on file  Social Needs  . Financial resource strain: Not very hard  . Food insecurity    Worry: Never true    Inability: Never true  . Transportation needs    Medical: No    Non-medical: No  Tobacco Use  . Smoking status: Never Smoker  . Smokeless tobacco: Never Used  Substance and Sexual Activity  . Alcohol use: Not Currently  .  Drug use: Never  . Sexual activity: Yes  Lifestyle  . Physical activity    Days per week: Not on file    Minutes per session: Not on file  . Stress: Not on file  Relationships  . Social Herbalist on phone: Not on file    Gets together: Not on file    Attends religious service: Not on file    Active member of club or organization: Not on file    Attends meetings of clubs or organizations: Not on file    Relationship status: Not on file  Other Topics Concern  . Not on file  Social History Narrative   Right handed    Lives with wife, [name]   Caffeine use: rare    Allergies  Allergen Reactions  . Bactrim [Sulfamethoxazole-Trimethoprim] Rash  . Latex Other (See Comments)    Took skin off when  removed tape  . Amoxicillin Rash    Has patient had a PCN reaction causing immediate rash, facial/tongue/throat swelling, SOB or lightheadedness with hypotension: No Has patient had a PCN reaction causing severe rash involving mucus membranes or skin necrosis: No Has patient had a PCN reaction that required hospitalization: No Has patient had a PCN reaction occurring within the last 10 years: No If all of the above answers are "NO", then may proceed with Cephalosporin use.      Current Outpatient Medications:  .  acetaminophen (TYLENOL) 500 MG tablet, Take 500 mg by mouth every 6 (six) hours as needed for mild pain., Disp: , Rfl:  .  acyclovir (ZOVIRAX) 400 MG tablet, Take 400 mg by mouth 2 (two) times daily. , Disp: , Rfl:  .  cetirizine (ZYRTEC) 10 MG tablet, Take 10 mg by mouth daily., Disp: , Rfl:  .  fluconazole (DIFLUCAN) 200 MG tablet, Take 2 tablets (400 mg total) by mouth daily., Disp: 30 tablet, Rfl: 0 .  hydrOXYzine (ATARAX/VISTARIL) 50 MG tablet, Take 1 tablet (50 mg total) by mouth 3 (three) times daily., Disp: 30 tablet, Rfl: 0 .  pantoprazole (PROTONIX) 40 MG tablet, Take 1 tablet (40 mg total) by mouth daily., Disp: 30 tablet, Rfl: 0 .  atovaquone (MEPRON) 750 MG/5ML suspension, Take 750 mg by mouth daily with breakfast. , Disp: , Rfl:  .  Ibrutinib (IMBRUVICA) 420 MG TABS, Take by mouth daily., Disp: , Rfl:  .  meclizine (ANTIVERT) 25 MG tablet, Take 1 tablet (25 mg total) by mouth 3 (three) times daily as needed for dizziness or nausea. (Patient not taking: Reported on 03/14/2019), Disp: 30 tablet, Rfl: 1 .  ondansetron (ZOFRAN) 4 MG tablet, Take 1 tablet (4 mg total) by mouth every 4 (four) hours as needed for nausea or vomiting. (Patient not taking: Reported on 03/10/2019), Disp: 20 tablet, Rfl: 0   Review of Systems  Constitutional: Negative for activity change, appetite change, chills, diaphoresis, fatigue, fever and unexpected weight change.  HENT: Negative for  congestion, rhinorrhea, sinus pressure, sneezing, sore throat and trouble swallowing.   Eyes: Negative for photophobia and visual disturbance.  Respiratory: Negative for cough, chest tightness, shortness of breath, wheezing and stridor.   Cardiovascular: Positive for leg swelling. Negative for chest pain and palpitations.  Gastrointestinal: Negative for abdominal distention, abdominal pain, anal bleeding, blood in stool, constipation, diarrhea and vomiting.  Genitourinary: Negative for difficulty urinating, dysuria, flank pain and hematuria.  Musculoskeletal: Negative for arthralgias, back pain, gait problem, joint swelling and myalgias.  Skin: Positive for rash. Negative for  color change, pallor and wound.  Neurological: Positive for dizziness. Negative for tremors, weakness and light-headedness.  Hematological: Negative for adenopathy. Does not bruise/bleed easily.  Psychiatric/Behavioral: Negative for agitation, behavioral problems, confusion, decreased concentration, dysphoric mood and sleep disturbance.       Objective:   Physical Exam Constitutional:      General: He is not in acute distress.    Appearance: Normal appearance. He is well-developed. He is not ill-appearing or diaphoretic.  HENT:     Head: Normocephalic and atraumatic.     Right Ear: Hearing and external ear normal.     Left Ear: Hearing and external ear normal.     Nose: No nasal deformity or rhinorrhea.  Eyes:     General: No scleral icterus.    Conjunctiva/sclera: Conjunctivae normal.     Right eye: Right conjunctiva is not injected.     Left eye: Left conjunctiva is not injected.     Pupils: Pupils are equal, round, and reactive to light.  Neck:     Musculoskeletal: Normal range of motion and neck supple.     Vascular: No JVD.  Cardiovascular:     Rate and Rhythm: Normal rate and regular rhythm.     Heart sounds: S1 normal and S2 normal.  Abdominal:     General: Bowel sounds are normal.     Palpations:  Abdomen is soft.  Musculoskeletal: Normal range of motion.     Right shoulder: Normal.     Left shoulder: Normal.     Right hip: Normal.     Left hip: Normal.     Right knee: Normal.     Left knee: Normal.  Lymphadenopathy:     Head:     Right side of head: No submandibular, preauricular or posterior auricular adenopathy.     Left side of head: No submandibular, preauricular or posterior auricular adenopathy.     Cervical: No cervical adenopathy.     Right cervical: No superficial or deep cervical adenopathy.    Left cervical: No superficial or deep cervical adenopathy.  Skin:    General: Skin is warm and dry.     Coloration: Skin is not pale.     Findings: Rash present. No abrasion, bruising, ecchymosis, erythema or lesion.     Nails: There is no clubbing.   Neurological:     General: No focal deficit present.     Mental Status: He is alert and oriented to person, place, and time. Mental status is at baseline.     Sensory: No sensory deficit.     Coordination: Coordination normal.     Gait: Gait normal.  Psychiatric:        Attention and Perception: He is attentive.        Mood and Affect: Mood normal.        Speech: Speech normal.        Behavior: Behavior normal. Behavior is cooperative.        Thought Content: Thought content normal.        Judgment: Judgment normal.    Rash today on March 14, 2019:            Assessment & Plan:   Cryptococcal meningitis: Continue dose of fluconazole 400 mg as part of his consolidative therapy and then will go down to 200 mg and make sure that he completes a year total of therapy between his amphotericin induction fluconazole consolidation and maintenance.  Acute kidney injury due to amphotericin: I am bothered  by his increased edema abscess could also be due to his increased fluid intake.  I will check a stat CMP today and CBC.  He can take the acyclovir off his drug list I do not think is causing much kidney injuries but  I do not see a strong need for it he is avoiding nonsteroidals.  Drug rash: Does indeed appear to have been due to amphotericin since it was happening while on amphotericin and has now improved.  Fortunately not due to fluconazole if we had needed to move with him fluconazole Dr. Cassandria Santee at [facility] and recommended voriconazole but I am glad we do not have to do that option at this point.   CLL: Weighing treatment options versus risks of cryptococcal meningitis recurrence need to be considered I would also consider put him on prophylaxis with fluconazole if he goes back on immunosuppressive therapy.  I spent greater than 25 minutes with the patient including greater than 50% of time in face to face counsel of the patient regarding his cryptococcal meningitis and how we would treated his rash is acute kidney injury review of all of his labs and studies and in coordination of his care.

## 2019-03-16 ENCOUNTER — Other Ambulatory Visit: Payer: Self-pay | Admitting: Infectious Disease

## 2019-03-16 MED ORDER — FLUCONAZOLE 200 MG PO TABS: 400.0000 mg | ORAL_TABLET | Freq: Every day | ORAL | 11 refills | Status: DC

## 2019-03-16 MED ORDER — HYDROXYZINE HCL 50 MG PO TABS: 50.0000 mg | ORAL_TABLET | Freq: Three times a day (TID) | ORAL | 2 refills | Status: DC

## 2019-03-17 LAB — FUNGUS CULTURE RESULT

## 2019-03-17 LAB — FUNGAL ORGANISM REFLEX

## 2019-03-21 ENCOUNTER — Ambulatory Visit: Payer: BC Managed Care – PPO | Admitting: Infectious Disease

## 2019-03-22 LAB — CSF CULTURE W GRAM STAIN
MICRO NUMBER:: 923059
SPECIMEN QUALITY:: ADEQUATE

## 2019-03-22 LAB — CRYPTOCOCCAL AG, LTX SCR RFLX TITER
Cryptococcal Ag Screen: DETECTED — CR
Cryptococcal Ag Titer: 1:64 {titer} — AB
MICRO NUMBER:: 923058
SPECIMEN QUALITY:: ADEQUATE

## 2019-03-22 LAB — MYCOBACTERIA,CULT W/FLUOROCHROME SMEAR
MICRO NUMBER:: 923073
SMEAR:: NONE SEEN
SPECIMEN QUALITY:: ADEQUATE

## 2019-03-22 LAB — CSF CELL COUNT WITH DIFFERENTIAL
RBC Count, CSF: 510 cells/uL — ABNORMAL HIGH (ref 0–10)
WBC, CSF: 2 cells/uL (ref 0–5)

## 2019-03-22 LAB — LEUKEMIA/LYMPHOMA EVALUATION PANEL

## 2019-03-22 LAB — CNS IGG SYNTHESIS RATE, CSF+BLOOD
Albumin Serum: 4.8 g/dL (ref 3.5–5.2)
Albumin, CSF: 60.9 mg/dL — ABNORMAL HIGH (ref 8.0–42.0)
CNS-IgG Synthesis Rate: 2.2 mg/24 h (ref <=3.3)
IgG (Immunoglobin G), Serum: 471 mg/dL — ABNORMAL LOW (ref 600–1640)
IgG Total CSF: 3.4 mg/dL (ref 0.8–7.7)
IgG-Index: 0.57 (ref <0.66)

## 2019-03-22 LAB — PROTEIN, CSF: Total Protein, CSF: 92 mg/dL — ABNORMAL HIGH (ref 15–45)

## 2019-03-22 LAB — GLUCOSE, CSF: Glucose, CSF: 21 mg/dL — CL (ref 40–80)

## 2019-03-22 LAB — VDRL, CSF: VDRL Quant, CSF: NONREACTIVE

## 2019-03-22 LAB — OLIGOCLONAL BANDS, CSF + SERM

## 2019-03-24 ENCOUNTER — Inpatient Hospital Stay: Payer: BC Managed Care – PPO | Attending: Hematology

## 2019-03-24 ENCOUNTER — Encounter: Payer: Self-pay | Admitting: *Deleted

## 2019-03-24 ENCOUNTER — Inpatient Hospital Stay (HOSPITAL_BASED_OUTPATIENT_CLINIC_OR_DEPARTMENT_OTHER): Payer: BC Managed Care – PPO | Admitting: Hematology

## 2019-03-24 ENCOUNTER — Other Ambulatory Visit: Payer: Self-pay

## 2019-03-24 ENCOUNTER — Encounter: Payer: Self-pay | Admitting: Hematology

## 2019-03-24 VITALS — BP 123/80 | HR 87 | Temp 97.7°F | Resp 17 | Ht 70.0 in | Wt 212.1 lb

## 2019-03-24 DIAGNOSIS — Z79899 Other long term (current) drug therapy: Secondary | ICD-10-CM | POA: Insufficient documentation

## 2019-03-24 DIAGNOSIS — D696 Thrombocytopenia, unspecified: Secondary | ICD-10-CM | POA: Diagnosis not present

## 2019-03-24 DIAGNOSIS — C9110 Chronic lymphocytic leukemia of B-cell type not having achieved remission: Secondary | ICD-10-CM | POA: Insufficient documentation

## 2019-03-24 DIAGNOSIS — R7989 Other specified abnormal findings of blood chemistry: Secondary | ICD-10-CM | POA: Insufficient documentation

## 2019-03-24 DIAGNOSIS — D649 Anemia, unspecified: Secondary | ICD-10-CM | POA: Insufficient documentation

## 2019-03-24 DIAGNOSIS — Z9221 Personal history of antineoplastic chemotherapy: Secondary | ICD-10-CM | POA: Diagnosis not present

## 2019-03-24 DIAGNOSIS — B451 Cerebral cryptococcosis: Secondary | ICD-10-CM | POA: Insufficient documentation

## 2019-03-24 LAB — CMP (CANCER CENTER ONLY)
ALT: 14 U/L (ref 0–44)
AST: 12 U/L — ABNORMAL LOW (ref 15–41)
Albumin: 4.4 g/dL (ref 3.5–5.0)
Alkaline Phosphatase: 70 U/L (ref 38–126)
Anion gap: 7 (ref 5–15)
BUN: 19 mg/dL (ref 6–20)
CO2: 30 mmol/L (ref 22–32)
Calcium: 9 mg/dL (ref 8.9–10.3)
Chloride: 107 mmol/L (ref 98–111)
Creatinine: 1.63 mg/dL — ABNORMAL HIGH (ref 0.61–1.24)
GFR, Est AFR Am: 58 mL/min — ABNORMAL LOW (ref >60)
GFR, Estimated: 50 mL/min — ABNORMAL LOW (ref >60)
Glucose, Bld: 86 mg/dL (ref 70–99)
Potassium: 3.6 mmol/L (ref 3.5–5.1)
Sodium: 144 mmol/L (ref 135–145)
Total Bilirubin: 0.4 mg/dL (ref 0.3–1.2)
Total Protein: 5.6 g/dL — ABNORMAL LOW (ref 6.5–8.1)

## 2019-03-24 LAB — CBC WITH DIFFERENTIAL (CANCER CENTER ONLY)
Abs Immature Granulocytes: 0.03 10*3/uL (ref 0.00–0.07)
Basophils Absolute: 0.1 10*3/uL (ref 0.0–0.1)
Basophils Relative: 1 %
Eosinophils Absolute: 2 10*3/uL — ABNORMAL HIGH (ref 0.0–0.5)
Eosinophils Relative: 29 %
HCT: 36.1 % — ABNORMAL LOW (ref 39.0–52.0)
Hemoglobin: 11.7 g/dL — ABNORMAL LOW (ref 13.0–17.0)
Immature Granulocytes: 0 %
Lymphocytes Relative: 40 %
Lymphs Abs: 2.9 10*3/uL (ref 0.7–4.0)
MCH: 26.7 pg (ref 26.0–34.0)
MCHC: 32.4 g/dL (ref 30.0–36.0)
MCV: 82.4 fL (ref 80.0–100.0)
Monocytes Absolute: 0.8 10*3/uL (ref 0.1–1.0)
Monocytes Relative: 11 %
Neutro Abs: 1.4 10*3/uL — ABNORMAL LOW (ref 1.7–7.7)
Neutrophils Relative %: 19 %
Platelet Count: 116 10*3/uL — ABNORMAL LOW (ref 150–400)
RBC: 4.38 MIL/uL (ref 4.22–5.81)
RDW: 13.6 % (ref 11.5–15.5)
WBC Count: 7.2 10*3/uL (ref 4.0–10.5)
nRBC: 0 % (ref 0.0–0.2)

## 2019-03-24 LAB — LACTATE DEHYDROGENASE: LDH: 243 U/L — ABNORMAL HIGH (ref 98–192)

## 2019-03-24 LAB — SAVE SMEAR(SSMR), FOR PROVIDER SLIDE REVIEW

## 2019-03-24 NOTE — Progress Notes (Signed)
[facility] OFFICE PROGRESS NOTE  Patient Care Team: [name], MD as PCP - General (Family Medicine) Cordelia Poche, RN as Oncology Nurse Navigator [name], MD as Medical Oncologist (Hematology) Danie Binder, MD as Consulting Physician (Gastroenterology)  HEME/ONC OVERVIEW: 1. CLL with bulky lymphadenopathy; RAI Stage IV, intermediate risk; p53 deletion- -03/2018:   PB flow showed CD5+, CD23+ monoclonal B-cells, c/w CLL; IgHV mutation negative  FISH (PB) positive for JQB(34L93); negative t(11;14), p53 deletion/amplification, and ATM (11q22.3) deletion  CT neck, CAP showed bulky lymphadenopathy in bilateral cervical LN's and throughout the chest, abdomen and pelvis; marked splenomegaly -04/2018: R axillary excision LN biopsy showed CLL/SLL (cyclin D1 neg) -05/2017 - present: ibrutinib   11/2018: CT CAP showed near normalization of all LN's except R hilar LN ~3.8cm (improving)  TREATMENT REGIMEN:  05/14/2018 - 02/02/2019: ibrutinib 445m daily  PERTINENT NON-HEM/ONC PROBLEMS:  1. 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  While IVIG is usually administered to reduce the risk of bacterial infections, it may provide some benefit for reducing the risk of other types of infection, including fungal infection.   -Therefore, if his quantitative IgG is less than 500, we can consider adding prophylactic IVIG 0.432mkg q4weeks with periodic monitoring of IgG level to ensure appropriate response.  In general, the duration of prophylactic IVIG is 6 months. 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, due to his immunocompromised state from CLL  Normocytic anemia -Likely multifactorial, including bone marrow suppression from prolonged antifungal therapy and CLL -Hgb 11.7, slightly lower than earlier this month -Clinically, patient denies any symptoms of bleeding -If he develops progressive anemia, he may require bone marrow biopsy as discussed above -In addition, we can also consider a trial of steroids to rule out autoimmune hemolytic anemia  Thrombocytopenia -Plts 116k today, slightly lower than earlier this month -Clinically, patient denies any symptoms of bleeding -We will monitor for now -If he develops worsening thrombocytopenia, he may require bone marrow biopsy as well as a trial of steroid to rule out ITP  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 IgG, IgA, IgM    Standing Status:   Future    Standing Expiration Date:   04/27/2020   All questions were answered. The patient knows to call the clinic with any problems, questions or concerns. No barriers to learning was detected.  Return in 1 month for labs and clinic follow-up.  [name], MD 03/24/2019 12:35 PM  CHIEF COMPLAINT: "I am feeling alright"  INTERVAL HISTORY: David Whitehead returns to clinic for follow-up of Stage IV CLL.  Patient was recently discharged from the hospital after being admitted for 4 weeks of IV amphotericin for cryptococcal meningitis.  He remains on fluconazole 400 mg daily.  He reports feeling well today, and he denies any recurrent headache or vision change.  He has not had any recurrent lymphadenopathy or constitutional symptoms since stopping ibrutinib in late 01/2019.  He denies any other complaint today.  REVIEW OF SYSTEMS:   Constitutional: ( - ) fevers, ( - )  chills , ( - ) night sweats Eyes: ( - ) blurriness of vision, ( - ) double vision, ( - ) watery eyes Ears, nose, mouth, throat, and face: ( - ) mucositis, ( - ) sore throat Respiratory: ( - ) cough, ( - ) dyspnea, ( - ) wheezes Cardiovascular: ( - ) palpitation, ( - ) chest discomfort, ( - ) lower extremity swelling Gastrointestinal:  ( - ) nausea, ( - ) heartburn, ( - ) change in bowel habits Skin: ( - ) abnormal skin rashes Lymphatics: ( - ) new lymphadenopathy, ( - ) easy bruising Neurological: ( - ) numbness, ( - ) tingling, ( - ) new weaknesses Behavioral/Psych: ( - ) mood change, ( - ) new changes  All other systems were reviewed with the patient and are negative.  SUMMARY OF ONCOLOGIC HISTORY: Oncology History  CLL (chronic lymphocytic leukemia) ([place])  04/01/2018 Initial Diagnosis   CLL (chronic lymphocytic leukemia) ([place])   04/05/2018  Pathology Results   Peripheral Blood Flow Cytometry - MONOCLONAL B-CELL POPULATION IDENTIFIED. - SEE NOTE.   04/05/2018 Pathology Results   PB FISH: No evidence of CCND1-IGH [t(11;14)] gene rearrangement. No evidence of trisomy 11 or gain of 11q. (excludes mantle cell lymphoma) No evidence of trisomy 12.  Mono-allelic deletion of [id number] (13q14.3) locus is detected. No evidence of p53 (17p13( deletion or amplification. No evidence of ATM (11q22.3) deletion.    04/07/2018 Imaging   CT neck w/ contrast: IMPRESSION: Bulky bilateral cervical lymphadenopathy consistent with lymphoproliferative disease/lymphoma.   04/07/2018 Imaging   CT CAP w/ contrast: IMPRESSION: 1. Bulky lymphadenopathy throughout the chest, abdomen and pelvis, as detailed above, highly suspicious for lymphoma. 2. Marked splenomegaly. 3. 9 mm pulmonary nodule within the anterior inferior portion of the RIGHT upper lobe. Subtle low-density nodule within the LEFT lower lobe. These are too small to definitively characterize. Neoplastic pulmonary nodules cannot be excluded. Consider PET-CT for further characterization.   04/16/2018 Surgery   Right axillary LN excisional biopsy   04/16/2018 Pathology Results   (Accession: [id number]) Lymph node for lymphoma, Right Axillary - CHRONIC LYMPHOCYTIC LEUKEMIA/SMALL LYMPHOCYTIC LYMPHOMA.     I have reviewed the past medical history, past surgical history, social history and family history with the patient and they are unchanged from previous note.  ALLERGIES:  is allergic to bactrim [sulfamethoxazole-trimethoprim]; latex; and amoxicillin.  MEDICATIONS:  Current Outpatient Medications  Medication Sig Dispense Refill  . acetaminophen (TYLENOL) 500 MG tablet Take 500 mg  by mouth every 6 (six) hours as needed for mild pain.    . cetirizine (ZYRTEC) 10 MG tablet Take 10 mg by mouth daily.    . fluconazole (DIFLUCAN) 200 MG tablet Take 2 tablets (400 mg total) by mouth daily.  60 tablet 11  . hydrOXYzine (ATARAX/VISTARIL) 50 MG tablet Take 1 tablet (50 mg total) by mouth 3 (three) times daily. (Patient taking differently: Take 50 mg by mouth 2 (two) times daily. ) 30 tablet 2  . meclizine (ANTIVERT) 25 MG tablet Take 1 tablet (25 mg total) by mouth 3 (three) times daily as needed for dizziness or nausea. 30 tablet 1  . Prenatal Vit-Fe Fumarate-FA (PRENATAL MULTIVITAMIN) TABS tablet Take 1 tablet by mouth daily at 12 noon.     No current facility-administered medications for this visit.     PHYSICAL EXAMINATION: ECOG PERFORMANCE STATUS: 0 - Asymptomatic  Today's Vitals   03/24/19 1135  BP: 123/80  Pulse: 87  Resp: 17  Temp: 97.7 F (36.5 C)  TempSrc: Temporal  SpO2: 100%  Weight: 212 lb 1.9 oz (96.2 kg)  Height: _0  (1.778 m)   Body mass index is 30.44 kg/m.  Filed Weights   03/24/19 1135  Weight: 212 lb 1.9 oz (96.2 kg)    GENERAL: alert, no distress and comfortable SKIN: skin color, texture, turgor are normal, no rashes or significant lesions EYES: conjunctiva are pink and non-injected, sclera clear OROPHARYNX: no exudate, no erythema; lips, buccal mucosa, and tongue normal  NECK: supple, non-tender LYMPH:  Shotty subcentimeter bilateral cervical LN's, <1cm  LUNGS: clear to auscultation with normal breathing effort HEART: regular rate & rhythm and no murmurs and no lower extremity edema ABDOMEN: soft, non-tender, non-distended, normal bowel sounds Musculoskeletal: no cyanosis of digits and no clubbing  PSYCH: alert & oriented x 3, fluent speech NEURO: no focal motor/sensory deficits  LABORATORY DATA:  I have reviewed the data as listed    Component Value Date/Time   NA 144 03/24/2019 1107   NA 140 03/08/2019 1438   K 3.6 03/24/2019 1107   CL 107 03/24/2019 1107   CO2 30 03/24/2019 1107   GLUCOSE 86 03/24/2019 1107   BUN 19 03/24/2019 1107   BUN 25 (H) 03/08/2019 1438   CREATININE 1.63 (H) 03/24/2019 1107   CALCIUM 9.0 03/24/2019  1107   PROT 5.6 (L) 03/24/2019 1107   PROT 5.7 (L) 03/08/2019 1438   ALBUMIN 4.4 03/24/2019 1107   ALBUMIN 4.2 03/08/2019 1438   ALBUMIN 4.8 02/04/2019 0930   AST 12 (L) 03/24/2019 1107   ALT 14 03/24/2019 1107   ALKPHOS 70 03/24/2019 1107   BILITOT 0.4 03/24/2019 1107   GFRNONAA 50 (L) 03/24/2019 1107   GFRAA 58 (L) 03/24/2019 1107    No results found for: SPEP, UPEP  Lab Results  Component Value Date   WBC 7.2 03/24/2019   NEUTROABS 1.4 (L) 03/24/2019   HGB 11.7 (L) 03/24/2019   HCT 36.1 (L) 03/24/2019   MCV 82.4 03/24/2019   PLT 116 (L) 03/24/2019      Chemistry      Component Value Date/Time   NA 144 03/24/2019 1107   NA 140 03/08/2019 1438   K 3.6 03/24/2019 1107   CL 107 03/24/2019 1107   CO2 30 03/24/2019 1107   BUN 19 03/24/2019 1107   BUN 25 (H) 03/08/2019 1438   CREATININE 1.63 (H) 03/24/2019 1107      Component Value Date/Time   CALCIUM 9.0 03/24/2019 1107  ALKPHOS 70 03/24/2019 1107   AST 12 (L) 03/24/2019 1107   ALT 14 03/24/2019 1107   BILITOT 0.4 03/24/2019 1107       RADIOGRAPHIC STUDIES: I have personally reviewed the radiological images as listed below and agreed with the findings in the report. No results found.

## 2019-03-24 NOTE — Progress Notes (Signed)
Patient is seen in the office after a lengthy hospital admission for meningitis. His treatment for CLL has been on hold since diagnosis. Visitied with patient to assess for needs after hospitalization. He is doing well. Almost feels back to baseline. Still has some itching and swelling. He is currently out of work and at this time, unsure if he will be able to go back. He has no needs at this time, but has my contact information if he has any questions or concerns.

## 2019-03-29 ENCOUNTER — Ambulatory Visit (INDEPENDENT_AMBULATORY_CARE_PROVIDER_SITE_OTHER): Payer: BC Managed Care – PPO | Admitting: Family Medicine

## 2019-03-29 ENCOUNTER — Other Ambulatory Visit: Payer: Self-pay

## 2019-03-29 VITALS — BP 118/80

## 2019-03-29 DIAGNOSIS — R7989 Other specified abnormal findings of blood chemistry: Secondary | ICD-10-CM | POA: Diagnosis not present

## 2019-03-29 NOTE — Progress Notes (Signed)
   Subjective:    Patient ID: David Whitehead, male    DOB: 21-Jan-1971, 48 y.o.   MRN: [id number]  HPI2 month follow up. Pt states he is doing well and no concerns.  Patient is here today for follow-up of blood pressure he was on blood pressure medications.  He also had cryptococcal meningitis which could have been contributing to his blood pressure Please see extensive number of notes from the oncologist infectious disease specialist and hospital. Hemoglobin recently lower as well as platelets If this trend continues he may well need to have a bone marrow biopsy This is being followed by oncology The patient is doing the best he can and staying away from any infections trying to eat healthy try to do some walking but has a lot of fatigue issues   Review of Systems  Constitutional: Negative for activity change, fatigue and fever.  HENT: Negative for congestion and rhinorrhea.   Respiratory: Negative for cough and shortness of breath.   Cardiovascular: Negative for chest pain and leg swelling.  Gastrointestinal: Negative for abdominal pain, diarrhea and nausea.  Genitourinary: Negative for dysuria and hematuria.  Neurological: Negative for weakness and headaches.  Psychiatric/Behavioral: Negative for agitation and behavioral problems.       Objective:   Physical Exam Vitals signs reviewed.  Constitutional:      General: He is not in acute distress. HENT:     Head: Normocephalic and atraumatic.  Eyes:     General:        Right eye: No discharge.        Left eye: No discharge.  Neck:     Trachea: No tracheal deviation.  Cardiovascular:     Rate and Rhythm: Normal rate and regular rhythm.     Heart sounds: Normal heart sounds. No murmur.  Pulmonary:     Effort: Pulmonary effort is normal. No respiratory distress.     Breath sounds: Normal breath sounds.  Lymphadenopathy:     Cervical: No cervical adenopathy.  Skin:    General: Skin is warm and dry.  Neurological:   Mental Status: He is alert.     Coordination: Coordination normal.  Psychiatric:        Behavior: Behavior normal.           Assessment & Plan:  Patient doing very well overall no longer having blood pressure issues Having significant troubles though with fatigue and tiredness patient is going to work hard at healthy eating regular activity he will follow-up with [place] for a wellness visit in the summer he will maintain protection against Covid and he will follow-up with hematology oncology as well as infectious disease on a regular basis  Elevated serum creatinine related to probable kidney damage from previous medications his specialists are following this

## 2019-03-30 ENCOUNTER — Other Ambulatory Visit (HOSPITAL_COMMUNITY)
Admission: RE | Admit: 2019-03-30 | Discharge: 2019-03-30 | Disposition: A | Discharge status: Home / Self Care | Payer: BC Managed Care – PPO | Source: Ambulatory Visit | Attending: Gastroenterology | Admitting: Gastroenterology

## 2019-03-30 ENCOUNTER — Other Ambulatory Visit: Payer: Self-pay

## 2019-03-30 DIAGNOSIS — Z20828 Contact with and (suspected) exposure to other viral communicable diseases: Secondary | ICD-10-CM | POA: Diagnosis not present

## 2019-03-30 DIAGNOSIS — Z01812 Encounter for preprocedural laboratory examination: Secondary | ICD-10-CM | POA: Diagnosis not present

## 2019-03-31 LAB — SARS CORONAVIRUS 2 (TAT 6-24 HRS): SARS Coronavirus 2: NEGATIVE

## 2019-04-01 ENCOUNTER — Other Ambulatory Visit: Payer: Self-pay

## 2019-04-01 ENCOUNTER — Encounter (HOSPITAL_COMMUNITY)
Admission: RE | Disposition: A | Discharge status: Home / Self Care | Payer: Self-pay | Source: Home / Self Care | Attending: Gastroenterology

## 2019-04-01 ENCOUNTER — Ambulatory Visit (HOSPITAL_COMMUNITY)
Admission: RE | Admit: 2019-04-01 | Discharge: 2019-04-01 | Disposition: A | Discharge status: Home / Self Care | Payer: BC Managed Care – PPO | Attending: Gastroenterology | Admitting: Gastroenterology

## 2019-04-01 ENCOUNTER — Encounter (HOSPITAL_COMMUNITY): Payer: Self-pay | Admitting: *Deleted

## 2019-04-01 DIAGNOSIS — D509 Iron deficiency anemia, unspecified: Secondary | ICD-10-CM | POA: Insufficient documentation

## 2019-04-01 DIAGNOSIS — K644 Residual hemorrhoidal skin tags: Secondary | ICD-10-CM | POA: Insufficient documentation

## 2019-04-01 DIAGNOSIS — Z982 Presence of cerebrospinal fluid drainage device: Secondary | ICD-10-CM | POA: Insufficient documentation

## 2019-04-01 DIAGNOSIS — Q438 Other specified congenital malformations of intestine: Secondary | ICD-10-CM | POA: Diagnosis not present

## 2019-04-01 DIAGNOSIS — E611 Iron deficiency: Secondary | ICD-10-CM

## 2019-04-01 DIAGNOSIS — G4730 Sleep apnea, unspecified: Secondary | ICD-10-CM | POA: Diagnosis not present

## 2019-04-01 DIAGNOSIS — K2970 Gastritis, unspecified, without bleeding: Secondary | ICD-10-CM | POA: Diagnosis not present

## 2019-04-01 DIAGNOSIS — Z79899 Other long term (current) drug therapy: Secondary | ICD-10-CM | POA: Insufficient documentation

## 2019-04-01 DIAGNOSIS — I10 Essential (primary) hypertension: Secondary | ICD-10-CM | POA: Diagnosis not present

## 2019-04-01 DIAGNOSIS — C9110 Chronic lymphocytic leukemia of B-cell type not having achieved remission: Secondary | ICD-10-CM | POA: Diagnosis not present

## 2019-04-01 DIAGNOSIS — E509 Vitamin A deficiency, unspecified: Secondary | ICD-10-CM | POA: Diagnosis not present

## 2019-04-01 DIAGNOSIS — D649 Anemia, unspecified: Secondary | ICD-10-CM

## 2019-04-01 HISTORY — PX: ESOPHAGOGASTRODUODENOSCOPY: SHX5428

## 2019-04-01 HISTORY — PX: COLONOSCOPY: SHX5424

## 2019-04-01 SURGERY — COLONOSCOPY
Anesthesia: Moderate Sedation

## 2019-04-01 MED ORDER — STERILE WATER FOR IRRIGATION IR SOLN
Status: DC | PRN
  Administered 2019-04-01: 2.5 mL

## 2019-04-01 MED ORDER — SODIUM CHLORIDE 0.9 % IV SOLN
INTRAVENOUS | Status: DC
  Administered 2019-04-01: 12:00:00 via INTRAVENOUS

## 2019-04-01 MED ORDER — MIDAZOLAM HCL 5 MG/5ML IJ SOLN
INTRAMUSCULAR | Status: AC
  Filled 2019-04-01: qty 10

## 2019-04-01 MED ORDER — LIDOCAINE VISCOUS HCL 2 % MT SOLN
OROMUCOSAL | Status: AC
  Filled 2019-04-01: qty 15

## 2019-04-01 MED ORDER — MEPERIDINE HCL 100 MG/ML IJ SOLN
INTRAMUSCULAR | Status: DC | PRN
  Administered 2019-04-01 (×4): 25 mg via INTRAVENOUS

## 2019-04-01 MED ORDER — MEPERIDINE HCL 100 MG/ML IJ SOLN
INTRAMUSCULAR | Status: AC
  Filled 2019-04-01: qty 2

## 2019-04-01 MED ORDER — MIDAZOLAM HCL 5 MG/5ML IJ SOLN
INTRAMUSCULAR | Status: DC | PRN
  Administered 2019-04-01 (×2): 2 mg via INTRAVENOUS
  Administered 2019-04-01 (×2): 1 mg via INTRAVENOUS

## 2019-04-01 NOTE — H&P (Signed)
Primary Care Physician:  [name], MD Primary Gastroenterologist:  Dr. Oneida Alar  Pre-Procedure History & Physical: HPI:  David Whitehead is a 48 y.o. male here for NORMOCYTIC ANEMIA, PMHx:CLL.  Past Medical History:  Diagnosis Date  . Cancer ([place])   . Chronic headache   . CLL (chronic lymphocytic leukemia) ([place])   . Diplopia   . Edema 03/14/2019  . Hypertension   . Leukocytosis 04/01/2018  . Lower extremity edema 03/14/2019  . Sleep apnea    uses a cpap    Past Surgical History:  Procedure Laterality Date  . AXILLARY LYMPH NODE DISSECTION Right 04/16/2018   Procedure: AXILLARY LYMPH NODE DEEP EXCISION;  Surgeon: Fanny Skates, MD;  Location: [facility];  Service: General;  Laterality: Right;  . FINGER SURGERY    . VENTRICULOPERITONEAL SHUNT Right 02/18/2019   Procedure: SHUNT INSERTION VENTRICULAR-PERITONEAL;  Surgeon: Ashok Pall, MD;  Location: [facility];  Service: Neurosurgery;  Laterality: Right;  SHUNT INSERTION VENTRICULAR-PERITONEAL    Prior to Admission medications   Medication Sig Start Date End Date Taking? Authorizing Provider  acetaminophen (TYLENOL) 500 MG tablet Take 500 mg by mouth every 6 (six) hours as needed for mild pain.   Yes [provider]  cetirizine (ZYRTEC) 10 MG tablet Take 10 mg by mouth daily.   Yes [provider]  fluconazole (DIFLUCAN) 200 MG tablet Take 2 tablets (400 mg total) by mouth daily. 03/16/19  Yes Tommy Medal, Lavell Islam, MD  hydrOXYzine (ATARAX/VISTARIL) 50 MG tablet Take 1 tablet (50 mg total) by mouth 3 (three) times daily. Patient taking differently: Take 50 mg by mouth 2 (two) times daily.  03/16/19  Yes Tommy Medal, Lavell Islam, MD  Prenatal Vit-Fe Fumarate-FA (PRENATAL MULTIVITAMIN) TABS tablet Take 1 tablet by mouth daily at 12 noon.   Yes [provider]  meclizine (ANTIVERT) 25 MG tablet Take 1 tablet (25 mg total) by mouth 3 (three) times daily as needed for dizziness or nausea. 03/10/19   [name],  MD    Allergies as of 01/20/2019 - Review Complete 01/20/2019  Allergen Reaction Noted  . Bactrim [sulfamethoxazole-trimethoprim] Rash 05/28/2018  . Latex Other (See Comments) 01/20/2019  . Amoxicillin Rash 04/05/2018    Family History  Problem Relation Age of Onset  . Colon cancer Father 29       passed from [place]  . Colon cancer Paternal Uncle 78       passed from [facility]    Social History   Socioeconomic History  . Marital status: Married    Spouse name: [name]  . Number of children: 0  . Years of education: 36  . Highest education level: Not on file  Occupational History  . Not on file  Social Needs  . Financial resource strain: Not very hard  . Food insecurity    Worry: Never true    Inability: Never true  . Transportation needs    Medical: No    Non-medical: No  Tobacco Use  . Smoking status: Never Smoker  . Smokeless tobacco: Never Used  Substance and Sexual Activity  . Alcohol use: Not Currently  . Drug use: Never  . Sexual activity: Yes  Lifestyle  . Physical activity    Days per week: Not on file    Minutes per session: Not on file  . Stress: Not on file  Relationships  . Social Herbalist on phone: Not on file    Gets together: Not on file  Attends religious service: Not on file    Active member of club or organization: Not on file    Attends meetings of clubs or organizations: Not on file    Relationship status: Not on file  . Intimate partner violence    Fear of current or ex partner: Not on file    Emotionally abused: Not on file    Physically abused: Not on file    Forced sexual activity: Not on file  Other Topics Concern  . Not on file  Social History Narrative   Right handed    Lives with wife, [name]   Caffeine use: rare    Review of Systems: See HPI, otherwise negative ROS   Physical Exam: BP 132/86   Pulse 78   Temp 97.7 F (36.5 C) (Oral)   SpO2 100%  General:   Alert,  pleasant and cooperative in NAD Head:   Normocephalic and atraumatic. Neck:  Supple; Lungs:  Clear throughout to auscultation.    Heart:  Regular rate and rhythm. Abdomen:  Soft, nontender and nondistended. Normal bowel sounds, without guarding, and without rebound.   Neurologic:  Alert and  oriented x4;  grossly normal neurologically.  Impression/Plan:    Anemia  PLAN:  1. TCS/EGD TODAY. DISCUSSED PROCEDURE, BENEFITS, & RISKS: < 1% chance of medication reaction, bleeding, perforation, ASPIRATION, or rupture of spleen/liver requiring surgery to fix it and missed polyps < 1 cm 10-20% of the time.

## 2019-04-01 NOTE — Op Note (Signed)
[facility] Patient Name: David Whitehead Procedure Date: 04/01/2019 2:01 PM MRN: [id number] Date of Birth: 1971/02/17 Attending MD: Barney Drain MD, MD CSN: [id number] Age: 48 Admit Type: Outpatient Procedure:                Upper GI endoscopy WITH COLD FORCEPS BIOPSY Indications:              Anemia Providers:                Barney Drain MD, MD, Lurline Del, RN, Randa Spike, Technician Referring MD:             [name], MD Medicines:                TCS + Meperidine 25 mg IV, Midazolam 1 mg IV Complications:            No immediate complications. Estimated Blood Loss:     Estimated blood loss was minimal. Procedure:                Pre-Anesthesia Assessment:                           - Prior to the procedure, a History and Physical                            was performed, and patient medications and                            allergies were reviewed. The patient's tolerance of                            previous anesthesia was also reviewed. The risks                            and benefits of the procedure and the sedation                            options and risks were discussed with the patient.                            All questions were answered, and informed consent                            was obtained. Prior Anticoagulants: The patient has                            taken no previous anticoagulant or antiplatelet                            agents. ASA Grade Assessment: II - A patient with                            mild systemic disease. After reviewing the risks  and benefits, the patient was deemed in                            satisfactory condition to undergo the procedure.                            After obtaining informed consent, the endoscope was                            passed under direct vision. Throughout the                            procedure, the patient's blood pressure, pulse, and                             oxygen saturations were monitored continuously. The                            [id number] scope was introduced through the                            mouth, and advanced to the second part of duodenum.                            The upper GI endoscopy was somewhat difficult due                            to the patient's agitation. The patient tolerated                            the procedure fairly well. Scope In: 2:07:43 PM Scope Out: 2:11:11 PM Total Procedure Duration: 0 hours 3 minutes 28 seconds  Findings:      The examined esophagus was normal.      Localized mild inflammation characterized by congestion (edema) and       erythema was found in the gastric body and in the gastric antrum.       Biopsies were taken with a cold forceps for Helicobacter pylori testing.      The examined duodenum was normal. Impression:               - ANEMIA LIKELY DUE TO GASTRITIS IN SETTING OF                            THROMBOCYTOPENIA AND/OR FAILURE OF HEMATOPOIESIS                           - MILD Gastritis. Biopsied. Moderate Sedation:      Moderate (conscious) sedation was administered by the endoscopy nurse       and supervised by the endoscopist. The patient's oxygen saturation,       heart rate, blood pressure and response to care were monitored. Total       physician intraservice time was 41 minutes. Recommendation:           - Patient has a contact number available for  emergencies. The signs and symptoms of potential                            delayed complications were discussed with the                            patient. Return to normal activities tomorrow.                            Written discharge instructions were provided to the                            patient.                           - Resume previous diet.                           - Continue present medications.                           - Await pathology  results.                           - Return to GI office in 6 months. Procedure Code(s):        --- Professional ---                           [phone], Esophagogastroduodenoscopy, flexible,                            transoral; with biopsy, single or multiple                           99153, Moderate sedation; each additional 15                            minutes intraservice time                           99153, Moderate sedation; each additional 15                            minutes intraservice time                           G0500, Moderate sedation services provided by the                            same physician or other qualified health care                            professional performing a gastrointestinal                            endoscopic service that sedation supports,  requiring the presence of an independent trained                            observer to assist in the monitoring of the                            patient's level of consciousness and physiological                            status; initial 15 minutes of intra-service time;                            patient age 59 years or older (additional time may                            be reported with [phone], as appropriate) Diagnosis Code(s):        --- Professional ---                           K29.70, Gastritis, unspecified, without bleeding                           D64.9, Anemia, unspecified CPT copyright 2019 American Medical Association. All rights reserved. The codes documented in this report are preliminary and upon coder review may  be revised to meet current compliance requirements. Barney Drain, MD Barney Drain MD, MD 04/01/2019 2:33:07 PM This report has been signed electronically. Number of Addenda: 0

## 2019-04-01 NOTE — Op Note (Signed)
[facility] Patient Name: David Whitehead Procedure Date: 04/01/2019 1:08 PM MRN: [id number] Date of Birth: March 13, 1971 Attending MD: Barney Drain MD, MD CSN: [id number] Age: 48 Admit Type: Outpatient Procedure:                Colonoscopy, DIAGNOSTIC Indications:              Unexplained iron deficiency anemia Providers:                Barney Drain MD, MD, Lurline Del, RN, Randa Spike, Technician Referring MD:             [name], MD Medicines:                Meperidine 75 mg IV, Midazolam 5 mg IV Complications:            No immediate complications. Estimated Blood Loss:     Estimated blood loss: none. Procedure:                Pre-Anesthesia Assessment:                           - Prior to the procedure, a History and Physical                            was performed, and patient medications and                            allergies were reviewed. The patient's tolerance of                            previous anesthesia was also reviewed. The risks                            and benefits of the procedure and the sedation                            options and risks were discussed with the patient.                            All questions were answered, and informed consent                            was obtained. Prior Anticoagulants: The patient has                            taken no previous anticoagulant or antiplatelet                            agents. ASA Grade Assessment: II - A patient with                            mild systemic disease. After reviewing the risks  and benefits, the patient was deemed in                            satisfactory condition to undergo the procedure.                            After obtaining informed consent, the colonoscope                            was passed under direct vision. Throughout the                            procedure, the patient's blood pressure, pulse, and                      oxygen saturations were monitored continuously. The                            [id number] scope was introduced through                            the anus and advanced to the 10 cm into the ileum.                            The colonoscopy was somewhat difficult due to a                            tortuous colon. Successful completion of the                            procedure was aided by straightening and shortening                            the scope to obtain bowel loop reduction and                            COLOWRAP. The patient tolerated the procedure well.                            The quality of the bowel preparation was excellent.                            The terminal ileum, ileocecal valve, appendiceal                            orifice, and rectum were photographed. Scope In: 1:42:25 PM Scope Out: 1:55:41 PM Scope Withdrawal Time: 0 hours 11 minutes 7 seconds  Total Procedure Duration: 0 hours 13 minutes 16 seconds  Findings:      The terminal ileum appeared normal.      The colon (entire examined portion) appeared normal.      The recto-sigmoid colon and sigmoid colon were mildly tortuous.      External hemorrhoids were found. Impression:               -  The examined portion of the ileum was normal.                           - The entire examined colon is normal.                           - Tortuous colon.                           - External hemorrhoids.                           - NO SOURCE FOR ANEMIA IDENTIFIED                           . Moderate Sedation:      Moderate (conscious) sedation was administered by the endoscopy nurse       and supervised by the endoscopist. The following parameters were       monitored: oxygen saturation, heart rate, blood pressure, and response       to care. Total physician intraservice time was 41 minutes. Recommendation:           - Patient has a contact number available for                             emergencies. The signs and symptoms of potential                            delayed complications were discussed with the                            patient. Return to normal activities tomorrow.                            Written discharge instructions were provided to the                            patient.                           - High fiber diet.                           - Continue present medications.                           - Repeat colonoscopy in 10 years for surveillance.                           - Return to GI office in 6 months. Procedure Code(s):        --- Professional ---                           [phone], Colonoscopy, flexible; diagnostic, including                            collection  of specimen(s) by brushing or washing,                            when performed (separate procedure)                           99153, Moderate sedation; each additional 15                            minutes intraservice time                           99153, Moderate sedation; each additional 15                            minutes intraservice time                           G0500, Moderate sedation services provided by the                            same physician or other qualified health care                            professional performing a gastrointestinal                            endoscopic service that sedation supports,                            requiring the presence of an independent trained                            observer to assist in the monitoring of the                            patient's level of consciousness and physiological                            status; initial 15 minutes of intra-service time;                            patient age 48 years or older (additional time may                            be reported with [phone], as appropriate) Diagnosis Code(s):        --- Professional ---                           K64.4, Residual hemorrhoidal skin tags                            D50.9, Iron deficiency anemia, unspecified                           Q43.8, Other specified  congenital malformations of                            intestine CPT copyright 2019 [employer]. All rights reserved. The codes documented in this report are preliminary and upon coder review may  be revised to meet current compliance requirements. Barney Drain, MD Barney Drain MD, MD 04/01/2019 [id number] PM This report has been signed electronically. Number of Addenda: 0

## 2019-04-01 NOTE — Discharge Instructions (Signed)
No source for your LOW BLOOD COUNT(ANEMIA) was identified. You have MILD gastritis.  I biopsied your stomach.   DRINK WATER TO KEEP YOUR URINE LIGHT YELLOW.  FOLLOW A HIGH FIBER DIET. AVOID ITEMS THAT CAUSE BLOATING. SEE INFO BELOW.   YOUR BIOPSY RESULTS WILL BE BACK IN 5 BUSINESS DAYS.  REPEAT CBC IN 3 MOS.   Next colonoscopy in 10 years BECAUSE YOUR DAD HAD COLON CANCER AFTER AGE 46 AND YOU DID NOT HAVE POLYPS.   ENDOSCOPY Care After Read the instructions outlined below and refer to this sheet in the next week. These discharge instructions provide you with general information on caring for yourself after you leave the hospital. While your treatment has been planned according to the most current medical practices available, unavoidable complications occasionally occur. If you have any problems or questions after discharge, call DR. Oris Calmes, [phone].  ACTIVITY  You may resume your regular activity, but move at a slower pace for the next 24 hours.   Take frequent rest periods for the next 24 hours.   Walking will help get rid of the air and reduce the bloated feeling in your belly (abdomen).   No driving for 24 hours (because of the medicine (anesthesia) used during the test).   You may shower.   Do not sign any important legal documents or operate any machinery for 24 hours (because of the anesthesia used during the test).    NUTRITION  Drink plenty of fluids.   You may resume your normal diet as instructed by your doctor.   Begin with a light meal and progress to your normal diet. Heavy or fried foods are harder to digest and may make you feel sick to your stomach (nauseated).   Avoid alcoholic beverages for 24 hours or as instructed.    MEDICATIONS  You may resume your normal medications.   WHAT YOU CAN EXPECT TODAY  Some feelings of bloating in the abdomen.   Passage of more gas than usual.   Spotting of blood in your stool or on the toilet paper  .    IF YOU HAD POLYPS REMOVED DURING THE ENDOSCOPY:  Eat a soft diet IF YOU HAVE NAUSEA, BLOATING, ABDOMINAL PAIN, OR VOMITING.    FINDING OUT THE RESULTS OF YOUR TEST Not all test results are available during your visit. DR. Oneida Alar WILL CALL YOU WITHIN 14 DAYS OF YOUR PROCEDUE WITH YOUR RESULTS. Do not assume everything is normal if you have not heard from DR. Draper Gallon IN TWO WEEKS, CALL HER OFFICE AT [phone].  SEEK IMMEDIATE MEDICAL ATTENTION AND CALL THE OFFICE: [phone] IF:  You have more than a spotting of blood in your stool.   Your belly is swollen (abdominal distention).   You are nauseated or vomiting.   You have a temperature over 101F.   You have abdominal pain or discomfort that is severe or gets worse throughout the day.   Gastritis  Gastritis is an inflammation (the body's way of reacting to injury and/or infection) of the stomach. It is often caused by viral or bacterial (germ) infections. It can also be caused BY ALCOHOL, ASPIRIN, BC/GOODY POWDER'S, (IBUPROFEN) MOTRIN, OR ALEVE (NAPROXEN), chemicals (including alcohol), SPICY FOODS, and medications. This illness may be associated with generalized malaise (feeling tired, not well), UPPER ABDOMINAL STOMACH cramps, and fever. One common bacterial cause of gastritis is an organism known as H. Pylori. This can be treated with antibiotics.    High-Fiber Diet A high-fiber diet changes your normal  diet to include more whole grains, legumes, fruits, and vegetables. Changes in the diet involve replacing refined carbohydrates with unrefined foods. The calorie level of the diet is essentially unchanged. The Dietary Reference Intake (recommended amount) for adult males is 38 grams per day. For adult females, it is 25 grams per day. Pregnant and lactating women should consume 28 grams of fiber per day. Fiber is the intact part of a plant that is not broken down during digestion. Functional fiber is fiber that has been isolated from  the plant to provide a beneficial effect in the body.  PURPOSE Increase stool bulk.  Ease and regulate bowel movements.  Lower cholesterol.  REDUCE RISK OF COLON CANCER  INDICATIONS THAT YOU NEED MORE FIBER Constipation and hemorrhoids.  Uncomplicated diverticulosis (intestine condition) and irritable bowel syndrome.  Weight management.  As a protective measure against hardening of the arteries (atherosclerosis), diabetes, and cancer.   GUIDELINES FOR INCREASING FIBER IN THE DIET Start adding fiber to the diet slowly. A gradual increase of about 5 more grams (2 servings of most fruits or vegetables) per day is best. Too rapid an increase in fiber may result in constipation, flatulence, and bloating.  Drink enough water and fluids to keep your urine clear or pale yellow. Water, juice, or caffeine-free drinks are recommended. Not drinking enough fluid may cause constipation.  Eat a variety of high-fiber foods rather than one type of fiber.  Try to increase your intake of fiber through using high-fiber foods rather than fiber pills or supplements that contain small amounts of fiber.  The goal is to change the types of food eaten. Do not supplement your present diet with high-fiber foods, but replace foods in your present diet.

## 2019-04-05 ENCOUNTER — Other Ambulatory Visit: Payer: Self-pay

## 2019-04-05 ENCOUNTER — Encounter (HOSPITAL_COMMUNITY): Payer: Self-pay | Admitting: Gastroenterology

## 2019-04-06 LAB — SURGICAL PATHOLOGY

## 2019-04-13 ENCOUNTER — Other Ambulatory Visit: Payer: Self-pay

## 2019-04-13 ENCOUNTER — Ambulatory Visit (INDEPENDENT_AMBULATORY_CARE_PROVIDER_SITE_OTHER): Payer: BC Managed Care – PPO | Admitting: Infectious Disease

## 2019-04-13 DIAGNOSIS — N179 Acute kidney failure, unspecified: Secondary | ICD-10-CM

## 2019-04-13 DIAGNOSIS — L270 Generalized skin eruption due to drugs and medicaments taken internally: Secondary | ICD-10-CM

## 2019-04-13 DIAGNOSIS — C9110 Chronic lymphocytic leukemia of B-cell type not having achieved remission: Secondary | ICD-10-CM

## 2019-04-13 DIAGNOSIS — B451 Cerebral cryptococcosis: Secondary | ICD-10-CM

## 2019-04-13 DIAGNOSIS — E611 Iron deficiency: Secondary | ICD-10-CM

## 2019-04-13 NOTE — Progress Notes (Signed)
Virtual Visit via VIDEO ([email or web address])  I connected with David Whitehead on 04/13/19 at 10:45 AM EST by VIDEO interface and verified that I am speaking with the correct person using two identifiers.  Location: Patient: Home Provider: [facility]   I discussed the limitations, risks, security and privacy concerns of performing an evaluation and management service by VIDEOand the availability of in person appointments. The patient expressed understanding and agreed to proceed.    Chief complaint: Still has some pruritus Subjective:    Patient ID: David Whitehead, male    DOB: Jun 22, 1970, 48 y.o.   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  He underwent VP shunt placement by Dr. Herold Harms while in the hospital for control of his high intracranial pressures.  He completed nearly a month of amphotericin with flucytosine.  Towards the end of his month of therapy he did develop a drug rash and acute kidney injury both attributed to the amphotericin.  He was given prednisone for 3 days and still had the rash at discharge.  He had follow-up labs as an outpatient with his primary care physician which showed his creatinine was still around the same range as it was when he was discharged from the hospital.  He thought the rash was getting worse but actually since he was seen by the PCP it is actually been improving.  Per Dr. [name] the rash was largely on his flank and torso but extended to his extremities when it was at its worse.  The patient confirmed this and showed me that the rash had disappeared from his lower extremities and his back.  It also dissappeared from his torso.  T  I last saw him in clinic in early November and he was having result resolution of this rash.  Kidney function was stable and has improved on subsequent measurements.  He has seen his oncologist who is contemplating  various therapies that could be put into play in the future should the CLL need to be treated again but at present is not have criteria requiring treatment.  There is contemplation for bone marrow biopsy.  The patient also underwent upper and lower endoscopy due to anemia.  He does tell me he has a first-degree relative namely his father who died of colon cancer.  Is tolerating his fluconazole without problems he does not have other hepatotoxins such as alcohol that he ingests.  Has had no return of his headaches and is feeling relatively well.    Past Medical History:  Diagnosis Date  . Cancer ([place])   . Chronic headache   . CLL (chronic lymphocytic leukemia) ([place])   . Diplopia   . Edema 03/14/2019  . Hypertension   . Leukocytosis 04/01/2018  . Lower extremity edema 03/14/2019  . Sleep apnea    uses a cpap    Past Surgical History:  Procedure Laterality Date  . AXILLARY LYMPH NODE DISSECTION Right 04/16/2018   Procedure: AXILLARY LYMPH NODE DEEP EXCISION;  Surgeon: Fanny Skates, MD;  Location: [place];  Service: General;  Laterality: Right;  . COLONOSCOPY N/A 04/01/2019   Procedure: COLONOSCOPY;  Surgeon: Danie Binder, MD;  [facility];  Service: Endoscopy;  Laterality: N/A;  12:45pm  . ESOPHAGOGASTRODUODENOSCOPY N/A 04/01/2019   Procedure: ESOPHAGOGASTRODUODENOSCOPY (EGD);  Surgeon: Danie Binder, MD;  [facility];  Service: Endoscopy;  Laterality: N/A;  . FINGER SURGERY    .  VENTRICULOPERITONEAL SHUNT Right 02/18/2019   Procedure: SHUNT INSERTION VENTRICULAR-PERITONEAL;  Surgeon: Ashok Pall, MD;  Location: [place];  Service: Neurosurgery;  Laterality: Right;  SHUNT INSERTION VENTRICULAR-PERITONEAL    Family History  Problem Relation Age of Onset  . Colon cancer Father 100       passed from [place]  . Colon cancer Paternal Uncle 6       passed from [facility]      Social History   Socioeconomic History  . Marital status: Married    Spouse name: [name]   . Number of children: 0  . Years of education: 54  . Highest education level: Not on file  Occupational History  . Not on file  Social Needs  . Financial resource strain: Not very hard  . Food insecurity    Worry: Never true    Inability: Never true  . Transportation needs    Medical: No    Non-medical: No  Tobacco Use  . Smoking status: Never Smoker  . Smokeless tobacco: Never Used  Substance and Sexual Activity  . Alcohol use: Not Currently  . Drug use: Never  . Sexual activity: Yes  Lifestyle  . Physical activity    Days per week: Not on file    Minutes per session: Not on file  . Stress: Not on file  Relationships  . Social Herbalist on phone: Not on file    Gets together: Not on file    Attends religious service: Not on file    Active member of club or organization: Not on file    Attends meetings of clubs or organizations: Not on file    Relationship status: Not on file  Other Topics Concern  . Not on file  Social History Narrative   Right handed    Lives with wife, [name]   Caffeine use: rare    Allergies  Allergen Reactions  . Bactrim [Sulfamethoxazole-Trimethoprim] Rash  . Latex Other (See Comments)    Took skin off when removed tape  . Amoxicillin Rash    Has patient had a PCN reaction causing immediate rash, facial/tongue/throat swelling, SOB or lightheadedness with hypotension: No Has patient had a PCN reaction causing severe rash involving mucus membranes or skin necrosis: No Has patient had a PCN reaction that required hospitalization: No Has patient had a PCN reaction occurring within the last 10 years: No If all of the above answers are "NO", then may proceed with Cephalosporin use.      Current Outpatient Medications:  .  acetaminophen (TYLENOL) 500 MG tablet, Take 500 mg by mouth every 6 (six) hours as needed for mild pain., Disp: , Rfl:  .  cetirizine (ZYRTEC) 10 MG tablet, Take 10 mg by mouth daily., Disp: , Rfl:  .  fluconazole  (DIFLUCAN) 200 MG tablet, Take 2 tablets (400 mg total) by mouth daily., Disp: 60 tablet, Rfl: 11 .  hydrOXYzine (ATARAX/VISTARIL) 50 MG tablet, Take 1 tablet (50 mg total) by mouth 3 (three) times daily. (Patient taking differently: Take 50 mg by mouth 2 (two) times daily. ), Disp: 30 tablet, Rfl: 2 .  Prenatal Vit-Fe Fumarate-FA (PRENATAL MULTIVITAMIN) TABS tablet, Take 1 tablet by mouth daily at 12 noon., Disp: , Rfl:    Review of Systems  Constitutional: Negative for activity change, appetite change, chills, diaphoresis, fatigue, fever and unexpected weight change.  HENT: Negative for congestion, rhinorrhea, sinus pressure, sneezing, sore throat and trouble swallowing.   Eyes: Negative for photophobia and  visual disturbance.  Respiratory: Negative for cough, chest tightness, shortness of breath, wheezing and stridor.   Cardiovascular: Negative for chest pain, palpitations and leg swelling.  Gastrointestinal: Negative for abdominal distention, abdominal pain, anal bleeding, blood in stool, constipation, diarrhea and vomiting.  Genitourinary: Negative for difficulty urinating, dysuria, flank pain and hematuria.  Musculoskeletal: Negative for arthralgias, back pain, gait problem, joint swelling and myalgias.  Skin: Negative for color change, pallor, rash and wound.  Neurological: Negative for dizziness, tremors, weakness and light-headedness.  Hematological: Negative for adenopathy. Does not bruise/bleed easily.  Psychiatric/Behavioral: Negative for agitation, behavioral problems, confusion, decreased concentration, dysphoric mood and sleep disturbance.      On video he appeared in no acute distress was normocephalic atraumatic with extraocular movement intact he was oriented x3.         Assessment & Plan:   Cryptococcal meningitis: Continue dose of fluconazole 400 mg as part of his consolidative therapy through December 28 and then go down to 200 mg and make sure that he completes a year  total of therapy between his amphotericin induction fluconazole consolidation and maintenance.  Acute kidney injury due to amphotericin: Creatinine has been improving steadily I have Unseld him on avoidance of nephrotoxins.  Drug rash: Does indeed appear to have been due to amphotericin and has resolved though he still has residual pruritus   CLL: Being followed by oncology and not having plans at present to institute therapy yet.  Colon cancer screening: If he indeed has a first-degree relative with colon cancer he  should have screening colonoscopies every 5 years not every 10 years--which he claims he was told was the next time he needed a colonoscopy  I spent greater than 25 minutes with the patient including greater than 50% of time in face to face counsel of the patient regarding his cryptococcal meningitis and how we would treated his rash is acute kidney injury review of all of his labs and studies and in coordination of his care.     I discussed the assessment and treatment plan with the patient. The patient was provided an opportunity to ask questions and all were answered. The patient agreed with the plan and demonstrated an understanding of the instructions.   The patient was advised to call back or seek an in-person evaluation if the symptoms worsen or if the condition fails to improve as anticipated.  Alcide Evener, MD

## 2019-04-26 ENCOUNTER — Other Ambulatory Visit: Payer: Self-pay

## 2019-04-26 ENCOUNTER — Encounter: Payer: Self-pay | Admitting: Hematology

## 2019-04-26 ENCOUNTER — Inpatient Hospital Stay: Payer: BC Managed Care – PPO | Attending: Hematology

## 2019-04-26 ENCOUNTER — Inpatient Hospital Stay (HOSPITAL_BASED_OUTPATIENT_CLINIC_OR_DEPARTMENT_OTHER): Payer: BC Managed Care – PPO | Admitting: Hematology

## 2019-04-26 VITALS — BP 139/87 | HR 75 | Temp 97.5°F | Resp 18 | Ht 70.0 in | Wt 215.2 lb

## 2019-04-26 DIAGNOSIS — N189 Chronic kidney disease, unspecified: Secondary | ICD-10-CM

## 2019-04-26 DIAGNOSIS — D696 Thrombocytopenia, unspecified: Secondary | ICD-10-CM | POA: Diagnosis not present

## 2019-04-26 DIAGNOSIS — R161 Splenomegaly, not elsewhere classified: Secondary | ICD-10-CM | POA: Diagnosis not present

## 2019-04-26 DIAGNOSIS — R21 Rash and other nonspecific skin eruption: Secondary | ICD-10-CM | POA: Diagnosis not present

## 2019-04-26 DIAGNOSIS — C9110 Chronic lymphocytic leukemia of B-cell type not having achieved remission: Secondary | ICD-10-CM | POA: Insufficient documentation

## 2019-04-26 DIAGNOSIS — R590 Localized enlarged lymph nodes: Secondary | ICD-10-CM | POA: Insufficient documentation

## 2019-04-26 DIAGNOSIS — Z79899 Other long term (current) drug therapy: Secondary | ICD-10-CM | POA: Insufficient documentation

## 2019-04-26 DIAGNOSIS — B451 Cerebral cryptococcosis: Secondary | ICD-10-CM | POA: Insufficient documentation

## 2019-04-26 LAB — CMP (CANCER CENTER ONLY)
ALT: 21 U/L (ref 0–44)
AST: 17 U/L (ref 15–41)
Albumin: 5 g/dL (ref 3.5–5.0)
Alkaline Phosphatase: 53 U/L (ref 38–126)
Anion gap: 7 (ref 5–15)
BUN: 16 mg/dL (ref 6–20)
CO2: 29 mmol/L (ref 22–32)
Calcium: 9.7 mg/dL (ref 8.9–10.3)
Chloride: 105 mmol/L (ref 98–111)
Creatinine: 1.39 mg/dL — ABNORMAL HIGH (ref 0.61–1.24)
GFR, Est AFR Am: >60 mL/min (ref >60)
GFR, Estimated: >60 mL/min (ref >60)
Glucose, Bld: 99 mg/dL (ref 70–99)
Potassium: 4.5 mmol/L (ref 3.5–5.1)
Sodium: 141 mmol/L (ref 135–145)
Total Bilirubin: 0.4 mg/dL (ref 0.3–1.2)
Total Protein: 6.3 g/dL — ABNORMAL LOW (ref 6.5–8.1)

## 2019-04-26 LAB — CBC WITH DIFFERENTIAL (CANCER CENTER ONLY)
Abs Immature Granulocytes: 0.03 10*3/uL (ref 0.00–0.07)
Basophils Absolute: 0.1 10*3/uL (ref 0.0–0.1)
Basophils Relative: 1 %
Eosinophils Absolute: 0.4 10*3/uL (ref 0.0–0.5)
Eosinophils Relative: 5 %
HCT: 42.8 % (ref 39.0–52.0)
Hemoglobin: 13.6 g/dL (ref 13.0–17.0)
Immature Granulocytes: 0 %
Lymphocytes Relative: 38 %
Lymphs Abs: 3.2 10*3/uL (ref 0.7–4.0)
MCH: 25.9 pg — ABNORMAL LOW (ref 26.0–34.0)
MCHC: 31.8 g/dL (ref 30.0–36.0)
MCV: 81.4 fL (ref 80.0–100.0)
Monocytes Absolute: 2.2 10*3/uL — ABNORMAL HIGH (ref 0.1–1.0)
Monocytes Relative: 25 %
Neutro Abs: 2.7 10*3/uL (ref 1.7–7.7)
Neutrophils Relative %: 31 %
Platelet Count: 147 10*3/uL — ABNORMAL LOW (ref 150–400)
RBC: 5.26 MIL/uL (ref 4.22–5.81)
RDW: 12.6 % (ref 11.5–15.5)
WBC Count: 8.8 10*3/uL (ref 4.0–10.5)
nRBC: 0 % (ref 0.0–0.2)

## 2019-04-26 LAB — SAVE SMEAR(SSMR), FOR PROVIDER SLIDE REVIEW

## 2019-04-26 NOTE — Progress Notes (Signed)
[place] OFFICE PROGRESS NOTE  Patient Care Team: [name], MD as PCP - General (Family Medicine) Cordelia Poche, RN as Oncology Nurse Navigator [name], MD as Medical Oncologist (Hematology) Danie Binder, MD as Consulting Physician (Gastroenterology)  HEME/ONC OVERVIEW: 1. CLL with bulky lymphadenopathy; RAI Stage IV, intermediate risk; p53 deletion- -03/2018:   PB flow showed CD5+, CD23+ monoclonal B-cells, c/w CLL; IgHV mutation negative  FISH (PB) positive for KCM(03K91); negative t(11;14), p53 deletion/amplification, and ATM (11q22.3) deletion  CT neck, CAP showed bulky lymphadenopathy in bilateral cervical LN's and throughout the chest, abdomen and pelvis; marked splenomegaly -04/2018: R axillary excision LN biopsy showed CLL/SLL (cyclin D1 neg) -05/2017 - 01/2019: ibrutinib   11/2018: CT CAP showed near normalization of all LN's except R hilar LN ~3.8cm (improving) -On treatment holiday   TREATMENT REGIMEN:  05/14/2018 - 02/02/2019: ibrutinib 446m daily  PERTINENT NON-HEM/ONC PROBLEMS:  1. Cryptococcal meningitis in 01/2019  -S/p 1 month of IV amphotericin; currently on fluconazole   ASSESSMENT & PLAN:  CLL with diffuse lymphadenopathy; RAI Stage IV, intermediate risk   -Ibrutinib discontinued due to cryptococcal meninginitis in 01/2019  -While the patient is completing his antifungal treatment (fluconazole x 1 year), as long as he does not have any evidence of CLL progression, such as progressive lymphadenopathy, worsening anemia or thrombocytopenia, we can monitor his lymphadenopathy and blood counts closely (~q265month. -His last CT was done in 11/2018, and it would be reasonable to obtain CT neck, CAP ~q6m67montho monitor for interval changes.  However, due to his renal dysfunction, IV contrast is contraindicated, and non-contrast CT would have limited value.  Therefore, we can re-assess his renal function at the next visit and determine the  timing of the CT scan.  -If he develops disease progression, venetoclax will be a very reasonable treatment option, as BTK inhibitor can be associated risk of opportunistic infection -In addition, I have ordered quantitative immunoglobulins today; if IgG is less than 500, we can adding prophylactic IVIG 0.4mg45m q4weeks with periodic monitoring of IgG level to ensure appropriate response.  In general, the duration of prophylactic IVIG is 6 months.  While IVIG is usually administered to reduce risk of bacterial infections, it may help reduce the risk of other infection as well.  -I discussed with the patient the rationale for IVIG, as well as some of the potential side effects, including a small risk for anaphylactic reaction.  Patient expressed understanding, and was willing to receive the treatment if needed.  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 IgG, IgA, IgM    Standing Status:   Future    Standing Expiration Date:   05/30/2020    All questions were answered. The patient knows to call the clinic with any problems, questions or concerns. No barriers to learning was detected.  Return in 2  months for labs and clinic follow-up.  [name], MD 04/26/2019 3:49 PM  CHIEF  COMPLAINT: "I am doing alright"  INTERVAL HISTORY: Mr. Full returns clinic for follow-up of Stage IV CLL on treatment holiday.  He is currently taking fluconazole 400 mg daily, with a plan to reduce to 200 mg daily in the near future.  He is tolerating it relatively well without significant side effects.  He still has some residual rash on the bilateral arms from amphotericin related skin toxicity.  He denies any recurrent headaches or constitutional symptoms.  He denies any worsening lymphadenopathy.  He denies any other complaints today.  REVIEW OF SYSTEMS:   Constitutional: ( - ) fevers, ( - )  chills , ( - ) night sweats Eyes: ( - ) blurriness of vision, ( - ) double vision, ( - ) watery eyes Ears, nose, mouth, throat, and face: ( - ) mucositis, ( - ) sore throat Respiratory: ( - ) cough, ( - ) dyspnea, ( - ) wheezes Cardiovascular: ( - ) palpitation, ( - ) chest discomfort, ( - ) lower extremity swelling Gastrointestinal:  ( - ) nausea, ( - ) heartburn, ( - ) change in bowel habits Skin: ( - ) abnormal skin rashes Lymphatics: ( - ) new lymphadenopathy, ( - ) easy bruising Neurological: ( - ) numbness, ( - ) tingling, ( - ) new weaknesses Behavioral/Psych: ( - ) mood change, ( - ) new changes  All other systems were reviewed with the patient and are negative.  SUMMARY OF ONCOLOGIC HISTORY: Oncology History  CLL (chronic lymphocytic leukemia) ([place])  04/01/2018 Initial Diagnosis   CLL (chronic lymphocytic leukemia) ([place])   04/05/2018 Pathology Results   Peripheral Blood Flow Cytometry - MONOCLONAL B-CELL POPULATION IDENTIFIED. - SEE NOTE.   04/05/2018 Pathology Results   PB FISH: No evidence of CCND1-IGH [t(11;14)] gene rearrangement. No evidence of trisomy 11 or gain of 11q. (excludes mantle cell lymphoma) No evidence of trisomy 12.  Mono-allelic deletion of G64Q034 (13q14.3) locus is detected. No evidence of p53 (17p13( deletion or amplification. No evidence of ATM  (11q22.3) deletion.    04/07/2018 Imaging   CT neck w/ contrast: IMPRESSION: Bulky bilateral cervical lymphadenopathy consistent with lymphoproliferative disease/lymphoma.   04/07/2018 Imaging   CT CAP w/ contrast: IMPRESSION: 1. Bulky lymphadenopathy throughout the chest, abdomen and pelvis, as detailed above, highly suspicious for lymphoma. 2. Marked splenomegaly. 3. 9 mm pulmonary nodule within the anterior inferior portion of the RIGHT upper lobe. Subtle low-density nodule within the LEFT lower lobe. These are too small to definitively characterize. Neoplastic pulmonary nodules cannot be excluded. Consider PET-CT for further characterization.   04/16/2018 Surgery   Right axillary LN excisional biopsy   04/16/2018 Pathology Results   (Accession: [id number]) Lymph node for lymphoma, Right Axillary - CHRONIC LYMPHOCYTIC LEUKEMIA/SMALL LYMPHOCYTIC LYMPHOMA.     I have reviewed the past medical history, past surgical history, social history and family history with the patient and they are unchanged from previous note.  ALLERGIES:  is allergic to bactrim [sulfamethoxazole-trimethoprim]; latex; and amoxicillin.  MEDICATIONS:  Current Outpatient Medications  Medication Sig Dispense Refill  . acetaminophen (TYLENOL) 500 MG tablet Take 500 mg by mouth every 6 (six) hours as needed for mild pain.    . cetirizine (ZYRTEC) 10 MG tablet Take 10 mg by mouth daily.    . fluconazole (DIFLUCAN) 200 MG tablet Take 2 tablets (400 mg total) by mouth daily. 60 tablet 11  . hydrOXYzine (ATARAX/VISTARIL) 50  MG tablet Take 1 tablet (50 mg total) by mouth 3 (three) times daily. (Patient taking differently: Take 50 mg by mouth 2 (two) times daily. ) 30 tablet 2  . Prenatal Vit-Fe Fumarate-FA (PRENATAL MULTIVITAMIN) TABS tablet Take 1 tablet by mouth daily at 12 noon.     No current facility-administered medications for this visit.    PHYSICAL EXAMINATION: ECOG PERFORMANCE STATUS: 0 -  Asymptomatic  Today's Vitals   04/26/19 1519  BP: 139/87  Pulse: 75  Resp: 18  Temp: (!) 97.5 F (36.4 C)  TempSrc: Temporal  SpO2: 100%  Weight: 215 lb 3.2 oz (97.6 kg)  Height: '5\' 10"'  (1.778 m)  PainSc: 0-No pain   Body mass index is 30.88 kg/m.  Filed Weights   04/26/19 1519  Weight: 215 lb 3.2 oz (97.6 kg)    GENERAL: alert, no distress and comfortable SKIN: skin color, texture, turgor are normal, no rashes or significant lesions EYES: conjunctiva are pink and non-injected, sclera clear NECK: supple, non-tender LYMPH:  Shotty bilateral cervical adenopathy, R > L; largest R supraclavicular LN measuring ~2cm LUNGS: clear to auscultation with normal breathing effort HEART: regular rate & rhythm and no murmurs and no lower extremity edema ABDOMEN: soft, non-tender, non-distended, normal bowel sounds Musculoskeletal: no cyanosis of digits and no clubbing  PSYCH: alert & oriented x 3, fluent speech  LABORATORY DATA:  I have reviewed the data as listed    Component Value Date/Time   NA 141 04/26/2019 1503   NA 140 03/08/2019 1438   K 4.5 04/26/2019 1503   CL 105 04/26/2019 1503   CO2 29 04/26/2019 1503   GLUCOSE 99 04/26/2019 1503   BUN 16 04/26/2019 1503   BUN 25 (H) 03/08/2019 1438   CREATININE 1.39 (H) 04/26/2019 1503   CALCIUM 9.7 04/26/2019 1503   PROT 6.3 (L) 04/26/2019 1503   PROT 5.7 (L) 03/08/2019 1438   ALBUMIN 5.0 04/26/2019 1503   ALBUMIN 4.2 03/08/2019 1438   ALBUMIN 4.8 02/04/2019 0930   AST 17 04/26/2019 1503   ALT 21 04/26/2019 1503   ALKPHOS 53 04/26/2019 1503   BILITOT 0.4 04/26/2019 1503   GFRNONAA >60 04/26/2019 1503   GFRAA >60 04/26/2019 1503    No results found for: SPEP, UPEP  Lab Results  Component Value Date   WBC 8.8 04/26/2019   NEUTROABS 2.7 04/26/2019   HGB 13.6 04/26/2019   HCT 42.8 04/26/2019   MCV 81.4 04/26/2019   PLT 147 (L) 04/26/2019      Chemistry      Component Value Date/Time   NA 141 04/26/2019 1503   NA  140 03/08/2019 1438   K 4.5 04/26/2019 1503   CL 105 04/26/2019 1503   CO2 29 04/26/2019 1503   BUN 16 04/26/2019 1503   BUN 25 (H) 03/08/2019 1438   CREATININE 1.39 (H) 04/26/2019 1503      Component Value Date/Time   CALCIUM 9.7 04/26/2019 1503   ALKPHOS 53 04/26/2019 1503   AST 17 04/26/2019 1503   ALT 21 04/26/2019 1503   BILITOT 0.4 04/26/2019 1503       RADIOGRAPHIC STUDIES: I have personally reviewed the radiological images as listed below and agreed with the findings in the report. No results found.

## 2019-04-27 ENCOUNTER — Other Ambulatory Visit: Payer: Self-pay | Admitting: Hematology

## 2019-04-27 LAB — IGG, IGA, IGM
IgA: 8 mg/dL — ABNORMAL LOW (ref 90–386)
IgG (Immunoglobin G), Serum: 413 mg/dL — ABNORMAL LOW (ref 603–1613)
IgM (Immunoglobulin M), Srm: <5 mg/dL — ABNORMAL LOW (ref 20–172)

## 2019-04-27 LAB — LACTATE DEHYDROGENASE: LDH: 204 U/L — ABNORMAL HIGH (ref 98–192)

## 2019-04-28 ENCOUNTER — Telehealth: Payer: Self-pay | Admitting: *Deleted

## 2019-04-28 NOTE — Telephone Encounter (Signed)
As noted below by Dr. Maylon Peppers, I informed the patient of his immunoglobulin levels. Dr. Maylon Peppers is planning for IVIG x 6 months. He verbalized understanding and knows of his new schedule. He has MyChart.

## 2019-04-28 NOTE — Telephone Encounter (Signed)
-----   Message from [name], MD sent at 04/27/2019  9:54 AM EST ----- Regarding: RE: IVIG Thank you.  [place]  ----- Message ----- From: Donita Brooks D Sent: 04/27/2019   9:01 AM EST To: [name], MD Subject: RE: IVIG                                       I hve him scheduled and Marzetta Board will call and let him know about appointments  Tonya ----- Message ----- From: [name], MD Sent: 04/27/2019   8:53 AM EST To: Hebert Soho, RN, Riley Lam Subject: IVIG                                           Delrae Sawyers,  Can you let Mr. Neitzke know that his immunoglobulin levels are low, so I am adding IVIG? We will plan for monthly IVIG x 6 months.Tonya, can you schedule infusion appt, monthly x 6? The orders are in, starting in late 04/2019 (okay to start after Christmas).  Thanks.  [place]

## 2019-04-28 NOTE — Telephone Encounter (Signed)
-----   Message from [name], MD sent at 04/27/2019  9:54 AM EST ----- Regarding: RE: IVIG Thank you.  [place]  ----- Message ----- From: Donita Brooks D Sent: 04/27/2019   9:01 AM EST To: [name], MD Subject: RE: IVIG                                       I hve him scheduled and Marzetta Board will call and let him know about appointments  Tonya ----- Message ----- From: [name], MD Sent: 04/27/2019   8:53 AM EST To: Hebert Soho, RN, Riley Lam Subject: IVIG                                           Delrae Sawyers,  Can you let Mr. Triner know that his immunoglobulin levels are low, so I am adding IVIG? We will plan for monthly IVIG x 6 months.Tonya, can you schedule infusion appt, monthly x 6? The orders are in, starting in late 04/2019 (okay to start after Christmas).  Thanks.  [place]

## 2019-05-09 ENCOUNTER — Telehealth: Payer: Self-pay | Admitting: Hematology

## 2019-05-09 NOTE — Telephone Encounter (Signed)
lmom to inform patient of canceled infusion  appt 12/29 per insurance denial

## 2019-05-10 ENCOUNTER — Ambulatory Visit: Payer: BC Managed Care – PPO

## 2019-05-24 ENCOUNTER — Other Ambulatory Visit: Payer: Self-pay

## 2019-05-24 ENCOUNTER — Encounter: Payer: Self-pay | Admitting: Gastroenterology

## 2019-05-24 ENCOUNTER — Encounter: Payer: Self-pay | Admitting: Nurse Practitioner

## 2019-05-24 ENCOUNTER — Ambulatory Visit (INDEPENDENT_AMBULATORY_CARE_PROVIDER_SITE_OTHER): Payer: BC Managed Care – PPO | Admitting: Nurse Practitioner

## 2019-05-24 VITALS — BP 136/93 | HR 89 | Temp 97.5°F | Ht 70.0 in | Wt 223.2 lb

## 2019-05-24 DIAGNOSIS — E611 Iron deficiency: Secondary | ICD-10-CM | POA: Diagnosis not present

## 2019-05-24 DIAGNOSIS — Z800 Family history of malignant neoplasm of digestive organs: Secondary | ICD-10-CM | POA: Diagnosis not present

## 2019-05-24 NOTE — Assessment & Plan Note (Signed)
History of iron deficiency with intermittent anemia, last CBC in December was normal hemoglobin as per HPI.  He is currently still on prenatal vitamins with iron supplements.  Recommend he continue this.  Follow-up in 6 months.  Call us for any overt GI bleed.

## 2019-05-24 NOTE — Progress Notes (Signed)
Cc'ed to pcp °

## 2019-05-24 NOTE — Assessment & Plan Note (Signed)
Family history of colon cancer in his father who was diagnosed at age 47.  Colonoscopy recently completed and essentially normal other than external hemorrhoids.  Recommendation was to repeat exam in 10 years (2030).  Call for any overt symptoms.

## 2019-05-24 NOTE — Progress Notes (Signed)
Referring Provider: [name], MD Primary Care Physician:  [name], MD Primary GI:  Dr. Oneida Alar  Chief Complaint  Patient presents with  . Anemia    HPI:   David Whitehead is a 49 y.o. male who presents for follow-up.  Patient was last seen in our office 01/20/2019 for iron deficiency anemia.  Initially was referred by oncology.  History of CLL with lymphadenopathy on treatment.  Iron panel consistent with IDA despite denial of overt GI bleed.  Family history of colon cancer in her father.  Labs prior to last visit found low iron at 37, low saturation at 9%, elevated TIBC of 424.  Hemoglobin have been normal and generally low 13 below 14 range.  All CBCs have been hyperchromic.  Last CBC microcytic with an MCV of 79.4.  At his last visit he noted he never had a colonoscopy or EGD, currently on chemotherapy pill which seems to be effective.  Is taking prenatal vitamins for supplementation.  No overt GI complaints.  Recommended colonoscopy and EGD for iron deficiency, continue follow-up with oncology, follow-up in our office in 4 months.  Colonoscopy completed 04/01/2019 which found normal colon, external hemorrhoids, no source for anemia identified.  Recommended repeat colonoscopy in 10 years.  EGD the same day found localized mild inflammation in the gastric impression with anemia likely due to gastritis in the setting of thrombocytopenia and/or failure hematopoiesis.  Stomach status post biopsy.  Surgical pathology found the biopsies to be antral and oxyntic mucosa with mild chronic inflammation.  Recommended no further additional work-up needed, repeat CBC in 3 months.  Most recent CBC completed on 04/26/2019 with a normal hemoglobin at 13.6 (improved from 11.7 on 03/24/2019).  Today he states he's doing well overall. He still takes a multivitamin. He ended up in the hospital in September/October due to cryptococcal meningitis due to chemotherapy. He did well but had elevated  Cr at 2.0; since has come down to 1.39 in December 2020. Denies abdominal pain, N/V, hematochezia, melena, fever, chills, unintentional weight loss. Denies URI or flu-like symptoms. Denies loss of sense of taste or smell. Denies chest pain, dyspnea, dizziness, lightheadedness, syncope, near syncope. Denies any other upper or lower GI symptoms.  He is on Diflucan from the fungal meningitis; planned 8-12 months duration of therapy. Last CMP in December with normal LFTs.  Past Medical History:  Diagnosis Date  . Cancer ([place])   . Chronic headache   . CLL (chronic lymphocytic leukemia) ([place])   . Diplopia   . Edema 03/14/2019  . Hypertension   . Leukocytosis 04/01/2018  . Lower extremity edema 03/14/2019  . Sleep apnea    uses a cpap    Past Surgical History:  Procedure Laterality Date  . AXILLARY LYMPH NODE DISSECTION Right 04/16/2018   Procedure: AXILLARY LYMPH NODE DEEP EXCISION;  Surgeon: Fanny Skates, MD;  Location: [facility];  Service: General;  Laterality: Right;  . COLONOSCOPY N/A 04/01/2019   Procedure: COLONOSCOPY;  Surgeon: Danie Binder, MD;  [facility];  Service: Endoscopy;  Laterality: N/A;  12:45pm  . ESOPHAGOGASTRODUODENOSCOPY N/A 04/01/2019   Procedure: ESOPHAGOGASTRODUODENOSCOPY (EGD);  Surgeon: Danie Binder, MD;  [facility];  Service: Endoscopy;  Laterality: N/A;  . FINGER SURGERY    . VENTRICULOPERITONEAL SHUNT Right 02/18/2019   Procedure: SHUNT INSERTION VENTRICULAR-PERITONEAL;  Surgeon: Ashok Pall, MD;  Location: [facility];  Service: Neurosurgery;  Laterality: Right;  SHUNT INSERTION VENTRICULAR-PERITONEAL    Current  Outpatient Medications  Medication Sig Dispense Refill  . acetaminophen (TYLENOL) 500 MG tablet Take 500 mg by mouth every 6 (six) hours as needed for mild pain.    . cetirizine (ZYRTEC) 10 MG tablet Take 10 mg by mouth daily.    . fluconazole (DIFLUCAN) 200 MG tablet Take 2 tablets (400 mg total) by mouth daily. (Patient  taking differently: Take 200 mg by mouth daily. ) 60 tablet 11  . Prenatal Vit-Fe Fumarate-FA (PRENATAL MULTIVITAMIN) TABS tablet Take 1 tablet by mouth daily at 12 noon.     No current facility-administered medications for this visit.    Allergies as of 05/24/2019 - Review Complete 05/24/2019  Allergen Reaction Noted  . Bactrim [sulfamethoxazole-trimethoprim] Rash 05/28/2018  . Latex Other (See Comments) 01/20/2019  . Amoxicillin Rash 04/05/2018    Family History  Problem Relation Age of Onset  . Colon cancer Father 36       passed from [place]  . Colon cancer Paternal Uncle 75       passed from [facility]    Social History   Socioeconomic History  . Marital status: Married    Spouse name: David Whitehead  . Number of children: 0  . Years of education: 21  . Highest education level: Not on file  Occupational History  . Not on file  Tobacco Use  . Smoking status: Never Smoker  . Smokeless tobacco: Never Used  Substance and Sexual Activity  . Alcohol use: Not Currently  . Drug use: Never  . Sexual activity: Yes  Other Topics Concern  . Not on file  Social History Narrative   Right handed    Lives with wife, David Whitehead   Caffeine use: rare   Social Determinants of Health   Financial Resource Strain: Low Risk   . Difficulty of Paying Living Expenses: Not very hard  Food Insecurity: No Food Insecurity  . Worried About Charity fundraiser in the Last Year: Never true  . Ran Out of Food in the Last Year: Never true  Transportation Needs: No Transportation Needs  . Lack of Transportation (Medical): No  . Lack of Transportation (Non-Medical): No  Physical Activity:   . Days of Exercise per Week: Not on file  . Minutes of Exercise per Session: Not on file  Stress:   . Feeling of Stress : Not on file  Social Connections:   . Frequency of Communication with Friends and Family: Not on file  . Frequency of Social Gatherings with Friends and Family: Not on file  . Attends Religious Services: Not  on file  . Active Member of Clubs or Organizations: Not on file  . Attends Archivist Meetings: Not on file  . Marital Status: Not on file    Review of Systems: General: Negative for anorexia, weight loss, fever, chills, fatigue, weakness. ENT: Negative for hoarseness, difficulty swallowing. CV: Negative for chest pain, angina, palpitations, peripheral edema.  Respiratory: Negative for dyspnea at rest, cough, sputum, wheezing.  GI: See history of present illness. Endo: Negative for unusual weight change.  Heme: Negative for bruising or bleeding. Allergy: Negative for rash or hives.   Physical Exam: BP (!) 136/93   Pulse 89   Temp (!) 97.5 F (36.4 C) (Temporal)   Ht 5\' 10"  (1.778 m)   Wt 223 lb 3.2 oz (101.2 kg)   BMI 32.03 kg/m  General:   Alert and oriented. Pleasant and cooperative. Well-nourished and well-developed.  Eyes:  Without icterus, sclera clear and conjunctiva  pink.  Ears:  Normal auditory acuity. Cardiovascular:  S1, S2 present without murmurs appreciated. Extremities without clubbing or edema. Respiratory:  Clear to auscultation bilaterally. No wheezes, rales, or rhonchi. No distress.  Gastrointestinal:  +BS, soft, non-tender and non-distended. No HSM noted. No guarding or rebound. No masses appreciated.  Rectal:  Deferred  Musculoskalatal:  Symmetrical without gross deformities. Neurologic:  Alert and oriented x4;  grossly normal neurologically. Psych:  Alert and cooperative. Normal mood and affect. Heme/Lymph/Immune: No excessive bruising noted.    05/24/2019 1:38 PM   Disclaimer: This note was dictated with voice recognition software. Similar sounding words can inadvertently be transcribed and may not be corrected upon review.

## 2019-05-24 NOTE — Patient Instructions (Signed)
Your health issues we discussed today were:   Iron deficiency anemia (low blood levels due to low iron): 1. Your last blood cell test looked good 2. Follow-up with hematology/oncology as well as infectious disease. 3. We can access their records when they check labs again in February 4. Call us if you have any obvious bleeding in your stools  Overall I recommend:  1. Continue your current medications 2. Return for follow-up in 6 months 3. Call us if you have any questions or concerns.   Because of recent events of COVID-19 ("Coronavirus"), follow CDC recommendations:  Wash your hand frequently Avoid touching your face Stay away from people who are sick If you have symptoms such as fever, cough, shortness of breath then call your healthcare provider for further guidance If you are sick, STAY AT HOME unless otherwise directed by your healthcare provider. Follow directions from state and national officials regarding staying safe   At [facility] we value your feedback. You may receive a survey about your visit today. Please share your experience as we strive to create trusting relationships with our patients to provide genuine, compassionate, quality care.  We appreciate your understanding and patience as we review any laboratory studies, imaging, and other diagnostic tests that are ordered as we care for you. Our office policy is 5 business days for review of these results, and any emergent or urgent results are addressed in a timely manner for your best interest. If you do not hear from our office in 1 week, please contact us.   We also encourage the use of MyChart, which contains your medical information for your review as well. If you are not enrolled in this feature, an access code is on this after visit summary for your convenience. Thank you for allowing [place] to be involved in your care.  It was great to see you today!  I hope you have a Happy New Year!!

## 2019-05-25 ENCOUNTER — Ambulatory Visit (INDEPENDENT_AMBULATORY_CARE_PROVIDER_SITE_OTHER): Payer: BC Managed Care – PPO | Admitting: Infectious Disease

## 2019-05-25 DIAGNOSIS — C9110 Chronic lymphocytic leukemia of B-cell type not having achieved remission: Secondary | ICD-10-CM

## 2019-05-25 DIAGNOSIS — R9082 White matter disease, unspecified: Secondary | ICD-10-CM

## 2019-05-25 DIAGNOSIS — B451 Cerebral cryptococcosis: Secondary | ICD-10-CM

## 2019-05-25 DIAGNOSIS — R911 Solitary pulmonary nodule: Secondary | ICD-10-CM | POA: Diagnosis not present

## 2019-05-25 MED ORDER — FLUCONAZOLE 200 MG PO TABS: 200.0000 mg | ORAL_TABLET | Freq: Every day | ORAL | 10 refills | Status: DC

## 2019-05-25 NOTE — Progress Notes (Signed)
Virtual Visit via Telephone  I connected with David Whitehead on 05/25/19 at 11:00 AM EST by Telephone interface and verified that I am speaking with the correct person using two identifiers.  Location: Patient: Home Provider: [facility]   I discussed the limitations, risks, security and privacy concerns of performing an evaluation and management service by telephone the availability of in person appointments. The patient expressed understanding and agreed to proceed.    Chief complaint: Still has a bit of rash Subjective:    Patient ID: David Whitehead, male    DOB: 1970-11-02, 49 y.o.   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  He underwent VP shunt placement by Dr. Herold Harms while in the hospital for control of his high intracranial pressures.  He completed nearly a month of amphotericin with flucytosine.  Towards the end of his month of therapy he did develop a drug rash and acute kidney injury both attributed to the amphotericin.  He was given prednisone for 3 days and still had the rash at discharge.  He had follow-up labs as an outpatient with his primary care physician which showed his creatinine was still around the same range as it was when he was discharged from the hospital.  He thought the rash was getting worse but actually since he was seen by the PCP it is actually been improving.  Per Dr. [name] the rash was largely on his flank and torso but extended to his extremities when it was at its worse.  The patient confirmed this and showed me that the rash had disappeared from his lower extremities and his back.  It also dissappeared from his torso.  T  I last saw him in clinic in early November and he was having result resolution of this rash.  Kidney function was stable and has improved on subsequent measurements.  He has seen his oncologist who is contemplating various  therapies that could be put into play in the future should the CLL need to be treated again but at present is not have criteria requiring treatment.  There is contemplation for bone marrow biopsy.  The patient also underwent upper and lower endoscopy due to anemia.  He does tell me he has a first-degree relative namely his father who died of colon cancer.  He is tolerating his fluconazole without problems he does not have other hepatotoxins such as alcohol that he ingests.  He still has a bit of a rash that he thinks is due to fluconazole as he is not taking much other medications at all.  He is seeing oncology and white blood cell counts were stable and so he has not been reinitiated on therapy for CLL.  He is on fluconazole 200 mg daily and doing well.  He had questions about COVID-19 vaccine which I encouraged him to get when he was able to.  Review of systems as in history of present illness otherwise 12 point review of systems negative    Past Medical History:  Diagnosis Date  . Cancer ([place])   . Chronic headache   . CLL (chronic lymphocytic leukemia) ([place])   . Diplopia   . Edema 03/14/2019  . Hypertension   . Leukocytosis 04/01/2018  . Lower extremity edema 03/14/2019  . Sleep apnea    uses a cpap    Past Surgical History:  Procedure Laterality Date  . AXILLARY LYMPH NODE DISSECTION  Right 04/16/2018   Procedure: AXILLARY LYMPH NODE DEEP EXCISION;  Surgeon: Fanny Skates, MD;  Location: [facility];  Service: General;  Laterality: Right;  . COLONOSCOPY N/A 04/01/2019   Procedure: COLONOSCOPY;  Surgeon: Danie Binder, MD;  [facility];  Service: Endoscopy;  Laterality: N/A;  12:45pm  . ESOPHAGOGASTRODUODENOSCOPY N/A 04/01/2019   Procedure: ESOPHAGOGASTRODUODENOSCOPY (EGD);  Surgeon: Danie Binder, MD;  [facility];  Service: Endoscopy;  Laterality: N/A;  . FINGER SURGERY    . VENTRICULOPERITONEAL SHUNT Right 02/18/2019   Procedure: SHUNT INSERTION  VENTRICULAR-PERITONEAL;  Surgeon: Ashok Pall, MD;  Location: [facility];  Service: Neurosurgery;  Laterality: Right;  SHUNT INSERTION VENTRICULAR-PERITONEAL    Family History  Problem Relation Age of Onset  . Colon cancer Father 82       passed from [place]  . Colon cancer Paternal Uncle 53       passed from [facility]      Social History   Socioeconomic History  . Marital status: Married    Spouse name: [name]  . Number of children: 0  . Years of education: 75  . Highest education level: Not on file  Occupational History  . Not on file  Tobacco Use  . Smoking status: Never Smoker  . Smokeless tobacco: Never Used  Substance and Sexual Activity  . Alcohol use: Not Currently  . Drug use: Never  . Sexual activity: Yes  Other Topics Concern  . Not on file  Social History Narrative   Right handed    Lives with wife, [name]   Caffeine use: rare   Social Determinants of Health   Financial Resource Strain: Low Risk   . Difficulty of Paying Living Expenses: Not very hard  Food Insecurity: No Food Insecurity  . Worried About Charity fundraiser in the Last Year: Never true  . Ran Out of Food in the Last Year: Never true  Transportation Needs: No Transportation Needs  . Lack of Transportation (Medical): No  . Lack of Transportation (Non-Medical): No  Physical Activity:   . Days of Exercise per Week: Not on file  . Minutes of Exercise per Session: Not on file  Stress:   . Feeling of Stress : Not on file  Social Connections:   . Frequency of Communication with Friends and Family: Not on file  . Frequency of Social Gatherings with Friends and Family: Not on file  . Attends Religious Services: Not on file  . Active Member of Clubs or Organizations: Not on file  . Attends Archivist Meetings: Not on file  . Marital Status: Not on file    Allergies  Allergen Reactions  . Bactrim [Sulfamethoxazole-Trimethoprim] Rash  . Latex Other (See Comments)    Took skin off when removed  tape  . Amoxicillin Rash    Has patient had a PCN reaction causing immediate rash, facial/tongue/throat swelling, SOB or lightheadedness with hypotension: No Has patient had a PCN reaction causing severe rash involving mucus membranes or skin necrosis: No Has patient had a PCN reaction that required hospitalization: No Has patient had a PCN reaction occurring within the last 10 years: No If all of the above answers are "NO", then may proceed with Cephalosporin use.      Current Outpatient Medications:  .  acetaminophen (TYLENOL) 500 MG tablet, Take 500 mg by mouth every 6 (six) hours as needed for mild pain., Disp: , Rfl:  .  cetirizine (ZYRTEC) 10 MG tablet, Take 10  mg by mouth daily., Disp: , Rfl:  .  fluconazole (DIFLUCAN) 200 MG tablet, Take 2 tablets (400 mg total) by mouth daily. (Patient taking differently: Take 200 mg by mouth daily. ), Disp: 60 tablet, Rfl: 11 .  Prenatal Vit-Fe Fumarate-FA (PRENATAL MULTIVITAMIN) TABS tablet, Take 1 tablet by mouth daily at 12 noon., Disp: , Rfl:    Review of Systems  Constitutional: Negative for activity change, appetite change, chills, diaphoresis, fatigue, fever and unexpected weight change.  HENT: Negative for congestion, rhinorrhea, sinus pressure, sneezing, sore throat and trouble swallowing.   Eyes: Negative for photophobia and visual disturbance.  Respiratory: Negative for cough, chest tightness, shortness of breath, wheezing and stridor.   Cardiovascular: Negative for chest pain, palpitations and leg swelling.  Gastrointestinal: Negative for abdominal distention, abdominal pain, anal bleeding, blood in stool, constipation, diarrhea and vomiting.  Genitourinary: Negative for difficulty urinating, dysuria, flank pain and hematuria.  Musculoskeletal: Negative for arthralgias, back pain, gait problem, joint swelling and myalgias.  Skin: Negative for color change, pallor, rash and wound.  Neurological: Negative for dizziness, tremors,  weakness and light-headedness.  Hematological: Negative for adenopathy. Does not bruise/bleed easily.  Psychiatric/Behavioral: Negative for agitation, behavioral problems, confusion, decreased concentration, dysphoric mood and sleep disturbance.    .         Assessment & Plan:   Cryptococcal meningitis: Continue dose of fluconazole200 mg and make sure that he completes a year total of therapy between his amphotericin induction fluconazole consolidation and maintenance.  Acute kidney injury due to amphotericin: Creatinine has been improving steadily I have and is nearly normal  Drug rash: Seems to have been due to amphotericin but he still has another faint rash that continues but that he is able to tolerate with taking antihistamines  CLL: Being followed by oncology and not having plans at present to institute therapy yet.  Colon cancer screening: Status post screening colonoscopy I agreed with the plan and demonstrated an understanding of the instructions.   The patient was advised to call back or seek an in-person evaluation if the symptoms worsen or if the condition fails to improve as anticipated.  Alcide Evener, MD  I spent greater than 21 minutes with the patient including greater than 50% of time in face to face counsel of the patient and in coordination of their care.

## 2019-06-10 ENCOUNTER — Ambulatory Visit: Payer: BC Managed Care – PPO

## 2019-06-24 ENCOUNTER — Other Ambulatory Visit: Payer: Self-pay | Admitting: *Deleted

## 2019-06-27 ENCOUNTER — Encounter: Payer: Self-pay | Admitting: Hematology

## 2019-06-27 ENCOUNTER — Inpatient Hospital Stay: Payer: BC Managed Care – PPO | Attending: Hematology

## 2019-06-27 ENCOUNTER — Encounter: Payer: Self-pay | Admitting: *Deleted

## 2019-06-27 ENCOUNTER — Inpatient Hospital Stay (HOSPITAL_BASED_OUTPATIENT_CLINIC_OR_DEPARTMENT_OTHER): Payer: BC Managed Care – PPO | Admitting: Hematology

## 2019-06-27 ENCOUNTER — Other Ambulatory Visit: Payer: Self-pay

## 2019-06-27 VITALS — BP 146/90 | HR 92 | Temp 96.8°F | Resp 18 | Ht 70.0 in | Wt 228.0 lb

## 2019-06-27 DIAGNOSIS — Z79899 Other long term (current) drug therapy: Secondary | ICD-10-CM | POA: Diagnosis not present

## 2019-06-27 DIAGNOSIS — D72820 Lymphocytosis (symptomatic): Secondary | ICD-10-CM | POA: Diagnosis not present

## 2019-06-27 DIAGNOSIS — N189 Chronic kidney disease, unspecified: Secondary | ICD-10-CM | POA: Insufficient documentation

## 2019-06-27 DIAGNOSIS — R21 Rash and other nonspecific skin eruption: Secondary | ICD-10-CM | POA: Diagnosis not present

## 2019-06-27 DIAGNOSIS — D696 Thrombocytopenia, unspecified: Secondary | ICD-10-CM | POA: Diagnosis not present

## 2019-06-27 DIAGNOSIS — R590 Localized enlarged lymph nodes: Secondary | ICD-10-CM | POA: Insufficient documentation

## 2019-06-27 DIAGNOSIS — L270 Generalized skin eruption due to drugs and medicaments taken internally: Secondary | ICD-10-CM

## 2019-06-27 DIAGNOSIS — C9110 Chronic lymphocytic leukemia of B-cell type not having achieved remission: Secondary | ICD-10-CM | POA: Insufficient documentation

## 2019-06-27 LAB — CMP (CANCER CENTER ONLY)
ALT: 24 U/L (ref 0–44)
AST: 18 U/L (ref 15–41)
Albumin: 4.9 g/dL (ref 3.5–5.0)
Alkaline Phosphatase: 51 U/L (ref 38–126)
Anion gap: 6 (ref 5–15)
BUN: 21 mg/dL — ABNORMAL HIGH (ref 6–20)
CO2: 30 mmol/L (ref 22–32)
Calcium: 10.5 mg/dL — ABNORMAL HIGH (ref 8.9–10.3)
Chloride: 105 mmol/L (ref 98–111)
Creatinine: 1.31 mg/dL — ABNORMAL HIGH (ref 0.61–1.24)
GFR, Est AFR Am: >60 mL/min (ref >60)
GFR, Estimated: >60 mL/min (ref >60)
Glucose, Bld: 114 mg/dL — ABNORMAL HIGH (ref 70–99)
Potassium: 4.9 mmol/L (ref 3.5–5.1)
Sodium: 141 mmol/L (ref 135–145)
Total Bilirubin: 0.3 mg/dL (ref 0.3–1.2)
Total Protein: 6.4 g/dL — ABNORMAL LOW (ref 6.5–8.1)

## 2019-06-27 LAB — CBC WITH DIFFERENTIAL (CANCER CENTER ONLY)
Band Neutrophils: 0 %
Basophils Absolute: 0.1 10*3/uL (ref 0.0–0.1)
Basophils Relative: 1 %
Eosinophils Absolute: 0.5 10*3/uL (ref 0.0–0.5)
Eosinophils Relative: 4 %
HCT: 49.6 % (ref 39.0–52.0)
Hemoglobin: 16.1 g/dL (ref 13.0–17.0)
Lymphocytes Relative: 47 %
Lymphs Abs: 5.7 10*3/uL — ABNORMAL HIGH (ref 0.7–4.0)
MCH: 25.9 pg — ABNORMAL LOW (ref 26.0–34.0)
MCHC: 32.5 g/dL (ref 30.0–36.0)
MCV: 79.7 fL — ABNORMAL LOW (ref 80.0–100.0)
Monocytes Absolute: 1.8 10*3/uL — ABNORMAL HIGH (ref 0.1–1.0)
Monocytes Relative: 15 %
Neutro Abs: 3.9 10*3/uL (ref 1.7–7.7)
Neutrophils Relative %: 33 %
Platelet Count: 149 10*3/uL — ABNORMAL LOW (ref 150–400)
RBC: 6.22 MIL/uL — ABNORMAL HIGH (ref 4.22–5.81)
RDW: 12.4 % (ref 11.5–15.5)
WBC Count: 12 10*3/uL — ABNORMAL HIGH (ref 4.0–10.5)
nRBC: 0 % (ref 0.0–0.2)

## 2019-06-27 LAB — LACTATE DEHYDROGENASE: LDH: 222 U/L — ABNORMAL HIGH (ref 98–192)

## 2019-06-27 NOTE — Progress Notes (Signed)
[facility] OFFICE PROGRESS NOTE  Patient Care Team: [name], MD as PCP - General (Family Medicine) Cordelia Poche, RN as Oncology Nurse Navigator [name], MD as Medical Oncologist (Hematology) Danie Binder, MD as Consulting Physician (Gastroenterology)  HEME/ONC OVERVIEW: 1. CLL with bulky lymphadenopathy; RAI Stage IV, intermediate risk; p53 deletion- -03/2018:   PB flow showed CD5+, CD23+ monoclonal B-cells, c/w CLL; IgHV mutation negative  FISH (PB) positive for XBL(39Q30); negative t(11;14), p53 deletion/amplification, and ATM (11q22.3) deletion  CT neck, CAP showed bulky lymphadenopathy in bilateral cervical LN's and throughout the chest, abdomen and pelvis; marked splenomegaly -04/2018: R axillary excision LN biopsy showed CLL/SLL (cyclin D1 neg) -05/2017 - 01/2019: ibrutinib   11/2018: CT CAP showed near normalization of all LN's except R hilar LN ~3.8cm (improving) -On treatment holiday due to severe cryptococcal meningitis in 01/2019    TREATMENT REGIMEN:  05/14/2018 - 02/02/2019: ibrutinib 428m daily  07/04/2019 - present: monthly IVIG, plan for 6 treatments   Currently on treatment holiday   PERTINENT NON-HEM/ONC PROBLEMS:  1. Cryptococcal meningitis in 01/2019  -S/p 1 month of IV amphotericin; currently on fluconazole   ASSESSMENT & PLAN:  CLL with diffuse lymphadenopathy; RAI Stage IV, intermediate risk   -Ibrutinib discontinued due to cryptococcal meninginitis in 01/2019  -As his lymphadenopathy has not progressed and his CBC has remained stable, we are holding off resuming CLL-directed therapy until he develops evidence of disease progression, such as worsening lymphadenopathy or cytopenias  -CBC today overall stable, and lymphadenopathy unchanged on exam -We discussed repeating scans to monitor for any change in lymphadenopathy, but due to his mild persistent CKD and absence of clinically suspicious symptoms, CT scan is unlikely to  change the management at this time.  Therefore, we will hold off obtaining scans for now.   -If he develops disease progression, venetoclax will be a very reasonable treatment option, as BTK inhibitor can be associated risk of opportunistic infection -I had also added monthly IVIG x 6 months due to the recent severe fungal infection and his low quantitative immunoglobulin level (IgG ~400).  However, after peer-to-peer review, he was only approved one treatment of IVIG.  We will go ahead and administer the 1st dose, and I have requested another peer-to-peer review with his insurance to request up to 6 treatments. -Finally, I counseled the patient on some of the concerning symptoms, including but not limited to, fever, night sweats, weight loss, lymphadenopathy, or worsening abdominal pain, for which she is instructed to contact the clinic for further evaluation  Thrombocytopenia -Likely multifactorial, including bone marrow suppression from prolonged antifungal therapy and CLL -Plts 149k today, improving -Clinically, patient denies any symptoms of bleeding -We will monitor for now -If he develops worsening thrombocytopenia, he may require bone marrow biopsy as well as a trial of steroid to rule out ITP  Leukocytosis -Most likely due to CLL -WBC 12.0k today, slightly elevated -Patient denies any symptoms of infection -We will monitor closely  Cryptococcal meningitis -Diagnosed in late 01/2019, s/p 4 weeks of IV amphotericin -Currently on high-dose fluconazole 400 mg daily -Plan for a total of 1 year of antifungal therapy (until 12/2019) -Continue follow-up with infectious disease  CKD  -Likely due to nephrotoxic side effect from amphotericin -Cr 1.31 today, improving -Continue follow-up with infectious disease and PCP  Lower extremity rash -Most likely drug-induced, such as fluconazole -Patient denies any skin or mucosal desquamation -Currently on PRN Benadryl (po and topical) -I  encouraged patient  to contact ID for further management, especially if the rash worsens  Orders Placed This Encounter  Procedures  . IgG, IgA, IgM    Standing Status:   Future    Standing Expiration Date:   07/31/2020    The total time spent in the encounter was 35 minutes, including face-to-face time with the patient, review of various tests results, order additional studies/medications, documentation, and coordination of care plan.   All questions were answered. The patient knows to call the clinic with any problems, questions or concerns. No barriers to learning was detected.  Return in 3 months for labs, IVIG infusion, and clinic follow-up.  [name], MD 2/15/202110:58 AM  CHIEF COMPLAINT: "I am doing alright"  INTERVAL HISTORY: [name] returns clinic for follow-up of Stage IV CLL, currently on treatment holiday.  Patient reports that he has had intermittent rash, mostly over the lower extremity, slightly raised, pruritic, for which he takes PRN Benadryl (p.o. and topical) with some relief.  He denies any skin desquamation or mucosal breakdown.  He takes fluconazole 400 mg daily tentatively through the end of 12/2019.  He denies any constitutional symptoms or worsening lymphadenopathy.  He denies any other complaint today.  REVIEW OF SYSTEMS:   Constitutional: ( - ) fevers, ( - )  chills , ( - ) night sweats Eyes: ( - ) blurriness of vision, ( - ) double vision, ( - ) watery eyes Ears, nose, mouth, throat, and face: ( - ) mucositis, ( - ) sore throat Respiratory: ( - ) cough, ( - ) dyspnea, ( - ) wheezes Cardiovascular: ( - ) palpitation, ( - ) chest discomfort, ( - ) lower extremity swelling Gastrointestinal:  ( - ) nausea, ( - ) heartburn, ( - ) change in bowel habits Skin: ( + ) abnormal skin rashes Lymphatics: ( - ) new lymphadenopathy, ( - ) easy bruising Neurological: ( - ) numbness, ( - ) tingling, ( - ) new weaknesses Behavioral/Psych: ( - ) mood change, ( - ) new  changes  All other systems were reviewed with the patient and are negative.  SUMMARY OF ONCOLOGIC HISTORY: Oncology History  CLL (chronic lymphocytic leukemia) ([place])  04/01/2018 Initial Diagnosis   CLL (chronic lymphocytic leukemia) ([place])   04/05/2018 Pathology Results   Peripheral Blood Flow Cytometry - MONOCLONAL B-CELL POPULATION IDENTIFIED. - SEE NOTE.   04/05/2018 Pathology Results   PB FISH: No evidence of CCND1-IGH [t(11;14)] gene rearrangement. No evidence of trisomy 11 or gain of 11q. (excludes mantle cell lymphoma) No evidence of trisomy 12.  Mono-allelic deletion of [id number] (13q14.3) locus is detected. No evidence of p53 (17p13( deletion or amplification. No evidence of ATM (11q22.3) deletion.    04/07/2018 Imaging   CT neck w/ contrast: IMPRESSION: Bulky bilateral cervical lymphadenopathy consistent with lymphoproliferative disease/lymphoma.   04/07/2018 Imaging   CT CAP w/ contrast: IMPRESSION: 1. Bulky lymphadenopathy throughout the chest, abdomen and pelvis, as detailed above, highly suspicious for lymphoma. 2. Marked splenomegaly. 3. 9 mm pulmonary nodule within the anterior inferior portion of the RIGHT upper lobe. Subtle low-density nodule within the LEFT lower lobe. These are too small to definitively characterize. Neoplastic pulmonary nodules cannot be excluded. Consider PET-CT for further characterization.   04/16/2018 Surgery   Right axillary LN excisional biopsy   04/16/2018 Pathology Results   (Accession: [id number]) Lymph node for lymphoma, Right Axillary - CHRONIC LYMPHOCYTIC LEUKEMIA/SMALL LYMPHOCYTIC LYMPHOMA.     I have reviewed the past medical history, past surgical history,  social history and family history with the patient and they are unchanged from previous note.  ALLERGIES:  is allergic to bactrim [sulfamethoxazole-trimethoprim]; latex; and amoxicillin.  MEDICATIONS:  Current Outpatient Medications  Medication Sig Dispense Refill   . acetaminophen (TYLENOL) 500 MG tablet Take 500 mg by mouth every 6 (six) hours as needed for mild pain.    . cetirizine (ZYRTEC) 10 MG tablet Take 10 mg by mouth daily.    . fluconazole (DIFLUCAN) 200 MG tablet Take 1 tablet (200 mg total) by mouth daily. 30 tablet 10  . Prenatal Vit-Fe Fumarate-FA (PRENATAL MULTIVITAMIN) TABS tablet Take 1 tablet by mouth daily at 12 noon.     No current facility-administered medications for this visit.    PHYSICAL EXAMINATION: ECOG PERFORMANCE STATUS: 1 - Symptomatic but completely ambulatory  Today's Vitals   06/27/19 1022  BP: (!) 146/90  Pulse: 92  Resp: 18  Temp: (!) 96.8 F (36 C)  TempSrc: Temporal  SpO2: 100%  Weight: 228 lb (103.4 kg)  Height: '5\' 10"'  (1.778 m)  PainSc: 0-No pain   Body mass index is 32.71 kg/m.  Filed Weights   06/27/19 1022  Weight: 228 lb (103.4 kg)    GENERAL: alert, no distress and comfortable SKIN: mild small areas of confluent erythematous, raised lesion over the right lower extremity, from the ankle to mid-shin, no drainage  EYES: conjunctiva are pink and non-injected, sclera clear OROPHARYNX: no exudate, no erythema; lips, buccal mucosa, and tongue normal  NECK: supple LYMPH:  Shotty bilateral cervical lymphadenopathy, ~1-1.5cm, non-tender, mobile  LUNGS: clear to auscultation with normal breathing effort HEART: regular rate & rhythm and no murmurs and no lower extremity edema ABDOMEN: soft, non-tender, non-distended, normal bowel sounds Musculoskeletal: no cyanosis of digits and no clubbing  PSYCH: alert & oriented x 3, fluent speech  LABORATORY DATA:  I have reviewed the data as listed    Component Value Date/Time   NA 141 06/27/2019 0951   NA 140 03/08/2019 1438   K 4.9 06/27/2019 0951   CL 105 06/27/2019 0951   CO2 30 06/27/2019 0951   GLUCOSE 114 (H) 06/27/2019 0951   BUN 21 (H) 06/27/2019 0951   BUN 25 (H) 03/08/2019 1438   CREATININE 1.31 (H) 06/27/2019 0951   CALCIUM 10.5 (H)  06/27/2019 0951   PROT 6.4 (L) 06/27/2019 0951   PROT 5.7 (L) 03/08/2019 1438   ALBUMIN 4.9 06/27/2019 0951   ALBUMIN 4.2 03/08/2019 1438   ALBUMIN 4.8 02/04/2019 0930   AST 18 06/27/2019 0951   ALT 24 06/27/2019 0951   ALKPHOS 51 06/27/2019 0951   BILITOT 0.3 06/27/2019 0951   GFRNONAA >60 06/27/2019 0951   GFRAA >60 06/27/2019 0951    No results found for: SPEP, UPEP  Lab Results  Component Value Date   WBC 12.0 (H) 06/27/2019   NEUTROABS PENDING 06/27/2019   HGB 16.1 06/27/2019   HCT 49.6 06/27/2019   MCV 79.7 (L) 06/27/2019   PLT 149 (L) 06/27/2019      Chemistry      Component Value Date/Time   NA 141 06/27/2019 0951   NA 140 03/08/2019 1438   K 4.9 06/27/2019 0951   CL 105 06/27/2019 0951   CO2 30 06/27/2019 0951   BUN 21 (H) 06/27/2019 0951   BUN 25 (H) 03/08/2019 1438   CREATININE 1.31 (H) 06/27/2019 0951      Component Value Date/Time   CALCIUM 10.5 (H) 06/27/2019 0951   ALKPHOS 51 06/27/2019 0951  AST 18 06/27/2019 0951   ALT 24 06/27/2019 0951   BILITOT 0.3 06/27/2019 0951       RADIOGRAPHIC STUDIES: I have personally reviewed the radiological images as listed below and agreed with the findings in the report. No results found.

## 2019-06-28 ENCOUNTER — Encounter: Payer: Self-pay | Admitting: *Deleted

## 2019-06-28 LAB — IGG, IGA, IGM
IgA: 6 mg/dL — ABNORMAL LOW (ref 90–386)
IgG (Immunoglobin G), Serum: 429 mg/dL — ABNORMAL LOW (ref 603–1613)
IgM (Immunoglobulin M), Srm: <5 mg/dL — ABNORMAL LOW (ref 20–172)

## 2019-06-28 LAB — FUNGUS CULTURE WITH STAIN

## 2019-07-04 ENCOUNTER — Telehealth: Payer: Self-pay | Admitting: Family Medicine

## 2019-07-04 ENCOUNTER — Ambulatory Visit: Payer: BC Managed Care – PPO | Admitting: Hematology

## 2019-07-04 ENCOUNTER — Encounter: Payer: Self-pay | Admitting: *Deleted

## 2019-07-04 ENCOUNTER — Other Ambulatory Visit: Payer: Self-pay

## 2019-07-04 ENCOUNTER — Inpatient Hospital Stay: Payer: BC Managed Care – PPO

## 2019-07-04 ENCOUNTER — Encounter: Payer: Self-pay | Admitting: Hematology

## 2019-07-04 VITALS — BP 131/84 | HR 71 | Temp 97.6°F | Resp 16

## 2019-07-04 DIAGNOSIS — R21 Rash and other nonspecific skin eruption: Secondary | ICD-10-CM | POA: Diagnosis not present

## 2019-07-04 DIAGNOSIS — C9110 Chronic lymphocytic leukemia of B-cell type not having achieved remission: Secondary | ICD-10-CM | POA: Diagnosis not present

## 2019-07-04 DIAGNOSIS — R590 Localized enlarged lymph nodes: Secondary | ICD-10-CM | POA: Diagnosis not present

## 2019-07-04 DIAGNOSIS — Z79899 Other long term (current) drug therapy: Secondary | ICD-10-CM | POA: Diagnosis not present

## 2019-07-04 DIAGNOSIS — D696 Thrombocytopenia, unspecified: Secondary | ICD-10-CM | POA: Diagnosis not present

## 2019-07-04 DIAGNOSIS — N189 Chronic kidney disease, unspecified: Secondary | ICD-10-CM | POA: Diagnosis not present

## 2019-07-04 MED ORDER — DIPHENHYDRAMINE HCL 25 MG PO TABS
25.0000 mg | ORAL_TABLET | Freq: Once | ORAL | Status: AC
  Administered 2019-07-04: 25 mg via ORAL
  Filled 2019-07-04: qty 1

## 2019-07-04 MED ORDER — ACETAMINOPHEN 325 MG PO TABS
ORAL_TABLET | ORAL | Status: AC
  Filled 2019-07-04: qty 2

## 2019-07-04 MED ORDER — IMMUNE GLOBULIN (HUMAN) 10 GM/100ML IV SOLN
400.0000 mg/kg | INTRAVENOUS | Status: DC
  Administered 2019-07-04: 40 g via INTRAVENOUS
  Filled 2019-07-04: qty 400

## 2019-07-04 MED ORDER — DIPHENHYDRAMINE HCL 25 MG PO CAPS
ORAL_CAPSULE | ORAL | Status: AC
  Filled 2019-07-04: qty 1

## 2019-07-04 MED ORDER — ACETAMINOPHEN 325 MG PO TABS
650.0000 mg | ORAL_TABLET | Freq: Once | ORAL | Status: AC
  Administered 2019-07-04: 650 mg via ORAL

## 2019-07-04 MED ORDER — DEXTROSE 5 % IV SOLN
Freq: Once | INTRAVENOUS | Status: AC
  Filled 2019-07-04: qty 250

## 2019-07-04 NOTE — Telephone Encounter (Signed)
Pt dropped off insurance forms in folder. Forms placed in providers box in office.

## 2019-07-04 NOTE — Patient Instructions (Signed)
Immune Globulin Injection What is this medicine? IMMUNE GLOBULIN (im MUNE GLOB yoo lin) helps to prevent or reduce the severity of certain infections in patients who are at risk. This medicine is collected from the pooled blood of many donors. It is used to treat immune system problems, thrombocytopenia, and Kawasaki syndrome. This medicine may be used for other purposes; ask your health care provider or pharmacist if you have questions. COMMON BRAND NAME(S): ASCENIV, Baygam, BIVIGAM, Carimune, Carimune NF, cutaquig, Cuvitru, Flebogamma, Flebogamma DIF, GamaSTAN, GamaSTAN S/D, Gamimune N, Gammagard, Gammagard S/D, Gammaked, Gammaplex, Gammar-P IV, Gamunex, Gamunex-C, Hizentra, Iveegam, Iveegam EN, Octagam, Panglobulin, Panglobulin NF, panzyga, Polygam S/D, Privigen, Sandoglobulin, Venoglobulin-S, Vigam, Vivaglobulin, Xembify What should I tell my health care provider before I take this medicine? They need to know if you have any of these conditions:  diabetes  extremely low or no immune antibodies in the blood  heart disease  history of blood clots  hyperprolinemia  infection in the blood, sepsis  kidney disease  recently received or scheduled to receive a vaccination  an unusual or allergic reaction to human immune globulin, albumin, maltose, sucrose, other medicines, foods, dyes, or preservatives  pregnant or trying to get pregnant  breast-feeding How should I use this medicine? This medicine is for injection into a muscle or infusion into a vein or skin. It is usually given by a health care professional in a hospital or clinic setting. In rare cases, some brands of this medicine might be given at home. You will be taught how to give this medicine. Use exactly as directed. Take your medicine at regular intervals. Do not take your medicine more often than directed. Talk to your pediatrician regarding the use of this medicine in children. While this drug may be prescribed for selected  conditions, precautions do apply. Overdosage: If you think you have taken too much of this medicine contact a poison control center or emergency room at once. NOTE: This medicine is only for you. Do not share this medicine with others. What if I miss a dose? It is important not to miss your dose. Call your doctor or health care professional if you are unable to keep an appointment. If you give yourself the medicine and you miss a dose, take it as soon as you can. If it is almost time for your next dose, take only that dose. Do not take double or extra doses. What may interact with this medicine?  aspirin and aspirin-like medicines  cisplatin  cyclosporine  medicines for infection like acyclovir, adefovir, amphotericin B, bacitracin, cidofovir, foscarnet, ganciclovir, gentamicin, pentamidine, vancomycin  NSAIDS, medicines for pain and inflammation, like ibuprofen or naproxen  pamidronate  vaccines  zoledronic acid This list may not describe all possible interactions. Give your health care provider a list of all the medicines, herbs, non-prescription drugs, or dietary supplements you use. Also tell them if you smoke, drink alcohol, or use illegal drugs. Some items may interact with your medicine. What should I watch for while using this medicine? Your condition will be monitored carefully while you are receiving this medicine. This medicine is made from pooled blood donations of many different people. It may be possible to pass an infection in this medicine. However, the donors are screened for infections and all products are tested for HIV and hepatitis. The medicine is treated to kill most or all bacteria and viruses. Talk to your doctor about the risks and benefits of this medicine. Do not have vaccinations for at least   14 days before, or until at least 3 months after receiving this medicine. What side effects may I notice from receiving this medicine? Side effects that you should report  to your doctor or health care professional as soon as possible:  allergic reactions like skin rash, itching or hives, swelling of the face, lips, or tongue  blue colored lips or skin  breathing problems  chest pain or tightness  fever  signs and symptoms of aseptic meningitis such as stiff neck; sensitivity to light; headache; drowsiness; fever; nausea; vomiting; rash  signs and symptoms of a blood clot such as chest pain; shortness of breath; pain, swelling, or warmth in the leg  signs and symptoms of hemolytic anemia such as fast heartbeat; tiredness; dark yellow or brown urine; or yellowing of the eyes or skin  signs and symptoms of kidney injury like trouble passing urine or change in the amount of urine  sudden weight gain  swelling of the ankles, feet, hands Side effects that usually do not require medical attention (report to your doctor or health care professional if they continue or are bothersome):  diarrhea  flushing  headache  increased sweating  joint pain  muscle cramps  muscle pain  nausea  pain, redness, or irritation at site where injected  tiredness This list may not describe all possible side effects. Call your doctor for medical advice about side effects. You may report side effects to FDA at 1-800-FDA-1088. Where should I keep my medicine? Keep out of the reach of children. This drug is usually given in a hospital or clinic and will not be stored at home. In rare cases, some brands of this medicine may be given at home. If you are using this medicine at home, you will be instructed on how to store this medicine. Throw away any unused medicine after the expiration date on the label. NOTE: This sheet is a summary. It may not cover all possible information. If you have questions about this medicine, talk to your doctor, pharmacist, or health care provider.  2020 Elsevier/Gold Standard (2018-12-01 12:51:14)  

## 2019-07-05 ENCOUNTER — Ambulatory Visit: Payer: BC Managed Care – PPO | Admitting: Hematology

## 2019-07-06 DIAGNOSIS — Z23 Encounter for immunization: Secondary | ICD-10-CM | POA: Diagnosis not present

## 2019-07-07 NOTE — Telephone Encounter (Signed)
So I filled in these forms as best I could there are several small areas that need to have additional information added No charge for these forms Family can pick these up thank you

## 2019-07-12 ENCOUNTER — Telehealth: Payer: Self-pay | Admitting: *Deleted

## 2019-07-12 NOTE — Telephone Encounter (Signed)
Pt was due for CBC this month but it looks like he had one done recently on 06/27/2019.  Can be reviewed in pt's chart.

## 2019-07-14 NOTE — Telephone Encounter (Signed)
CBC reviewed. Hgb improved/stable. Continue to follow with Heme/Onc

## 2019-07-27 ENCOUNTER — Encounter: Payer: Self-pay | Admitting: *Deleted

## 2019-08-01 ENCOUNTER — Ambulatory Visit: Payer: BC Managed Care – PPO

## 2019-08-25 NOTE — Progress Notes (Signed)
Pharmacist Chemotherapy Monitoring - Follow Up Assessment    I verify that I have reviewed each item in the below checklist:  . Regimen for the patient is scheduled for the appropriate day and plan matches scheduled date. Marland Kitchen Appropriate non-routine labs are ordered dependent on drug ordered. . If applicable, additional medications reviewed and ordered per protocol based on lifetime cumulative doses and/or treatment regimen.   Plan for follow-up and/or issues identified: No . I-vent associated with next due treatment: No . MD and/or nursing notified: No  Ilan Kahrs, Jacqlyn Larsen 08/25/2019 7:53 AM

## 2019-08-29 ENCOUNTER — Other Ambulatory Visit: Payer: Self-pay | Admitting: Hematology

## 2019-08-29 ENCOUNTER — Encounter: Payer: Self-pay | Admitting: Hematology

## 2019-08-29 DIAGNOSIS — C9110 Chronic lymphocytic leukemia of B-cell type not having achieved remission: Secondary | ICD-10-CM

## 2019-09-01 ENCOUNTER — Inpatient Hospital Stay (HOSPITAL_BASED_OUTPATIENT_CLINIC_OR_DEPARTMENT_OTHER): Payer: BC Managed Care – PPO | Admitting: Hematology

## 2019-09-01 ENCOUNTER — Inpatient Hospital Stay: Payer: BC Managed Care – PPO | Attending: Hematology

## 2019-09-01 ENCOUNTER — Other Ambulatory Visit: Payer: Self-pay

## 2019-09-01 ENCOUNTER — Encounter: Payer: Self-pay | Admitting: Hematology

## 2019-09-01 ENCOUNTER — Inpatient Hospital Stay: Payer: BC Managed Care – PPO

## 2019-09-01 ENCOUNTER — Encounter: Payer: Self-pay | Admitting: *Deleted

## 2019-09-01 VITALS — BP 128/90 | HR 85 | Temp 97.7°F | Resp 18 | Ht 70.0 in | Wt 237.0 lb

## 2019-09-01 VITALS — BP 129/85 | HR 70 | Temp 98.6°F | Resp 16

## 2019-09-01 DIAGNOSIS — C9110 Chronic lymphocytic leukemia of B-cell type not having achieved remission: Secondary | ICD-10-CM | POA: Diagnosis not present

## 2019-09-01 DIAGNOSIS — D696 Thrombocytopenia, unspecified: Secondary | ICD-10-CM

## 2019-09-01 DIAGNOSIS — B451 Cerebral cryptococcosis: Secondary | ICD-10-CM | POA: Insufficient documentation

## 2019-09-01 DIAGNOSIS — D801 Nonfamilial hypogammaglobulinemia: Secondary | ICD-10-CM | POA: Diagnosis not present

## 2019-09-01 DIAGNOSIS — D72820 Lymphocytosis (symptomatic): Secondary | ICD-10-CM

## 2019-09-01 DIAGNOSIS — N189 Chronic kidney disease, unspecified: Secondary | ICD-10-CM | POA: Diagnosis not present

## 2019-09-01 DIAGNOSIS — Z79899 Other long term (current) drug therapy: Secondary | ICD-10-CM | POA: Diagnosis not present

## 2019-09-01 LAB — CBC WITH DIFFERENTIAL (CANCER CENTER ONLY)
Abs Immature Granulocytes: 0.06 10*3/uL (ref 0.00–0.07)
Basophils Absolute: 0.2 10*3/uL — ABNORMAL HIGH (ref 0.0–0.1)
Basophils Relative: 1 %
Eosinophils Absolute: 0.7 10*3/uL — ABNORMAL HIGH (ref 0.0–0.5)
Eosinophils Relative: 5 %
HCT: 45.8 % (ref 39.0–52.0)
Hemoglobin: 14.8 g/dL (ref 13.0–17.0)
Immature Granulocytes: 0 %
Lymphocytes Relative: 63 %
Lymphs Abs: 10 10*3/uL — ABNORMAL HIGH (ref 0.7–4.0)
MCH: 25.5 pg — ABNORMAL LOW (ref 26.0–34.0)
MCHC: 32.3 g/dL (ref 30.0–36.0)
MCV: 78.8 fL — ABNORMAL LOW (ref 80.0–100.0)
Monocytes Absolute: 2.4 10*3/uL — ABNORMAL HIGH (ref 0.1–1.0)
Monocytes Relative: 15 %
Neutro Abs: 2.6 10*3/uL (ref 1.7–7.7)
Neutrophils Relative %: 16 %
Platelet Count: 121 10*3/uL — ABNORMAL LOW (ref 150–400)
RBC: 5.81 MIL/uL (ref 4.22–5.81)
RDW: 13.9 % (ref 11.5–15.5)
WBC Count: 15.9 10*3/uL — ABNORMAL HIGH (ref 4.0–10.5)
nRBC: 0 % (ref 0.0–0.2)

## 2019-09-01 LAB — CMP (CANCER CENTER ONLY)
ALT: 21 U/L (ref 0–44)
AST: 17 U/L (ref 15–41)
Albumin: 4.7 g/dL (ref 3.5–5.0)
Alkaline Phosphatase: 52 U/L (ref 38–126)
Anion gap: 7 (ref 5–15)
BUN: 21 mg/dL — ABNORMAL HIGH (ref 6–20)
CO2: 24 mmol/L (ref 22–32)
Calcium: 9.6 mg/dL (ref 8.9–10.3)
Chloride: 109 mmol/L (ref 98–111)
Creatinine: 1.42 mg/dL — ABNORMAL HIGH (ref 0.61–1.24)
GFR, Est AFR Am: >60 mL/min (ref >60)
GFR, Estimated: 59 mL/min — ABNORMAL LOW (ref >60)
Glucose, Bld: 115 mg/dL — ABNORMAL HIGH (ref 70–99)
Potassium: 4.2 mmol/L (ref 3.5–5.1)
Sodium: 140 mmol/L (ref 135–145)
Total Bilirubin: 0.4 mg/dL (ref 0.3–1.2)
Total Protein: 6.4 g/dL — ABNORMAL LOW (ref 6.5–8.1)

## 2019-09-01 LAB — LACTATE DEHYDROGENASE: LDH: 204 U/L — ABNORMAL HIGH (ref 98–192)

## 2019-09-01 LAB — SAVE SMEAR(SSMR), FOR PROVIDER SLIDE REVIEW

## 2019-09-01 MED ORDER — ACETAMINOPHEN 325 MG PO TABS
ORAL_TABLET | ORAL | Status: AC
  Filled 2019-09-01: qty 2

## 2019-09-01 MED ORDER — DEXTROSE 5 % IV SOLN
INTRAVENOUS | Status: DC
  Filled 2019-09-01: qty 250

## 2019-09-01 MED ORDER — DIPHENHYDRAMINE HCL 25 MG PO CAPS
25.0000 mg | ORAL_CAPSULE | Freq: Once | ORAL | Status: AC
  Administered 2019-09-01: 25 mg via ORAL

## 2019-09-01 MED ORDER — IMMUNE GLOBULIN (HUMAN) 10 GM/100ML IV SOLN
40.0000 g | INTRAVENOUS | Status: DC
  Administered 2019-09-01: 40 g via INTRAVENOUS
  Filled 2019-09-01: qty 400

## 2019-09-01 MED ORDER — ACETAMINOPHEN 325 MG PO TABS
650.0000 mg | ORAL_TABLET | Freq: Once | ORAL | Status: AC
  Administered 2019-09-01: 650 mg via ORAL

## 2019-09-01 MED ORDER — DIPHENHYDRAMINE HCL 25 MG PO CAPS
ORAL_CAPSULE | ORAL | Status: AC
  Filled 2019-09-01: qty 1

## 2019-09-01 NOTE — Progress Notes (Signed)
[facility] OFFICE PROGRESS NOTE  Patient Care Team: [name], MD as PCP - General (Family Medicine) Cordelia Poche, RN as Oncology Nurse Navigator [name], MD as Medical Oncologist (Hematology) Danie Binder, MD as Consulting Physician (Gastroenterology)  HEME/ONC OVERVIEW: 1. Stage IV CLL with bulky lymphadenopathy, intermediate risk; p53 deletion+ -03/2018:   PB flow showed CD5+, CD23+ monoclonal B-cells, c/w CLL; IgHV mutation negative  FISH (PB) positive for XNT(70Y17); negative t(11;14), p53 deletion/amplification, and ATM (11q22.3) deletion  CT neck, CAP showed bulky lymphadenopathy in bilateral cervical LN's and throughout the chest, abdomen and pelvis; marked splenomegaly -04/2018: R axillary excision LN biopsy showed CLL/SLL (cyclin D1 neg) -05/2017 - 01/2019: ibrutinib   11/2018: CT CAP showed near normalization of all LN's except R hilar LN ~3.8cm (improving)  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 Cryptococcal meningitis in 01/2019  -S/p 1 month of IV amphotericin; currently on fluconazole until 12/2019   ASSESSMENT & PLAN:  Stage IV CLL with bulky lymphadenopathy, intermediate risk; p53 deletion+ -Ibrutinib discontinued due to cryptococcal meninginitis in 01/2019  -Since stopping ibrutinib, he has not developed recurrent lymphadenopathy and his CBC has remained stable.  Therefore, we are holding off resuming CLL-directed therapy until he develops evidence of disease progression, such as worsening lymphadenopathy or cytopenias  -If he develops disease progression, venetoclax will be a very reasonable treatment option, as BTK inhibitor (either ibrutinib or acalabrutinib) can be associated risk of  opportunistic infection.  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  I have reached out to a home health company to see if it would be able to set up home infusion.   -Plan for a total of 6 doses.  -Goal IgG level > 500   Thrombocytopenia -Likely multifactorial, including bone marrow suppression from prolonged antifungal therapy and CLL -Plts 121k today, stable  -Clinically, patient denies any symptoms of bleeding -We will monitor for now -If he develops worsening thrombocytopenia, he may require bone marrow biopsy as well as a trial of steroid to rule out ITP  Leukocytosis -Most likely due to CLL -WBC 15.9k today, stable -Patient denies any symptoms of infection -We will monitor closely  CKD  -Likely due to nephrotoxic side effect from amphotericin -Cr 1.42 today, stable -Continue follow-up with infectious disease and PCP  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 CBC with Differential (Cancer Center Only)    Standing Status:   Future    Standing Expiration Date:   10/05/2020  . CMP ([facility])    Standing Status:   Future    Standing Expiration Date:   10/05/2020  . Lactate dehydrogenase  Standing Status:   Future    Standing Expiration Date:   10/05/2020  . Save Smear (SS[facility])     Standing Status:   Future    Standing Expiration Date:   08/31/2020  . IgG, IgA, IgM    Standing Status:   Future    Standing Expiration Date:   10/05/2020  . Ferritin    Standing Status:   Future    Standing Expiration Date:   10/05/2020  . Iron and TIBC    Standing Status:   Future    Standing Expiration Date:   10/05/2020   The total time spent in the encounter was 40 minutes, including face-to-face time with the patient, review of various tests results, order additional studies/medications, documentation, and coordination of care plan.   All questions were answered. The patient knows to call the clinic with any problems, questions or concerns. No barriers to learning was detected.  Return in 1 month for IVIG, and 2 months for IVIG and clinic appt.   [name], MD 4/22/20219:56 AM  CHIEF COMPLAINT: "I am doing fine"  INTERVAL HISTORY: Mr. Leonetti returns clinic for follow-up of Stage IV CLL on observation.  Patient reports that since last visit, he has been doing well, and denies any constitutional symptoms or enlarging lymph nodes in the neck.  He remains on high-dose fluconazole for recent severe fungal meningitis.  His next appointment with infectious disease is in 10/2019.  He denies any other complaint today.  REVIEW OF SYSTEMS:   Constitutional: ( - ) fevers, ( - )  chills , ( - ) night sweats Eyes: ( - ) blurriness of vision, ( - ) double vision, ( - ) watery eyes Ears, nose, mouth, throat, and face: ( - ) mucositis, ( - ) sore throat Respiratory: ( - ) cough, ( - ) dyspnea, ( - ) wheezes Cardiovascular: ( - ) palpitation, ( - ) chest discomfort, ( - ) lower extremity swelling Gastrointestinal:  ( - ) nausea, ( - ) heartburn, ( - ) change in bowel habits Skin: ( - ) abnormal skin rashes Lymphatics: ( + ) stable lymphadenopathy, ( - ) easy bruising Neurological: ( - ) numbness, ( - ) tingling, ( - ) new weaknesses Behavioral/Psych: ( - ) mood change, ( - ) new changes  All  other systems were reviewed with the patient and are negative.  SUMMARY OF ONCOLOGIC HISTORY: Oncology History  CLL (chronic lymphocytic leukemia) ([place])  04/01/2018 Initial Diagnosis   CLL (chronic lymphocytic leukemia) ([place])   04/05/2018 Pathology Results   Peripheral Blood Flow Cytometry - MONOCLONAL B-CELL POPULATION IDENTIFIED. - SEE NOTE.   04/05/2018 Pathology Results   PB FISH: No evidence of CCND1-IGH [t(11;14)] gene rearrangement. No evidence of trisomy 11 or gain of 11q. (excludes mantle cell lymphoma) No evidence of trisomy 12.  Mono-allelic deletion of [id number] (13q14.3) locus is detected. No evidence of p53 (17p13( deletion or amplification. No evidence of ATM (11q22.3) deletion.    04/07/2018 Imaging   CT neck w/ contrast: IMPRESSION: Bulky bilateral cervical lymphadenopathy consistent with lymphoproliferative disease/lymphoma.   04/07/2018 Imaging   CT CAP w/ contrast: IMPRESSION: 1. Bulky lymphadenopathy throughout the chest, abdomen and pelvis, as detailed above, highly suspicious for lymphoma. 2. Marked splenomegaly. 3. 9 mm pulmonary nodule within the anterior inferior portion of the RIGHT upper lobe. Subtle low-density nodule within the LEFT lower lobe. These are too small to definitively characterize. Neoplastic pulmonary nodules cannot be excluded. Consider PET-CT for further  characterization.   04/16/2018 Surgery   Right axillary LN excisional biopsy   04/16/2018 Pathology Results   (Accession: [id number]) Lymph node for lymphoma, Right Axillary - CHRONIC LYMPHOCYTIC LEUKEMIA/SMALL LYMPHOCYTIC LYMPHOMA.     I have reviewed the past medical history, past surgical history, social history and family history with the patient and they are unchanged from previous note.  ALLERGIES:  is allergic to bactrim [sulfamethoxazole-trimethoprim]; latex; and amoxicillin.  MEDICATIONS:  Current Outpatient Medications  Medication Sig Dispense Refill  .  acetaminophen (TYLENOL) 500 MG tablet Take 500 mg by mouth every 6 (six) hours as needed for mild pain.    . cetirizine (ZYRTEC) 10 MG tablet Take 10 mg by mouth daily.    . fluconazole (DIFLUCAN) 200 MG tablet Take 1 tablet (200 mg total) by mouth daily. 30 tablet 10  . Prenatal Vit-Fe Fumarate-FA (PRENATAL MULTIVITAMIN) TABS tablet Take 1 tablet by mouth daily at 12 noon.     No current facility-administered medications for this visit.   Facility-Administered Medications Ordered in Other Visits  Medication Dose Route Frequency Provider Last Rate Last Admin  . dextrose 5 % solution   Intravenous Continuous [name], MD      . Immune Globulin 10% (PRIVIGEN) IV infusion 40 g  40 g Intravenous Q30 days [name], MD        PHYSICAL EXAMINATION: ECOG PERFORMANCE STATUS: 0 - Asymptomatic  Today's Vitals   09/01/19 0919  BP: 128/90  Pulse: 85  Resp: 18  Temp: 97.7 F (36.5 C)  TempSrc: Temporal  SpO2: 100%  Weight: 237 lb (107.5 kg)  Height: '5\' 10"'  (1.778 m)  PainSc: 0-No pain   Body mass index is 34.01 kg/m.  Filed Weights   09/01/19 0919  Weight: 237 lb (107.5 kg)    GENERAL: alert, no distress and comfortable SKIN: skin color, texture, turgor are normal, no rashes or significant lesions EYES: conjunctiva are pink and non-injected, sclera clear OROPHARYNX: no exudate, no erythema; lips, buccal mucosa, and tongue normal  NECK: supple, non-tender LYMPH:  Shotty bilateral cervical adenopathy, R > L, measuring ~1-1.5cm LUNGS: clear to auscultation with normal breathing effort HEART: regular rate & rhythm and no murmurs and no lower extremity edema ABDOMEN: soft, non-tender, non-distended, normal bowel sounds Musculoskeletal: no cyanosis of digits and no clubbing  PSYCH: alert & oriented x 3, fluent speech  LABORATORY DATA:  I have reviewed the data as listed    Component Value Date/Time   NA 140 09/01/2019 0844   NA 140 03/08/2019 1438   K 4.2 09/01/2019 0844   CL 109  09/01/2019 0844   CO2 24 09/01/2019 0844   GLUCOSE 115 (H) 09/01/2019 0844   BUN 21 (H) 09/01/2019 0844   BUN 25 (H) 03/08/2019 1438   CREATININE 1.42 (H) 09/01/2019 0844   CALCIUM 9.6 09/01/2019 0844   PROT 6.4 (L) 09/01/2019 0844   PROT 5.7 (L) 03/08/2019 1438   ALBUMIN 4.7 09/01/2019 0844   ALBUMIN 4.2 03/08/2019 1438   ALBUMIN 4.8 02/04/2019 0930   AST 17 09/01/2019 0844   ALT 21 09/01/2019 0844   ALKPHOS 52 09/01/2019 0844   BILITOT 0.4 09/01/2019 0844   GFRNONAA 59 (L) 09/01/2019 0844   GFRAA >60 09/01/2019 0844    No results found for: SPEP, UPEP  Lab Results  Component Value Date   WBC 15.9 (H) 09/01/2019   NEUTROABS 2.6 09/01/2019   HGB 14.8 09/01/2019   HCT 45.8 09/01/2019   MCV 78.8 (L) 09/01/2019  PLT 121 (L) 09/01/2019      Chemistry      Component Value Date/Time   NA 140 09/01/2019 0844   NA 140 03/08/2019 1438   K 4.2 09/01/2019 0844   CL 109 09/01/2019 0844   CO2 24 09/01/2019 0844   BUN 21 (H) 09/01/2019 0844   BUN 25 (H) 03/08/2019 1438   CREATININE 1.42 (H) 09/01/2019 0844      Component Value Date/Time   CALCIUM 9.6 09/01/2019 0844   ALKPHOS 52 09/01/2019 0844   AST 17 09/01/2019 0844   ALT 21 09/01/2019 0844   BILITOT 0.4 09/01/2019 0844       RADIOGRAPHIC STUDIES: I have personally reviewed the radiological images as listed below and agreed with the findings in the report. No results found.

## 2019-09-01 NOTE — Patient Instructions (Signed)
Immune Globulin Injection What is this medicine? IMMUNE GLOBULIN (im MUNE GLOB yoo lin) helps to prevent or reduce the severity of certain infections in patients who are at risk. This medicine is collected from the pooled blood of many donors. It is used to treat immune system problems, thrombocytopenia, and Kawasaki syndrome. This medicine may be used for other purposes; ask your health care provider or pharmacist if you have questions. COMMON BRAND NAME(S): ASCENIV, Baygam, BIVIGAM, Carimune, Carimune NF, cutaquig, Cuvitru, Flebogamma, Flebogamma DIF, GamaSTAN, GamaSTAN S/D, Gamimune N, Gammagard, Gammagard S/D, Gammaked, Gammaplex, Gammar-P IV, Gamunex, Gamunex-C, Hizentra, Iveegam, Iveegam EN, Octagam, Panglobulin, Panglobulin NF, panzyga, Polygam S/D, Privigen, Sandoglobulin, Venoglobulin-S, Vigam, Vivaglobulin, Xembify What should I tell my health care provider before I take this medicine? They need to know if you have any of these conditions:  diabetes  extremely low or no immune antibodies in the blood  heart disease  history of blood clots  hyperprolinemia  infection in the blood, sepsis  kidney disease  recently received or scheduled to receive a vaccination  an unusual or allergic reaction to human immune globulin, albumin, maltose, sucrose, other medicines, foods, dyes, or preservatives  pregnant or trying to get pregnant  breast-feeding How should I use this medicine? This medicine is for injection into a muscle or infusion into a vein or skin. It is usually given by a health care professional in a hospital or clinic setting. In rare cases, some brands of this medicine might be given at home. You will be taught how to give this medicine. Use exactly as directed. Take your medicine at regular intervals. Do not take your medicine more often than directed. Talk to your pediatrician regarding the use of this medicine in children. While this drug may be prescribed for selected  conditions, precautions do apply. Overdosage: If you think you have taken too much of this medicine contact a poison control center or emergency room at once. NOTE: This medicine is only for you. Do not share this medicine with others. What if I miss a dose? It is important not to miss your dose. Call your doctor or health care professional if you are unable to keep an appointment. If you give yourself the medicine and you miss a dose, take it as soon as you can. If it is almost time for your next dose, take only that dose. Do not take double or extra doses. What may interact with this medicine?  aspirin and aspirin-like medicines  cisplatin  cyclosporine  medicines for infection like acyclovir, adefovir, amphotericin B, bacitracin, cidofovir, foscarnet, ganciclovir, gentamicin, pentamidine, vancomycin  NSAIDS, medicines for pain and inflammation, like ibuprofen or naproxen  pamidronate  vaccines  zoledronic acid This list may not describe all possible interactions. Give your health care provider a list of all the medicines, herbs, non-prescription drugs, or dietary supplements you use. Also tell them if you smoke, drink alcohol, or use illegal drugs. Some items may interact with your medicine. What should I watch for while using this medicine? Your condition will be monitored carefully while you are receiving this medicine. This medicine is made from pooled blood donations of many different people. It may be possible to pass an infection in this medicine. However, the donors are screened for infections and all products are tested for HIV and hepatitis. The medicine is treated to kill most or all bacteria and viruses. Talk to your doctor about the risks and benefits of this medicine. Do not have vaccinations for at least   14 days before, or until at least 3 months after receiving this medicine. What side effects may I notice from receiving this medicine? Side effects that you should report  to your doctor or health care professional as soon as possible:  allergic reactions like skin rash, itching or hives, swelling of the face, lips, or tongue  blue colored lips or skin  breathing problems  chest pain or tightness  fever  signs and symptoms of aseptic meningitis such as stiff neck; sensitivity to light; headache; drowsiness; fever; nausea; vomiting; rash  signs and symptoms of a blood clot such as chest pain; shortness of breath; pain, swelling, or warmth in the leg  signs and symptoms of hemolytic anemia such as fast heartbeat; tiredness; dark yellow or brown urine; or yellowing of the eyes or skin  signs and symptoms of kidney injury like trouble passing urine or change in the amount of urine  sudden weight gain  swelling of the ankles, feet, hands Side effects that usually do not require medical attention (report to your doctor or health care professional if they continue or are bothersome):  diarrhea  flushing  headache  increased sweating  joint pain  muscle cramps  muscle pain  nausea  pain, redness, or irritation at site where injected  tiredness This list may not describe all possible side effects. Call your doctor for medical advice about side effects. You may report side effects to FDA at 1-800-FDA-1088. Where should I keep my medicine? Keep out of the reach of children. This drug is usually given in a hospital or clinic and will not be stored at home. In rare cases, some brands of this medicine may be given at home. If you are using this medicine at home, you will be instructed on how to store this medicine. Throw away any unused medicine after the expiration date on the label. NOTE: This sheet is a summary. It may not cover all possible information. If you have questions about this medicine, talk to your doctor, pharmacist, or health care provider.  2020 Elsevier/Gold Standard (2018-12-01 12:51:14)  

## 2019-09-01 NOTE — Progress Notes (Signed)
Patient here for f/u apt and IVIG. His insurance has only approved IVIG for three treatments and then he needs home infusion.   Referral sent to [facility] home infusion to begin home IVIG infusion starting in June.

## 2019-09-02 LAB — IGG, IGA, IGM
IgA: 7 mg/dL — ABNORMAL LOW (ref 90–386)
IgG (Immunoglobin G), Serum: 535 mg/dL — ABNORMAL LOW (ref 603–1613)
IgM (Immunoglobulin M), Srm: <5 mg/dL — ABNORMAL LOW (ref 20–172)

## 2019-09-05 ENCOUNTER — Encounter: Payer: Self-pay | Admitting: *Deleted

## 2019-09-05 NOTE — Progress Notes (Signed)
Received a call from [facility] requiring further clarification including our protocol for IVIG administration. All questions answered.

## 2019-09-08 ENCOUNTER — Telehealth: Payer: Self-pay | Admitting: *Deleted

## 2019-09-08 NOTE — Telephone Encounter (Signed)
Received voice mail message from David Whitehead at [facility] regarding patient's Privigen. She stated," David Whitehead's Privigen has been approved and is $6,000 thousand dollars. His copay assistance should cover it. If you have any questions, my return number is [phone].

## 2019-09-30 ENCOUNTER — Other Ambulatory Visit: Payer: Self-pay

## 2019-09-30 ENCOUNTER — Other Ambulatory Visit: Payer: Self-pay | Admitting: Family

## 2019-09-30 ENCOUNTER — Inpatient Hospital Stay: Payer: BC Managed Care – PPO | Attending: Hematology

## 2019-09-30 VITALS — BP 134/82 | HR 66 | Temp 98.0°F | Resp 18 | Wt 241.5 lb

## 2019-09-30 DIAGNOSIS — C9110 Chronic lymphocytic leukemia of B-cell type not having achieved remission: Secondary | ICD-10-CM

## 2019-09-30 DIAGNOSIS — D801 Nonfamilial hypogammaglobulinemia: Secondary | ICD-10-CM | POA: Diagnosis not present

## 2019-09-30 MED ORDER — ACETAMINOPHEN 325 MG PO TABS
650.0000 mg | ORAL_TABLET | Freq: Once | ORAL | Status: AC
  Administered 2019-09-30: 650 mg via ORAL

## 2019-09-30 MED ORDER — DIPHENHYDRAMINE HCL 25 MG PO CAPS
ORAL_CAPSULE | ORAL | Status: AC
  Filled 2019-09-30: qty 1

## 2019-09-30 MED ORDER — IMMUNE GLOBULIN (HUMAN) 20 GM/200ML IV SOLN
40.0000 g | INTRAVENOUS | Status: DC
  Administered 2019-09-30: 40 g via INTRAVENOUS
  Filled 2019-09-30: qty 400

## 2019-09-30 MED ORDER — ACETAMINOPHEN 325 MG PO TABS
ORAL_TABLET | ORAL | Status: AC
  Filled 2019-09-30: qty 2

## 2019-09-30 MED ORDER — DIPHENHYDRAMINE HCL 25 MG PO CAPS
25.0000 mg | ORAL_CAPSULE | Freq: Once | ORAL | Status: AC
  Administered 2019-09-30: 25 mg via ORAL

## 2019-10-03 ENCOUNTER — Encounter: Payer: Self-pay | Admitting: *Deleted

## 2019-10-03 NOTE — Progress Notes (Signed)
Called patient to verify scheduling and financial feasibility for home infusion IVIG.  David Whitehead states he has been approved for home infusion therapy. He also qualified for co-pay assistance of $8000 making his infusions with no out of pocket cost. His first home infusion is scheduled for 11/01/2019.   Patient has no needs or questions at this time. He knows to reach out with any issues.

## 2019-10-25 ENCOUNTER — Telehealth: Payer: Self-pay

## 2019-10-25 DIAGNOSIS — C9110 Chronic lymphocytic leukemia of B-cell type not having achieved remission: Secondary | ICD-10-CM | POA: Diagnosis not present

## 2019-10-25 NOTE — Telephone Encounter (Signed)
CV

## 2019-10-26 ENCOUNTER — Ambulatory Visit (INDEPENDENT_AMBULATORY_CARE_PROVIDER_SITE_OTHER): Payer: BC Managed Care – PPO | Admitting: Infectious Disease

## 2019-10-26 ENCOUNTER — Encounter: Payer: Self-pay | Admitting: Infectious Disease

## 2019-10-26 ENCOUNTER — Other Ambulatory Visit: Payer: Self-pay

## 2019-10-26 VITALS — BP 142/96 | HR 80 | Temp 97.8°F | Ht 70.0 in | Wt 239.0 lb

## 2019-10-26 DIAGNOSIS — B451 Cerebral cryptococcosis: Secondary | ICD-10-CM

## 2019-10-26 DIAGNOSIS — C9110 Chronic lymphocytic leukemia of B-cell type not having achieved remission: Secondary | ICD-10-CM | POA: Diagnosis not present

## 2019-10-26 DIAGNOSIS — B081 Molluscum contagiosum: Secondary | ICD-10-CM | POA: Diagnosis not present

## 2019-10-26 DIAGNOSIS — N179 Acute kidney failure, unspecified: Secondary | ICD-10-CM

## 2019-10-26 HISTORY — DX: Molluscum contagiosum: B08.1

## 2019-10-26 NOTE — Progress Notes (Signed)
Subjective:   Complaint: He has some shiny papular lesions on his dorsum of his hands bilaterally that have responded to steroids but returned   Patient ID: David Whitehead, male    DOB: [birth year], 49 y.o.   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  He underwent VP shunt placement by Dr. Herold Harms while in the hospital for control of his high intracranial pressures.  He completed nearly a month of amphotericin with flucytosine.  Towards the end of his month of therapy he did develop a drug rash and acute kidney injury both attributed to the amphotericin.  He was given prednisone for 3 days and still had the rash at discharge.  He had follow-up labs as an outpatient with his primary care physician which showed his creatinine was still around the same range as it was when he was discharged from the hospital.  He thought the rash was getting worse but actually since he was seen by the PCP it is actually been improving.  Per Dr. Baxter Whitehead the rash was largely on his flank and torso but extended to his extremities when it was at its worse.  The patient confirmed this and showed me that the rash had disappeared from his lower extremities and his back.  It also dissappeared from his torso.  T  I last saw him in clinic in early November and he was having result resolution of this rash.  Now has new lesions on bilateral dorsum of his hands that look very much like molluscum contagiosum.  Prescribed prednisone which temporarily made them look better but then now have recurred.    Headaches or other systemic symptoms to suggest recurrence of infection Past Medical History:  Diagnosis Date  . Cancer ([place])   . Chronic headache   . CLL (chronic lymphocytic leukemia) ([place])   . Diplopia   . Edema 03/14/2019  . Hypertension   . Leukocytosis 04/01/2018  . Lower extremity edema 03/14/2019  .  Sleep apnea    uses a cpap    Past Surgical History:  Procedure Laterality Date  . AXILLARY LYMPH NODE DISSECTION Right 04/16/2018   Procedure: AXILLARY LYMPH NODE DEEP EXCISION;  Surgeon: Fanny Skates, MD;  Location: [facility];  Service: General;  Laterality: Right;  . COLONOSCOPY N/A 04/01/2019   Procedure: COLONOSCOPY;  Surgeon: Danie Binder, MD;  [facility];  Service: Endoscopy;  Laterality: N/A;  12:45pm  . ESOPHAGOGASTRODUODENOSCOPY N/A 04/01/2019   Procedure: ESOPHAGOGASTRODUODENOSCOPY (EGD);  Surgeon: Danie Binder, MD;  [facility];  Service: Endoscopy;  Laterality: N/A;  . FINGER SURGERY    . VENTRICULOPERITONEAL SHUNT Right 02/18/2019   Procedure: SHUNT INSERTION VENTRICULAR-PERITONEAL;  Surgeon: Ashok Pall, MD;  Location: [facility];  Service: Neurosurgery;  Laterality: Right;  SHUNT INSERTION VENTRICULAR-PERITONEAL    Family History  Problem Relation Age of Onset  . Colon cancer Father 46       passed from [place]  . Colon cancer Paternal Uncle 45       passed from [facility]      Social History   Socioeconomic History  . Marital status: Married    Spouse name: David Whitehead  . Number of children: 0  . Years of education: 74  . Highest education level: Not on file  Occupational History  . Not on file  Tobacco Use  . Smoking status: Never Smoker  .  Smokeless tobacco: Never Used  Vaping Use  . Vaping Use: Never used  Substance and Sexual Activity  . Alcohol use: Not Currently  . Drug use: Never  . Sexual activity: Yes  Other Topics Concern  . Not on file  Social History Narrative   Right handed    Lives with wife, David Whitehead   Caffeine use: rare   Social Determinants of Health   Financial Resource Strain: Low Risk   . Difficulty of Paying Living Expenses: Not very hard  Food Insecurity: No Food Insecurity  . Worried About Charity fundraiser in the Last Year: Never true  . Ran Out of Food in the Last Year: Never true  Transportation Needs: No  Transportation Needs  . Lack of Transportation (Medical): No  . Lack of Transportation (Non-Medical): No  Physical Activity:   . Days of Exercise per Week:   . Minutes of Exercise per Session:   Stress:   . Feeling of Stress :   Social Connections:   . Frequency of Communication with Friends and Family:   . Frequency of Social Gatherings with Friends and Family:   . Attends Religious Services:   . Active Member of Clubs or Organizations:   . Attends Archivist Meetings:   Marland Kitchen Marital Status:     Allergies  Allergen Reactions  . Bactrim [Sulfamethoxazole-Trimethoprim] Rash  . Latex Other (See Comments)    Took skin off when removed tape  . Amoxicillin Rash    Has patient had a PCN reaction causing immediate rash, facial/tongue/throat swelling, SOB or lightheadedness with hypotension: No Has patient had a PCN reaction causing severe rash involving mucus membranes or skin necrosis: No Has patient had a PCN reaction that required hospitalization: No Has patient had a PCN reaction occurring within the last 10 years: No If all of the above answers are "NO", then may proceed with Cephalosporin use.      Current Outpatient Medications:  .  acetaminophen (TYLENOL) 500 MG tablet, Take 500 mg by mouth every 6 (six) hours as needed for mild pain., Disp: , Rfl:  .  cetirizine (ZYRTEC) 10 MG tablet, Take 10 mg by mouth daily., Disp: , Rfl:  .  fluconazole (DIFLUCAN) 200 MG tablet, Take 1 tablet (200 mg total) by mouth daily., Disp: 30 tablet, Rfl: 10 .  Prenatal Vit-Fe Fumarate-FA (PRENATAL MULTIVITAMIN) TABS tablet, Take 1 tablet by mouth daily at 12 noon., Disp: , Rfl:    Review of Systems  Constitutional: Negative for activity change, appetite change, chills, diaphoresis, fatigue, fever and unexpected weight change.  HENT: Negative for congestion, rhinorrhea, sinus pressure, sneezing, sore throat and trouble swallowing.   Eyes: Negative for photophobia and visual disturbance.    Respiratory: Negative for cough, chest tightness, shortness of breath, wheezing and stridor.   Cardiovascular: Negative for chest pain, palpitations and leg swelling.  Gastrointestinal: Negative for abdominal distention, abdominal pain, anal bleeding, blood in stool, constipation, diarrhea, nausea and vomiting.  Genitourinary: Negative for difficulty urinating, dysuria, flank pain and hematuria.  Musculoskeletal: Negative for arthralgias, back pain, gait problem, joint swelling and myalgias.  Skin: Positive for rash. Negative for color change, pallor and wound.  Neurological: Negative for dizziness, tremors, weakness and light-headedness.  Hematological: Negative for adenopathy. Does not bruise/bleed easily.  Psychiatric/Behavioral: Negative for agitation, behavioral problems, confusion, decreased concentration, dysphoric mood and sleep disturbance.       Objective:   Physical Exam Constitutional:      General: He is not in  acute distress.    Appearance: Normal appearance. He is well-developed. He is not ill-appearing or diaphoretic.  HENT:     Head: Normocephalic and atraumatic.     Right Ear: Hearing and external ear normal.     Left Ear: Hearing and external ear normal.     Nose: No nasal deformity or rhinorrhea.  Eyes:     General: No scleral icterus.    Extraocular Movements: Extraocular movements intact.     Conjunctiva/sclera: Conjunctivae normal.     Right eye: Right conjunctiva is not injected.     Left eye: Left conjunctiva is not injected.  Neck:     Vascular: No JVD.  Cardiovascular:     Rate and Rhythm: Normal rate and regular rhythm.     Heart sounds: Normal heart sounds, S1 normal and S2 normal. No murmur heard.  No friction rub.  Abdominal:     General: Bowel sounds are normal. There is no distension.     Palpations: Abdomen is soft.     Tenderness: There is no abdominal tenderness.  Musculoskeletal:        General: Normal range of motion.     Right shoulder:  Normal.     Left shoulder: Normal.     Cervical back: Normal range of motion and neck supple.     Right hip: Normal.     Left hip: Normal.     Right knee: Normal.     Left knee: Normal.  Lymphadenopathy:     Head:     Right side of head: No submandibular, preauricular or posterior auricular adenopathy.     Left side of head: No submandibular, preauricular or posterior auricular adenopathy.     Cervical: No cervical adenopathy.     Right cervical: No superficial or deep cervical adenopathy.    Left cervical: No superficial or deep cervical adenopathy.  Skin:    General: Skin is warm and dry.     Coloration: Skin is not pale.     Findings: No abrasion, bruising, ecchymosis, erythema, lesion or rash.     Nails: There is no clubbing.  Neurological:     Mental Status: He is alert and oriented to person, place, and time.     Sensory: No sensory deficit.     Coordination: Coordination normal.     Gait: Gait normal.  Psychiatric:        Attention and Perception: He is attentive.        Mood and Affect: Mood normal.        Speech: Speech normal.        Behavior: Behavior normal. Behavior is cooperative.        Thought Content: Thought content normal.        Judgment: Judgment normal.     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  I have asked him to observe it off steroids and see a dermatologist if not improving.  COVID-19 prevention he has had vaccines for this

## 2019-10-27 LAB — CBC WITH DIFFERENTIAL/PLATELET
Absolute Monocytes: 6027 cells/uL — ABNORMAL HIGH (ref 200–950)
Basophils Absolute: 146 cells/uL (ref 0–200)
Basophils Relative: 0.6 %
Eosinophils Absolute: 439 cells/uL (ref 15–500)
Eosinophils Relative: 1.8 %
HCT: 43.7 % (ref 38.5–50.0)
Hemoglobin: 14.4 g/dL (ref 13.2–17.1)
Lymphs Abs: 14908 cells/uL — ABNORMAL HIGH (ref 850–3900)
MCH: 26.3 pg — ABNORMAL LOW (ref 27.0–33.0)
MCHC: 33 g/dL (ref 32.0–36.0)
MCV: 79.9 fL — ABNORMAL LOW (ref 80.0–100.0)
MPV: 10.9 fL (ref 7.5–12.5)
Monocytes Relative: 24.7 %
Neutro Abs: 2879 cells/uL (ref 1500–7800)
Neutrophils Relative %: 11.8 %
Platelets: 102 10*3/uL — ABNORMAL LOW (ref 140–400)
RBC: 5.47 10*6/uL (ref 4.20–5.80)
RDW: 13.3 % (ref 11.0–15.0)
Total Lymphocyte: 61.1 %
WBC: 24.4 10*3/uL — ABNORMAL HIGH (ref 3.8–10.8)

## 2019-10-27 LAB — COMPLETE METABOLIC PANEL WITH GFR
AG Ratio: 2.1 (calc) (ref 1.0–2.5)
ALT: 18 U/L (ref 9–46)
AST: 18 U/L (ref 10–40)
Albumin: 4.5 g/dL (ref 3.6–5.1)
Alkaline phosphatase (APISO): 58 U/L (ref 36–130)
BUN: 17 mg/dL (ref 7–25)
CO2: 26 mmol/L (ref 20–32)
Calcium: 9.6 mg/dL (ref 8.6–10.3)
Chloride: 107 mmol/L (ref 98–110)
Creat: 1.15 mg/dL (ref 0.60–1.35)
GFR, Est African American: 88 mL/min/{1.73_m2} (ref >=60)
GFR, Est Non African American: 76 mL/min/{1.73_m2} (ref >=60)
Globulin: 2.1 g/dL (calc) (ref 1.9–3.7)
Glucose, Bld: 103 mg/dL — ABNORMAL HIGH (ref 65–99)
Potassium: 4.3 mmol/L (ref 3.5–5.3)
Sodium: 141 mmol/L (ref 135–146)
Total Bilirubin: 0.3 mg/dL (ref 0.2–1.2)
Total Protein: 6.6 g/dL (ref 6.1–8.1)

## 2019-11-02 ENCOUNTER — Encounter: Payer: Self-pay | Admitting: *Deleted

## 2019-11-02 ENCOUNTER — Inpatient Hospital Stay (HOSPITAL_BASED_OUTPATIENT_CLINIC_OR_DEPARTMENT_OTHER): Payer: BC Managed Care – PPO | Admitting: Hematology & Oncology

## 2019-11-02 ENCOUNTER — Encounter: Payer: Self-pay | Admitting: Hematology & Oncology

## 2019-11-02 ENCOUNTER — Other Ambulatory Visit: Payer: Self-pay

## 2019-11-02 ENCOUNTER — Inpatient Hospital Stay: Payer: BC Managed Care – PPO | Attending: Hematology

## 2019-11-02 ENCOUNTER — Ambulatory Visit: Payer: BC Managed Care – PPO

## 2019-11-02 VITALS — BP 148/98 | HR 72 | Temp 98.3°F | Resp 20 | Wt 242.1 lb

## 2019-11-02 DIAGNOSIS — D801 Nonfamilial hypogammaglobulinemia: Secondary | ICD-10-CM | POA: Diagnosis not present

## 2019-11-02 DIAGNOSIS — Z79899 Other long term (current) drug therapy: Secondary | ICD-10-CM | POA: Diagnosis not present

## 2019-11-02 DIAGNOSIS — C9110 Chronic lymphocytic leukemia of B-cell type not having achieved remission: Secondary | ICD-10-CM | POA: Insufficient documentation

## 2019-11-02 DIAGNOSIS — R238 Other skin changes: Secondary | ICD-10-CM | POA: Diagnosis not present

## 2019-11-02 LAB — CBC WITH DIFFERENTIAL (CANCER CENTER ONLY)
Abs Immature Granulocytes: 0.2 10*3/uL — ABNORMAL HIGH (ref 0.00–0.07)
Basophils Absolute: 0 10*3/uL (ref 0.0–0.1)
Basophils Relative: 0 %
Eosinophils Absolute: 0.2 10*3/uL (ref 0.0–0.5)
Eosinophils Relative: 1 %
HCT: 42.2 % (ref 39.0–52.0)
Hemoglobin: 13.7 g/dL (ref 13.0–17.0)
Lymphocytes Relative: 84 %
Lymphs Abs: 20.7 10*3/uL — ABNORMAL HIGH (ref 0.7–4.0)
MCH: 26.1 pg (ref 26.0–34.0)
MCHC: 32.5 g/dL (ref 30.0–36.0)
MCV: 80.5 fL (ref 80.0–100.0)
Metamyelocytes Relative: 1 %
Monocytes Absolute: 0.7 10*3/uL (ref 0.1–1.0)
Monocytes Relative: 3 %
Neutro Abs: 2.7 10*3/uL (ref 1.7–7.7)
Neutrophils Relative %: 11 %
Platelet Count: 82 10*3/uL — ABNORMAL LOW (ref 150–400)
RBC: 5.24 MIL/uL (ref 4.22–5.81)
RDW: 13.2 % (ref 11.5–15.5)
WBC Count: 24.7 10*3/uL — ABNORMAL HIGH (ref 4.0–10.5)
nRBC: 0 % (ref 0.0–0.2)

## 2019-11-02 LAB — CMP (CANCER CENTER ONLY)
ALT: 18 U/L (ref 0–44)
AST: 20 U/L (ref 15–41)
Albumin: 4.3 g/dL (ref 3.5–5.0)
Alkaline Phosphatase: 56 U/L (ref 38–126)
Anion gap: 6 (ref 5–15)
BUN: 17 mg/dL (ref 6–20)
CO2: 26 mmol/L (ref 22–32)
Calcium: 9.3 mg/dL (ref 8.9–10.3)
Chloride: 106 mmol/L (ref 98–111)
Creatinine: 1.16 mg/dL (ref 0.61–1.24)
GFR, Est AFR Am: >60 mL/min (ref >60)
GFR, Estimated: >60 mL/min (ref >60)
Glucose, Bld: 116 mg/dL — ABNORMAL HIGH (ref 70–99)
Potassium: 4.1 mmol/L (ref 3.5–5.1)
Sodium: 138 mmol/L (ref 135–145)
Total Bilirubin: 0.3 mg/dL (ref 0.3–1.2)
Total Protein: 7.1 g/dL (ref 6.5–8.1)

## 2019-11-02 LAB — IRON AND TIBC
Iron: 48 ug/dL (ref 42–163)
Saturation Ratios: 15 % — ABNORMAL LOW (ref 20–55)
TIBC: 329 ug/dL (ref 202–409)
UIBC: 280 ug/dL (ref 117–376)

## 2019-11-02 LAB — FERRITIN: Ferritin: 42 ng/mL (ref 24–336)

## 2019-11-02 LAB — SAVE SMEAR(SSMR), FOR PROVIDER SLIDE REVIEW

## 2019-11-02 LAB — LACTATE DEHYDROGENASE: LDH: 306 U/L — ABNORMAL HIGH (ref 98–192)

## 2019-11-02 NOTE — Progress Notes (Signed)
Hematology and Oncology Follow Up Visit  David Whitehead [id number] 1971-04-09 49 y.o. 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 daily     Interim History:  David Whitehead is back for follow-up.  This is the first time that I am seeing him.  He was followed quite expertly by Dr. Maylon Peppers.  David Whitehead has clearly high risk CLL.  He is quite young.  He has the 17p-abnormality.  This puts him at significant risk.  He was treated with Imbruvica.  He was responding to [place].  Unfortunately, he did develop cryptococcal meningitis back in I think October of last year.  He was in the hospital for 28 days.  He received IV amphotericin.  He was then placed onto Diflucan.  He takes fluconazole until August of this year.  He really feels okay.  He does not have any headaches.  He has had no obvious swollen lymph nodes.  He has had no nausea or vomiting.  There is no change in bowel or bladder habits.  He has had no fever.  There has been no bleeding.  He has been considered for venetoclax.  Given that he is so young, I am just wondering if he may not be a candidate for some type of aggressive intervention, such as stem cell transplantation or CAR-T therapy.  He is in very good shape.  I noted on the dorsum of his right hand he has about 5 papules.  These are probably 5-6 mm in size.  They are nontender.  I spoke to his dermatologist up in [place], Dr. Allyn Kenner, and Dr. Nevada Crane has graciously agreed to see David Whitehead tomorrow and do a biopsy.  I worry that his papules might be cutaneous manifestation of CLL.  He has had no leg swelling.  Currently, I would say his performance status is probably ECOG 1.  Medications:  Current Outpatient Medications:  .  acetaminophen (TYLENOL) 500 MG tablet, Take 500 mg by mouth every 6 (six) hours as needed for mild pain., Disp: , Rfl:  .   cetirizine (ZYRTEC) 10 MG tablet, Take 10 mg by mouth daily., Disp: , Rfl:  .  fluconazole (DIFLUCAN) 200 MG tablet, Take 1 tablet (200 mg total) by mouth daily., Disp: 30 tablet, Rfl: 10 .  Prenatal Vit-Fe Fumarate-FA (PRENATAL MULTIVITAMIN) TABS tablet, Take 1 tablet by mouth daily at 12 noon., Disp: , Rfl:  .  EPINEPHrine 0.3 mg/0.3 mL IJ SOAJ injection, , Disp: , Rfl:   Allergies:  Allergies  Allergen Reactions  . Bactrim [Sulfamethoxazole-Trimethoprim] Rash  . Latex Other (See Comments)    Took skin off when removed tape  . Amoxicillin Rash    Has patient had a PCN reaction causing immediate rash, facial/tongue/throat swelling, SOB or lightheadedness with hypotension: No Has patient had a PCN reaction causing severe rash involving mucus membranes or skin necrosis: No Has patient had a PCN reaction that required hospitalization: No Has patient had a PCN reaction occurring within the last 10 years: No If all of the above answers are "NO", then may proceed with Cephalosporin use.     Past Medical History, Surgical history, Social history, and Family History were reviewed and updated.  Review of Systems: Review of Systems  Constitutional: Negative.   HENT:  Negative.   Eyes: Negative.   Respiratory: Negative.   Cardiovascular: Negative.   Gastrointestinal: Negative.  Endocrine: Negative.   Genitourinary: Negative.    Musculoskeletal: Negative.   Skin: Negative.   Neurological: Negative.   Hematological: Negative.   Psychiatric/Behavioral: Negative.     Physical Exam:  weight is 242 lb 1.9 oz (109.8 kg). His oral temperature is 98.3 F (36.8 C). His blood pressure is 148/98 (abnormal) and his pulse is 72. His respiration is 20 and oxygen saturation is 98%.   Wt Readings from Last 3 Encounters:  11/02/19 242 lb 1.9 oz (109.8 kg)  10/26/19 239 lb (108.4 kg)  09/30/19 241 lb 8 oz (109.5 kg)    Physical Exam Vitals reviewed.  HENT:     Head: Normocephalic and  atraumatic.  Eyes:     Pupils: Pupils are equal, round, and reactive to light.  Neck:     Comments: On his neck exam, he does have some adenopathy bilaterally in the posterior cervical chain.  There is also some adenopathy in the supraclavicular chain.  These are mobile and nontender.  They probably measure about 5-10 mm in size. Cardiovascular:     Rate and Rhythm: Normal rate and regular rhythm.     Heart sounds: Normal heart sounds.  Pulmonary:     Effort: Pulmonary effort is normal.     Breath sounds: Normal breath sounds.  Abdominal:     General: Bowel sounds are normal.     Palpations: Abdomen is soft.  Musculoskeletal:        General: No tenderness or deformity. Normal range of motion.     Cervical back: Normal range of motion.     Comments: On the dorsum of his right hand, he has 5 papular lesions.  They are nontender.  They are reddish in nature.  They probably measure about 5 mm in size.  Lymphadenopathy:     Cervical: No cervical adenopathy.  Skin:    General: Skin is warm and dry.     Findings: No erythema or rash.  Neurological:     Mental Status: He is alert and oriented to person, place, and time.  Psychiatric:        Behavior: Behavior normal.        Thought Content: Thought content normal.        Judgment: Judgment normal.      Lab Results  Component Value Date   WBC 24.7 (H) 11/02/2019   HGB 13.7 11/02/2019   HCT 42.2 11/02/2019   MCV 80.5 11/02/2019   PLT 82 (L) 11/02/2019     Chemistry      Component Value Date/Time   NA 138 11/02/2019 0843   NA 140 03/08/2019 1438   K 4.1 11/02/2019 0843   CL 106 11/02/2019 0843   CO2 26 11/02/2019 0843   BUN 17 11/02/2019 0843   BUN 25 (H) 03/08/2019 1438   CREATININE 1.16 11/02/2019 0843   CREATININE 1.15 10/26/2019 0920      Component Value Date/Time   CALCIUM 9.3 11/02/2019 0843   ALKPHOS 56 11/02/2019 0843   AST 20 11/02/2019 0843   ALT 18 11/02/2019 0843   BILITOT 0.3 11/02/2019 0843       Impression and Plan: David Whitehead is a very nice 49 year old white male.  He has CLL.  I must say that he certainly is quite young for CLL.  He clearly has an aggressive CLL by virtue of the cytogenetics.  I think we need to get scans on him so we can see how his adenopathy looks.  Clearly, if the papules  on the dorsum of his right hand are CLL, then we will have to get him onto treatment.  We can certainly get him on venetoclax.  However, ultimately, I really think he is probably going to need something little bit more aggressive.  I will have to try to make some phone calls to see what might be out there that would be considered for him.  He is very nice.  He is from [place].  We talked a lot about [place].  We will try to get him back to see [place] in another couple weeks or so.  Again, the biopsy of the hand will be critical.  I spent about 40 minutes with him today.  This is incredibly complicated.  We really need to pursue aggressive interventions for him given his young age and overall excellent performance status.   Volanda Napoleon, MD 6/23/202112:27 PM

## 2019-11-02 NOTE — Progress Notes (Signed)
Patient is seen by Dr Marin Olp, and needs referral to dermatology and scans scheduled.   Dr Marin Olp spoke with dermatology and arranged for appointment tomorrow. Patient scans require PA. Will schedule once authorization is obtained.   Oncology Nurse Navigator Documentation  Oncology Nurse Navigator Flowsheets 11/02/2019  Abnormal Finding Date -  Confirmed Diagnosis Date -  Diagnosis Status -  Phase of Treatment Chemo  Chemotherapy Actual Start Date: 05/14/2018  Expected Surgery Date -  Navigator Follow Up Date: 12/02/2019  Navigator Follow Up Reason: Follow-up After Biopsy  Navigator Location [facility]  Referral Date to RadOnc/MedOnc -  Navigator Encounter Type Follow-up Appt  Telephone -  Treatment Initiated Date 05/14/2018  Patient Visit Type MedOnc  Treatment Phase Active Tx  Barriers/Navigation Needs Coordination of Care;Unemployed  Education -  Interventions -  Acuity Level 2-Minimal Needs (1-2 Barriers Identified)  Referrals -  Coordination of Care -  Education Method -  Support Groups/Services Friends and Family  Time Spent with Patient -

## 2019-11-03 DIAGNOSIS — L308 Other specified dermatitis: Secondary | ICD-10-CM | POA: Diagnosis not present

## 2019-11-03 DIAGNOSIS — L920 Granuloma annulare: Secondary | ICD-10-CM | POA: Diagnosis not present

## 2019-11-04 LAB — IGG, IGA, IGM
IgA: <5 mg/dL — ABNORMAL LOW (ref 90–386)
IgG (Immunoglobin G), Serum: 1482 mg/dL (ref 603–1613)
IgM (Immunoglobulin M), Srm: <5 mg/dL — ABNORMAL LOW (ref 20–172)

## 2019-11-10 ENCOUNTER — Encounter (HOSPITAL_BASED_OUTPATIENT_CLINIC_OR_DEPARTMENT_OTHER): Payer: Self-pay

## 2019-11-10 ENCOUNTER — Other Ambulatory Visit: Payer: Self-pay

## 2019-11-10 ENCOUNTER — Ambulatory Visit (HOSPITAL_BASED_OUTPATIENT_CLINIC_OR_DEPARTMENT_OTHER)
Admission: RE | Admit: 2019-11-10 | Discharge: 2019-11-10 | Disposition: A | Discharge status: Home / Self Care | Payer: BC Managed Care – PPO | Source: Ambulatory Visit | Attending: Hematology & Oncology | Admitting: Hematology & Oncology

## 2019-11-10 DIAGNOSIS — I700 Atherosclerosis of aorta: Secondary | ICD-10-CM | POA: Diagnosis not present

## 2019-11-10 DIAGNOSIS — C9110 Chronic lymphocytic leukemia of B-cell type not having achieved remission: Secondary | ICD-10-CM | POA: Diagnosis not present

## 2019-11-10 DIAGNOSIS — R590 Localized enlarged lymph nodes: Secondary | ICD-10-CM | POA: Diagnosis not present

## 2019-11-10 DIAGNOSIS — K111 Hypertrophy of salivary gland: Secondary | ICD-10-CM | POA: Diagnosis not present

## 2019-11-10 MED ORDER — IOHEXOL 300 MG/ML  SOLN
100.0000 mL | Freq: Once | INTRAMUSCULAR | Status: AC | PRN
  Administered 2019-11-10: 100 mL via INTRAVENOUS

## 2019-11-21 ENCOUNTER — Telehealth: Payer: Self-pay | Admitting: Hematology & Oncology

## 2019-11-21 NOTE — Telephone Encounter (Signed)
I called and spoke with patient about appointments being moved per  7/12 result note.  He was ok with new date/time

## 2019-11-22 ENCOUNTER — Encounter: Payer: Self-pay | Admitting: Nurse Practitioner

## 2019-11-22 ENCOUNTER — Other Ambulatory Visit: Payer: Self-pay

## 2019-11-22 ENCOUNTER — Ambulatory Visit (INDEPENDENT_AMBULATORY_CARE_PROVIDER_SITE_OTHER): Payer: BC Managed Care – PPO | Admitting: Nurse Practitioner

## 2019-11-22 VITALS — BP 139/97 | HR 93 | Temp 97.1°F | Ht 70.0 in | Wt 237.2 lb

## 2019-11-22 DIAGNOSIS — E611 Iron deficiency: Secondary | ICD-10-CM | POA: Diagnosis not present

## 2019-11-22 NOTE — Progress Notes (Signed)
Referring Provider: [name], MD Primary Care Physician:  [name], MD Primary GI:  Dr. Gala Romney (in the absence of Dr. Oneida Alar); pending Dr. Abbey Chatters  Chief Complaint  Patient presents with  . Anemia    HPI:   David Whitehead is a 49 y.o. male who presents for follow-up on anemia.  The patient was last seen in our office 05/24/2019 for IDA and family history of colon cancer.  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  Denies overt GI bleed but iron panel consistent with IDA, family history of colon cancer in her father.  Initial labs with saturation low at 9%, iron low at 37.  Hemoglobin has been trending below 13 to low 14 range, all CBCs hyperchromic and last CBC microcytic with MCV of 79.4.  Denies previous colonoscopy or EGD, currently on chemotherapy pill.  Colonoscopy and EGD were completed 04/01/2019.  A colonoscopy found normal colon, external hemorrhoids, no source for anemia and recommended repeat in 10 years.  The EGD found localized mild inflammation likely due to gastritis in the setting of thrombocytopenia and/or failure hematopoiesis, status post gastric biopsies found to be antral and oxyntic mucosa with mild chronic inflammation.  Recommended no further additional work-up, repeat CBC in 3 months.  Has follow-up CBC improved hemoglobin to 13.6.  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  No other overt GI complaints.  He is on Diflucan from the fungal meningitis planned 8 to 12 months of duration.  Last CMP in December with normal LFTs.  Recommended follow-up with hematology/oncology as well as infectious disease, we will check their lab results in February, follow-up in 6 months.  The patient last saw oncologist 11/02/2019 for CLL.  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  Felt  well overall.  Query candidacy for stem cell transplant or CAR-T therapy due to his young age.  Noted posterior hand papules and was referred to dermatology who agreed to see him the next day for biopsy query cutaneous manifestations of CLL.  Recommended repeat scans, can start on Venetoclax, but ultimately will need more aggressive therapy due to his aggressive CLL.  Plan to see him back in a couple weeks.  Most recent CBC completed 11/02/2019 which found stable hemoglobin at 13.7, persistently elevated white blood cell count at 24.7, platelet count low at 82.  CMP with normal LFTs and creatinine.  Noted splenomegaly likely related to his CLL given interval increased size and number of lymph nodes throughout his chest, abdomen, pelvis.  Today he states he is doing well overall. His skin spots came back as NOT cutaneous CLL. He is happy for this. He has a follow-up appointment upcoming next week to discuss new medications. Denies hematochezia, melena. Denies abdominal pain, N/V, fever, chills, unintentional weight loss. Appetite is good, energy level pretty good. Denies URI or flu-like symptoms. Denies loss of sense of taste or smell. The patient has received COVID-19 vaccination(s). Denies chest pain, dyspnea, dizziness, lightheadedness, syncope, near syncope. Denies any other upper or lower GI symptoms.   Past Medical History:  Diagnosis Date  . Cancer ([place])   . Chronic headache   . CLL (chronic lymphocytic leukemia) ([place])   . Diplopia   . Edema 03/14/2019  . Hypertension   . Leukocytosis 04/01/2018  . Lower extremity edema 03/14/2019  .  Molluscum contagiosum 10/26/2019  . Sleep apnea    uses a cpap    Past Surgical History:  Procedure Laterality Date  . AXILLARY LYMPH NODE DISSECTION Right 04/16/2018   Procedure: AXILLARY LYMPH NODE DEEP EXCISION;  Surgeon: Fanny Skates, MD;  Location: [facility];  Service: General;  Laterality: Right;  . COLONOSCOPY N/A 04/01/2019   Procedure: COLONOSCOPY;   Surgeon: Danie Binder, MD;  [facility];  Service: Endoscopy;  Laterality: N/A;  12:45pm  . ESOPHAGOGASTRODUODENOSCOPY N/A 04/01/2019   Procedure: ESOPHAGOGASTRODUODENOSCOPY (EGD);  Surgeon: Danie Binder, MD;  [facility];  Service: Endoscopy;  Laterality: N/A;  . FINGER SURGERY    . VENTRICULOPERITONEAL SHUNT Right 02/18/2019   Procedure: SHUNT INSERTION VENTRICULAR-PERITONEAL;  Surgeon: Ashok Pall, MD;  Location: [facility];  Service: Neurosurgery;  Laterality: Right;  SHUNT INSERTION VENTRICULAR-PERITONEAL    Current Outpatient Medications  Medication Sig Dispense Refill  . acetaminophen (TYLENOL) 500 MG tablet Take 500 mg by mouth every 6 (six) hours as needed for mild pain.    . cetirizine (ZYRTEC) 10 MG tablet Take 10 mg by mouth daily.    . fluconazole (DIFLUCAN) 200 MG tablet Take 1 tablet (200 mg total) by mouth daily. 30 tablet 10  . Prenatal Vit-Fe Fumarate-FA (PRENATAL MULTIVITAMIN) TABS tablet Take 1 tablet by mouth daily at 12 noon.     No current facility-administered medications for this visit.    Allergies as of 11/22/2019 - Review Complete 11/22/2019  Allergen Reaction Noted  . Bactrim [sulfamethoxazole-trimethoprim] Rash 05/28/2018  . Latex Other (See Comments) 01/20/2019  . Amoxicillin Rash 04/05/2018    Family History  Problem Relation Age of Onset  . Colon cancer Father 73       passed from [place]  . Colon cancer Paternal Uncle 77       passed from [facility]    Social History   Socioeconomic History  . Marital status: Married    Spouse name: [name]  . Number of children: 0  . Years of education: 28  . Highest education level: Not on file  Occupational History  . Not on file  Tobacco Use  . Smoking status: Never Smoker  . Smokeless tobacco: Never Used  Vaping Use  . Vaping Use: Never used  Substance and Sexual Activity  . Alcohol use: Not Currently  . Drug use: Never  . Sexual activity: Yes  Other Topics Concern  . Not on file    Social History Narrative   Right handed    Lives with wife, [name]   Caffeine use: rare   Social Determinants of Health   Financial Resource Strain: Low Risk   . Difficulty of Paying Living Expenses: Not very hard  Food Insecurity: No Food Insecurity  . Worried About Charity fundraiser in the Last Year: Never true  . Ran Out of Food in the Last Year: Never true  Transportation Needs: No Transportation Needs  . Lack of Transportation (Medical): No  . Lack of Transportation (Non-Medical): No  Physical Activity:   . Days of Exercise per Week:   . Minutes of Exercise per Session:   Stress:   . Feeling of Stress :   Social Connections:   . Frequency of Communication with Friends and Family:   . Frequency of Social Gatherings with Friends and Family:   . Attends Religious Services:   . Active Member of Clubs or Organizations:   . Attends Archivist Meetings:   Marland Kitchen Marital  Status:     Subjective: Review of Systems  Constitutional: Negative for chills, fever, malaise/fatigue and weight loss.  HENT: Negative for congestion and sore throat.   Respiratory: Negative for cough and shortness of breath.   Cardiovascular: Negative for chest pain and palpitations.  Gastrointestinal: Negative for abdominal pain, blood in stool, diarrhea, melena, nausea and vomiting.  Musculoskeletal: Negative for joint pain and myalgias.  Skin: Negative for rash.  Neurological: Negative for dizziness and weakness.  Endo/Heme/Allergies: Does not bruise/bleed easily.  Psychiatric/Behavioral: Negative for depression. The patient is not nervous/anxious.   All other systems reviewed and are negative.    Objective: BP (!) 139/97   Pulse 93   Temp (!) 97.1 F (36.2 C) (Oral)   Ht 5\' 10"  (1.778 m)   Wt 237 lb 3.2 oz (107.6 kg)   BMI 34.03 kg/m  Physical Exam Vitals and nursing note reviewed.  Constitutional:      General: He is not in acute distress.    Appearance: Normal appearance. He is  obese. He is not ill-appearing, toxic-appearing or diaphoretic.  HENT:     Head: Normocephalic and atraumatic.     Nose: No congestion or rhinorrhea.  Eyes:     General: No scleral icterus. Cardiovascular:     Rate and Rhythm: Normal rate and regular rhythm.     Heart sounds: Normal heart sounds.  Pulmonary:     Effort: Pulmonary effort is normal.     Breath sounds: Normal breath sounds.  Abdominal:     General: Bowel sounds are normal. There is no distension.     Palpations: Abdomen is soft. There is no hepatomegaly, splenomegaly or mass.     Tenderness: There is no abdominal tenderness. There is no guarding or rebound.     Hernia: No hernia is present.  Musculoskeletal:     Cervical back: Neck supple.  Skin:    General: Skin is warm and dry.     Coloration: Skin is not jaundiced.     Findings: No bruising or rash.  Neurological:     General: No focal deficit present.     Mental Status: He is alert and oriented to person, place, and time. Mental status is at baseline.  Psychiatric:        Mood and Affect: Mood normal.        Behavior: Behavior normal.        Thought Content: Thought content normal.       11/22/2019 3:12 PM   Disclaimer: This note was dictated with voice recognition software. Similar sounding words can inadvertently be transcribed and may not be corrected upon review.

## 2019-11-22 NOTE — Assessment & Plan Note (Signed)
The patient previously had IDA in 2020. Iron, Ferritin improved/normalized January 2021. Today he hgb from a couple weeks ago is improved. No noted bleeding. He continues to see Heme/Onc for aggressive CLL, possibly starting a new treatment next week. May eventually need aggressive therapy/possible stem cell transplant. Recommend he monitor for any recurrent bleeding, notify us of such.  Otherwise continue to follow with oncology.  Follow-up in our office in 1 year.

## 2019-11-22 NOTE — Patient Instructions (Signed)
Your health issues we discussed today were:   Anemia: 1. As we discussed, your anemia labs (hemoglobin) look better 2. Notify us if you see any obvious bleeding 3. Continue to follow with oncology  Overall I recommend:  1. Continue your other current medications 2. Return for follow-up 1 year 3. Call us if you have any questions or concerns   ---------------------------------------------------------------  I am glad you have gotten your COVID-19 vaccination!  Even though you are fully vaccinated you should continue to follow CDC and state/local guidelines.  ---------------------------------------------------------------   At [facility] we value your feedback. You may receive a survey about your visit today. Please share your experience as we strive to create trusting relationships with our patients to provide genuine, compassionate, quality care.  We appreciate your understanding and patience as we review any laboratory studies, imaging, and other diagnostic tests that are ordered as we care for you. Our office policy is 5 business days for review of these results, and any emergent or urgent results are addressed in a timely manner for your best interest. If you do not hear from our office in 1 week, please contact us.   We also encourage the use of MyChart, which contains your medical information for your review as well. If you are not enrolled in this feature, an access code is on this after visit summary for your convenience. Thank you for allowing [place] to be involved in your care.  It was great to see you today!  I hope you have a great Summer!!

## 2019-11-23 NOTE — Progress Notes (Signed)
Cc'ed to pcp °

## 2019-11-24 DIAGNOSIS — C9110 Chronic lymphocytic leukemia of B-cell type not having achieved remission: Secondary | ICD-10-CM | POA: Diagnosis not present

## 2019-11-29 ENCOUNTER — Telehealth: Payer: Self-pay | Admitting: Hematology & Oncology

## 2019-11-29 ENCOUNTER — Inpatient Hospital Stay (HOSPITAL_BASED_OUTPATIENT_CLINIC_OR_DEPARTMENT_OTHER): Payer: BC Managed Care – PPO | Admitting: Hematology & Oncology

## 2019-11-29 ENCOUNTER — Encounter: Payer: Self-pay | Admitting: *Deleted

## 2019-11-29 ENCOUNTER — Inpatient Hospital Stay: Payer: BC Managed Care – PPO | Attending: Hematology

## 2019-11-29 ENCOUNTER — Other Ambulatory Visit (HOSPITAL_COMMUNITY): Payer: Self-pay | Admitting: *Deleted

## 2019-11-29 ENCOUNTER — Other Ambulatory Visit: Payer: Self-pay

## 2019-11-29 ENCOUNTER — Encounter: Payer: Self-pay | Admitting: Hematology & Oncology

## 2019-11-29 VITALS — BP 122/86 | HR 89 | Temp 98.2°F | Resp 18 | Wt 238.0 lb

## 2019-11-29 DIAGNOSIS — C9110 Chronic lymphocytic leukemia of B-cell type not having achieved remission: Secondary | ICD-10-CM | POA: Diagnosis not present

## 2019-11-29 LAB — CMP (CANCER CENTER ONLY)
ALT: 23 U/L (ref 0–44)
AST: 21 U/L (ref 15–41)
Albumin: 4.7 g/dL (ref 3.5–5.0)
Alkaline Phosphatase: 61 U/L (ref 38–126)
Anion gap: 7 (ref 5–15)
BUN: 14 mg/dL (ref 6–20)
CO2: 27 mmol/L (ref 22–32)
Calcium: 9.4 mg/dL (ref 8.9–10.3)
Chloride: 106 mmol/L (ref 98–111)
Creatinine: 1.22 mg/dL (ref 0.61–1.24)
GFR, Est AFR Am: >60 mL/min (ref >60)
GFR, Estimated: >60 mL/min (ref >60)
Glucose, Bld: 94 mg/dL (ref 70–99)
Potassium: 4 mmol/L (ref 3.5–5.1)
Sodium: 140 mmol/L (ref 135–145)
Total Bilirubin: 0.4 mg/dL (ref 0.3–1.2)
Total Protein: 6.6 g/dL (ref 6.5–8.1)

## 2019-11-29 LAB — CBC WITH DIFFERENTIAL (CANCER CENTER ONLY)
Abs Immature Granulocytes: 0.1 10*3/uL — ABNORMAL HIGH (ref 0.00–0.07)
Basophils Absolute: 0.2 10*3/uL — ABNORMAL HIGH (ref 0.0–0.1)
Basophils Relative: 1 %
Eosinophils Absolute: 0.5 10*3/uL (ref 0.0–0.5)
Eosinophils Relative: 2 %
HCT: 45.1 % (ref 39.0–52.0)
Hemoglobin: 14.6 g/dL (ref 13.0–17.0)
Immature Granulocytes: 0 %
Lymphocytes Relative: 74 %
Lymphs Abs: 22.7 10*3/uL — ABNORMAL HIGH (ref 0.7–4.0)
MCH: 26 pg (ref 26.0–34.0)
MCHC: 32.4 g/dL (ref 30.0–36.0)
MCV: 80.4 fL (ref 80.0–100.0)
Monocytes Absolute: 3.9 10*3/uL — ABNORMAL HIGH (ref 0.1–1.0)
Monocytes Relative: 13 %
Neutro Abs: 2.9 10*3/uL (ref 1.7–7.7)
Neutrophils Relative %: 10 %
Platelet Count: 95 10*3/uL — ABNORMAL LOW (ref 150–400)
RBC: 5.61 MIL/uL (ref 4.22–5.81)
RDW: 13.1 % (ref 11.5–15.5)
WBC Count: 30.2 10*3/uL — ABNORMAL HIGH (ref 4.0–10.5)
nRBC: 0 % (ref 0.0–0.2)

## 2019-11-29 LAB — SAVE SMEAR(SSMR), FOR PROVIDER SLIDE REVIEW

## 2019-11-29 LAB — URIC ACID: Uric Acid, Serum: 7.2 mg/dL (ref 3.7–8.6)

## 2019-11-29 MED ORDER — METHYLPREDNISOLONE 4 MG PO TBPK
ORAL_TABLET | ORAL | 1 refills | Status: DC
Start: 2019-11-29 — End: 2020-02-23

## 2019-11-29 MED ORDER — ALLOPURINOL 300 MG PO TABS
300.0000 mg | ORAL_TABLET | Freq: Every day | ORAL | 1 refills | Status: DC
Start: 2019-11-29 — End: 2019-12-05

## 2019-11-29 MED ORDER — VENETOCLAX 10 & 50 & 100 MG PO TBPK: 20.0000 mg | ORAL_TABLET | Freq: Every day | ORAL | 0 refills | Status: DC

## 2019-11-29 MED ORDER — FAMCICLOVIR 500 MG PO TABS
500.0000 mg | ORAL_TABLET | Freq: Every day | ORAL | 8 refills | Status: DC
Start: 2019-11-29 — End: 2020-01-02

## 2019-11-29 NOTE — Progress Notes (Signed)
Hematology and Oncology Follow Up Visit  David Whitehead [id number] 12-Feb-1971 49 y.o. 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 daily  Venetoclax 200 mg po q day -- start on 12/06/2019     Interim History:  David Whitehead is back for follow-up.  Unfortunately, looks like he does have progressive disease with respect to the CLL.  We did do scans on him.  The CT scan showed increased size number of lymph nodes throughout the neck, chest, abdomen and pelvis.  The largest lymph nodes are in the axilla and throughout the abdomen and pelvis.  His spleen is also enlarged.  I think were going to have to get him on treatment.  I really think that his CLL is being driven by the [id number].  He feels good.  He is still working.  He does have some swollen lymph nodes on the neck and in the axilla.  He does not think these have increased in size.  He is still taking Diflucan for the cryptococcal meningitis.  He will be on this for quite a while.  I think that a good option for [place] would be venetoclax.  This would be single agent therapy.  I want to try to avoid anything that is to immunosuppressive that might potentially reactivate the cryptococcal meningitis.  I realize that his white cell count is on the high side.  We will have to watch out for tumor lysis syndrome.  He will be put on allopurinol.  He will have to start off at the low-dose of venetoclax and then gradually work its way up.  There is been no problems with fever.  He has had a little bit of a rash on his abdomen.  Not on of this might be from the Diflucan.  He did see his dermatologist up in [place].  He had a biopsy of one of the nodules on his right hand.  Thankfully, this was not CLL.  He has had no change in bowel or bladder habits.  There is been no bleeding.  He has had no nausea or vomiting.  Currently,  I would say his performance status is probably ECOG 1.  Medications:  Current Outpatient Medications:  .  acetaminophen (TYLENOL) 500 MG tablet, Take 500 mg by mouth every 6 (six) hours as needed for mild pain., Disp: , Rfl:  .  cetirizine (ZYRTEC) 10 MG tablet, Take 10 mg by mouth daily., Disp: , Rfl:  .  famciclovir (FAMVIR) 500 MG tablet, Take 1 tablet (500 mg total) by mouth daily., Disp: 30 tablet, Rfl: 8 .  fluconazole (DIFLUCAN) 200 MG tablet, Take 1 tablet (200 mg total) by mouth daily., Disp: 30 tablet, Rfl: 10 .  methylPREDNISolone (MEDROL DOSEPAK) 4 MG TBPK tablet, Take as directed : 6-5-4-3-2-1, Disp: 21 tablet, Rfl: 1 .  Prenatal Vit-Fe Fumarate-FA (PRENATAL MULTIVITAMIN) TABS tablet, Take 1 tablet by mouth daily at 12 noon., Disp: , Rfl:  .  venetoclax 10 & 50 & 100 MG TBPK, Take 20 mg by mouth daily., Disp: 42 tablet, Rfl: 0  Allergies:  Allergies  Allergen Reactions  . Bactrim [Sulfamethoxazole-Trimethoprim] Rash  . Latex Other (See Comments)    Took skin off when removed tape  . Amoxicillin Rash    Has patient had a PCN reaction causing immediate rash, facial/tongue/throat swelling, SOB or lightheadedness with hypotension: No Has patient had a PCN  reaction causing severe rash involving mucus membranes or skin necrosis: No Has patient had a PCN reaction that required hospitalization: No Has patient had a PCN reaction occurring within the last 10 years: No If all of the above answers are "NO", then may proceed with Cephalosporin use.     Past Medical History, Surgical history, Social history, and Family History were reviewed and updated.  Review of Systems: Review of Systems  Constitutional: Negative.   HENT:  Negative.   Eyes: Negative.   Respiratory: Negative.   Cardiovascular: Negative.   Gastrointestinal: Negative.   Endocrine: Negative.   Genitourinary: Negative.    Musculoskeletal: Negative.   Skin: Negative.   Neurological: Negative.   Hematological:  Negative.   Psychiatric/Behavioral: Negative.     Physical Exam:  weight is 238 lb (108 kg). His oral temperature is 98.2 F (36.8 C). His blood pressure is 122/86 and his pulse is 89. His respiration is 18 and oxygen saturation is 100%.   Wt Readings from Last 3 Encounters:  11/29/19 238 lb (108 kg)  11/22/19 237 lb 3.2 oz (107.6 kg)  11/02/19 242 lb 1.9 oz (109.8 kg)    Physical Exam Vitals reviewed.  HENT:     Head: Normocephalic and atraumatic.  Eyes:     Pupils: Pupils are equal, round, and reactive to light.  Neck:     Comments: On his neck exam, he does have some adenopathy bilaterally in the posterior cervical chain.  There is also some adenopathy in the supraclavicular chain.  These are mobile and nontender.  They probably measure about 5-10 mm in size. Cardiovascular:     Rate and Rhythm: Normal rate and regular rhythm.     Heart sounds: Normal heart sounds.  Pulmonary:     Effort: Pulmonary effort is normal.     Breath sounds: Normal breath sounds.  Abdominal:     General: Bowel sounds are normal.     Palpations: Abdomen is soft.  Musculoskeletal:        General: No tenderness or deformity. Normal range of motion.     Cervical back: Normal range of motion.     Comments: On the dorsum of his right hand, he has 5 papular lesions.  They are nontender.  They are reddish in nature.  They probably measure about 5 mm in size.  Lymphadenopathy:     Cervical: No cervical adenopathy.  Skin:    General: Skin is warm and dry.     Findings: No erythema or rash.  Neurological:     Mental Status: He is alert and oriented to person, place, and time.  Psychiatric:        Behavior: Behavior normal.        Thought Content: Thought content normal.        Judgment: Judgment normal.      Lab Results  Component Value Date   WBC 30.2 (H) 11/29/2019   HGB 14.6 11/29/2019   HCT 45.1 11/29/2019   MCV 80.4 11/29/2019   PLT 95 (L) 11/29/2019     Chemistry      Component  Value Date/Time   NA 140 11/29/2019 1254   NA 140 03/08/2019 1438   K 4.0 11/29/2019 1254   CL 106 11/29/2019 1254   CO2 27 11/29/2019 1254   BUN 14 11/29/2019 1254   BUN 25 (H) 03/08/2019 1438   CREATININE 1.22 11/29/2019 1254   CREATININE 1.15 10/26/2019 0920      Component Value Date/Time  CALCIUM 9.4 11/29/2019 1254   ALKPHOS 61 11/29/2019 1254   AST 21 11/29/2019 1254   ALT 23 11/29/2019 1254   BILITOT 0.4 11/29/2019 1254      Impression and Plan: Mr. Eickhoff is a very nice 49 year old white male.  He has CLL.  I must say that he certainly is quite young for CLL.  He clearly has an aggressive CLL by virtue of the cytogenetics.  We will go ahead and see about getting him on the venetoclax.  Again we will have to taper him up.  He has the elevated white cell count.  I think he will do okay.  We will have to watch his blood counts weekly and monitor his electrolytes weekly.  [facility] will get this [facility] at [facility].  I really think that the venetoclax will help.  At some point, I am sure he will need something a more aggressive.  He is quite young.  Again he has the aggressive CLL by virtue of the 17p-abnormality.  I would like to try to get him back in about 6 weeks.  By then, he will would have been on the venetoclax for about a month.  Spent about 45 minutes with him today.  I answered all of his questions.  I went over venetoclax.  I would do think that this is a very reasonable option for him.   Volanda Napoleon, MD 7/20/20214:38 PM

## 2019-11-29 NOTE — Progress Notes (Signed)
Patient will be restarting treatment for his CLL. Patient had several questions about dosing, tolerability of medication and expected side effects. He states he is doing well with minimal complaints. He is still out of work. He's working with a specialist to apply for permanent disability.   [facility]ride would like his follow up lab work at [facility] which is closer to his home. Scheduler was able to schedule follow up lab there.   Patient knows to call with any questions or concerns. Business cards given for [employer] at the [facility].   Oncology Nurse Navigator Documentation  Oncology Nurse Navigator Flowsheets 11/29/2019  Abnormal Finding Date -  Confirmed Diagnosis Date -  Diagnosis Status -  Phase of Treatment -  Chemotherapy Actual Start Date: -  Expected Surgery Date -  Navigator Follow Up Date: 01/02/2020  Navigator Follow Up Reason: Follow-up Appointment  Navigator Location [facility]  Referral Date to RadOnc/MedOnc -  Navigator Encounter Type Follow-up Appt  Telephone -  Treatment Initiated Date -  Patient Visit Type MedOnc  Treatment Phase Active Tx  Barriers/Navigation Needs Coordination of International aid/development worker for Upcoming Surgery/ Treatment  Interventions Education;Psycho-Social Support  Acuity Level 2-Minimal Needs (1-2 Barriers Identified)  Referrals -  Coordination of Care -  Education Method Verbal  Support Groups/Services Friends and Family  Time Spent with Patient 82

## 2019-11-29 NOTE — Telephone Encounter (Signed)
Appointments scheduled patient will get date/time from My Chart.  Also in basket mesasge sent to Hilario Quarry, RN regarding lab appt to be scheduled at [facility]

## 2019-11-30 ENCOUNTER — Encounter: Payer: Self-pay | Admitting: Pharmacist

## 2019-11-30 ENCOUNTER — Telehealth: Payer: Self-pay | Admitting: Pharmacy Technician

## 2019-11-30 ENCOUNTER — Telehealth: Payer: Self-pay | Admitting: Pharmacist

## 2019-11-30 DIAGNOSIS — C9110 Chronic lymphocytic leukemia of B-cell type not having achieved remission: Secondary | ICD-10-CM

## 2019-11-30 LAB — PROTEIN ELECTROPHORESIS, SERUM, WITH REFLEX
A/G Ratio: 1.8 — ABNORMAL HIGH (ref 0.7–1.7)
Albumin ELP: 3.9 g/dL (ref 2.9–4.4)
Alpha-1-Globulin: 0.1 g/dL (ref 0.0–0.4)
Alpha-2-Globulin: 0.5 g/dL (ref 0.4–1.0)
Beta Globulin: 0.9 g/dL (ref 0.7–1.3)
Gamma Globulin: 0.7 g/dL (ref 0.4–1.8)
Globulin, Total: 2.2 g/dL (ref 2.2–3.9)
Total Protein ELP: 6.1 g/dL (ref 6.0–8.5)

## 2019-11-30 LAB — KAPPA/LAMBDA LIGHT CHAINS
Kappa free light chain: 266.6 mg/L — ABNORMAL HIGH (ref 3.3–19.4)
Kappa, lambda light chain ratio: 66.65 — ABNORMAL HIGH (ref 0.26–1.65)
Lambda free light chains: 4 mg/L — ABNORMAL LOW (ref 5.7–26.3)

## 2019-11-30 LAB — IGG, IGA, IGM
IgA: 7 mg/dL — ABNORMAL LOW (ref 90–386)
IgG (Immunoglobin G), Serum: 807 mg/dL (ref 603–1613)
IgM (Immunoglobulin M), Srm: <5 mg/dL — ABNORMAL LOW (ref 20–172)

## 2019-11-30 MED ORDER — VENETOCLAX 10 & 50 & 100 MG PO TBPK: ORAL_TABLET | ORAL | 0 refills | Status: DC

## 2019-11-30 NOTE — Telephone Encounter (Signed)
Encounter opened in error

## 2019-11-30 NOTE — Telephone Encounter (Signed)
Oral Oncology Patient Advocate Encounter  Prior Authorization for [employer] Pack has been approved.    PA# none  Effective dates: 11/30/19 until 05/11/20 (approved for 1 time fill within 180 days for starter pack)  Patients co-pay is $0.00  Oral Oncology Clinic will continue to follow.   [place] Patient [place] Phone [phone] Fax [phone] 11/30/2019 10:33 AM

## 2019-11-30 NOTE — Telephone Encounter (Signed)
Oral Oncology Pharmacist Encounter  Received new prescription for Venclexta (venetoclax) for the treatment of CLL with 17p deletion, planned duration until disease progression or unacceptable drug toxicity.  Labs from 7/20 assessed. Please note that potassium is 4.0, uric acid is at higher end of range at 7.2, and creatinine is near baseline at 1.22 (eCrCl ~78 ml/min). WBC count is elevated at 30.2, ALC 22.7, platelets low at 95. Tumor lysis syndrome should be monitored in this patient. Weekly labs have been ordered per MD. Prescription dose and frequency assessed.   Current medication list in Epic reviewed, there is a DDI with venetoclax and fluconazole (moderate CYP3A4 inhibitor). Fluconazole may increase venetoclax levels by 2-4 fold and therefore increase venetoclax toxicities, including TLS. MD notified.   Note allopurinol already prescribed.   Prescription has been e-scribed to the [facility] for benefits analysis and approval.  Oral Oncology Clinic will continue to follow for insurance authorization, copayment issues, initial counseling and start date.  Eddie Candle, PharmD PGY2 Hematology/Oncology Pharmacy Resident Oral Chemotherapy Navigation Clinic 11/30/2019 1:55 PM

## 2019-11-30 NOTE — Addendum Note (Signed)
Addended by: Darl Pikes on: 11/30/2019 03:49 PM   Modules accepted: Orders

## 2019-11-30 NOTE — Telephone Encounter (Signed)
Oral Oncology Patient Advocate Encounter  Received notification from [employer] that prior authorization for Venclexta is required.  PA submitted on CoverMyMeds Key [id number]  Status is pending  Oral Oncology Clinic will continue to follow.  [place] Patient [place] Phone [phone] Fax [phone] 11/30/2019 10:03 AM

## 2019-12-01 MED FILL — VENCLEXTA STARTING PACK: 10 & 50 & 1 | 28 days supply | Qty: 42 | Fill #0

## 2019-12-01 NOTE — Telephone Encounter (Signed)
Oral Chemotherapy Pharmacist Encounter  Patient Education I spoke with patient for overview of new oral chemotherapy medication: Venclexta (venetoclax) for the treatment of CLL with 17p deletion, planned duration until disease progression or unacceptable drug toxicity.  Pt is doing well. Counseled patient on administration, dosing, side effects, monitoring, drug-food interactions, safe handling, storage, and disposal.  Patient will take 20 mg by mouth daily for 7 days, THEN 50 mg daily for 7 days, THEN 100 mg daily for 7 days, THEN 200 mg daily for 7 days. Medication will be taken with food. Per MD, will likely cap dose escalation at 200 mg due to current drug interaction with fluconazole. Pending further treatment of patient's cryptococcal meningitis per infectious disease, will assess further dosing of venetoclax. Patient aware of drug interaction and to notify oral chemo clinic about any changes in fluconazole.   Side effects include but not limited to: myelosuppression, tumor lysis syndrome, diarrhea, and N/V.    Reviewed with patient importance of keeping a medication schedule and plan for any missed doses.  David Whitehead voiced understanding and appreciation. All questions answered.  Provided patient with [facility] phone number. Patient knows to call the office with questions or concerns. Oral Chemotherapy Navigation [facility] will continue to follow.  Eddie Candle, PharmD PGY2 Hematology/Oncology Pharmacy Resident Oral Chemotherapy Navigation Clinic 12/01/2019 3:20 PM

## 2019-12-02 ENCOUNTER — Other Ambulatory Visit: Payer: BC Managed Care – PPO

## 2019-12-02 ENCOUNTER — Ambulatory Visit: Payer: BC Managed Care – PPO | Admitting: Hematology & Oncology

## 2019-12-05 ENCOUNTER — Encounter: Payer: Self-pay | Admitting: *Deleted

## 2019-12-05 ENCOUNTER — Other Ambulatory Visit: Payer: Self-pay | Admitting: *Deleted

## 2019-12-05 ENCOUNTER — Encounter: Payer: Self-pay | Admitting: Hematology & Oncology

## 2019-12-05 NOTE — Telephone Encounter (Signed)
Oral Oncology Patient Advocate Encounter  I spoke with David Whitehead on 12/01/19 to set up delivery of Venclexta.  Address verified for shipment.  Venclexta will be filled through [facility] and mailed 12/01/19 for delivery 12/02/19.    [name] will call 7-10 days before next refill is due to complete adherence call and set up delivery of medication.     [place] [facility] [name] Phone [phone] Fax [phone] 12/05/2019 8:50 AM

## 2019-12-08 ENCOUNTER — Encounter: Payer: Self-pay | Admitting: *Deleted

## 2019-12-08 NOTE — Progress Notes (Signed)
Called to check patient after he had reaction to Allopurinol over the weekend, and started new treatment with Venetoclax on Tuesday.   Patient's rash has completely cleared after he took steroids. His reaction occurred just a few hours after taking the allopurinol and was similar to his previous encounter with this medication. Will add allopurinol to the patient's allergy list.   Irl started taking Venetoclax on Tuesday. He has had no side effects that he has noticed. Reviewed the need for him to stay well hydrated especially now since patient is no longer taking the allopurinol. He confirms that he is drinking approximately 100 ounces of water daily and understands the importance. He confirms his appointment next Tuesday at [facility] to have labs drawn.  He is scheduled 12/28/2019 for his last IVIG home infusion.   Encouraged patient to call the office with any questions or concerns.

## 2019-12-13 ENCOUNTER — Inpatient Hospital Stay (HOSPITAL_COMMUNITY): Payer: BC Managed Care – PPO | Attending: Hematology

## 2019-12-13 ENCOUNTER — Other Ambulatory Visit: Payer: Self-pay

## 2019-12-13 DIAGNOSIS — C9110 Chronic lymphocytic leukemia of B-cell type not having achieved remission: Secondary | ICD-10-CM | POA: Insufficient documentation

## 2019-12-13 LAB — CBC WITH DIFFERENTIAL/PLATELET
Abs Immature Granulocytes: 0.07 10*3/uL (ref 0.00–0.07)
Basophils Absolute: 0.2 10*3/uL — ABNORMAL HIGH (ref 0.0–0.1)
Basophils Relative: 1 %
Eosinophils Absolute: 0.5 10*3/uL (ref 0.0–0.5)
Eosinophils Relative: 2 %
HCT: 43.9 % (ref 39.0–52.0)
Hemoglobin: 13.9 g/dL (ref 13.0–17.0)
Immature Granulocytes: 0 %
Lymphocytes Relative: 74 %
Lymphs Abs: 19.5 10*3/uL — ABNORMAL HIGH (ref 0.7–4.0)
MCH: 26.3 pg (ref 26.0–34.0)
MCHC: 31.7 g/dL (ref 30.0–36.0)
MCV: 83 fL (ref 80.0–100.0)
Monocytes Absolute: 4.1 10*3/uL — ABNORMAL HIGH (ref 0.1–1.0)
Monocytes Relative: 15 %
Neutro Abs: 2.3 10*3/uL (ref 1.7–7.7)
Neutrophils Relative %: 8 %
Platelets: 85 10*3/uL — ABNORMAL LOW (ref 150–400)
RBC: 5.29 MIL/uL (ref 4.22–5.81)
RDW: 13.5 % (ref 11.5–15.5)
WBC: 26.6 10*3/uL — ABNORMAL HIGH (ref 4.0–10.5)
nRBC: 0.1 % (ref 0.0–0.2)

## 2019-12-13 LAB — COMPREHENSIVE METABOLIC PANEL
ALT: 30 U/L (ref 0–44)
AST: 26 U/L (ref 15–41)
Albumin: 4.3 g/dL (ref 3.5–5.0)
Alkaline Phosphatase: 59 U/L (ref 38–126)
Anion gap: 7 (ref 5–15)
BUN: 15 mg/dL (ref 6–20)
CO2: 24 mmol/L (ref 22–32)
Calcium: 8.9 mg/dL (ref 8.9–10.3)
Chloride: 107 mmol/L (ref 98–111)
Creatinine, Ser: 1.25 mg/dL — ABNORMAL HIGH (ref 0.61–1.24)
GFR calc Af Amer: >60 mL/min (ref >60)
GFR calc non Af Amer: >60 mL/min (ref >60)
Glucose, Bld: 96 mg/dL (ref 70–99)
Potassium: 4.3 mmol/L (ref 3.5–5.1)
Sodium: 138 mmol/L (ref 135–145)
Total Bilirubin: 0.5 mg/dL (ref 0.3–1.2)
Total Protein: 6.7 g/dL (ref 6.5–8.1)

## 2019-12-13 LAB — URIC ACID: Uric Acid, Serum: 7.6 mg/dL (ref 3.7–8.6)

## 2019-12-19 ENCOUNTER — Encounter: Payer: Self-pay | Admitting: *Deleted

## 2019-12-19 ENCOUNTER — Other Ambulatory Visit: Payer: Self-pay | Admitting: *Deleted

## 2019-12-19 ENCOUNTER — Encounter: Payer: Self-pay | Admitting: Hematology & Oncology

## 2019-12-19 DIAGNOSIS — B029 Zoster without complications: Secondary | ICD-10-CM

## 2019-12-19 MED ORDER — FAMCICLOVIR 500 MG PO TABS: 500.0000 mg | ORAL_TABLET | Freq: Three times a day (TID) | ORAL | 0 refills | Status: AC

## 2019-12-19 NOTE — Progress Notes (Signed)
Oncology Nurse Navigator Documentation  Oncology Nurse Navigator Flowsheets 12/19/2019  Abnormal Finding Date -  Confirmed Diagnosis Date -  Diagnosis Status -  Phase of Treatment -  Chemotherapy Actual Start Date: -  Expected Surgery Date -  Navigator Follow Up Date: 01/02/2020  Navigator Follow Up Reason: Follow-up Appointment  Navigator Location [facility]  Referral Date to RadOnc/MedOnc -  Navigator Encounter Type MyChart  Telephone Symptom Mgt  Treatment Initiated Date -  Patient Visit Type MedOnc  Treatment Phase Active Tx  Barriers/Navigation Needs Coordination of Care;Education  Education Pain/ Symptom Management  Interventions Education;Psycho-Social Support  Acuity Level 2-Minimal Needs (1-2 Barriers Identified)  Referrals -  Coordination of Care -  Education Method Written  Support Groups/Services Friends and Family  Time Spent with Patient 60

## 2019-12-20 ENCOUNTER — Other Ambulatory Visit: Payer: Self-pay

## 2019-12-20 ENCOUNTER — Inpatient Hospital Stay (HOSPITAL_COMMUNITY): Payer: BC Managed Care – PPO

## 2019-12-20 DIAGNOSIS — C9110 Chronic lymphocytic leukemia of B-cell type not having achieved remission: Secondary | ICD-10-CM | POA: Diagnosis not present

## 2019-12-20 LAB — COMPREHENSIVE METABOLIC PANEL
ALT: 26 U/L (ref 0–44)
AST: 24 U/L (ref 15–41)
Albumin: 4.4 g/dL (ref 3.5–5.0)
Alkaline Phosphatase: 52 U/L (ref 38–126)
Anion gap: 8 (ref 5–15)
BUN: 17 mg/dL (ref 6–20)
CO2: 22 mmol/L (ref 22–32)
Calcium: 9.1 mg/dL (ref 8.9–10.3)
Chloride: 106 mmol/L (ref 98–111)
Creatinine, Ser: 1.16 mg/dL (ref 0.61–1.24)
GFR calc Af Amer: >60 mL/min (ref >60)
GFR calc non Af Amer: >60 mL/min (ref >60)
Glucose, Bld: 125 mg/dL — ABNORMAL HIGH (ref 70–99)
Potassium: 3.9 mmol/L (ref 3.5–5.1)
Sodium: 136 mmol/L (ref 135–145)
Total Bilirubin: 0.5 mg/dL (ref 0.3–1.2)
Total Protein: 6.7 g/dL (ref 6.5–8.1)

## 2019-12-20 LAB — CBC WITH DIFFERENTIAL/PLATELET
Abs Immature Granulocytes: 0.02 10*3/uL (ref 0.00–0.07)
Basophils Absolute: 0.1 10*3/uL (ref 0.0–0.1)
Basophils Relative: 1 %
Eosinophils Absolute: 0.4 10*3/uL (ref 0.0–0.5)
Eosinophils Relative: 3 %
HCT: 45.2 % (ref 39.0–52.0)
Hemoglobin: 14.6 g/dL (ref 13.0–17.0)
Immature Granulocytes: 0 %
Lymphocytes Relative: 61 %
Lymphs Abs: 8.4 10*3/uL — ABNORMAL HIGH (ref 0.7–4.0)
MCH: 26.4 pg (ref 26.0–34.0)
MCHC: 32.3 g/dL (ref 30.0–36.0)
MCV: 81.7 fL (ref 80.0–100.0)
Monocytes Absolute: 2.3 10*3/uL — ABNORMAL HIGH (ref 0.1–1.0)
Monocytes Relative: 17 %
Neutro Abs: 2.4 10*3/uL (ref 1.7–7.7)
Neutrophils Relative %: 18 %
Platelets: 89 10*3/uL — ABNORMAL LOW (ref 150–400)
RBC: 5.53 MIL/uL (ref 4.22–5.81)
RDW: 13.3 % (ref 11.5–15.5)
WBC: 13.6 10*3/uL — ABNORMAL HIGH (ref 4.0–10.5)
nRBC: 0 % (ref 0.0–0.2)

## 2019-12-20 LAB — URIC ACID: Uric Acid, Serum: 7.4 mg/dL (ref 3.7–8.6)

## 2019-12-22 DIAGNOSIS — C9110 Chronic lymphocytic leukemia of B-cell type not having achieved remission: Secondary | ICD-10-CM | POA: Diagnosis not present

## 2019-12-23 ENCOUNTER — Other Ambulatory Visit: Payer: Self-pay | Admitting: Hematology & Oncology

## 2019-12-23 DIAGNOSIS — C9110 Chronic lymphocytic leukemia of B-cell type not having achieved remission: Secondary | ICD-10-CM

## 2019-12-27 ENCOUNTER — Other Ambulatory Visit: Payer: Self-pay | Admitting: *Deleted

## 2019-12-27 ENCOUNTER — Other Ambulatory Visit: Payer: Self-pay

## 2019-12-27 ENCOUNTER — Other Ambulatory Visit: Payer: Self-pay | Admitting: Hematology & Oncology

## 2019-12-27 ENCOUNTER — Inpatient Hospital Stay (HOSPITAL_COMMUNITY): Payer: BC Managed Care – PPO

## 2019-12-27 DIAGNOSIS — C9110 Chronic lymphocytic leukemia of B-cell type not having achieved remission: Secondary | ICD-10-CM

## 2019-12-27 LAB — CBC WITH DIFFERENTIAL/PLATELET
Abs Immature Granulocytes: 0.01 10*3/uL (ref 0.00–0.07)
Basophils Absolute: 0.1 10*3/uL (ref 0.0–0.1)
Basophils Relative: 1 %
Eosinophils Absolute: 0.2 10*3/uL (ref 0.0–0.5)
Eosinophils Relative: 3 %
HCT: 43.7 % (ref 39.0–52.0)
Hemoglobin: 14.1 g/dL (ref 13.0–17.0)
Immature Granulocytes: 0 %
Lymphocytes Relative: 54 %
Lymphs Abs: 4.1 10*3/uL — ABNORMAL HIGH (ref 0.7–4.0)
MCH: 26.5 pg (ref 26.0–34.0)
MCHC: 32.3 g/dL (ref 30.0–36.0)
MCV: 82.1 fL (ref 80.0–100.0)
Monocytes Absolute: 1 10*3/uL (ref 0.1–1.0)
Monocytes Relative: 13 %
Neutro Abs: 2.3 10*3/uL (ref 1.7–7.7)
Neutrophils Relative %: 29 %
Platelets: 130 10*3/uL — ABNORMAL LOW (ref 150–400)
RBC: 5.32 MIL/uL (ref 4.22–5.81)
RDW: 13.3 % (ref 11.5–15.5)
WBC: 7.8 10*3/uL (ref 4.0–10.5)
nRBC: 0 % (ref 0.0–0.2)

## 2019-12-27 LAB — URIC ACID: Uric Acid, Serum: 7.1 mg/dL (ref 3.7–8.6)

## 2019-12-27 LAB — COMPREHENSIVE METABOLIC PANEL
ALT: 22 U/L (ref 0–44)
AST: 22 U/L (ref 15–41)
Albumin: 4.2 g/dL (ref 3.5–5.0)
Alkaline Phosphatase: 40 U/L (ref 38–126)
Anion gap: 8 (ref 5–15)
BUN: 14 mg/dL (ref 6–20)
CO2: 22 mmol/L (ref 22–32)
Calcium: 8.9 mg/dL (ref 8.9–10.3)
Chloride: 108 mmol/L (ref 98–111)
Creatinine, Ser: 1.06 mg/dL (ref 0.61–1.24)
GFR calc Af Amer: >60 mL/min (ref >60)
GFR calc non Af Amer: >60 mL/min (ref >60)
Glucose, Bld: 142 mg/dL — ABNORMAL HIGH (ref 70–99)
Potassium: 3.8 mmol/L (ref 3.5–5.1)
Sodium: 138 mmol/L (ref 135–145)
Total Bilirubin: 0.6 mg/dL (ref 0.3–1.2)
Total Protein: 6.5 g/dL (ref 6.5–8.1)

## 2019-12-27 MED ORDER — VENETOCLAX 100 MG PO TABS: 200.0000 mg | ORAL_TABLET | Freq: Every day | ORAL | 6 refills | Status: DC

## 2019-12-27 MED FILL — VENCLEXTA 100 MG TABS: 100 | 30 days supply | Qty: 60 | Fill #0

## 2020-01-02 ENCOUNTER — Other Ambulatory Visit: Payer: Self-pay

## 2020-01-02 ENCOUNTER — Encounter: Payer: Self-pay | Admitting: Hematology & Oncology

## 2020-01-02 ENCOUNTER — Inpatient Hospital Stay (HOSPITAL_BASED_OUTPATIENT_CLINIC_OR_DEPARTMENT_OTHER): Payer: BC Managed Care – PPO | Admitting: Hematology & Oncology

## 2020-01-02 ENCOUNTER — Inpatient Hospital Stay: Payer: BC Managed Care – PPO | Attending: Hematology

## 2020-01-02 VITALS — BP 142/95 | HR 94 | Temp 98.4°F | Resp 18 | Wt 232.0 lb

## 2020-01-02 DIAGNOSIS — C9110 Chronic lymphocytic leukemia of B-cell type not having achieved remission: Secondary | ICD-10-CM | POA: Diagnosis not present

## 2020-01-02 DIAGNOSIS — B451 Cerebral cryptococcosis: Secondary | ICD-10-CM | POA: Diagnosis not present

## 2020-01-02 DIAGNOSIS — Z79899 Other long term (current) drug therapy: Secondary | ICD-10-CM | POA: Diagnosis not present

## 2020-01-02 DIAGNOSIS — Z7952 Long term (current) use of systemic steroids: Secondary | ICD-10-CM | POA: Diagnosis not present

## 2020-01-02 LAB — CBC WITH DIFFERENTIAL/PLATELET
Abs Immature Granulocytes: 0.01 10*3/uL (ref 0.00–0.07)
Basophils Absolute: 0.1 10*3/uL (ref 0.0–0.1)
Basophils Relative: 1 %
Eosinophils Absolute: 0.1 10*3/uL (ref 0.0–0.5)
Eosinophils Relative: 2 %
HCT: 43.6 % (ref 39.0–52.0)
Hemoglobin: 14.1 g/dL (ref 13.0–17.0)
Immature Granulocytes: 0 %
Lymphocytes Relative: 56 %
Lymphs Abs: 2.8 10*3/uL (ref 0.7–4.0)
MCH: 26.3 pg (ref 26.0–34.0)
MCHC: 32.3 g/dL (ref 30.0–36.0)
MCV: 81.2 fL (ref 80.0–100.0)
Monocytes Absolute: 0.4 10*3/uL (ref 0.1–1.0)
Monocytes Relative: 7 %
Neutro Abs: 1.7 10*3/uL (ref 1.7–7.7)
Neutrophils Relative %: 34 %
Platelets: 132 10*3/uL — ABNORMAL LOW (ref 150–400)
RBC: 5.37 MIL/uL (ref 4.22–5.81)
RDW: 13.1 % (ref 11.5–15.5)
WBC: 5 10*3/uL (ref 4.0–10.5)
nRBC: 0 % (ref 0.0–0.2)

## 2020-01-02 LAB — COMPREHENSIVE METABOLIC PANEL
ALT: 16 U/L (ref 0–44)
AST: 19 U/L (ref 15–41)
Albumin: 4.6 g/dL (ref 3.5–5.0)
Alkaline Phosphatase: 43 U/L (ref 38–126)
Anion gap: 6 (ref 5–15)
BUN: 21 mg/dL — ABNORMAL HIGH (ref 6–20)
CO2: 26 mmol/L (ref 22–32)
Calcium: 9.5 mg/dL (ref 8.9–10.3)
Chloride: 109 mmol/L (ref 98–111)
Creatinine, Ser: 1.31 mg/dL — ABNORMAL HIGH (ref 0.61–1.24)
GFR calc Af Amer: >60 mL/min (ref >60)
GFR calc non Af Amer: >60 mL/min (ref >60)
Glucose, Bld: 78 mg/dL (ref 70–99)
Potassium: 4.1 mmol/L (ref 3.5–5.1)
Sodium: 141 mmol/L (ref 135–145)
Total Bilirubin: 0.3 mg/dL (ref 0.3–1.2)
Total Protein: 7.1 g/dL (ref 6.5–8.1)

## 2020-01-02 LAB — URIC ACID: Uric Acid, Serum: 8 mg/dL (ref 3.7–8.6)

## 2020-01-02 LAB — LACTATE DEHYDROGENASE: LDH: 212 U/L — ABNORMAL HIGH (ref 98–192)

## 2020-01-02 MED ORDER — ACYCLOVIR 800 MG PO TABS: 800.0000 mg | ORAL_TABLET | Freq: Every day | ORAL | 2 refills | Status: DC

## 2020-01-02 NOTE — Progress Notes (Signed)
Hematology and Oncology Follow Up Visit  David Whitehead [id number] [birth year] 49 y.o. 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 daily  Venetoclax 200 mg po q day -- start on 12/06/2019     Interim History:  David Whitehead is back for follow-up.  He started the venetoclax.  He is on 200 mg a day now.  He has had no problems with this.  The problem that we might have is the fact that he developed zoster.  This appeared to be in the left T4 dermatome.  However, looks like he does have vesicular lesions elsewhere.  He has been on Famvir at 500 mg p.o. 3 times daily.  Is been on this now for 2 weeks.  These lesions certainly do not appear to be improving.  He does not think that they are getting worse.  I did culture couple of the lesions.  We will try to send off PCR on these and see what we have.  I do worry about this being somewhat disseminated.  He is somewhat immunocompromised.  As such, will probably have to give him treatment up to 800 mg p.o. 5 times a day.  I will have him on this for at least 10 days.  I told him if this gets worse, he is going had to be in the hospital and get IV acyclovir.  He has had no problems with Diflucan.  He had the cryptococcal meningitis.  He has had no fever.  He has had no bleeding.  Is been no change in bowel or bladder habits.  He has had no leg swelling.  There is no cough or shortness of breath.  He has had no visual changes.  Overall, I would say his performance status is ECOG 0.    Medications:  Current Outpatient Medications:  .  acetaminophen (TYLENOL) 500 MG tablet, Take 500 mg by mouth every 6 (six) hours as needed for mild pain., Disp: , Rfl:  .  cetirizine (ZYRTEC) 10 MG tablet, Take 10 mg by mouth daily., Disp: , Rfl:  .  famciclovir (FAMVIR) 500 MG tablet, Take 1 tablet (500 mg total) by mouth daily., Disp: 30  tablet, Rfl: 8 .  fluconazole (DIFLUCAN) 200 MG tablet, Take 1 tablet (200 mg total) by mouth daily., Disp: 30 tablet, Rfl: 10 .  methylPREDNISolone (MEDROL DOSEPAK) 4 MG TBPK tablet, Take as directed : 6-5-4-3-2-1, Disp: 21 tablet, Rfl: 1 .  Prenatal Vit-Fe Fumarate-FA (PRENATAL MULTIVITAMIN) TABS tablet, Take 1 tablet by mouth daily at 12 noon., Disp: , Rfl:  .  VENCLEXTA STARTING PACK 10 & 50 & 100 MG TBPK, TAKE 20 MG BY MOUTH DAILY FOR 7 DAYS, THEN 50 MG DAILY FOR 7 DAYS, THEN 100 MG DAILY FOR 7 DAYS, THEN 200 MG DAILY FOR 7 DAYS., Disp: 42 tablet, Rfl: 0 .  venetoclax 100 MG TABS, Take 200 mg by mouth daily., Disp: 60 tablet, Rfl: 6  Allergies:  Allergies  Allergen Reactions  . Bactrim [Sulfamethoxazole-Trimethoprim] Rash  . Latex Other (See Comments)    Took skin off when removed tape  . Allopurinol Itching and Rash  . Amoxicillin Rash    Has patient had a PCN reaction causing immediate rash, facial/tongue/throat swelling, SOB or lightheadedness with hypotension: No Has patient had a PCN reaction causing severe rash involving mucus membranes or skin necrosis: No Has patient had a PCN  reaction that required hospitalization: No Has patient had a PCN reaction occurring within the last 10 years: No If all of the above answers are "NO", then may proceed with Cephalosporin use.     Past Medical History, Surgical history, Social history, and Family History were reviewed and updated.  Review of Systems: Review of Systems  Constitutional: Negative.   HENT:  Negative.   Eyes: Negative.   Respiratory: Negative.   Cardiovascular: Negative.   Gastrointestinal: Negative.   Endocrine: Negative.   Genitourinary: Negative.    Musculoskeletal: Negative.   Skin: Negative.   Neurological: Negative.   Hematological: Negative.   Psychiatric/Behavioral: Negative.     Physical Exam:  weight is 232 lb (105.2 kg). His oral temperature is 98.4 F (36.9 C). His blood pressure is 142/95  (abnormal) and his pulse is 94. His respiration is 18 and oxygen saturation is 100%.   Wt Readings from Last 3 Encounters:  01/02/20 232 lb (105.2 kg)  11/29/19 238 lb (108 kg)  11/22/19 237 lb 3.2 oz (107.6 kg)    Physical Exam Vitals reviewed.  HENT:     Head: Normocephalic and atraumatic.  Eyes:     Pupils: Pupils are equal, round, and reactive to light.  Neck:     Comments: On his neck exam, he does have some adenopathy bilaterally in the posterior cervical chain.  There is also some adenopathy in the supraclavicular chain.  These are mobile and nontender.  They probably measure about 5-10 mm in size. Cardiovascular:     Rate and Rhythm: Normal rate and regular rhythm.     Heart sounds: Normal heart sounds.  Pulmonary:     Effort: Pulmonary effort is normal.     Breath sounds: Normal breath sounds.  Abdominal:     General: Bowel sounds are normal.     Palpations: Abdomen is soft.  Musculoskeletal:        General: No tenderness or deformity. Normal range of motion.     Cervical back: Normal range of motion.     Comments: On the dorsum of his right hand, he has 5 papular lesions.  They are nontender.  They are reddish in nature.  They probably measure about 5 mm in size.  Lymphadenopathy:     Cervical: No cervical adenopathy.  Skin:    General: Skin is warm and dry.     Findings: No erythema or rash.  Neurological:     Mental Status: He is alert and oriented to person, place, and time.  Psychiatric:        Behavior: Behavior normal.        Thought Content: Thought content normal.        Judgment: Judgment normal.      Lab Results  Component Value Date   WBC 5.0 01/02/2020   HGB 14.1 01/02/2020   HCT 43.6 01/02/2020   MCV 81.2 01/02/2020   PLT 132 (L) 01/02/2020     Chemistry      Component Value Date/Time   NA 141 01/02/2020 1148   NA 140 03/08/2019 1438   K 4.1 01/02/2020 1148   CL 109 01/02/2020 1148   CO2 26 01/02/2020 1148   BUN 21 (H) 01/02/2020 1148    BUN 25 (H) 03/08/2019 1438   CREATININE 1.31 (H) 01/02/2020 1148   CREATININE 1.22 11/29/2019 1254   CREATININE 1.15 10/26/2019 0920      Component Value Date/Time   CALCIUM 9.5 01/02/2020 1148   ALKPHOS 43 01/02/2020 1148  AST 19 01/02/2020 1148   AST 21 11/29/2019 1254   ALT 16 01/02/2020 1148   ALT 23 11/29/2019 1254   BILITOT 0.3 01/02/2020 1148   BILITOT 0.4 11/29/2019 1254      Impression and Plan:  Mr. Splitt is a very nice 49 year old white male.  He has CLL.  I must say that he certainly is quite young for CLL.  He clearly has an aggressive CLL by virtue of the cytogenetics.  Again, I am little worried about this zoster that he has.  Hopefully, this will is not systemic.  Again he has had chickenpox when he was younger he says.  We will see how the acyclovir does for him.  I told him to stop the venetoclax for right now.  I do not want to see his white cell count get lower.  I would like to see him back in 7-10 days.  This is going to be helpful for her.  I would like to think that this is truly a zoster, that these lesions will dry up.   Again, he knows to give [place] a call if things are getting worse.  Volanda Napoleon, MD 8/23/202112:32 PM

## 2020-01-03 ENCOUNTER — Encounter: Payer: Self-pay | Admitting: *Deleted

## 2020-01-03 LAB — PROTEIN ELECTROPHORESIS, SERUM, WITH REFLEX
A/G Ratio: 1.2 (ref 0.7–1.7)
Albumin ELP: 3.8 g/dL (ref 2.9–4.4)
Alpha-1-Globulin: 0.2 g/dL (ref 0.0–0.4)
Alpha-2-Globulin: 0.7 g/dL (ref 0.4–1.0)
Beta Globulin: 1 g/dL (ref 0.7–1.3)
Gamma Globulin: 1.3 g/dL (ref 0.4–1.8)
Globulin, Total: 3.1 g/dL (ref 2.2–3.9)
Total Protein ELP: 6.9 g/dL (ref 6.0–8.5)

## 2020-01-03 LAB — KAPPA/LAMBDA LIGHT CHAINS
Kappa free light chain: 82.6 mg/L — ABNORMAL HIGH (ref 3.3–19.4)
Kappa, lambda light chain ratio: 19.21 — ABNORMAL HIGH (ref 0.26–1.65)
Lambda free light chains: 4.3 mg/L — ABNORMAL LOW (ref 5.7–26.3)

## 2020-01-03 LAB — IGG, IGA, IGM
IgA: 8 mg/dL — ABNORMAL LOW (ref 90–386)
IgG (Immunoglobin G), Serum: 1210 mg/dL (ref 603–1613)
IgM (Immunoglobulin M), Srm: <5 mg/dL — ABNORMAL LOW (ref 20–172)

## 2020-01-03 NOTE — Progress Notes (Signed)
Oncology Nurse Navigator Documentation  Oncology Nurse Navigator Flowsheets 01/03/2020  Abnormal Finding Date -  Confirmed Diagnosis Date -  Diagnosis Status -  Phase of Treatment -  Chemotherapy Actual Start Date: -  Expected Surgery Date -  Navigator Follow Up Date: 01/09/2020  Navigator Follow Up Reason: Follow-up Appointment  Navigator Location [facility]  Referral Date to RadOnc/MedOnc -  Navigator Encounter Type Appt/Treatment Plan Review  Telephone -  Treatment Initiated Date -  Patient Visit Type MedOnc  Treatment Phase Active Tx  Barriers/Navigation Needs Coordination of Care;Education  Education -  Interventions None Required  Acuity Level 2-Minimal Needs (1-2 Barriers Identified)  Referrals -  Coordination of Care -  Education Method -  Support Groups/Services -  Time Spent with Patient 15

## 2020-01-06 LAB — HSV AND VZV PCR PANEL
HSV 1 DNA: NEGATIVE
HSV 2 DNA: NEGATIVE
Varicella-Zoster, PCR: NEGATIVE

## 2020-01-09 ENCOUNTER — Inpatient Hospital Stay: Payer: BC Managed Care – PPO

## 2020-01-09 ENCOUNTER — Inpatient Hospital Stay (HOSPITAL_COMMUNITY): Payer: BC Managed Care – PPO

## 2020-01-09 ENCOUNTER — Encounter: Payer: Self-pay | Admitting: *Deleted

## 2020-01-09 ENCOUNTER — Inpatient Hospital Stay (HOSPITAL_BASED_OUTPATIENT_CLINIC_OR_DEPARTMENT_OTHER): Payer: BC Managed Care – PPO | Admitting: Hematology & Oncology

## 2020-01-09 ENCOUNTER — Other Ambulatory Visit: Payer: Self-pay

## 2020-01-09 ENCOUNTER — Encounter: Payer: Self-pay | Admitting: Hematology & Oncology

## 2020-01-09 VITALS — BP 144/94 | HR 88 | Temp 98.3°F | Resp 16 | Wt 234.0 lb

## 2020-01-09 DIAGNOSIS — Z7952 Long term (current) use of systemic steroids: Secondary | ICD-10-CM | POA: Diagnosis not present

## 2020-01-09 DIAGNOSIS — C9110 Chronic lymphocytic leukemia of B-cell type not having achieved remission: Secondary | ICD-10-CM | POA: Diagnosis not present

## 2020-01-09 DIAGNOSIS — B451 Cerebral cryptococcosis: Secondary | ICD-10-CM | POA: Diagnosis not present

## 2020-01-09 DIAGNOSIS — Z79899 Other long term (current) drug therapy: Secondary | ICD-10-CM | POA: Diagnosis not present

## 2020-01-09 LAB — CBC WITH DIFFERENTIAL (CANCER CENTER ONLY)
Abs Immature Granulocytes: 0.02 10*3/uL (ref 0.00–0.07)
Basophils Absolute: 0.1 10*3/uL (ref 0.0–0.1)
Basophils Relative: 2 %
Eosinophils Absolute: 0 10*3/uL (ref 0.0–0.5)
Eosinophils Relative: 0 %
HCT: 42.2 % (ref 39.0–52.0)
Hemoglobin: 13.7 g/dL (ref 13.0–17.0)
Immature Granulocytes: 0 %
Lymphocytes Relative: 50 %
Lymphs Abs: 3.3 10*3/uL (ref 0.7–4.0)
MCH: 26.4 pg (ref 26.0–34.0)
MCHC: 32.5 g/dL (ref 30.0–36.0)
MCV: 81.3 fL (ref 80.0–100.0)
Monocytes Absolute: 0.8 10*3/uL (ref 0.1–1.0)
Monocytes Relative: 13 %
Neutro Abs: 2.3 10*3/uL (ref 1.7–7.7)
Neutrophils Relative %: 35 %
Platelet Count: 145 10*3/uL — ABNORMAL LOW (ref 150–400)
RBC: 5.19 MIL/uL (ref 4.22–5.81)
RDW: 12.9 % (ref 11.5–15.5)
WBC Count: 6.7 10*3/uL (ref 4.0–10.5)
nRBC: 0 % (ref 0.0–0.2)

## 2020-01-09 LAB — CMP (CANCER CENTER ONLY)
ALT: 18 U/L (ref 0–44)
AST: 18 U/L (ref 15–41)
Albumin: 4.4 g/dL (ref 3.5–5.0)
Alkaline Phosphatase: 46 U/L (ref 38–126)
Anion gap: 4 — ABNORMAL LOW (ref 5–15)
BUN: 13 mg/dL (ref 6–20)
CO2: 28 mmol/L (ref 22–32)
Calcium: 9.3 mg/dL (ref 8.9–10.3)
Chloride: 111 mmol/L (ref 98–111)
Creatinine: 1.13 mg/dL (ref 0.61–1.24)
GFR, Est AFR Am: >60 mL/min (ref >60)
GFR, Estimated: >60 mL/min (ref >60)
Glucose, Bld: 69 mg/dL — ABNORMAL LOW (ref 70–99)
Potassium: 4 mmol/L (ref 3.5–5.1)
Sodium: 143 mmol/L (ref 135–145)
Total Bilirubin: 0.3 mg/dL (ref 0.3–1.2)
Total Protein: 6.6 g/dL (ref 6.5–8.1)

## 2020-01-09 LAB — SAVE SMEAR(SSMR), FOR PROVIDER SLIDE REVIEW

## 2020-01-09 NOTE — Progress Notes (Signed)
Hematology and Oncology Follow Up Visit  David Whitehead [id number] January 11, 1971 49 y.o. 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 daily  Venetoclax 200 mg po q day -- start on 12/06/2019 - on hold starting on 01/02/2020     Interim History:  David Whitehead is back for follow-up.  These vesicular lesions that he had appear to be getting better.  He is on acyclovir 800 mg p.o. 5 times a day.  They appear to be drying up a little bit.  They do not hurt him.  They do not cause any burning.  He stopped the venetoclax a week ago.  His white cell count is about the same.  His lymphocyte still keep coming down which is nice to see.  His neutrophils keep going up.  He has had no fever.  He has had no bleeding.  He has had no change in bowel or bladder habits.  He has had no leg swelling.  He has had no headaches.  He has had no problems with Diflucan.  He had the cryptococcal meningitis.  Overall, I would say his performance status is ECOG 0.    Medications:  Current Outpatient Medications:  .  acetaminophen (TYLENOL) 500 MG tablet, Take 500 mg by mouth every 6 (six) hours as needed for mild pain., Disp: , Rfl:  .  acyclovir (ZOVIRAX) 800 MG tablet, Take 1 tablet (800 mg total) by mouth 5 (five) times daily., Disp: 70 tablet, Rfl: 2 .  cetirizine (ZYRTEC) 10 MG tablet, Take 10 mg by mouth daily., Disp: , Rfl:  .  fluconazole (DIFLUCAN) 200 MG tablet, Take 1 tablet (200 mg total) by mouth daily., Disp: 30 tablet, Rfl: 10 .  methylPREDNISolone (MEDROL DOSEPAK) 4 MG TBPK tablet, Take as directed : 6-5-4-3-2-1, Disp: 21 tablet, Rfl: 1 .  Prenatal Vit-Fe Fumarate-FA (PRENATAL MULTIVITAMIN) TABS tablet, Take 1 tablet by mouth daily at 12 noon., Disp: , Rfl:  .  VENCLEXTA STARTING PACK 10 & 50 & 100 MG TBPK, TAKE 20 MG BY MOUTH DAILY FOR 7 DAYS, THEN 50 MG DAILY FOR 7 DAYS, THEN 100 MG  DAILY FOR 7 DAYS, THEN 200 MG DAILY FOR 7 DAYS., Disp: 42 tablet, Rfl: 0 .  venetoclax 100 MG TABS, Take 200 mg by mouth daily., Disp: 60 tablet, Rfl: 6  Allergies:  Allergies  Allergen Reactions  . Bactrim [Sulfamethoxazole-Trimethoprim] Rash  . Latex Other (See Comments)    Took skin off when removed tape  . Allopurinol Itching and Rash  . Amoxicillin Rash    Has patient had a PCN reaction causing immediate rash, facial/tongue/throat swelling, SOB or lightheadedness with hypotension: No Has patient had a PCN reaction causing severe rash involving mucus membranes or skin necrosis: No Has patient had a PCN reaction that required hospitalization: No Has patient had a PCN reaction occurring within the last 10 years: No If all of the above answers are "NO", then may proceed with Cephalosporin use.     Past Medical History, Surgical history, Social history, and Family History were reviewed and updated.  Review of Systems: Review of Systems  Constitutional: Negative.   HENT:  Negative.   Eyes: Negative.   Respiratory: Negative.   Cardiovascular: Negative.   Gastrointestinal: Negative.   Endocrine: Negative.   Genitourinary: Negative.    Musculoskeletal: Negative.   Skin: Negative.   Neurological: Negative.   Hematological:  Negative.   Psychiatric/Behavioral: Negative.     Physical Exam:  weight is 234 lb (106.1 kg). His oral temperature is 98.3 F (36.8 C). His blood pressure is 144/94 (abnormal) and his pulse is 88. His respiration is 16 and oxygen saturation is 100%.   Wt Readings from Last 3 Encounters:  01/09/20 234 lb (106.1 kg)  01/02/20 232 lb (105.2 kg)  11/29/19 238 lb (108 kg)    Physical Exam Vitals reviewed.  HENT:     Head: Normocephalic and atraumatic.  Eyes:     Pupils: Pupils are equal, round, and reactive to light.  Neck:     Comments: On his neck exam, he does have some adenopathy bilaterally in the posterior cervical chain.  There is also some  adenopathy in the supraclavicular chain.  These are mobile and nontender.  They probably measure about 5-10 mm in size. Cardiovascular:     Rate and Rhythm: Normal rate and regular rhythm.     Heart sounds: Normal heart sounds.  Pulmonary:     Effort: Pulmonary effort is normal.     Breath sounds: Normal breath sounds.  Abdominal:     General: Bowel sounds are normal.     Palpations: Abdomen is soft.  Musculoskeletal:        General: No tenderness or deformity. Normal range of motion.     Cervical back: Normal range of motion.     Comments: On the dorsum of his right hand, he has 5 papular lesions.  They are nontender.  They are reddish in nature.  They probably measure about 5 mm in size.  Lymphadenopathy:     Cervical: No cervical adenopathy.  Skin:    General: Skin is warm and dry.     Findings: No erythema or rash.  Neurological:     Mental Status: He is alert and oriented to person, place, and time.  Psychiatric:        Behavior: Behavior normal.        Thought Content: Thought content normal.        Judgment: Judgment normal.      Lab Results  Component Value Date   WBC 6.7 01/09/2020   HGB 13.7 01/09/2020   HCT 42.2 01/09/2020   MCV 81.3 01/09/2020   PLT 145 (L) 01/09/2020     Chemistry      Component Value Date/Time   NA 143 01/09/2020 1426   NA 140 03/08/2019 1438   K 4.0 01/09/2020 1426   CL 111 01/09/2020 1426   CO2 28 01/09/2020 1426   BUN 13 01/09/2020 1426   BUN 25 (H) 03/08/2019 1438   CREATININE 1.13 01/09/2020 1426   CREATININE 1.15 10/26/2019 0920      Component Value Date/Time   CALCIUM 9.3 01/09/2020 1426   ALKPHOS 46 01/09/2020 1426   AST 18 01/09/2020 1426   ALT 18 01/09/2020 1426   BILITOT 0.3 01/09/2020 1426      Impression and Plan:  David Whitehead is a very nice 49 year old white male.  He has CLL.  I must say that he certainly is quite young for CLL.  He clearly has an aggressive CLL by virtue of the cytogenetics.  It is hard to  say if he truly has zoster.  I did some viral cultures and these all came back negative.  I would keep him on the acyclovir at 800 mg p.o. 5 times a day for the next 3 days.  I would then have him go  down to 800 mg p.o. 3 times daily.  He will stay off the venetoclax until I see him back.  I do not think this is going to be a problem since his white cell count is stable.  We will get him back on venetoclax, we will have him only take 100 mg daily.  I will like to see him back in another couple weeks and then hopefully we will get back on track.    Volanda Napoleon, MD 8/30/20213:30 PM

## 2020-01-10 ENCOUNTER — Other Ambulatory Visit (HOSPITAL_COMMUNITY): Payer: BC Managed Care – PPO

## 2020-01-10 LAB — LACTATE DEHYDROGENASE: LDH: 193 U/L — ABNORMAL HIGH (ref 98–192)

## 2020-01-10 NOTE — Progress Notes (Signed)
Oncology Nurse Navigator Documentation  Oncology Nurse Navigator Flowsheets 01/09/2020  Abnormal Finding Date -  Confirmed Diagnosis Date -  Diagnosis Status -  Phase of Treatment -  Chemotherapy Actual Start Date: -  Expected Surgery Date -  Navigator Follow Up Date: 01/24/2020  Navigator Follow Up Reason: Follow-up Appointment  Navigator Location [facility]  Referral Date to RadOnc/MedOnc -  Navigator Encounter Type Follow-up Appt  Telephone -  Treatment Initiated Date -  Patient Visit Type MedOnc  Treatment Phase Active Tx  Barriers/Navigation Needs Coordination of Care;Education  Education -  Interventions Psycho-Social Support  Acuity Level 2-Minimal Needs (1-2 Barriers Identified)  Referrals -  Coordination of Care -  Education Method -  Support Groups/Services Friends and Family  Time Spent with Patient 30

## 2020-01-24 ENCOUNTER — Other Ambulatory Visit: Payer: Self-pay

## 2020-01-24 ENCOUNTER — Inpatient Hospital Stay: Payer: BC Managed Care – PPO | Attending: Hematology | Admitting: Hematology & Oncology

## 2020-01-24 ENCOUNTER — Inpatient Hospital Stay: Payer: BC Managed Care – PPO

## 2020-01-24 VITALS — BP 140/98 | HR 86 | Temp 99.0°F | Wt 233.8 lb

## 2020-01-24 DIAGNOSIS — C9110 Chronic lymphocytic leukemia of B-cell type not having achieved remission: Secondary | ICD-10-CM

## 2020-01-24 DIAGNOSIS — Z7952 Long term (current) use of systemic steroids: Secondary | ICD-10-CM | POA: Diagnosis not present

## 2020-01-24 DIAGNOSIS — L989 Disorder of the skin and subcutaneous tissue, unspecified: Secondary | ICD-10-CM | POA: Insufficient documentation

## 2020-01-24 DIAGNOSIS — B451 Cerebral cryptococcosis: Secondary | ICD-10-CM | POA: Diagnosis not present

## 2020-01-24 DIAGNOSIS — Z79899 Other long term (current) drug therapy: Secondary | ICD-10-CM | POA: Insufficient documentation

## 2020-01-24 LAB — CMP (CANCER CENTER ONLY)
ALT: 19 U/L (ref 0–44)
AST: 17 U/L (ref 15–41)
Albumin: 4.3 g/dL (ref 3.5–5.0)
Alkaline Phosphatase: 51 U/L (ref 38–126)
Anion gap: 7 (ref 5–15)
BUN: 17 mg/dL (ref 6–20)
CO2: 28 mmol/L (ref 22–32)
Calcium: 9.3 mg/dL (ref 8.9–10.3)
Chloride: 108 mmol/L (ref 98–111)
Creatinine: 1.19 mg/dL (ref 0.61–1.24)
GFR, Est AFR Am: >60 mL/min (ref >60)
GFR, Estimated: >60 mL/min (ref >60)
Glucose, Bld: 72 mg/dL (ref 70–99)
Potassium: 4 mmol/L (ref 3.5–5.1)
Sodium: 143 mmol/L (ref 135–145)
Total Bilirubin: 0.3 mg/dL (ref 0.3–1.2)
Total Protein: 6.4 g/dL — ABNORMAL LOW (ref 6.5–8.1)

## 2020-01-24 LAB — CBC WITH DIFFERENTIAL (CANCER CENTER ONLY)
Abs Immature Granulocytes: 0.03 10*3/uL (ref 0.00–0.07)
Basophils Absolute: 0.1 10*3/uL (ref 0.0–0.1)
Basophils Relative: 1 %
Eosinophils Absolute: 0.7 10*3/uL — ABNORMAL HIGH (ref 0.0–0.5)
Eosinophils Relative: 6 %
HCT: 42.4 % (ref 39.0–52.0)
Hemoglobin: 13.8 g/dL (ref 13.0–17.0)
Immature Granulocytes: 0 %
Lymphocytes Relative: 33 %
Lymphs Abs: 3.8 10*3/uL (ref 0.7–4.0)
MCH: 26.4 pg (ref 26.0–34.0)
MCHC: 32.5 g/dL (ref 30.0–36.0)
MCV: 81.2 fL (ref 80.0–100.0)
Monocytes Absolute: 2 10*3/uL — ABNORMAL HIGH (ref 0.1–1.0)
Monocytes Relative: 17 %
Neutro Abs: 5 10*3/uL (ref 1.7–7.7)
Neutrophils Relative %: 43 %
Platelet Count: 145 10*3/uL — ABNORMAL LOW (ref 150–400)
RBC: 5.22 MIL/uL (ref 4.22–5.81)
RDW: 13.2 % (ref 11.5–15.5)
WBC Count: 11.8 10*3/uL — ABNORMAL HIGH (ref 4.0–10.5)
nRBC: 0 % (ref 0.0–0.2)

## 2020-01-24 LAB — SAVE SMEAR(SSMR), FOR PROVIDER SLIDE REVIEW

## 2020-01-24 LAB — URIC ACID: Uric Acid, Serum: 6.1 mg/dL (ref 3.7–8.6)

## 2020-01-24 NOTE — Progress Notes (Signed)
Hematology and Oncology Follow Up Visit  David Whitehead [id number] 29-Sep-1970 48 y.o. 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 daily       Venetoclax 100 mg po q day -- start on 01/24/2020 -     Interim History:  David Whitehead is back for follow-up.  He is doing a lot better.  The vesicular lesions that he had are drying up.  I think this certainly could be shingles.  He is now on acyclovir 800 mg p.o. 3 times daily.  I will decrease the dose to 800 mg p.o. twice daily.  We will now restart the venetoclax.  Because he is on Diflucan, we can probably start him off at 100 mg a day.  I think this would be reasonable.  His lymphocytes have come down quite nicely.  There is been no problems with fevers.  He has had no bleeding.  He has had no nausea or vomiting.  There is been no diarrhea.  He has been active.  Had a very nice weekend.  He thinks he goes back to see the infectious disease doctors for the cryptococcal meningitis in October.  Overall, I would say his performance status is ECOG 0.    Medications:  Current Outpatient Medications:    acetaminophen (TYLENOL) 500 MG tablet, Take 500 mg by mouth every 6 (six) hours as needed for mild pain., Disp: , Rfl:    acyclovir (ZOVIRAX) 800 MG tablet, Take 1 tablet (800 mg total) by mouth 5 (five) times daily., Disp: 70 tablet, Rfl: 2   cetirizine (ZYRTEC) 10 MG tablet, Take 10 mg by mouth daily., Disp: , Rfl:    fluconazole (DIFLUCAN) 200 MG tablet, Take 1 tablet (200 mg total) by mouth daily., Disp: 30 tablet, Rfl: 10   methylPREDNISolone (MEDROL DOSEPAK) 4 MG TBPK tablet, Take as directed : 6-5-4-3-2-1, Disp: 21 tablet, Rfl: 1   Prenatal Vit-Fe Fumarate-FA (PRENATAL MULTIVITAMIN) TABS tablet, Take 1 tablet by mouth daily at 12 noon., Disp: , Rfl:    VENCLEXTA STARTING PACK 10 & 50 & 100 MG TBPK, TAKE 20 MG BY MOUTH  DAILY FOR 7 DAYS, THEN 50 MG DAILY FOR 7 DAYS, THEN 100 MG DAILY FOR 7 DAYS, THEN 200 MG DAILY FOR 7 DAYS., Disp: 42 tablet, Rfl: 0   venetoclax 100 MG TABS, Take 200 mg by mouth daily., Disp: 60 tablet, Rfl: 6  Allergies:  Allergies  Allergen Reactions   Bactrim [Sulfamethoxazole-Trimethoprim] Rash   Latex Other (See Comments)    Took skin off when removed tape   Allopurinol Itching and Rash   Amoxicillin Rash    Has patient had a PCN reaction causing immediate rash, facial/tongue/throat swelling, SOB or lightheadedness with hypotension: No Has patient had a PCN reaction causing severe rash involving mucus membranes or skin necrosis: No Has patient had a PCN reaction that required hospitalization: No Has patient had a PCN reaction occurring within the last 10 years: No If all of the above answers are "NO", then may proceed with Cephalosporin use.     Past Medical History, Surgical history, Social history, and Family History were reviewed and updated.  Review of Systems: Review of Systems  Constitutional: Negative.   HENT:  Negative.   Eyes: Negative.   Respiratory: Negative.   Cardiovascular: Negative.   Gastrointestinal: Negative.   Endocrine: Negative.   Genitourinary: Negative.  Musculoskeletal: Negative.   Skin: Negative.   Neurological: Negative.   Hematological: Negative.   Psychiatric/Behavioral: Negative.     Physical Exam:  weight is 233 lb 12.8 oz (106.1 kg). His temperature is 99 F (37.2 C). His blood pressure is 140/98 (abnormal) and his pulse is 86.   Wt Readings from Last 3 Encounters:  01/24/20 233 lb 12.8 oz (106.1 kg)  01/09/20 234 lb (106.1 kg)  01/02/20 232 lb (105.2 kg)    Physical Exam Vitals reviewed.  HENT:     Head: Normocephalic and atraumatic.  Eyes:     Pupils: Pupils are equal, round, and reactive to light.  Neck:     Comments: On his neck exam, I really cannot palpate any adenopathy at this point.  There is no cervical or  supraclavicular lymph nodes.  Cardiovascular:     Rate and Rhythm: Normal rate and regular rhythm.     Heart sounds: Normal heart sounds.  Pulmonary:     Effort: Pulmonary effort is normal.     Breath sounds: Normal breath sounds.  Abdominal:     General: Bowel sounds are normal.     Palpations: Abdomen is soft.  Musculoskeletal:        General: No tenderness or deformity. Normal range of motion.     Cervical back: Normal range of motion.  Lymphadenopathy:     Cervical: No cervical adenopathy.  Skin:    General: Skin is warm and dry.     Findings: No erythema or rash.  Neurological:     Mental Status: He is alert and oriented to person, place, and time.  Psychiatric:        Behavior: Behavior normal.        Thought Content: Thought content normal.        Judgment: Judgment normal.      Lab Results  Component Value Date   WBC 11.8 (H) 01/24/2020   HGB 13.8 01/24/2020   HCT 42.4 01/24/2020   MCV 81.2 01/24/2020   PLT 145 (L) 01/24/2020     Chemistry      Component Value Date/Time   NA 143 01/24/2020 1502   NA 140 03/08/2019 1438   K 4.0 01/24/2020 1502   CL 108 01/24/2020 1502   CO2 28 01/24/2020 1502   BUN 17 01/24/2020 1502   BUN 25 (H) 03/08/2019 1438   CREATININE 1.19 01/24/2020 1502   CREATININE 1.15 10/26/2019 0920      Component Value Date/Time   CALCIUM 9.3 01/24/2020 1502   ALKPHOS 51 01/24/2020 1502   AST 17 01/24/2020 1502   ALT 19 01/24/2020 1502   BILITOT 0.3 01/24/2020 1502      Impression and Plan:  David Whitehead is a very nice 49 year old white male.  He has CLL.  I must say that he certainly is quite young for CLL.  He clearly has an aggressive CLL by virtue of the cytogenetics.  I will go ahead and restart his venetoclax.  We will start him off at 100 mg a day.  We will see how he does with this.  Again, I think with him being on Diflucan, he probably does have a higher active level of the venetoclax.  I forgot to mention that his uric  acid is only 6.1.  As such, I doubt any problems with tumor lysis will occur.  We will now plan to get him back in about a month or so.  Hopefully, there will not be any issues  between visits.      Volanda Napoleon, MD 9/14/20214:27 PM

## 2020-01-25 ENCOUNTER — Encounter: Payer: Self-pay | Admitting: *Deleted

## 2020-01-25 LAB — LACTATE DEHYDROGENASE: LDH: 209 U/L — ABNORMAL HIGH (ref 98–192)

## 2020-01-25 NOTE — Progress Notes (Signed)
Oncology Nurse Navigator Documentation  Oncology Nurse Navigator Flowsheets 01/25/2020  Abnormal Finding Date -  Confirmed Diagnosis Date -  Diagnosis Status -  Phase of Treatment -  Chemotherapy Actual Start Date: -  Expected Surgery Date -  Navigator Follow Up Date: 02/23/2020  Navigator Follow Up Reason: Follow-up Appointment  Navigator Location [facility]  Referral Date to RadOnc/MedOnc -  Navigator Encounter Type Appt/Treatment Plan Review  Telephone -  Treatment Initiated Date -  Patient Visit Type MedOnc  Treatment Phase Active Tx  Barriers/Navigation Needs Coordination of Care;Education  Education -  Interventions None Required  Acuity Level 2-Minimal Needs (1-2 Barriers Identified)  Referrals -  Coordination of Care -  Education Method -  Support Groups/Services Friends and Family  Time Spent with Patient 15

## 2020-02-23 ENCOUNTER — Encounter: Payer: Self-pay | Admitting: Hematology & Oncology

## 2020-02-23 ENCOUNTER — Other Ambulatory Visit: Payer: Self-pay

## 2020-02-23 ENCOUNTER — Inpatient Hospital Stay: Payer: BC Managed Care – PPO | Attending: Hematology

## 2020-02-23 ENCOUNTER — Inpatient Hospital Stay (HOSPITAL_BASED_OUTPATIENT_CLINIC_OR_DEPARTMENT_OTHER): Payer: BC Managed Care – PPO | Admitting: Hematology & Oncology

## 2020-02-23 ENCOUNTER — Encounter: Payer: Self-pay | Admitting: *Deleted

## 2020-02-23 ENCOUNTER — Inpatient Hospital Stay: Payer: BC Managed Care – PPO

## 2020-02-23 VITALS — BP 124/95 | HR 70 | Temp 98.6°F | Resp 18 | Ht 70.0 in | Wt 230.8 lb

## 2020-02-23 DIAGNOSIS — Z79899 Other long term (current) drug therapy: Secondary | ICD-10-CM | POA: Diagnosis not present

## 2020-02-23 DIAGNOSIS — C9110 Chronic lymphocytic leukemia of B-cell type not having achieved remission: Secondary | ICD-10-CM | POA: Diagnosis not present

## 2020-02-23 DIAGNOSIS — Z23 Encounter for immunization: Secondary | ICD-10-CM | POA: Diagnosis not present

## 2020-02-23 DIAGNOSIS — B0229 Other postherpetic nervous system involvement: Secondary | ICD-10-CM | POA: Insufficient documentation

## 2020-02-23 DIAGNOSIS — B451 Cerebral cryptococcosis: Secondary | ICD-10-CM | POA: Insufficient documentation

## 2020-02-23 LAB — CMP (CANCER CENTER ONLY)
ALT: 20 U/L (ref 0–44)
AST: 17 U/L (ref 15–41)
Albumin: 4.5 g/dL (ref 3.5–5.0)
Alkaline Phosphatase: 47 U/L (ref 38–126)
Anion gap: 6 (ref 5–15)
BUN: 17 mg/dL (ref 6–20)
CO2: 30 mmol/L (ref 22–32)
Calcium: 9.8 mg/dL (ref 8.9–10.3)
Chloride: 106 mmol/L (ref 98–111)
Creatinine: 1.15 mg/dL (ref 0.61–1.24)
GFR, Estimated: >60 mL/min (ref >60)
Glucose, Bld: 82 mg/dL (ref 70–99)
Potassium: 4.3 mmol/L (ref 3.5–5.1)
Sodium: 142 mmol/L (ref 135–145)
Total Bilirubin: 0.4 mg/dL (ref 0.3–1.2)
Total Protein: 6.3 g/dL — ABNORMAL LOW (ref 6.5–8.1)

## 2020-02-23 LAB — CBC WITH DIFFERENTIAL (CANCER CENTER ONLY)
Abs Immature Granulocytes: 0.04 10*3/uL (ref 0.00–0.07)
Basophils Absolute: 0.1 10*3/uL (ref 0.0–0.1)
Basophils Relative: 1 %
Eosinophils Absolute: 0.6 10*3/uL — ABNORMAL HIGH (ref 0.0–0.5)
Eosinophils Relative: 9 %
HCT: 44.3 % (ref 39.0–52.0)
Hemoglobin: 14.1 g/dL (ref 13.0–17.0)
Immature Granulocytes: 1 %
Lymphocytes Relative: 26 %
Lymphs Abs: 1.7 10*3/uL (ref 0.7–4.0)
MCH: 25.9 pg — ABNORMAL LOW (ref 26.0–34.0)
MCHC: 31.8 g/dL (ref 30.0–36.0)
MCV: 81.4 fL (ref 80.0–100.0)
Monocytes Absolute: 0.7 10*3/uL (ref 0.1–1.0)
Monocytes Relative: 10 %
Neutro Abs: 3.6 10*3/uL (ref 1.7–7.7)
Neutrophils Relative %: 53 %
Platelet Count: 176 10*3/uL (ref 150–400)
RBC: 5.44 MIL/uL (ref 4.22–5.81)
RDW: 13.2 % (ref 11.5–15.5)
WBC Count: 6.7 10*3/uL (ref 4.0–10.5)
nRBC: 0 % (ref 0.0–0.2)

## 2020-02-23 LAB — LACTATE DEHYDROGENASE: LDH: 151 U/L (ref 98–192)

## 2020-02-23 LAB — SAVE SMEAR(SSMR), FOR PROVIDER SLIDE REVIEW

## 2020-02-23 MED ORDER — INFLUENZA VAC SPLIT QUAD 0.5 ML IM SUSY
0.5000 mL | PREFILLED_SYRINGE | Freq: Once | INTRAMUSCULAR | Status: AC
  Administered 2020-02-23: 0.5 mL via INTRAMUSCULAR

## 2020-02-23 MED ORDER — INFLUENZA VAC SPLIT QUAD 0.5 ML IM SUSY
PREFILLED_SYRINGE | INTRAMUSCULAR | Status: AC
  Filled 2020-02-23: qty 0.5

## 2020-02-23 NOTE — Progress Notes (Signed)
Spoke to patient before his MD appointment. He is doing well, but still c/o pain to the areas which were affected by his shingles rash. Discussed with him Neurontin and/or lidocaine patches. He will talk to Dr Marin Olp about what might be an option for him. He also mentioned that he had met with a lawyer for initiation of disability claim. He had some concern about his insurance changing over to medicaid, and then losing the medication assistance he has been receiving. Reviewed some alternative for financial help such as the [employer], and instructed him to let us know when any potential changes would be coming so we could help with the financial strain.   Oncology Nurse Navigator Documentation  Oncology Nurse Navigator Flowsheets 02/23/2020  Abnormal Finding Date -  Confirmed Diagnosis Date -  Diagnosis Status -  Phase of Treatment -  Chemotherapy Actual Start Date: -  Expected Surgery Date -  Navigator Follow Up Date: 04/12/2020  Navigator Follow Up Reason: Follow-up Appointment  Navigator Location [facility]  Referral Date to RadOnc/MedOnc -  Navigator Encounter Type Follow-up Appt  Telephone -  Treatment Initiated Date -  Patient Visit Type MedOnc  Treatment Phase Active Tx  Barriers/Navigation Needs Coordination of Care;Education  Education Pain/ Symptom Management  Interventions Education;Psycho-Social Support  Acuity Level 2-Minimal Needs (1-2 Barriers Identified)  Referrals -  Coordination of Care -  Education Method Verbal  Support Groups/Services Friends and Family  Time Spent with Patient 30

## 2020-02-23 NOTE — Progress Notes (Signed)
Hematology and Oncology Follow Up Visit  David Whitehead [id number] 04-Dec-1970 49 y.o. 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 daily       Venetoclax 100 mg po q day -- start on 01/24/2020 -     Interim History:  David Whitehead is back for follow-up.  So far, everything is going quite well.  He has responded nicely to the venetoclax.  His white cell count is now 6.7 with 26% lymphocytes.  He has had no problems with fever.  He still has some postherpetic neuralgia over on the left side.  This is like in the left T5 dermatome.  He has had a little bit of abdominal discomfort.  He took a laxative and this seemed to improve.  He has had no bleeding.  He has had no fever.  He is still on the Diflucan for his cryptococcal meningitis.  He has been on this for a year.  He sees the infectious disease specialist in a couple weeks.  He has had a good appetite.  There is been no nausea or vomiting.  Overall, I would say his performance status is ECOG 0.    Medications:  Current Outpatient Medications:  .  acyclovir (ZOVIRAX) 800 MG tablet, Take 1 tablet (800 mg total) by mouth 5 (five) times daily., Disp: 70 tablet, Rfl: 2 .  cetirizine (ZYRTEC) 10 MG tablet, Take 10 mg by mouth daily., Disp: , Rfl:  .  fluconazole (DIFLUCAN) 200 MG tablet, Take 1 tablet (200 mg total) by mouth daily., Disp: 30 tablet, Rfl: 10 .  Prenatal Vit-Fe Fumarate-FA (PRENATAL MULTIVITAMIN) TABS tablet, Take 1 tablet by mouth daily at 12 noon., Disp: , Rfl:  .  venetoclax 100 MG TABS, Take 200 mg by mouth daily., Disp: 60 tablet, Rfl: 6  Allergies:  Allergies  Allergen Reactions  . Bactrim [Sulfamethoxazole-Trimethoprim] Rash  . Latex Other (See Comments)    Took skin off when removed tape  . Allopurinol Itching and Rash  . Amoxicillin Rash    Has patient had a PCN reaction causing immediate  rash, facial/tongue/throat swelling, SOB or lightheadedness with hypotension: No Has patient had a PCN reaction causing severe rash involving mucus membranes or skin necrosis: No Has patient had a PCN reaction that required hospitalization: No Has patient had a PCN reaction occurring within the last 10 years: No If all of the above answers are "NO", then may proceed with Cephalosporin use.     Past Medical History, Surgical history, Social history, and Family History were reviewed and updated.  Review of Systems: Review of Systems  Constitutional: Negative.   HENT:  Negative.   Eyes: Negative.   Respiratory: Negative.   Cardiovascular: Negative.   Gastrointestinal: Negative.   Endocrine: Negative.   Genitourinary: Negative.    Musculoskeletal: Negative.   Skin: Negative.   Neurological: Negative.   Hematological: Negative.   Psychiatric/Behavioral: Negative.     Physical Exam:  height is 5\' 10"  (1.778 m) and weight is 230 lb 12.8 oz (104.7 kg). His oral temperature is 98.6 F (37 C). His blood pressure is 124/95 (abnormal) and his pulse is 70. His respiration is 18 and oxygen saturation is 100%.   Wt Readings from Last 3 Encounters:  02/23/20 230 lb 12.8 oz (104.7 kg)  01/24/20 233 lb 12.8 oz (106.1 kg)  01/09/20 234 lb (106.1 kg)    Physical  Exam Vitals reviewed.  HENT:     Head: Normocephalic and atraumatic.  Eyes:     Pupils: Pupils are equal, round, and reactive to light.  Neck:     Comments: On his neck exam, I really cannot palpate any adenopathy at this point.  There is no cervical or supraclavicular lymph nodes.  Cardiovascular:     Rate and Rhythm: Normal rate and regular rhythm.     Heart sounds: Normal heart sounds.  Pulmonary:     Effort: Pulmonary effort is normal.     Breath sounds: Normal breath sounds.  Abdominal:     General: Bowel sounds are normal.     Palpations: Abdomen is soft.  Musculoskeletal:        General: No tenderness or deformity.  Normal range of motion.     Cervical back: Normal range of motion.  Lymphadenopathy:     Cervical: No cervical adenopathy.  Skin:    General: Skin is warm and dry.     Findings: No erythema or rash.  Neurological:     Mental Status: He is alert and oriented to person, place, and time.  Psychiatric:        Behavior: Behavior normal.        Thought Content: Thought content normal.        Judgment: Judgment normal.      Lab Results  Component Value Date   WBC 6.7 02/23/2020   HGB 14.1 02/23/2020   HCT 44.3 02/23/2020   MCV 81.4 02/23/2020   PLT 176 02/23/2020     Chemistry      Component Value Date/Time   NA 142 02/23/2020 1119   NA 140 03/08/2019 1438   K 4.3 02/23/2020 1119   CL 106 02/23/2020 1119   CO2 30 02/23/2020 1119   BUN 17 02/23/2020 1119   BUN 25 (H) 03/08/2019 1438   CREATININE 1.15 02/23/2020 1119   CREATININE 1.15 10/26/2019 0920      Component Value Date/Time   CALCIUM 9.8 02/23/2020 1119   ALKPHOS 47 02/23/2020 1119   AST 17 02/23/2020 1119   ALT 20 02/23/2020 1119   BILITOT 0.4 02/23/2020 1119      Impression and Plan:  David Whitehead is a very nice 49 year old white male.  He has CLL.  I must say that he certainly is quite young for CLL.  He clearly has an aggressive CLL by virtue of the cytogenetics.  We will continue on the low-dose venetoclax.  If the Diflucan is discontinued, we may consider increasing the any issues between visits.      Volanda Napoleon, MD 10/14/202112:53 PM

## 2020-02-24 LAB — IGG, IGA, IGM
IgA: 9 mg/dL — ABNORMAL LOW (ref 90–386)
IgG (Immunoglobin G), Serum: 608 mg/dL (ref 603–1613)
IgM (Immunoglobulin M), Srm: <5 mg/dL — ABNORMAL LOW (ref 20–172)

## 2020-03-07 ENCOUNTER — Ambulatory Visit: Payer: BC Managed Care – PPO | Admitting: Infectious Disease

## 2020-03-12 ENCOUNTER — Ambulatory Visit (INDEPENDENT_AMBULATORY_CARE_PROVIDER_SITE_OTHER): Payer: BC Managed Care – PPO | Admitting: Infectious Disease

## 2020-03-12 ENCOUNTER — Other Ambulatory Visit: Payer: Self-pay

## 2020-03-12 ENCOUNTER — Encounter: Payer: Self-pay | Admitting: Infectious Disease

## 2020-03-12 VITALS — BP 128/88 | HR 72 | Wt 239.0 lb

## 2020-03-12 DIAGNOSIS — B451 Cerebral cryptococcosis: Secondary | ICD-10-CM

## 2020-03-12 DIAGNOSIS — C9110 Chronic lymphocytic leukemia of B-cell type not having achieved remission: Secondary | ICD-10-CM

## 2020-03-12 DIAGNOSIS — B029 Zoster without complications: Secondary | ICD-10-CM

## 2020-03-12 MED ORDER — FLUCONAZOLE 200 MG PO TABS
200.0000 mg | ORAL_TABLET | Freq: Every day | ORAL | 10 refills | Status: DC
Start: 2020-03-12 — End: 2021-02-26

## 2020-03-12 NOTE — Progress Notes (Signed)
Subjective:   Complaint: Follow-up for cryptococcal meningitis on medications   Patient ID: David Whitehead, male    DOB: 1971-04-09, 49 y.o.   MRN: [id number]  HPI   David Whitehead is a 49 year old Caucasian male who was on Imbruvica for CLL and then developed severe cryptococcal meningitis.  He underwent VP shunt placement by Dr. Herold Harms while in the hospital for control of his high intracranial pressures.  He completed nearly a month of amphotericin with flucytosine.  Towards the end of his month of therapy he did develop a drug rash and acute kidney injury both attributed to the amphotericin.  He was given prednisone for 3 days and still had the rash at discharge.  He had follow-up labs as an outpatient with his primary care physician which showed his creatinine was still around the same range as it was when he was discharged from the hospital.  He thought the rash was getting worse but actually since he was seen by the PCP it is actually been improving.  Per Dr. [name] the rash was largely on his flank and torso but extended to his extremities when it was at its worse.  The patient confirmed this and showed me that the rash had disappeared from his lower extremities and his back.  It also dissappeared from his torso.  T  I last saw him in clinic in early November 2020 and he was having result resolution of this rash.  He is currently on venotoclax with Dr. Marin Olp for his CLL.  He has no headaches or symptoms that would be concerning for recurrence of his cryptococcal meningitis.  He did have a recent outbreak of zoster and still has remnant scars and pain.    Headaches or other systemic symptoms to suggest recurrence of infection Past Medical History:  Diagnosis Date  . Cancer ([place])   . Chronic headache   . CLL (chronic lymphocytic leukemia) ([place])   . Diplopia   . Edema 03/14/2019  . Hypertension   . Leukocytosis 04/01/2018  . Lower extremity edema 03/14/2019  .  Molluscum contagiosum 10/26/2019  . Sleep apnea    uses a cpap    Past Surgical History:  Procedure Laterality Date  . AXILLARY LYMPH NODE DISSECTION Right 04/16/2018   Procedure: AXILLARY LYMPH NODE DEEP EXCISION;  Surgeon: Fanny Skates, MD;  Location: [facility];  Service: General;  Laterality: Right;  . COLONOSCOPY N/A 04/01/2019   Procedure: COLONOSCOPY;  Surgeon: Danie Binder, MD;  [facility];  Service: Endoscopy;  Laterality: N/A;  12:45pm  . ESOPHAGOGASTRODUODENOSCOPY N/A 04/01/2019   Procedure: ESOPHAGOGASTRODUODENOSCOPY (EGD);  Surgeon: Danie Binder, MD;  Location[facility];  Service: Endoscopy;  Laterality: N/A;  . FINGER SURGERY    . VENTRICULOPERITONEAL SHUNT Right 02/18/2019   Procedure: SHUNT INSERTION VENTRICULAR-PERITONEAL;  Surgeon: Ashok Pall, MD;  Location: [facility];  Service: Neurosurgery;  Laterality: Right;  SHUNT INSERTION VENTRICULAR-PERITONEAL    Family History  Problem Relation Age of Onset  . Colon cancer Father 52       passed from [place]  . Colon cancer Paternal Uncle 66       passed from [facility]      Social History   Socioeconomic History  . Marital status: Married    Spouse name: [name]  . Number of children: 0  . Years of education: 13  . Highest education level: Not on file  Occupational History  . Not on file  Tobacco Use  . Smoking  status: Never Smoker  . Smokeless tobacco: Never Used  Vaping Use  . Vaping Use: Never used  Substance and Sexual Activity  . Alcohol use: Not Currently  . Drug use: Never  . Sexual activity: Yes  Other Topics Concern  . Not on file  Social History Narrative   Right handed    Lives with wife, [name]   Caffeine use: rare   Social Determinants of Health   Financial Resource Strain:   . Difficulty of Paying Living Expenses: Not on file  Food Insecurity:   . Worried About Charity fundraiser in the Last Year: Not on file  . Ran Out of Food in the Last Year: Not on file  Transportation Needs:    . Lack of Transportation (Medical): Not on file  . Lack of Transportation (Non-Medical): Not on file  Physical Activity:   . Days of Exercise per Week: Not on file  . Minutes of Exercise per Session: Not on file  Stress:   . Feeling of Stress : Not on file  Social Connections:   . Frequency of Communication with Friends and Family: Not on file  . Frequency of Social Gatherings with Friends and Family: Not on file  . Attends Religious Services: Not on file  . Active Member of Clubs or Organizations: Not on file  . Attends Archivist Meetings: Not on file  . Marital Status: Not on file    Allergies  Allergen Reactions  . Bactrim [Sulfamethoxazole-Trimethoprim] Rash  . Latex Other (See Comments)    Took skin off when removed tape  . Allopurinol Itching and Rash  . Amoxicillin Rash    Has patient had a PCN reaction causing immediate rash, facial/tongue/throat swelling, SOB or lightheadedness with hypotension: No Has patient had a PCN reaction causing severe rash involving mucus membranes or skin necrosis: No Has patient had a PCN reaction that required hospitalization: No Has patient had a PCN reaction occurring within the last 10 years: No If all of the above answers are "NO", then may proceed with Cephalosporin use.      Current Outpatient Medications:  .  acyclovir (ZOVIRAX) 800 MG tablet, Take 1 tablet (800 mg total) by mouth 5 (five) times daily., Disp: 70 tablet, Rfl: 2 .  cetirizine (ZYRTEC) 10 MG tablet, Take 10 mg by mouth daily., Disp: , Rfl:  .  fluconazole (DIFLUCAN) 200 MG tablet, Take 1 tablet (200 mg total) by mouth daily., Disp: 30 tablet, Rfl: 10 .  Prenatal Vit-Fe Fumarate-FA (PRENATAL MULTIVITAMIN) TABS tablet, Take 1 tablet by mouth daily at 12 noon., Disp: , Rfl:  .  venetoclax 100 MG TABS, Take 200 mg by mouth daily., Disp: 60 tablet, Rfl: 6   Review of Systems  Constitutional: Negative for activity change, appetite change, chills, diaphoresis,  fatigue, fever and unexpected weight change.  HENT: Negative for congestion, rhinorrhea, sinus pressure, sneezing, sore throat and trouble swallowing.   Eyes: Negative for photophobia and visual disturbance.  Respiratory: Negative for cough, chest tightness, shortness of breath, wheezing and stridor.   Cardiovascular: Positive for chest pain. Negative for palpitations and leg swelling.  Gastrointestinal: Negative for abdominal distention, abdominal pain, anal bleeding, blood in stool, constipation, diarrhea, nausea and vomiting.  Genitourinary: Negative for difficulty urinating, dysuria, flank pain and hematuria.  Musculoskeletal: Positive for back pain. Negative for arthralgias, gait problem, joint swelling and myalgias.  Skin: Negative for color change, pallor, rash and wound.  Neurological: Negative for dizziness, tremors, weakness and light-headedness.  Hematological: Negative for adenopathy. Does not bruise/bleed easily.  Psychiatric/Behavioral: Negative for agitation, behavioral problems, confusion, decreased concentration, dysphoric mood and sleep disturbance.       Objective:   Physical Exam Constitutional:      General: He is not in acute distress.    Appearance: Normal appearance. He is well-developed. He is not ill-appearing or diaphoretic.  HENT:     Head: Normocephalic and atraumatic.     Right Ear: Hearing and external ear normal.     Left Ear: Hearing and external ear normal.     Nose: No nasal deformity or rhinorrhea.  Eyes:     General: No scleral icterus.    Extraocular Movements: Extraocular movements intact.     Conjunctiva/sclera: Conjunctivae normal.     Right eye: Right conjunctiva is not injected.     Left eye: Left conjunctiva is not injected.  Neck:     Vascular: No JVD.  Cardiovascular:     Rate and Rhythm: Normal rate and regular rhythm.     Heart sounds: S1 normal and S2 normal. Friction rub present.  Abdominal:     General: Bowel sounds are normal.  There is no distension.     Palpations: Abdomen is soft.     Tenderness: There is no abdominal tenderness.  Musculoskeletal:        General: Normal range of motion.     Right shoulder: Normal.     Left shoulder: Normal.     Cervical back: Normal range of motion and neck supple.     Right hip: Normal.     Left hip: Normal.     Right knee: Normal.     Left knee: Normal.  Lymphadenopathy:     Head:     Right side of head: No submandibular, preauricular or posterior auricular adenopathy.     Left side of head: No submandibular, preauricular or posterior auricular adenopathy.     Cervical: No cervical adenopathy.     Right cervical: No superficial or deep cervical adenopathy.    Left cervical: No superficial or deep cervical adenopathy.  Skin:    General: Skin is warm and dry.     Coloration: Skin is not pale.     Findings: Rash present. No abrasion, bruising, ecchymosis, erythema or lesion.     Nails: There is no clubbing.  Neurological:     Mental Status: He is alert and oriented to person, place, and time.     Sensory: No sensory deficit.     Coordination: Coordination normal.     Gait: Gait normal.  Psychiatric:        Attention and Perception: He is attentive.        Mood and Affect: Mood normal.        Speech: Speech normal.        Behavior: Behavior normal. Behavior is cooperative.        Thought Content: Thought content normal.        Judgment: Judgment normal.             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  While he has achieved 1 year of treatment since diagnosis given his immunocompromise status we will extend this a bit longer.  If he does indeed pursue bone marrow transplantation I would be more eager to trial him off of antifungal therapy is to declare him cured if we can.    CLL following  up with oncology on effective rx  Zoster: Resolving he can have Shingrix in  the future.    COVID-19 prevention: Should get third dose of vaccine.

## 2020-03-13 ENCOUNTER — Other Ambulatory Visit: Payer: Self-pay | Admitting: *Deleted

## 2020-03-13 ENCOUNTER — Encounter: Payer: Self-pay | Admitting: *Deleted

## 2020-03-13 ENCOUNTER — Other Ambulatory Visit: Payer: Self-pay | Admitting: Hematology & Oncology

## 2020-03-13 ENCOUNTER — Encounter: Payer: Self-pay | Admitting: Hematology & Oncology

## 2020-03-13 MED ORDER — VENETOCLAX 100 MG PO TABS: 100.0000 mg | ORAL_TABLET | Freq: Every day | ORAL | 6 refills | Status: DC

## 2020-03-20 MED FILL — VENCLEXTA 100 MG TABS: 100 | 30 days supply | Qty: 60 | Fill #1

## 2020-03-27 ENCOUNTER — Other Ambulatory Visit: Payer: Self-pay | Admitting: *Deleted

## 2020-03-27 ENCOUNTER — Encounter: Payer: Self-pay | Admitting: *Deleted

## 2020-03-27 MED ORDER — ACYCLOVIR 800 MG PO TABS
800.0000 mg | ORAL_TABLET | Freq: Every day | ORAL | 2 refills | Status: DC
Start: 2020-03-27 — End: 2020-07-03

## 2020-04-12 ENCOUNTER — Encounter: Payer: Self-pay | Admitting: *Deleted

## 2020-04-12 ENCOUNTER — Encounter: Payer: Self-pay | Admitting: Hematology & Oncology

## 2020-04-12 ENCOUNTER — Other Ambulatory Visit: Payer: Self-pay

## 2020-04-12 ENCOUNTER — Inpatient Hospital Stay: Payer: BC Managed Care – PPO

## 2020-04-12 ENCOUNTER — Inpatient Hospital Stay: Payer: BC Managed Care – PPO | Attending: Hematology | Admitting: Hematology & Oncology

## 2020-04-12 VITALS — BP 129/99 | HR 85 | Temp 98.2°F | Resp 18 | Wt 238.0 lb

## 2020-04-12 DIAGNOSIS — R5383 Other fatigue: Secondary | ICD-10-CM | POA: Diagnosis not present

## 2020-04-12 DIAGNOSIS — B451 Cerebral cryptococcosis: Secondary | ICD-10-CM | POA: Insufficient documentation

## 2020-04-12 DIAGNOSIS — Z79899 Other long term (current) drug therapy: Secondary | ICD-10-CM | POA: Diagnosis not present

## 2020-04-12 DIAGNOSIS — C9110 Chronic lymphocytic leukemia of B-cell type not having achieved remission: Secondary | ICD-10-CM | POA: Insufficient documentation

## 2020-04-12 LAB — CBC WITH DIFFERENTIAL (CANCER CENTER ONLY)
Abs Immature Granulocytes: 0.04 10*3/uL (ref 0.00–0.07)
Basophils Absolute: 0.1 10*3/uL (ref 0.0–0.1)
Basophils Relative: 1 %
Eosinophils Absolute: 0.3 10*3/uL (ref 0.0–0.5)
Eosinophils Relative: 4 %
HCT: 44.4 % (ref 39.0–52.0)
Hemoglobin: 14.3 g/dL (ref 13.0–17.0)
Immature Granulocytes: 1 %
Lymphocytes Relative: 21 %
Lymphs Abs: 1.5 10*3/uL (ref 0.7–4.0)
MCH: 26 pg (ref 26.0–34.0)
MCHC: 32.2 g/dL (ref 30.0–36.0)
MCV: 80.7 fL (ref 80.0–100.0)
Monocytes Absolute: 0.7 10*3/uL (ref 0.1–1.0)
Monocytes Relative: 10 %
Neutro Abs: 4.5 10*3/uL (ref 1.7–7.7)
Neutrophils Relative %: 63 %
Platelet Count: 205 10*3/uL (ref 150–400)
RBC: 5.5 MIL/uL (ref 4.22–5.81)
RDW: 12.8 % (ref 11.5–15.5)
WBC Count: 7.1 10*3/uL (ref 4.0–10.5)
nRBC: 0 % (ref 0.0–0.2)

## 2020-04-12 LAB — LACTATE DEHYDROGENASE: LDH: 129 U/L (ref 98–192)

## 2020-04-12 LAB — CMP (CANCER CENTER ONLY)
ALT: 26 U/L (ref 0–44)
AST: 15 U/L (ref 15–41)
Albumin: 4.5 g/dL (ref 3.5–5.0)
Alkaline Phosphatase: 60 U/L (ref 38–126)
Anion gap: 6 (ref 5–15)
BUN: 14 mg/dL (ref 6–20)
CO2: 28 mmol/L (ref 22–32)
Calcium: 9.7 mg/dL (ref 8.9–10.3)
Chloride: 107 mmol/L (ref 98–111)
Creatinine: 0.99 mg/dL (ref 0.61–1.24)
GFR, Estimated: >60 mL/min (ref >60)
Glucose, Bld: 96 mg/dL (ref 70–99)
Potassium: 4.1 mmol/L (ref 3.5–5.1)
Sodium: 141 mmol/L (ref 135–145)
Total Bilirubin: 0.2 mg/dL — ABNORMAL LOW (ref 0.3–1.2)
Total Protein: 6.4 g/dL — ABNORMAL LOW (ref 6.5–8.1)

## 2020-04-12 LAB — SAVE SMEAR(SSMR), FOR PROVIDER SLIDE REVIEW

## 2020-04-12 NOTE — Progress Notes (Signed)
Hematology and Oncology Follow Up Visit  David Whitehead [id number] 27-Sep-1970 49 y.o. 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 daily       Venetoclax 100 mg po q day -- start on 01/24/2020 -     Interim History:  David Whitehead is back for follow-up.  So far, everything is going quite well.  He has responded nicely to the venetoclax.  His white cell count continues to respond.  His percentage of lymphocytes is down to 21% now.  He still has some fatigue.  I suspect this probably is from the venetoclax.  There is been no problems with rashes.  He has had a little bit of pain over on the right side of his abdomen.  I am unsure exactly what this is.  This is not associated with eating.  There is no rash.  Is not associated with diarrhea.  There is been no problems with cough or shortness of breath.  He is still on Diflucan because of the cryptococcal meningitis.  Infectious disease is following him along for this.  He has had no headache.  Overall, his performance status is ECOG 0  Medications:  Current Outpatient Medications:  .  acyclovir (ZOVIRAX) 800 MG tablet, Take 1 tablet (800 mg total) by mouth 5 (five) times daily., Disp: 70 tablet, Rfl: 2 .  cetirizine (ZYRTEC) 10 MG tablet, Take 10 mg by mouth daily., Disp: , Rfl:  .  fluconazole (DIFLUCAN) 200 MG tablet, Take 1 tablet (200 mg total) by mouth daily., Disp: 30 tablet, Rfl: 10 .  Prenatal Vit-Fe Fumarate-FA (PRENATAL MULTIVITAMIN) TABS tablet, Take 1 tablet by mouth daily at 12 noon., Disp: , Rfl:  .  venetoclax 100 MG TABS, Take 100 mg by mouth daily., Disp: 30 tablet, Rfl: 6  Allergies:  Allergies  Allergen Reactions  . Bactrim [Sulfamethoxazole-Trimethoprim] Rash  . Latex Other (See Comments)    Took skin off when removed tape  . Allopurinol Itching and Rash  . Amoxicillin Rash    Has patient had a  PCN reaction causing immediate rash, facial/tongue/throat swelling, SOB or lightheadedness with hypotension: No Has patient had a PCN reaction causing severe rash involving mucus membranes or skin necrosis: No Has patient had a PCN reaction that required hospitalization: No Has patient had a PCN reaction occurring within the last 10 years: No If all of the above answers are "NO", then may proceed with Cephalosporin use.     Past Medical History, Surgical history, Social history, and Family History were reviewed and updated.  Review of Systems: Review of Systems  Constitutional: Negative.   HENT:  Negative.   Eyes: Negative.   Respiratory: Negative.   Cardiovascular: Negative.   Gastrointestinal: Negative.   Endocrine: Negative.   Genitourinary: Negative.    Musculoskeletal: Negative.   Skin: Negative.   Neurological: Negative.   Hematological: Negative.   Psychiatric/Behavioral: Negative.     Physical Exam:  weight is 238 lb (108 kg). His oral temperature is 98.2 F (36.8 C). His blood pressure is 129/99 (abnormal) and his pulse is 85. His respiration is 18 and oxygen saturation is 100%.   Wt Readings from Last 3 Encounters:  04/12/20 238 lb (108 kg)  03/12/20 239 lb (108.4 kg)  02/23/20 230 lb 12.8 oz (104.7 kg)    Physical Exam Vitals reviewed.  HENT:     Head: Normocephalic and  atraumatic.  Eyes:     Pupils: Pupils are equal, round, and reactive to light.  Neck:     Comments: On his neck exam, I really cannot palpate any adenopathy at this point.  There is no cervical or supraclavicular lymph nodes.  Cardiovascular:     Rate and Rhythm: Normal rate and regular rhythm.     Heart sounds: Normal heart sounds.  Pulmonary:     Effort: Pulmonary effort is normal.     Breath sounds: Normal breath sounds.  Abdominal:     General: Bowel sounds are normal.     Palpations: Abdomen is soft.  Musculoskeletal:        General: No tenderness or deformity. Normal range of  motion.     Cervical back: Normal range of motion.  Lymphadenopathy:     Cervical: No cervical adenopathy.  Skin:    General: Skin is warm and dry.     Findings: No erythema or rash.  Neurological:     Mental Status: He is alert and oriented to person, place, and time.  Psychiatric:        Behavior: Behavior normal.        Thought Content: Thought content normal.        Judgment: Judgment normal.      Lab Results  Component Value Date   WBC 7.1 04/12/2020   HGB 14.3 04/12/2020   HCT 44.4 04/12/2020   MCV 80.7 04/12/2020   PLT 205 04/12/2020     Chemistry      Component Value Date/Time   NA 141 04/12/2020 1434   NA 140 03/08/2019 1438   K 4.1 04/12/2020 1434   CL 107 04/12/2020 1434   CO2 28 04/12/2020 1434   BUN 14 04/12/2020 1434   BUN 25 (H) 03/08/2019 1438   CREATININE 0.99 04/12/2020 1434   CREATININE 1.15 10/26/2019 0920      Component Value Date/Time   CALCIUM 9.7 04/12/2020 1434   ALKPHOS 60 04/12/2020 1434   AST 15 04/12/2020 1434   ALT 26 04/12/2020 1434   BILITOT 0.2 (L) 04/12/2020 1434      Impression and Plan:  David Whitehead is a very nice 49 year old white male.  He has CLL.  I must say that he certainly is quite young for CLL.  He clearly has an aggressive CLL by virtue of the cytogenetics.  We will continue on the low-dose venetoclax.  I will plan to get him back in 2 months now.  We see him back, we will do flow cytometry on his peripheral blood and see if he does have a monoclonal B cells.  I also would like to do a CT scan on him so we can see how everything looks on his scans.       Volanda Napoleon, MD 12/2/20213:28 PM

## 2020-04-13 ENCOUNTER — Encounter: Payer: Self-pay | Admitting: *Deleted

## 2020-04-13 NOTE — Progress Notes (Signed)
Patient will need CT scans prior to his next appointment. These were scheduled. Information regarding scans and prep sent to patient via MyChart.   Oncology Nurse Navigator Documentation  Oncology Nurse Navigator Flowsheets 04/12/2020  Abnormal Finding Date -  Confirmed Diagnosis Date -  Diagnosis Status -  Phase of Treatment -  Chemotherapy Actual Start Date: -  Expected Surgery Date -  Navigator Follow Up Date: 06/12/2020  Navigator Follow Up Reason: Follow-up Appointment;Scan Review  Navigator Location [facility]  Referral Date to RadOnc/MedOnc -  Navigator Encounter Type Follow-up Appt;Appt/Treatment Plan Review;MyChart  Telephone -  Treatment Initiated Date -  Patient Visit Type MedOnc  Treatment Phase Active Tx  Barriers/Navigation Needs Coordination of Care;Education  Education Other  Interventions Coordination of Care;Education;Psycho-Social Support  Acuity Level 2-Minimal Needs (1-2 Barriers Identified)  Referrals -  Coordination of Care Appts;Radiology  Education Method Written  Support Groups/Services Friends and Family  Time Spent with Patient 30

## 2020-04-18 DIAGNOSIS — Z029 Encounter for administrative examinations, unspecified: Secondary | ICD-10-CM

## 2020-04-24 MED FILL — VENCLEXTA 100 MG TABS: 100 | 30 days supply | Qty: 30 | Fill #0

## 2020-05-24 ENCOUNTER — Telehealth: Payer: Self-pay | Admitting: Pharmacy Technician

## 2020-05-24 MED FILL — VENCLEXTA 100 MG TABS: 100 | 30 days supply | Qty: 30 | Fill #1

## 2020-05-24 NOTE — Telephone Encounter (Addendum)
Oral Oncology Patient Advocate Encounter   Was successful in obtaining a copay card for [name].  This copay card will make the patients copay $5.00.  The billing information is as follows and has been shared with [employer].   RxBin: [id number] Member ID: [id number] No Group   David Whitehead David Whitehead [place] Patient [place] Phone [phone] Fax [phone] 05/24/2020 11:33 AM

## 2020-06-12 ENCOUNTER — Inpatient Hospital Stay (HOSPITAL_BASED_OUTPATIENT_CLINIC_OR_DEPARTMENT_OTHER): Payer: BC Managed Care – PPO | Admitting: Hematology & Oncology

## 2020-06-12 ENCOUNTER — Inpatient Hospital Stay: Payer: BC Managed Care – PPO | Attending: Hematology

## 2020-06-12 ENCOUNTER — Encounter: Payer: Self-pay | Admitting: *Deleted

## 2020-06-12 ENCOUNTER — Encounter: Payer: Self-pay | Admitting: Hematology & Oncology

## 2020-06-12 ENCOUNTER — Encounter (HOSPITAL_BASED_OUTPATIENT_CLINIC_OR_DEPARTMENT_OTHER): Payer: Self-pay

## 2020-06-12 ENCOUNTER — Other Ambulatory Visit: Payer: Self-pay

## 2020-06-12 ENCOUNTER — Ambulatory Visit (HOSPITAL_BASED_OUTPATIENT_CLINIC_OR_DEPARTMENT_OTHER)
Admission: RE | Admit: 2020-06-12 | Discharge: 2020-06-12 | Disposition: A | Discharge status: Home / Self Care | Payer: BC Managed Care – PPO | Source: Ambulatory Visit | Attending: Hematology & Oncology | Admitting: Hematology & Oncology

## 2020-06-12 VITALS — BP 142/94 | HR 76 | Temp 98.0°F | Resp 18 | Wt 237.8 lb

## 2020-06-12 DIAGNOSIS — C9110 Chronic lymphocytic leukemia of B-cell type not having achieved remission: Secondary | ICD-10-CM

## 2020-06-12 DIAGNOSIS — R911 Solitary pulmonary nodule: Secondary | ICD-10-CM | POA: Diagnosis not present

## 2020-06-12 DIAGNOSIS — R918 Other nonspecific abnormal finding of lung field: Secondary | ICD-10-CM | POA: Diagnosis not present

## 2020-06-12 DIAGNOSIS — Z8661 Personal history of infections of the central nervous system: Secondary | ICD-10-CM | POA: Insufficient documentation

## 2020-06-12 DIAGNOSIS — R590 Localized enlarged lymph nodes: Secondary | ICD-10-CM | POA: Diagnosis not present

## 2020-06-12 DIAGNOSIS — Z79899 Other long term (current) drug therapy: Secondary | ICD-10-CM | POA: Insufficient documentation

## 2020-06-12 DIAGNOSIS — K8020 Calculus of gallbladder without cholecystitis without obstruction: Secondary | ICD-10-CM | POA: Diagnosis not present

## 2020-06-12 DIAGNOSIS — R161 Splenomegaly, not elsewhere classified: Secondary | ICD-10-CM | POA: Diagnosis not present

## 2020-06-12 LAB — CBC WITH DIFFERENTIAL (CANCER CENTER ONLY)
Abs Immature Granulocytes: 0.05 10*3/uL (ref 0.00–0.07)
Basophils Absolute: 0.1 10*3/uL (ref 0.0–0.1)
Basophils Relative: 1 %
Eosinophils Absolute: 0.2 10*3/uL (ref 0.0–0.5)
Eosinophils Relative: 2 %
HCT: 42.8 % (ref 39.0–52.0)
Hemoglobin: 13.9 g/dL (ref 13.0–17.0)
Immature Granulocytes: 1 %
Lymphocytes Relative: 18 %
Lymphs Abs: 1.2 10*3/uL (ref 0.7–4.0)
MCH: 25.2 pg — ABNORMAL LOW (ref 26.0–34.0)
MCHC: 32.5 g/dL (ref 30.0–36.0)
MCV: 77.5 fL — ABNORMAL LOW (ref 80.0–100.0)
Monocytes Absolute: 0.6 10*3/uL (ref 0.1–1.0)
Monocytes Relative: 8 %
Neutro Abs: 4.8 10*3/uL (ref 1.7–7.7)
Neutrophils Relative %: 70 %
Platelet Count: 200 10*3/uL (ref 150–400)
RBC: 5.52 MIL/uL (ref 4.22–5.81)
RDW: 13.3 % (ref 11.5–15.5)
WBC Count: 6.8 10*3/uL (ref 4.0–10.5)
nRBC: 0 % (ref 0.0–0.2)

## 2020-06-12 LAB — CMP (CANCER CENTER ONLY)
ALT: 27 U/L (ref 0–44)
AST: 16 U/L (ref 15–41)
Albumin: 4.5 g/dL (ref 3.5–5.0)
Alkaline Phosphatase: 61 U/L (ref 38–126)
Anion gap: 7 (ref 5–15)
BUN: 15 mg/dL (ref 6–20)
CO2: 29 mmol/L (ref 22–32)
Calcium: 9.8 mg/dL (ref 8.9–10.3)
Chloride: 103 mmol/L (ref 98–111)
Creatinine: 1.05 mg/dL (ref 0.61–1.24)
GFR, Estimated: >60 mL/min (ref >60)
Glucose, Bld: 108 mg/dL — ABNORMAL HIGH (ref 70–99)
Potassium: 4.4 mmol/L (ref 3.5–5.1)
Sodium: 139 mmol/L (ref 135–145)
Total Bilirubin: 0.4 mg/dL (ref 0.3–1.2)
Total Protein: 6.6 g/dL (ref 6.5–8.1)

## 2020-06-12 LAB — LACTATE DEHYDROGENASE: LDH: 116 U/L (ref 98–192)

## 2020-06-12 LAB — SURGICAL PATHOLOGY

## 2020-06-12 LAB — SAVE SMEAR(SSMR), FOR PROVIDER SLIDE REVIEW

## 2020-06-12 MED ORDER — IOHEXOL 300 MG/ML  SOLN
100.0000 mL | Freq: Once | INTRAMUSCULAR | Status: AC | PRN
  Administered 2020-06-12: 100 mL via INTRAVENOUS

## 2020-06-12 NOTE — Progress Notes (Signed)
Hematology and Oncology Follow Up Visit  David Whitehead [id number] Feb 01, 1971 50 y.o. 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 daily       Venetoclax 100 mg po q day -- start on 01/24/2020 -     Interim History:  David Whitehead is back for follow-up.  We last saw him before Christmas.  He is doing quite nicely.  He is tolerating the venetoclax well.  He is on 900 mg a day as he is also on Diflucan.  He did have a CT scan done today.  The results not back yet.  His white cell count is holding steady.  What is nice is that the lymphocytes are going down.  He has had no problems with fever.  He has had no issues with the cryptococcal meningitis.  He has had no headache.  He has had no stiff neck.  There is been no swollen lymph nodes.  He has had no rashes.  There has been no change in bowel or bladder habits.  He has had no leg swelling.  Overall, I would say his performance status is ECOG 1.    Medications:  Current Outpatient Medications:  .  acyclovir (ZOVIRAX) 800 MG tablet, Take 1 tablet (800 mg total) by mouth 5 (five) times daily., Disp: 70 tablet, Rfl: 2 .  cetirizine (ZYRTEC) 10 MG tablet, Take 10 mg by mouth daily., Disp: , Rfl:  .  fluconazole (DIFLUCAN) 200 MG tablet, Take 1 tablet (200 mg total) by mouth daily., Disp: 30 tablet, Rfl: 10 .  Prenatal Vit-Fe Fumarate-FA (PRENATAL MULTIVITAMIN) TABS tablet, Take 1 tablet by mouth daily at 12 noon., Disp: , Rfl:  .  venetoclax 100 MG TABS, Take 100 mg by mouth daily., Disp: 30 tablet, Rfl: 6  Allergies:  Allergies  Allergen Reactions  . Bactrim [Sulfamethoxazole-Trimethoprim] Rash  . Latex Other (See Comments)    Took skin off when removed tape  . Allopurinol Itching and Rash  . Amoxicillin Rash    Has patient had a PCN reaction causing immediate rash, facial/tongue/throat swelling, SOB or lightheadedness  with hypotension: No Has patient had a PCN reaction causing severe rash involving mucus membranes or skin necrosis: No Has patient had a PCN reaction that required hospitalization: No Has patient had a PCN reaction occurring within the last 10 years: No If all of the above answers are "NO", then may proceed with Cephalosporin use.     Past Medical History, Surgical history, Social history, and Family History were reviewed and updated.  Review of Systems: Review of Systems  Constitutional: Negative.   HENT:  Negative.   Eyes: Negative.   Respiratory: Negative.   Cardiovascular: Negative.   Gastrointestinal: Negative.   Endocrine: Negative.   Genitourinary: Negative.    Musculoskeletal: Negative.   Skin: Negative.   Neurological: Negative.   Hematological: Negative.   Psychiatric/Behavioral: Negative.     Physical Exam:  weight is 237 lb 12 oz (107.8 kg). His oral temperature is 98 F (36.7 C). His blood pressure is 142/94 (abnormal) and his pulse is 76. His respiration is 18 and oxygen saturation is 100%.   Wt Readings from Last 3 Encounters:  06/12/20 237 lb 12 oz (107.8 kg)  04/12/20 238 lb (108 kg)  03/12/20 239 lb (108.4 kg)    Physical Exam Vitals reviewed.  HENT:     Head: Normocephalic and  atraumatic.  Eyes:     Pupils: Pupils are equal, round, and reactive to light.  Neck:     Comments: On his neck exam, I really cannot palpate any adenopathy at this point.  There is no cervical or supraclavicular lymph nodes.  Cardiovascular:     Rate and Rhythm: Normal rate and regular rhythm.     Heart sounds: Normal heart sounds.  Pulmonary:     Effort: Pulmonary effort is normal.     Breath sounds: Normal breath sounds.  Abdominal:     General: Bowel sounds are normal.     Palpations: Abdomen is soft.  Musculoskeletal:        General: No tenderness or deformity. Normal range of motion.     Cervical back: Normal range of motion.  Lymphadenopathy:     Cervical: No  cervical adenopathy.  Skin:    General: Skin is warm and dry.     Findings: No erythema or rash.  Neurological:     Mental Status: He is alert and oriented to person, place, and time.  Psychiatric:        Behavior: Behavior normal.        Thought Content: Thought content normal.        Judgment: Judgment normal.      Lab Results  Component Value Date   WBC 6.8 06/12/2020   HGB 13.9 06/12/2020   HCT 42.8 06/12/2020   MCV 77.5 (L) 06/12/2020   PLT 200 06/12/2020     Chemistry      Component Value Date/Time   NA 139 06/12/2020 0845   NA 140 03/08/2019 1438   K 4.4 06/12/2020 0845   CL 103 06/12/2020 0845   CO2 29 06/12/2020 0845   BUN 15 06/12/2020 0845   BUN 25 (H) 03/08/2019 1438   CREATININE 1.05 06/12/2020 0845   CREATININE 1.15 10/26/2019 0920      Component Value Date/Time   CALCIUM 9.8 06/12/2020 0845   ALKPHOS 61 06/12/2020 0845   AST 16 06/12/2020 0845   ALT 27 06/12/2020 0845   BILITOT 0.4 06/12/2020 0845      Impression and Plan:  David Whitehead is a very nice 50 year old white male.  He has CLL.  I must say that he certainly is quite young for CLL.  He clearly has an aggressive CLL by virtue of the cytogenetics.  We will continue on the low-dose venetoclax.  I think this is working nicely for him.  I think it is not compromising his immune system and so that the cryptococcal meningitis may not reactivate.  We will have to see what the CT scan shows.  I will get him back now in another couple months.  We will try to move his appointments out slowly.       Volanda Napoleon, MD 2/1/202210:46 AM

## 2020-06-13 LAB — IGG, IGA, IGM
IgA: 11 mg/dL — ABNORMAL LOW (ref 90–386)
IgG (Immunoglobin G), Serum: 464 mg/dL — ABNORMAL LOW (ref 603–1613)
IgM (Immunoglobulin M), Srm: <5 mg/dL — ABNORMAL LOW (ref 20–172)

## 2020-06-13 LAB — FLOW CYTOMETRY

## 2020-06-13 NOTE — Progress Notes (Signed)
**  Late Entry**  Oncology Nurse Navigator Documentation  Oncology Nurse Navigator Flowsheets 06/12/2020  Abnormal Finding Date -  Confirmed Diagnosis Date -  Diagnosis Status -  Phase of Treatment -  Chemotherapy Actual Start Date: -  Expected Surgery Date -  Navigator Follow Up Date: 08/21/2020  Navigator Follow Up Reason: Follow-up Appointment  Navigator Location [facility]  Referral Date to RadOnc/MedOnc -  Navigator Encounter Type Follow-up Appt;Scan Review  Telephone -  Treatment Initiated Date -  Patient Visit Type MedOnc  Treatment Phase Active Tx  Barriers/Navigation Needs Coordination of Care;Education  Education -  Interventions Psycho-Social Support  Acuity Level 2-Minimal Needs (1-2 Barriers Identified)  Referrals -  Coordination of Care -  Education Method -  Support Groups/Services Friends and Family  Time Spent with Patient 30

## 2020-06-14 LAB — BETA 2 MICROGLOBULIN, SERUM: Beta-2 Microglobulin: 1.7 mg/L (ref 0.6–2.4)

## 2020-06-28 MED FILL — VENCLEXTA 100 MG TABS: 100 | 30 days supply | Qty: 30 | Fill #2

## 2020-07-03 ENCOUNTER — Encounter: Payer: Self-pay | Admitting: *Deleted

## 2020-07-03 ENCOUNTER — Other Ambulatory Visit: Payer: Self-pay | Admitting: *Deleted

## 2020-07-03 DIAGNOSIS — C9110 Chronic lymphocytic leukemia of B-cell type not having achieved remission: Secondary | ICD-10-CM

## 2020-07-03 MED ORDER — ACYCLOVIR 800 MG PO TABS: 800.0000 mg | ORAL_TABLET | Freq: Every day | ORAL | 1 refills | Status: DC

## 2020-07-30 MED FILL — VENCLEXTA 100 MG TABS: 100 | 30 days supply | Qty: 30 | Fill #3

## 2020-08-07 ENCOUNTER — Other Ambulatory Visit (HOSPITAL_COMMUNITY): Payer: Self-pay

## 2020-08-16 ENCOUNTER — Other Ambulatory Visit (HOSPITAL_COMMUNITY): Payer: Self-pay

## 2020-08-21 ENCOUNTER — Inpatient Hospital Stay: Payer: BC Managed Care – PPO | Attending: Hematology

## 2020-08-21 ENCOUNTER — Other Ambulatory Visit: Payer: Self-pay

## 2020-08-21 ENCOUNTER — Inpatient Hospital Stay (HOSPITAL_BASED_OUTPATIENT_CLINIC_OR_DEPARTMENT_OTHER): Payer: BC Managed Care – PPO | Admitting: Hematology & Oncology

## 2020-08-21 ENCOUNTER — Telehealth: Payer: Self-pay

## 2020-08-21 ENCOUNTER — Encounter: Payer: Self-pay | Admitting: *Deleted

## 2020-08-21 ENCOUNTER — Encounter: Payer: Self-pay | Admitting: Hematology & Oncology

## 2020-08-21 VITALS — BP 134/91 | HR 92 | Temp 98.0°F | Resp 20 | Wt 240.0 lb

## 2020-08-21 DIAGNOSIS — C9110 Chronic lymphocytic leukemia of B-cell type not having achieved remission: Secondary | ICD-10-CM

## 2020-08-21 LAB — CMP (CANCER CENTER ONLY)
ALT: 61 U/L — ABNORMAL HIGH (ref 0–44)
AST: 30 U/L (ref 15–41)
Albumin: 4.6 g/dL (ref 3.5–5.0)
Alkaline Phosphatase: 55 U/L (ref 38–126)
Anion gap: 7 (ref 5–15)
BUN: 15 mg/dL (ref 6–20)
CO2: 27 mmol/L (ref 22–32)
Calcium: 9.7 mg/dL (ref 8.9–10.3)
Chloride: 104 mmol/L (ref 98–111)
Creatinine: 1.04 mg/dL (ref 0.61–1.24)
GFR, Estimated: >60 mL/min (ref >60)
Glucose, Bld: 107 mg/dL — ABNORMAL HIGH (ref 70–99)
Potassium: 4 mmol/L (ref 3.5–5.1)
Sodium: 138 mmol/L (ref 135–145)
Total Bilirubin: 0.4 mg/dL (ref 0.3–1.2)
Total Protein: 6.4 g/dL — ABNORMAL LOW (ref 6.5–8.1)

## 2020-08-21 LAB — CBC WITH DIFFERENTIAL (CANCER CENTER ONLY)
Abs Immature Granulocytes: 0.05 10*3/uL (ref 0.00–0.07)
Basophils Absolute: 0 10*3/uL (ref 0.0–0.1)
Basophils Relative: 1 %
Eosinophils Absolute: 0.1 10*3/uL (ref 0.0–0.5)
Eosinophils Relative: 2 %
HCT: 48.2 % (ref 39.0–52.0)
Hemoglobin: 15.7 g/dL (ref 13.0–17.0)
Immature Granulocytes: 1 %
Lymphocytes Relative: 27 %
Lymphs Abs: 1.5 10*3/uL (ref 0.7–4.0)
MCH: 25.4 pg — ABNORMAL LOW (ref 26.0–34.0)
MCHC: 32.6 g/dL (ref 30.0–36.0)
MCV: 77.9 fL — ABNORMAL LOW (ref 80.0–100.0)
Monocytes Absolute: 0.6 10*3/uL (ref 0.1–1.0)
Monocytes Relative: 11 %
Neutro Abs: 3.2 10*3/uL (ref 1.7–7.7)
Neutrophils Relative %: 58 %
Platelet Count: 198 10*3/uL (ref 150–400)
RBC: 6.19 MIL/uL — ABNORMAL HIGH (ref 4.22–5.81)
RDW: 14.8 % (ref 11.5–15.5)
WBC Count: 5.5 10*3/uL (ref 4.0–10.5)
nRBC: 0 % (ref 0.0–0.2)

## 2020-08-21 LAB — LACTATE DEHYDROGENASE: LDH: 136 U/L (ref 98–192)

## 2020-08-21 MED ORDER — AZITHROMYCIN 250 MG PO TABS: ORAL_TABLET | ORAL | 0 refills | Status: DC

## 2020-08-21 NOTE — Progress Notes (Signed)
Hematology and Oncology Follow Up Visit  David Whitehead [id number] [birth year] 50 y.o. 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 daily       Venetoclax 100 mg po q day -- start on 01/24/2020 -     Interim History:  David Whitehead is back for follow-up.  He really is doing quite nicely on single agent venetoclax.  We did a CT scan on him back in February.  This showed a marked decrease in splenomegaly.  He had marked decrease in lymphadenopathy in the chest, abdomen and pelvis.  He is having some sinus problems.  Some of this might be from him having a mildly depressed IgG level.  Back in February, his IgG level was 462 mg/dL.  We may have to think about supplemental IVIG if we cannot get this infection better.  We will go ahead and plan on giving him some Z-Pak.  He is still working.  He is having no problems at work.  There is been no issues with the cryptococcal meningitis.  He is on prophylactic Diflucan.  We will have to see what the infectious disease doctors have to say about all this.  His appetite is good.  He has no nausea or vomiting.  There is no rashes.  He has had no cough.  He has had no bony pain.  Overall, his performance status is ECOG 1.    Medications:  Current Outpatient Medications:  .  acyclovir (ZOVIRAX) 800 MG tablet, Take 1 tablet (800 mg total) by mouth 5 (five) times daily. (Patient taking differently: Take 800 mg by mouth 2 (two) times daily.), Disp: 70 tablet, Rfl: 1 .  cetirizine (ZYRTEC) 10 MG tablet, Take 10 mg by mouth daily., Disp: , Rfl:  .  fluconazole (DIFLUCAN) 200 MG tablet, Take 1 tablet (200 mg total) by mouth daily., Disp: 30 tablet, Rfl: 10 .  Prenatal Vit-Fe Fumarate-FA (PRENATAL MULTIVITAMIN) TABS tablet, Take 1 tablet by mouth daily at 12 noon., Disp: , Rfl:  .  venetoclax (VENCLEXTA) 100 MG tablet, TAKE 1 TABLET (100 MG) BY  MOUTH DAILY., Disp: 30 tablet, Rfl: 6  Allergies:  Allergies  Allergen Reactions  . Bactrim [Sulfamethoxazole-Trimethoprim] Rash  . Doxycycline Calcium Itching    "felt like pins and needles were in face after one dose."  . Latex Other (See Comments)    Took skin off when removed tape  . Allopurinol Itching and Rash  . Amoxicillin Rash    Has patient had a PCN reaction causing immediate rash, facial/tongue/throat swelling, SOB or lightheadedness with hypotension: No Has patient had a PCN reaction causing severe rash involving mucus membranes or skin necrosis: No Has patient had a PCN reaction that required hospitalization: No Has patient had a PCN reaction occurring within the last 10 years: No If all of the above answers are "NO", then may proceed with Cephalosporin use.     Past Medical History, Surgical history, Social history, and Family History were reviewed and updated.  Review of Systems: Review of Systems  Constitutional: Negative.   HENT:  Negative.   Eyes: Negative.   Respiratory: Negative.   Cardiovascular: Negative.   Gastrointestinal: Negative.   Endocrine: Negative.   Genitourinary: Negative.    Musculoskeletal: Negative.   Skin: Negative.   Neurological: Negative.   Hematological: Negative.   Psychiatric/Behavioral: Negative.     Physical Exam:  weight is  240 lb 0.6 oz (108.9 kg). His oral temperature is 98 F (36.7 C). His blood pressure is 134/91 (abnormal) and his pulse is 92. His respiration is 20 and oxygen saturation is 98%.   Wt Readings from Last 3 Encounters:  08/21/20 240 lb 0.6 oz (108.9 kg)  06/12/20 237 lb 12 oz (107.8 kg)  04/12/20 238 lb (108 kg)    Physical Exam Vitals reviewed.  HENT:     Head: Normocephalic and atraumatic.  Eyes:     Pupils: Pupils are equal, round, and reactive to light.  Neck:     Comments: On his neck exam, I really cannot palpate any adenopathy at this point.  There is no cervical or supraclavicular lymph  nodes.  Cardiovascular:     Rate and Rhythm: Normal rate and regular rhythm.     Heart sounds: Normal heart sounds.  Pulmonary:     Effort: Pulmonary effort is normal.     Breath sounds: Normal breath sounds.  Abdominal:     General: Bowel sounds are normal.     Palpations: Abdomen is soft.  Musculoskeletal:        General: No tenderness or deformity. Normal range of motion.     Cervical back: Normal range of motion.  Lymphadenopathy:     Cervical: No cervical adenopathy.  Skin:    General: Skin is warm and dry.     Findings: No erythema or rash.  Neurological:     Mental Status: He is alert and oriented to person, place, and time.  Psychiatric:        Behavior: Behavior normal.        Thought Content: Thought content normal.        Judgment: Judgment normal.      Lab Results  Component Value Date   WBC 5.5 08/21/2020   HGB 15.7 08/21/2020   HCT 48.2 08/21/2020   MCV 77.9 (L) 08/21/2020   PLT 198 08/21/2020     Chemistry      Component Value Date/Time   NA 138 08/21/2020 1128   NA 140 03/08/2019 1438   K 4.0 08/21/2020 1128   CL 104 08/21/2020 1128   CO2 27 08/21/2020 1128   BUN 15 08/21/2020 1128   BUN 25 (H) 03/08/2019 1438   CREATININE 1.04 08/21/2020 1128   CREATININE 1.15 10/26/2019 0920      Component Value Date/Time   CALCIUM 9.7 08/21/2020 1128   ALKPHOS 55 08/21/2020 1128   AST 30 08/21/2020 1128   ALT 61 (H) 08/21/2020 1128   BILITOT 0.4 08/21/2020 1128      Impression and Plan:  David Whitehead is a very nice 50 year old white male.  He has CLL.  I must say that he certainly is quite young for CLL.  He clearly has an aggressive CLL by virtue of the cytogenetics.  We will continue on the low-dose venetoclax.  I think this is working nicely for him.  We will have to watch out for the immunoglobulin status.  Again, if this sinus problem does not improve, we may have to think about supplemental IVIG.  The CT scans are incredibly encouraging.  He  really has had a nice response.  He says he sees the Infectious Disease doctor in May.  I will see him back in 3 months now.  I do not see that we have to do any scans on him.       Volanda Napoleon, MD 4/12/202212:25 PM

## 2020-08-21 NOTE — Telephone Encounter (Signed)
appts made per 08/21/20 los and pt placed information in phone   David Whitehead

## 2020-08-21 NOTE — Progress Notes (Signed)
Spoke with patient prior to his MD appointment. He is doing well. Continues to be out of work. 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 He has limited symptoms, but states his energy levels remain low, especially after activity and he states "I just listen to my body". He has had trouble with a sinus infection.   Oncology Nurse Navigator Documentation  Oncology Nurse Navigator Flowsheets 08/21/2020  Abnormal Finding Date -  Confirmed Diagnosis Date -  Diagnosis Status -  Phase of Treatment -  Chemotherapy Actual Start Date: -  Expected Surgery Date -  Navigator Follow Up Date: 11/21/2020  Navigator Follow Up Reason: Follow-up Appointment  Navigator Location [facility]  Referral Date to RadOnc/MedOnc -  Navigator Encounter Type Follow-up Appt  Telephone -  Treatment Initiated Date -  Patient Visit Type MedOnc  Treatment Phase Active Tx  Barriers/Navigation Needs Coordination of Care;Education  Education Other  Interventions Education;Psycho-Social Support  Acuity Level 2-Minimal Needs (1-2 Barriers Identified)  Referrals -  Coordination of Care -  Education Method Verbal  Support Groups/Services Friends and Family  Time Spent with Patient 30

## 2020-08-22 ENCOUNTER — Other Ambulatory Visit (HOSPITAL_COMMUNITY): Payer: Self-pay

## 2020-08-22 LAB — IGG, IGA, IGM
IgA: 12 mg/dL — ABNORMAL LOW (ref 90–386)
IgG (Immunoglobin G), Serum: 503 mg/dL — ABNORMAL LOW (ref 603–1613)
IgM (Immunoglobulin M), Srm: <5 mg/dL — ABNORMAL LOW (ref 20–172)

## 2020-08-23 LAB — PROTEIN ELECTROPHORESIS, SERUM, WITH REFLEX
A/G Ratio: 1.8 — ABNORMAL HIGH (ref 0.7–1.7)
Albumin ELP: 4.2 g/dL (ref 2.9–4.4)
Alpha-1-Globulin: 0.2 g/dL (ref 0.0–0.4)
Alpha-2-Globulin: 0.6 g/dL (ref 0.4–1.0)
Beta Globulin: 1.1 g/dL (ref 0.7–1.3)
Gamma Globulin: 0.5 g/dL (ref 0.4–1.8)
Globulin, Total: 2.4 g/dL (ref 2.2–3.9)
Total Protein ELP: 6.6 g/dL (ref 6.0–8.5)

## 2020-08-24 ENCOUNTER — Other Ambulatory Visit (HOSPITAL_COMMUNITY): Payer: Self-pay

## 2020-08-24 MED FILL — Venetoclax Tab 100 MG: ORAL | 30 days supply | Qty: 30 | Fill #0 | Status: AC

## 2020-08-28 ENCOUNTER — Other Ambulatory Visit (HOSPITAL_COMMUNITY): Payer: Self-pay

## 2020-09-03 DIAGNOSIS — Z0271 Encounter for disability determination: Secondary | ICD-10-CM

## 2020-09-17 ENCOUNTER — Other Ambulatory Visit: Payer: Self-pay

## 2020-09-17 ENCOUNTER — Encounter: Payer: Self-pay | Admitting: Infectious Disease

## 2020-09-17 ENCOUNTER — Ambulatory Visit (INDEPENDENT_AMBULATORY_CARE_PROVIDER_SITE_OTHER): Payer: BC Managed Care – PPO | Admitting: Infectious Disease

## 2020-09-17 VITALS — BP 134/94 | HR 85 | Temp 98.3°F | Wt 245.0 lb

## 2020-09-17 DIAGNOSIS — B451 Cerebral cryptococcosis: Secondary | ICD-10-CM

## 2020-09-17 DIAGNOSIS — W57XXXA Bitten or stung by nonvenomous insect and other nonvenomous arthropods, initial encounter: Secondary | ICD-10-CM | POA: Diagnosis not present

## 2020-09-17 DIAGNOSIS — D801 Nonfamilial hypogammaglobulinemia: Secondary | ICD-10-CM | POA: Diagnosis not present

## 2020-09-17 DIAGNOSIS — M791 Myalgia, unspecified site: Secondary | ICD-10-CM | POA: Insufficient documentation

## 2020-09-17 DIAGNOSIS — S1096XA Insect bite of unspecified part of neck, initial encounter: Secondary | ICD-10-CM

## 2020-09-17 DIAGNOSIS — C9110 Chronic lymphocytic leukemia of B-cell type not having achieved remission: Secondary | ICD-10-CM | POA: Diagnosis not present

## 2020-09-17 HISTORY — DX: Bitten or stung by nonvenomous insect and other nonvenomous arthropods, initial encounter: W57.XXXA

## 2020-09-17 HISTORY — DX: Myalgia, unspecified site: M79.10

## 2020-09-17 MED ORDER — MINOCYCLINE HCL 100 MG PO CAPS: 100.0000 mg | ORAL_CAPSULE | Freq: Two times a day (BID) | ORAL | 0 refills | Status: DC

## 2020-09-17 NOTE — Progress Notes (Signed)
Subjective:   Chief Complaint: Tick bite in his upper back followed by myalgias malaise prickly pain in his knees and some nausea   Patient ID: David Whitehead, male    DOB: 1970-11-25, 50 y.o.   MRN: [id number]  HPI   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  He underwent VP shunt placement by Dr. Cyndy Freeze while in the hospital for control of his high intracranial pressures.  He completed nearly a month of amphotericin with flucytosine.  Towards the end of his month of therapy he did develop a drug rash and acute kidney injury both attributed to the amphotericin.  He was given prednisone for 3 days and still had the rash at discharge.  He had follow-up labs as an outpatient with his primary care physician which showed his creatinine was still around the same range as it was when he was discharged from the hospital.  He thought the rash was getting worse but actually since he was seen by the PCP it is actually been improving.  Per Dr. Baxter Whitehead the rash was largely on his flank and torso but extended to his extremities when it was at its worse.  The patient confirmed this and showed me that the rash had disappeared from his lower extremities and his back.  It also dissappeared from his torso.  T  I last saw him in clinic in early November 2020 and he was having result resolution of this rash.  He is currently on venotoclax with Dr. Marin Olp for his CLL.  He has no headaches or symptoms that would be concerning for recurrence of his cryptococcal meningitis.  He did have a recent outbreak of zoster we will visit to go.  He been feeling relatively well recently though he does suffer from fatigue which he attributes to his    Headaches or other systemic symptoms to suggest recurrence of infection venotoclax.  He pulled a fairly engorged tick off of his back on Friday.  He did not know how long it had been  attached it was a deer tick apparently.  He has subsequently developed worsening fatigue as well as myalgias particularly in his knees also some low-grade nausea.  He is concerned about potentially having a tickborne infection.  Also discussed the idea of coming off the fluconazole since he has had adequate treatment based on guidelines but he wants to stay on it for an additional 6 months. Past Medical History:  Diagnosis Date  . Cancer ([place])   . Chronic headache   . CLL (chronic lymphocytic leukemia) ([place])   . Diplopia   . Edema 03/14/2019  . Hypertension   . Leukocytosis 04/01/2018  . Lower extremity edema 03/14/2019  . Molluscum contagiosum 10/26/2019  . Sleep apnea    uses a cpap    Past Surgical History:  Procedure Laterality Date  . AXILLARY LYMPH NODE DISSECTION Right 04/16/2018   Procedure: AXILLARY LYMPH NODE DEEP EXCISION;  Surgeon: Fanny Skates, MD;  Location: [place];  Service: General;  Laterality: Right;  . COLONOSCOPY N/A 04/01/2019   Procedure: COLONOSCOPY;  Surgeon: Danie Binder, MD;  [facility];  Service: Endoscopy;  Laterality: N/A;  12:45pm  . ESOPHAGOGASTRODUODENOSCOPY N/A 04/01/2019   Procedure: ESOPHAGOGASTRODUODENOSCOPY (EGD);  Surgeon: Danie Binder, MD;  [facility];  Service: Endoscopy;  Laterality: N/A;  . FINGER SURGERY    . VENTRICULOPERITONEAL SHUNT Right 02/18/2019   Procedure:  SHUNT INSERTION VENTRICULAR-PERITONEAL;  Surgeon: Ashok Pall, MD;  Location: [place];  Service: Neurosurgery;  Laterality: Right;  SHUNT INSERTION VENTRICULAR-PERITONEAL    Family History  Problem Relation Age of Onset  . Colon cancer Father 70       passed from [place]  . Colon cancer Paternal Uncle 73       passed from [facility]      Social History   Socioeconomic History  . Marital status: Married    Spouse name: David Whitehead  . Number of children: 0  . Years of education: 11  . Highest education level: Not on file  Occupational History  . Not on  file  Tobacco Use  . Smoking status: Never Smoker  . Smokeless tobacco: Never Used  Vaping Use  . Vaping Use: Never used  Substance and Sexual Activity  . Alcohol use: Not Currently  . Drug use: Never  . Sexual activity: Yes  Other Topics Concern  . Not on file  Social History Narrative   Right handed    Lives with wife, David Whitehead   Caffeine use: rare   Social Determinants of Health   Financial Resource Strain: Not on file  Food Insecurity: Not on file  Transportation Needs: Not on file  Physical Activity: Not on file  Stress: Not on file  Social Connections: Not on file    Allergies  Allergen Reactions  . Bactrim [Sulfamethoxazole-Trimethoprim] Rash  . Doxycycline Calcium Itching    "felt like pins and needles were in face after one dose."  . Latex Other (See Comments)    Took skin off when removed tape  . Allopurinol Itching and Rash  . Amoxicillin Rash    Has patient had a PCN reaction causing immediate rash, facial/tongue/throat swelling, SOB or lightheadedness with hypotension: No Has patient had a PCN reaction causing severe rash involving mucus membranes or skin necrosis: No Has patient had a PCN reaction that required hospitalization: No Has patient had a PCN reaction occurring within the last 10 years: No If all of the above answers are "NO", then may proceed with Cephalosporin use.      Current Outpatient Medications:  .  acyclovir (ZOVIRAX) 800 MG tablet, Take 1 tablet (800 mg total) by mouth 5 (five) times daily. (Patient taking differently: Take 800 mg by mouth 2 (two) times daily.), Disp: 70 tablet, Rfl: 1 .  azithromycin (ZITHROMAX Z-PAK) 250 MG tablet, Take as directed, Disp: 6 each, Rfl: 0 .  cetirizine (ZYRTEC) 10 MG tablet, Take 10 mg by mouth daily., Disp: , Rfl:  .  fluconazole (DIFLUCAN) 200 MG tablet, Take 1 tablet (200 mg total) by mouth daily., Disp: 30 tablet, Rfl: 10 .  Prenatal Vit-Fe Fumarate-FA (PRENATAL MULTIVITAMIN) TABS tablet, Take 1  tablet by mouth daily at 12 noon., Disp: , Rfl:  .  venetoclax (VENCLEXTA) 100 MG tablet, TAKE 1 TABLET (100 MG) BY MOUTH DAILY., Disp: 30 tablet, Rfl: 6   Review of Systems  Constitutional: Positive for fatigue. Negative for activity change, appetite change, chills, diaphoresis, fever and unexpected weight change.  HENT: Negative for congestion, rhinorrhea, sinus pressure, sneezing, sore throat and trouble swallowing.   Eyes: Negative for photophobia and visual disturbance.  Respiratory: Negative for cough, chest tightness, shortness of breath, wheezing and stridor.   Cardiovascular: Negative for palpitations and leg swelling.  Gastrointestinal: Positive for nausea. Negative for abdominal distention, abdominal pain, anal bleeding, blood in stool, constipation, diarrhea and vomiting.  Genitourinary: Negative for difficulty urinating, dysuria, flank pain  and hematuria.  Musculoskeletal: Positive for myalgias. Negative for arthralgias, gait problem and joint swelling.  Skin: Negative for color change, pallor, rash and wound.  Neurological: Negative for dizziness, tremors, weakness and light-headedness.  Hematological: Negative for adenopathy. Does not bruise/bleed easily.  Psychiatric/Behavioral: Negative for agitation, behavioral problems, confusion, decreased concentration, dysphoric mood and sleep disturbance.       Objective:   Physical Exam Constitutional:      General: He is not in acute distress.    Appearance: Normal appearance. He is well-developed. He is not ill-appearing or diaphoretic.  HENT:     Head: Normocephalic and atraumatic.     Right Ear: Hearing and external ear normal.     Left Ear: Hearing and external ear normal.     Nose: No nasal deformity or rhinorrhea.  Eyes:     General: No scleral icterus.    Extraocular Movements: Extraocular movements intact.     Conjunctiva/sclera: Conjunctivae normal.     Right eye: Right conjunctiva is not injected.     Left eye:  Left conjunctiva is not injected.  Neck:     Vascular: No JVD.  Cardiovascular:     Rate and Rhythm: Normal rate and regular rhythm.     Heart sounds: S1 normal and S2 normal.  Abdominal:     General: There is no distension.     Palpations: Abdomen is soft.  Musculoskeletal:        General: Normal range of motion.     Right shoulder: Normal.     Left shoulder: Normal.     Cervical back: Normal range of motion and neck supple.     Right hip: Normal.     Left hip: Normal.     Right knee: Normal.     Left knee: Normal.  Lymphadenopathy:     Head:     Right side of head: No submandibular, preauricular or posterior auricular adenopathy.     Left side of head: No submandibular, preauricular or posterior auricular adenopathy.     Cervical: No cervical adenopathy.     Right cervical: No superficial or deep cervical adenopathy.    Left cervical: No superficial or deep cervical adenopathy.  Skin:    General: Skin is warm and dry.     Coloration: Skin is not pale.     Findings: No abrasion, bruising, ecchymosis, erythema or lesion.     Nails: There is no clubbing.  Neurological:     Mental Status: He is alert and oriented to person, place, and time.     Sensory: No sensory deficit.     Coordination: Coordination normal.     Gait: Gait normal.  Psychiatric:        Attention and Perception: He is attentive.        Mood and Affect: Mood normal.        Speech: Speech normal.        Behavior: Behavior normal. Behavior is cooperative.        Thought Content: Thought content normal.        Judgment: Judgment normal.      Tick bite site 09/17/2020:           Assessment & Plan:   Recent tick bite with some symptoms of malaise myalgias and nausea: Certainly could be early tickborne infection with [facility] spotted fever or ehrlichiosis.  He does not have evidence of erythema migrans rash that 1 was seen with Lyme.  I was going to give  him doxycycline but he had some intense  burning in his face the last time he was on doxycycline.  Inquired if this happened with sun exposure but did not remember specific sun exposure.  I therefore then proposed trying minocycline which is also been used to treat it.  Hopefully he tolerates that I would not have to resort to a FQ.  Check acute titers for RMSF Ehrlichia and Borrelia burgdorferi I will I do not have much suspicion for the latter.  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   CLL following up with oncology on effective rx   I spent greater than 40 minutes with the patient including greater than 50% of time in face to face counsel of the patient during his cryptococcal meningitis, regarding his CLL regarding tickborne infections or treatment and prevention and in coordination of his care.

## 2020-09-18 ENCOUNTER — Telehealth: Payer: Self-pay

## 2020-09-18 NOTE — Telephone Encounter (Signed)
-----   Message from Truman Hayward, MD sent at 09/18/2020 10:52 AM EDT ----- Labs not especially suggestive of RMSF or ehrlchia

## 2020-09-18 NOTE — Telephone Encounter (Signed)
RN spoke to patient to relay per Dr. Tommy Medal that his labs are not especially suggestive of [facility] Spotted Fever or ehrlichia. Reminded patient of upcoming appointment. Patient verbalized understanding and has no further questions.   Beryle Flock, RN

## 2020-09-20 LAB — CBC WITH DIFFERENTIAL/PLATELET
Absolute Monocytes: 788 cells/uL (ref 200–950)
Basophils Absolute: 140 cells/uL (ref 0–200)
Basophils Relative: 1.8 %
Eosinophils Absolute: 819 cells/uL — ABNORMAL HIGH (ref 15–500)
Eosinophils Relative: 10.5 %
HCT: 46.9 % (ref 38.5–50.0)
Hemoglobin: 15 g/dL (ref 13.2–17.1)
Lymphs Abs: 2012 cells/uL (ref 850–3900)
MCH: 25.3 pg — ABNORMAL LOW (ref 27.0–33.0)
MCHC: 32 g/dL (ref 32.0–36.0)
MCV: 79.1 fL — ABNORMAL LOW (ref 80.0–100.0)
MPV: 9.4 fL (ref 7.5–12.5)
Monocytes Relative: 10.1 %
Neutro Abs: 4040 cells/uL (ref 1500–7800)
Neutrophils Relative %: 51.8 %
Platelets: 171 10*3/uL (ref 140–400)
RBC: 5.93 10*6/uL — ABNORMAL HIGH (ref 4.20–5.80)
RDW: 15.2 % — ABNORMAL HIGH (ref 11.0–15.0)
Total Lymphocyte: 25.8 %
WBC: 7.8 10*3/uL (ref 3.8–10.8)

## 2020-09-20 LAB — COMPLETE METABOLIC PANEL WITH GFR
AG Ratio: 2.8 (calc) — ABNORMAL HIGH (ref 1.0–2.5)
ALT: 57 U/L — ABNORMAL HIGH (ref 9–46)
AST: 25 U/L (ref 10–40)
Albumin: 4.4 g/dL (ref 3.6–5.1)
Alkaline phosphatase (APISO): 56 U/L (ref 36–130)
BUN: 18 mg/dL (ref 7–25)
CO2: 26 mmol/L (ref 20–32)
Calcium: 9 mg/dL (ref 8.6–10.3)
Chloride: 107 mmol/L (ref 98–110)
Creat: 1 mg/dL (ref 0.60–1.35)
GFR, Est African American: 103 mL/min/{1.73_m2} (ref >=60)
GFR, Est Non African American: 89 mL/min/{1.73_m2} (ref >=60)
Globulin: 1.6 g/dL (calc) — ABNORMAL LOW (ref 1.9–3.7)
Glucose, Bld: 95 mg/dL (ref 65–99)
Potassium: 4.2 mmol/L (ref 3.5–5.3)
Sodium: 141 mmol/L (ref 135–146)
Total Bilirubin: 0.4 mg/dL (ref 0.2–1.2)
Total Protein: 6 g/dL — ABNORMAL LOW (ref 6.1–8.1)

## 2020-09-20 LAB — EHRLICHIA ANTIBODY PANEL
E. CHAFFEENSIS AB IGG: <1:64 {titer}
E. CHAFFEENSIS AB IGM: <1:20 {titer}

## 2020-09-20 LAB — ROCKY MTN SPOTTED FVR ABS PNL(IGG+IGM)
RMSF IgG: NOT DETECTED
RMSF IgM: NOT DETECTED

## 2020-09-20 LAB — B. BURGDORFI ANTIBODIES: B burgdorferi Ab IgG+IgM: <0.9 index

## 2020-09-21 ENCOUNTER — Encounter: Payer: Self-pay | Admitting: *Deleted

## 2020-09-21 ENCOUNTER — Other Ambulatory Visit: Payer: Self-pay | Admitting: *Deleted

## 2020-09-21 ENCOUNTER — Other Ambulatory Visit (HOSPITAL_COMMUNITY): Payer: Self-pay

## 2020-09-21 DIAGNOSIS — C9110 Chronic lymphocytic leukemia of B-cell type not having achieved remission: Secondary | ICD-10-CM

## 2020-09-21 MED ORDER — ACYCLOVIR 400 MG PO TABS: 400.0000 mg | ORAL_TABLET | Freq: Two times a day (BID) | ORAL | 1 refills | Status: DC

## 2020-09-21 MED FILL — Venetoclax Tab 100 MG: ORAL | 30 days supply | Qty: 30 | Fill #1 | Status: AC

## 2020-09-27 ENCOUNTER — Other Ambulatory Visit (HOSPITAL_COMMUNITY): Payer: Self-pay

## 2020-10-03 ENCOUNTER — Other Ambulatory Visit: Payer: Self-pay

## 2020-10-03 ENCOUNTER — Ambulatory Visit (INDEPENDENT_AMBULATORY_CARE_PROVIDER_SITE_OTHER): Payer: BC Managed Care – PPO | Admitting: Infectious Disease

## 2020-10-03 ENCOUNTER — Encounter: Payer: Self-pay | Admitting: Infectious Disease

## 2020-10-03 VITALS — BP 126/90 | HR 91 | Resp 16 | Ht 70.0 in | Wt 245.0 lb

## 2020-10-03 DIAGNOSIS — B451 Cerebral cryptococcosis: Secondary | ICD-10-CM

## 2020-10-03 DIAGNOSIS — Z7185 Encounter for immunization safety counseling: Secondary | ICD-10-CM | POA: Diagnosis not present

## 2020-10-03 DIAGNOSIS — S1096XA Insect bite of unspecified part of neck, initial encounter: Secondary | ICD-10-CM

## 2020-10-03 DIAGNOSIS — W57XXXA Bitten or stung by nonvenomous insect and other nonvenomous arthropods, initial encounter: Secondary | ICD-10-CM

## 2020-10-03 DIAGNOSIS — C9110 Chronic lymphocytic leukemia of B-cell type not having achieved remission: Secondary | ICD-10-CM | POA: Diagnosis not present

## 2020-10-03 NOTE — Progress Notes (Signed)
Subjective:   Chief Complaint: Follow-up for tick bite   David Whitehead ID: David Whitehead, male    DOB: 1970-09-04, 50 y.o.   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  David Whitehead underwent VP shunt placement by Dr. Cyndy Freeze while in the hospital for control of his high intracranial pressures.  David Whitehead completed nearly a month of amphotericin with flucytosine.  Towards the end of his month of therapy David Whitehead did develop a drug rash and acute kidney injury both attributed to the amphotericin.  David Whitehead was given prednisone for 3 days and still had the rash at discharge.  David Whitehead had follow-up labs as an outpatient with his primary care physician which showed his creatinine was still around the same range as it was when David Whitehead was discharged from the hospital.  David Whitehead thought the rash was getting worse but actually since David Whitehead was seen by the PCP it is actually been improving.  Per Dr. [name] the rash was largely on his flank and torso but extended to his extremities when it was at its worse.  The David Whitehead confirmed this and showed me that the rash had disappeared from his lower extremities and his back.  It also dissappeared from his torso.  T  I last saw him in clinic in early November 2020 and David Whitehead was having result resolution of this rash.  David Whitehead is currently on venotoclax with Dr. Marin Olp for his CLL.  David Whitehead has no headaches or symptoms that would be concerning for recurrence of his cryptococcal meningitis.  David Whitehead did have a recent outbreak of zoster several visits ago  David Whitehead been feeling relatively well recently though David Whitehead does suffer from fatigue which David Whitehead attributes to his  David Whitehead pulled a fairly engorged tick off of his back on Friday before his last visit with m.  David Whitehead did not know how long it had been attached it was a deer tick apparently.  David Whitehead has subsequently developed worsening fatigue as well as myalgias particularly in his knees also  some low-grade nausea.  David Whitehead was concerned about potentially having a tickborne infection.  Also discussed the idea of coming off the fluconazole since David Whitehead has had adequate treatment based on guidelines but David Whitehead wants to stay on it for an additional 6 months.  Regarding the tick bite we did check some labs none of which were suggestive of Ehrlichia or RMSF.  David Whitehead has baseline acute titers were negative for Borrelia Ehrlichia and RMSF.  David Whitehead says David Whitehead felt better on the minocycline but then as David Whitehead finished it again began feeling fatigued.  David Whitehead has been frustrated that David Whitehead does not feel back to normal despite his hematological abnormalities being more normal than they were in the past.  David Whitehead is trying to gauge the risk of going to I am asked the year to watch a movie this weekend.  David Whitehead asked me if it will be safe for him to go.  I asked about his vaccine status David Whitehead has had 3 full dose Moderna vaccines in one of the end of April.  David Whitehead did have quite a bit of gout-like symptoms after this vaccine which is encouraging to me that it may have helped him mount an immune response.    Past Medical History:  Diagnosis Date  . Cancer ([place])   . Chronic headache   . CLL (chronic lymphocytic leukemia) ([place])   . Diplopia   . Edema  03/14/2019  . Hypertension   . Leukocytosis 04/01/2018  . Lower extremity edema 03/14/2019  . Molluscum contagiosum 10/26/2019  . Myalgia 09/17/2020  . Sleep apnea    uses a cpap  . Tick bite 09/17/2020    Past Surgical History:  Procedure Laterality Date  . AXILLARY LYMPH NODE DISSECTION Right 04/16/2018   Procedure: AXILLARY LYMPH NODE DEEP EXCISION;  Surgeon: Fanny Skates, MD;  Location: [place];  Service: General;  Laterality: Right;  . COLONOSCOPY N/A 04/01/2019   Procedure: COLONOSCOPY;  Surgeon: Danie Binder, MD;  [facility];  Service: Endoscopy;  Laterality: N/A;  12:45pm  . ESOPHAGOGASTRODUODENOSCOPY N/A 04/01/2019   Procedure: ESOPHAGOGASTRODUODENOSCOPY (EGD);   Surgeon: Danie Binder, MD;  [facility];  Service: Endoscopy;  Laterality: N/A;  . FINGER SURGERY    . VENTRICULOPERITONEAL SHUNT Right 02/18/2019   Procedure: SHUNT INSERTION VENTRICULAR-PERITONEAL;  Surgeon: Ashok Pall, MD;  Location: [place];  Service: Neurosurgery;  Laterality: Right;  SHUNT INSERTION VENTRICULAR-PERITONEAL    Family History  Problem Relation Age of Onset  . Colon cancer Father 49       passed from [place]  . Colon cancer Paternal Uncle 13       passed from [facility]      Social History   Socioeconomic History  . Marital status: Married    Spouse name: [name]  . Number of children: 0  . Years of education: 40  . Highest education level: Not on file  Occupational History  . Not on file  Tobacco Use  . Smoking status: Never Smoker  . Smokeless tobacco: Never Used  Vaping Use  . Vaping Use: Never used  Substance and Sexual Activity  . Alcohol use: Not Currently  . Drug use: Never  . Sexual activity: Yes  Other Topics Concern  . Not on file  Social History Narrative   Right handed    Lives with wife, [name]   Caffeine use: rare   Social Determinants of Health   Financial Resource Strain: Not on file  Food Insecurity: Not on file  Transportation Needs: Not on file  Physical Activity: Not on file  Stress: Not on file  Social Connections: Not on file    Allergies  Allergen Reactions  . Bactrim [Sulfamethoxazole-Trimethoprim] Rash  . Doxycycline Calcium Itching    "felt like pins and needles were in face after one dose."  . Latex Other (See Comments)    Took skin off when removed tape  . Allopurinol Itching and Rash  . Amoxicillin Rash    Has David Whitehead had a PCN reaction causing immediate rash, facial/tongue/throat swelling, SOB or lightheadedness with hypotension: No Has David Whitehead had a PCN reaction causing severe rash involving mucus membranes or skin necrosis: No Has David Whitehead had a PCN reaction that required hospitalization: No Has David Whitehead  had a PCN reaction occurring within the last 10 years: No If all of the above answers are "NO", then may proceed with Cephalosporin use.      Current Outpatient Medications:  .  acyclovir (ZOVIRAX) 400 MG tablet, Take 1 tablet (400 mg total) by mouth 2 (two) times daily., Disp: 60 tablet, Rfl: 1 .  cetirizine (ZYRTEC) 10 MG tablet, Take 10 mg by mouth daily., Disp: , Rfl:  .  fluconazole (DIFLUCAN) 200 MG tablet, Take 1 tablet (200 mg total) by mouth daily., Disp: 30 tablet, Rfl: 10 .  fluconazole (DIFLUCAN) 200 MG tablet, Take 200 mg by mouth daily., Disp: , Rfl:  .  minocycline (MINOCIN) 100 MG capsule, Take 1 capsule (100 mg total) by mouth 2 (two) times daily., Disp: 20 capsule, Rfl: 0 .  Prenatal Vit-Fe Fumarate-FA (PRENATAL MULTIVITAMIN) TABS tablet, Take 1 tablet by mouth daily at 12 noon., Disp: , Rfl:  .  venetoclax (VENCLEXTA) 100 MG tablet, TAKE 1 TABLET (100 MG) BY MOUTH DAILY., Disp: 30 tablet, Rfl: 6   Review of Systems  Constitutional: Positive for fatigue. Negative for activity change, appetite change, chills, diaphoresis, fever and unexpected weight change.  HENT: Negative for congestion, rhinorrhea, sinus pressure, sneezing, sore throat and trouble swallowing.   Eyes: Negative for photophobia and visual disturbance.  Respiratory: Negative for cough, chest tightness, shortness of breath, wheezing and stridor.   Cardiovascular: Negative for palpitations and leg swelling.  Gastrointestinal: Negative for abdominal distention, abdominal pain, anal bleeding, blood in stool, constipation, diarrhea, nausea and vomiting.  Genitourinary: Negative for difficulty urinating, dysuria, flank pain and hematuria.  Musculoskeletal: Negative for arthralgias, gait problem, joint swelling and myalgias.  Skin: Negative for color change, pallor, rash and wound.  Neurological: Negative for dizziness, tremors, weakness and light-headedness.  Hematological: Negative for adenopathy. Does not  bruise/bleed easily.  Psychiatric/Behavioral: Negative for agitation, behavioral problems, confusion, decreased concentration, dysphoric mood and sleep disturbance.       Objective:   Physical Exam Constitutional:      General: David Whitehead is not in acute distress.    Appearance: Normal appearance. David Whitehead is well-developed. David Whitehead is not ill-appearing or diaphoretic.  HENT:     Head: Normocephalic and atraumatic.     Right Ear: Hearing and external ear normal.     Left Ear: Hearing and external ear normal.     Nose: No nasal deformity or rhinorrhea.  Eyes:     General: No scleral icterus.    Extraocular Movements: Extraocular movements intact.     Conjunctiva/sclera: Conjunctivae normal.     Right eye: Right conjunctiva is not injected.     Left eye: Left conjunctiva is not injected.  Neck:     Vascular: No JVD.  Cardiovascular:     Rate and Rhythm: Normal rate and regular rhythm.     Heart sounds: S1 normal and S2 normal.  Pulmonary:     Effort: No respiratory distress.  Abdominal:     General: There is no distension.     Palpations: Abdomen is soft.  Musculoskeletal:        General: Normal range of motion.     Right shoulder: Normal.     Left shoulder: Normal.     Cervical back: Normal range of motion and neck supple.     Right hip: Normal.     Left hip: Normal.     Right knee: Normal.     Left knee: Normal.  Lymphadenopathy:     Head:     Right side of head: No submandibular, preauricular or posterior auricular adenopathy.     Left side of head: No submandibular, preauricular or posterior auricular adenopathy.     Cervical: No cervical adenopathy.     Right cervical: No superficial or deep cervical adenopathy.    Left cervical: No superficial or deep cervical adenopathy.  Skin:    General: Skin is warm and dry.     Coloration: Skin is not pale.     Findings: No abrasion, bruising, ecchymosis, erythema or lesion.     Nails: There is no clubbing.  Neurological:     Mental Status:  David Whitehead is alert and oriented to  person, place, and time.     Sensory: No sensory deficit.     Coordination: Coordination normal.     Gait: Gait normal.  Psychiatric:        Attention and Perception: David Whitehead is attentive.        Mood and Affect: Mood normal.        Speech: Speech normal.        Behavior: Behavior normal. Behavior is cooperative.        Thought Content: Thought content normal.        Judgment: Judgment normal.      Tick bite site 09/17/2020:           Assessment & Plan:   Recent tick bite with some symptoms of malaise myalgias and nausea: Certainly could be early tickborne infection with [facility] spotted fever or ehrlichiosis.  David Whitehead does not have evidence of erythema migrans rash that 1 was seen with Lyme.  Labs were not suggestive of RMSF Ehrlichia and acute titers were negative for all 3 tickborne infections that we tested for.  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   CLL following up with oncology on effective rx  COVID prevention: I will check quantitative GG for COVID-19 have encouraged him to wear a mask if David Whitehead goes to the movie theater and David Whitehead will try to distance himself from others as well.   I spent greater than 40 minutes with the David Whitehead including greater than 50% of time in face to face counsel of the David Whitehead regarding tick borne infections, cryptococcus, his CML COVID prevention, reviewing his radiographs, his labs and microbiological data  and in coordination of his care.

## 2020-10-05 ENCOUNTER — Ambulatory Visit: Payer: BC Managed Care – PPO | Admitting: Infectious Disease

## 2020-10-10 LAB — ROCKY MTN SPOTTED FVR ABS PNL(IGG+IGM)
RMSF IgG: NOT DETECTED
RMSF IgM: NOT DETECTED

## 2020-10-10 LAB — EHRLICHIA ANTIBODY PANEL
E. CHAFFEENSIS AB IGG: <1:64 {titer}
E. CHAFFEENSIS AB IGM: <1:20 {titer}

## 2020-10-10 LAB — SARS-COV-2 ANTIBODY(IGG)SPIKE,SEMI-QUANTITATIVE: SARS COV1 AB(IGG)SPIKE,SEMI QN: 16.39 index — ABNORMAL HIGH (ref <1.00)

## 2020-10-10 LAB — B. BURGDORFI ANTIBODIES: B burgdorferi Ab IgG+IgM: <0.9 index

## 2020-10-23 ENCOUNTER — Other Ambulatory Visit (HOSPITAL_COMMUNITY): Payer: Self-pay

## 2020-10-29 ENCOUNTER — Other Ambulatory Visit (HOSPITAL_COMMUNITY): Payer: Self-pay

## 2020-10-29 MED FILL — Venetoclax Tab 100 MG: ORAL | 30 days supply | Qty: 30 | Fill #2 | Status: AC

## 2020-11-06 ENCOUNTER — Encounter: Payer: Self-pay | Admitting: Internal Medicine

## 2020-11-21 ENCOUNTER — Other Ambulatory Visit: Payer: Self-pay

## 2020-11-21 ENCOUNTER — Telehealth: Payer: Self-pay

## 2020-11-21 ENCOUNTER — Inpatient Hospital Stay: Payer: BC Managed Care – PPO | Attending: Hematology

## 2020-11-21 ENCOUNTER — Other Ambulatory Visit: Payer: Self-pay | Admitting: Family

## 2020-11-21 ENCOUNTER — Encounter: Payer: Self-pay | Admitting: Hematology & Oncology

## 2020-11-21 ENCOUNTER — Encounter: Payer: Self-pay | Admitting: *Deleted

## 2020-11-21 ENCOUNTER — Inpatient Hospital Stay (HOSPITAL_BASED_OUTPATIENT_CLINIC_OR_DEPARTMENT_OTHER): Payer: BC Managed Care – PPO | Admitting: Hematology & Oncology

## 2020-11-21 VITALS — BP 146/97 | HR 66 | Temp 98.2°F | Resp 16 | Wt 240.0 lb

## 2020-11-21 DIAGNOSIS — B451 Cerebral cryptococcosis: Secondary | ICD-10-CM | POA: Insufficient documentation

## 2020-11-21 DIAGNOSIS — C9110 Chronic lymphocytic leukemia of B-cell type not having achieved remission: Secondary | ICD-10-CM | POA: Diagnosis not present

## 2020-11-21 LAB — CBC WITH DIFFERENTIAL (CANCER CENTER ONLY)
Abs Immature Granulocytes: 0.02 10*3/uL (ref 0.00–0.07)
Basophils Absolute: 0 10*3/uL (ref 0.0–0.1)
Basophils Relative: 1 %
Eosinophils Absolute: 0.2 10*3/uL (ref 0.0–0.5)
Eosinophils Relative: 3 %
HCT: 45.9 % (ref 39.0–52.0)
Hemoglobin: 15 g/dL (ref 13.0–17.0)
Immature Granulocytes: 0 %
Lymphocytes Relative: 26 %
Lymphs Abs: 1.5 10*3/uL (ref 0.7–4.0)
MCH: 27.1 pg (ref 26.0–34.0)
MCHC: 32.7 g/dL (ref 30.0–36.0)
MCV: 82.9 fL (ref 80.0–100.0)
Monocytes Absolute: 0.4 10*3/uL (ref 0.1–1.0)
Monocytes Relative: 8 %
Neutro Abs: 3.5 10*3/uL (ref 1.7–7.7)
Neutrophils Relative %: 62 %
Platelet Count: 142 10*3/uL — ABNORMAL LOW (ref 150–400)
RBC: 5.54 MIL/uL (ref 4.22–5.81)
RDW: 13.7 % (ref 11.5–15.5)
WBC Count: 5.7 10*3/uL (ref 4.0–10.5)
nRBC: 0 % (ref 0.0–0.2)

## 2020-11-21 LAB — CMP (CANCER CENTER ONLY)
ALT: 66 U/L — ABNORMAL HIGH (ref 0–44)
AST: 31 U/L (ref 15–41)
Albumin: 4.5 g/dL (ref 3.5–5.0)
Alkaline Phosphatase: 54 U/L (ref 38–126)
Anion gap: 5 (ref 5–15)
BUN: 15 mg/dL (ref 6–20)
CO2: 28 mmol/L (ref 22–32)
Calcium: 9.7 mg/dL (ref 8.9–10.3)
Chloride: 108 mmol/L (ref 98–111)
Creatinine: 1.08 mg/dL (ref 0.61–1.24)
GFR, Estimated: >60 mL/min (ref >60)
Glucose, Bld: 108 mg/dL — ABNORMAL HIGH (ref 70–99)
Potassium: 3.9 mmol/L (ref 3.5–5.1)
Sodium: 141 mmol/L (ref 135–145)
Total Bilirubin: 0.4 mg/dL (ref 0.3–1.2)
Total Protein: 6 g/dL — ABNORMAL LOW (ref 6.5–8.1)

## 2020-11-21 LAB — SAVE SMEAR(SSMR), FOR PROVIDER SLIDE REVIEW

## 2020-11-21 LAB — LACTATE DEHYDROGENASE: LDH: 191 U/L (ref 98–192)

## 2020-11-21 NOTE — Progress Notes (Signed)
Hematology and Oncology Follow Up Visit  David Whitehead [id number] 05/11/1971 50 y.o. 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 daily       Venetoclax 100 mg po q day -- start on 01/24/2020 -     Interim History:  David Whitehead is back for follow-up.  He is doing fantastic.  He is really had no problems since we last saw him.  He has had no issues with the venetoclax.  He still on the Diflucan.  He has had no problems with rashes.  There is no nausea or vomiting.  He has had no change in bowel or bladder habits.  He has had no issues with COVID.  He has had no bleeding.  His last IgG level was 500 mg/dL.  We will have to watch out for this.  I still do not think that we have to do any supplemental IVIG.  Currently, his performance status is ECOG 0.     Medications:  Current Outpatient Medications:    acyclovir (ZOVIRAX) 400 MG tablet, Take 1 tablet (400 mg total) by mouth 2 (two) times daily., Disp: 60 tablet, Rfl: 1   cetirizine (ZYRTEC) 10 MG tablet, Take 10 mg by mouth daily., Disp: , Rfl:    fluconazole (DIFLUCAN) 200 MG tablet, Take 1 tablet (200 mg total) by mouth daily., Disp: 30 tablet, Rfl: 10   fluconazole (DIFLUCAN) 200 MG tablet, Take 200 mg by mouth daily., Disp: , Rfl:    minocycline (MINOCIN) 100 MG capsule, Take 1 capsule (100 mg total) by mouth 2 (two) times daily., Disp: 20 capsule, Rfl: 0   Prenatal Vit-Fe Fumarate-FA (PRENATAL MULTIVITAMIN) TABS tablet, Take 1 tablet by mouth daily at 12 noon., Disp: , Rfl:    venetoclax (VENCLEXTA) 100 MG tablet, TAKE 1 TABLET (100 MG) BY MOUTH DAILY., Disp: 30 tablet, Rfl: 6  Allergies:  Allergies  Allergen Reactions   Bactrim [Sulfamethoxazole-Trimethoprim] Rash   Doxycycline Calcium Itching    "felt like pins and needles were in face after one dose."   Latex Other (See Comments)    Took skin off when removed  tape   Allopurinol Itching and Rash   Amoxicillin Rash    Has patient had a PCN reaction causing immediate rash, facial/tongue/throat swelling, SOB or lightheadedness with hypotension: No Has patient had a PCN reaction causing severe rash involving mucus membranes or skin necrosis: No Has patient had a PCN reaction that required hospitalization: No Has patient had a PCN reaction occurring within the last 10 years: No If all of the above answers are "NO", then may proceed with Cephalosporin use.     Past Medical History, Surgical history, Social history, and Family History were reviewed and updated.  Review of Systems: Review of Systems  Constitutional: Negative.   HENT:  Negative.    Eyes: Negative.   Respiratory: Negative.    Cardiovascular: Negative.   Gastrointestinal: Negative.   Endocrine: Negative.   Genitourinary: Negative.    Musculoskeletal: Negative.   Skin: Negative.   Neurological: Negative.   Hematological: Negative.   Psychiatric/Behavioral: Negative.     Physical Exam:  weight is 240 lb (108.9 kg). His oral temperature is 98.2 F (36.8 C). His blood pressure is 146/97 (abnormal) and his pulse is 66. His respiration is 16 and oxygen saturation is 100%.   Wt Readings from Last 3 Encounters:  11/21/20 240 lb (108.9 kg)  10/03/20 245 lb (111.1 kg)  09/17/20 245 lb (111.1 kg)    Physical Exam Vitals reviewed.  HENT:     Head: Normocephalic and atraumatic.  Eyes:     Pupils: Pupils are equal, round, and reactive to light.  Neck:     Comments: On his neck exam, I really cannot palpate any adenopathy at this point.  There is no cervical or supraclavicular lymph nodes.  Cardiovascular:     Rate and Rhythm: Normal rate and regular rhythm.     Heart sounds: Normal heart sounds.  Pulmonary:     Effort: Pulmonary effort is normal.     Breath sounds: Normal breath sounds.  Abdominal:     General: Bowel sounds are normal.     Palpations: Abdomen is soft.   Musculoskeletal:        General: No tenderness or deformity. Normal range of motion.     Cervical back: Normal range of motion.  Lymphadenopathy:     Cervical: No cervical adenopathy.  Skin:    General: Skin is warm and dry.     Findings: No erythema or rash.  Neurological:     Mental Status: He is alert and oriented to person, place, and time.  Psychiatric:        Behavior: Behavior normal.        Thought Content: Thought content normal.        Judgment: Judgment normal.     Lab Results  Component Value Date   WBC 5.7 11/21/2020   HGB 15.0 11/21/2020   HCT 45.9 11/21/2020   MCV 82.9 11/21/2020   PLT 142 (L) 11/21/2020     Chemistry      Component Value Date/Time   NA 141 11/21/2020 1137   NA 140 03/08/2019 1438   K 3.9 11/21/2020 1137   CL 108 11/21/2020 1137   CO2 28 11/21/2020 1137   BUN 15 11/21/2020 1137   BUN 25 (H) 03/08/2019 1438   CREATININE 1.08 11/21/2020 1137   CREATININE 1.00 09/17/2020 1400      Component Value Date/Time   CALCIUM 9.7 11/21/2020 1137   ALKPHOS 54 11/21/2020 1137   AST 31 11/21/2020 1137   ALT 66 (H) 11/21/2020 1137   BILITOT 0.4 11/21/2020 1137      Impression and Plan:  David Whitehead is a very nice 50 year old white male.  He has CLL.  I must say that he certainly is quite young for CLL.  He clearly has an aggressive CLL by virtue of the cytogenetics.  We will continue on the low-dose venetoclax.  I think this is working nicely for him.    I probably will keep him on the venetoclax for another year.  I would then have to do a bone marrow biopsy to see if there is any minimal residual disease.  If there is none, the maybe we can stop the venetoclax.  We will now get him through the summertime.  I will get him back in 4 months.  I think this would be very reasonable since he is doing well.     Volanda Napoleon, MD 7/13/202212:37 PM

## 2020-11-21 NOTE — Progress Notes (Signed)
Patient is doing well. 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 He is happy to report that his social security disability was approved. This is a huge financial help for him. He brings with him a form to be filled out for a disability policy that he had from before his diagnosis. Form given to Laverna Peace NP with the request to mail completed form to his home.   Oncology Nurse Navigator Documentation  Oncology Nurse Navigator Flowsheets 11/21/2020  Abnormal Finding Date -  Confirmed Diagnosis Date -  Diagnosis Status -  Phase of Treatment -  Chemotherapy Actual Start Date: -  Expected Surgery Date -  Navigator Follow Up Date: 03/27/2021  Navigator Follow Up Reason: Follow-up Appointment  Navigator Location [facility]  Referral Date to RadOnc/MedOnc -  Navigator Encounter Type Follow-up Appt  Telephone -  Treatment Initiated Date -  Patient Visit Type MedOnc  Treatment Phase Active Tx  Barriers/Navigation Needs Coordination of Care;Education  Education -  Interventions Disability/FMLA;Psycho-Social Support  Acuity Level 2-Minimal Needs (1-2 Barriers Identified)  Referrals -  Coordination of Care -  Education Method -  Support Groups/Services Friends and Family  Time Spent with Patient 30

## 2020-11-22 ENCOUNTER — Other Ambulatory Visit (HOSPITAL_COMMUNITY): Payer: Self-pay

## 2020-11-22 ENCOUNTER — Other Ambulatory Visit: Payer: Self-pay | Admitting: Hematology & Oncology

## 2020-11-22 ENCOUNTER — Encounter: Payer: Self-pay | Admitting: Hematology

## 2020-11-22 LAB — IGG, IGA, IGM
IgA: 10 mg/dL — ABNORMAL LOW (ref 90–386)
IgG (Immunoglobin G), Serum: 616 mg/dL (ref 603–1613)
IgM (Immunoglobulin M), Srm: 6 mg/dL — ABNORMAL LOW (ref 20–172)

## 2020-11-22 MED ORDER — VENETOCLAX 100 MG PO TABS
ORAL_TABLET | ORAL | 6 refills | Status: DC
  Filled 2020-11-22: qty 30, fill #0
  Filled 2020-11-27: qty 30, 30d supply, fill #0
  Filled 2020-12-19 – 2020-12-25 (×2): qty 30, 30d supply, fill #1
  Filled 2021-01-18: qty 30, 30d supply, fill #2
  Filled 2021-02-18: qty 30, 30d supply, fill #3
  Filled 2021-03-22: qty 30, 30d supply, fill #4
  Filled 2021-04-22: qty 30, 30d supply, fill #5
  Filled 2021-05-28: qty 30, 30d supply, fill #6

## 2020-11-26 LAB — IMMUNOFIXATION REFLEX, SERUM
IgA: 11 mg/dL — ABNORMAL LOW (ref 90–386)
IgG (Immunoglobin G), Serum: 677 mg/dL (ref 603–1613)
IgM (Immunoglobulin M), Srm: 6 mg/dL — ABNORMAL LOW (ref 20–172)

## 2020-11-26 LAB — PROTEIN ELECTROPHORESIS, SERUM, WITH REFLEX
A/G Ratio: 2 — ABNORMAL HIGH (ref 0.7–1.7)
Albumin ELP: 3.9 g/dL (ref 2.9–4.4)
Alpha-1-Globulin: 0.1 g/dL (ref 0.0–0.4)
Alpha-2-Globulin: 0.5 g/dL (ref 0.4–1.0)
Beta Globulin: 0.9 g/dL (ref 0.7–1.3)
Gamma Globulin: 0.5 g/dL (ref 0.4–1.8)
Globulin, Total: 2 g/dL — ABNORMAL LOW (ref 2.2–3.9)
M-Spike, %: 0.2 g/dL — ABNORMAL HIGH
SPEP Interpretation: 0
Total Protein ELP: 5.9 g/dL — ABNORMAL LOW (ref 6.0–8.5)

## 2020-11-27 ENCOUNTER — Other Ambulatory Visit (HOSPITAL_COMMUNITY): Payer: Self-pay

## 2020-12-19 ENCOUNTER — Other Ambulatory Visit (HOSPITAL_COMMUNITY): Payer: Self-pay

## 2020-12-24 ENCOUNTER — Other Ambulatory Visit (HOSPITAL_COMMUNITY): Payer: Self-pay

## 2020-12-24 ENCOUNTER — Telehealth: Payer: Self-pay | Admitting: Pharmacist

## 2020-12-24 NOTE — Telephone Encounter (Signed)
Oral Oncology Pharmacist Encounter   Prior Authorization for Lynita Lombard has been approved by [facility].     Effective dates: 12/24/20 through 12/23/21   Oral Oncology Clinic will continue to follow.   Darl Pikes, PharmD, BCPS. BCOP Hematology/Oncology Clinical Pharmacist [facility] Oral Chemotherapy [facility] [phone]  12/24/2020 5:46 PM

## 2020-12-25 ENCOUNTER — Other Ambulatory Visit (HOSPITAL_COMMUNITY): Payer: Self-pay

## 2020-12-28 ENCOUNTER — Other Ambulatory Visit: Payer: Self-pay | Admitting: *Deleted

## 2020-12-28 ENCOUNTER — Other Ambulatory Visit: Payer: Self-pay | Admitting: Hematology & Oncology

## 2020-12-28 ENCOUNTER — Encounter: Payer: Self-pay | Admitting: *Deleted

## 2020-12-28 MED ORDER — ACYCLOVIR 400 MG PO TABS: 400.0000 mg | ORAL_TABLET | Freq: Two times a day (BID) | ORAL | 3 refills | Status: DC

## 2021-01-18 ENCOUNTER — Other Ambulatory Visit (HOSPITAL_COMMUNITY): Payer: Self-pay

## 2021-01-23 ENCOUNTER — Other Ambulatory Visit (HOSPITAL_COMMUNITY): Payer: Self-pay

## 2021-02-11 ENCOUNTER — Encounter: Payer: Self-pay | Admitting: Hematology

## 2021-02-18 ENCOUNTER — Other Ambulatory Visit (HOSPITAL_COMMUNITY): Payer: Self-pay

## 2021-02-18 ENCOUNTER — Encounter: Payer: Self-pay | Admitting: Hematology

## 2021-02-19 ENCOUNTER — Encounter: Payer: Self-pay | Admitting: Hematology

## 2021-02-21 ENCOUNTER — Other Ambulatory Visit (HOSPITAL_COMMUNITY): Payer: Self-pay

## 2021-02-26 ENCOUNTER — Ambulatory Visit: Payer: Medicare Other | Admitting: Infectious Disease

## 2021-02-26 ENCOUNTER — Encounter: Payer: Self-pay | Admitting: Infectious Disease

## 2021-02-26 ENCOUNTER — Other Ambulatory Visit: Payer: Self-pay

## 2021-02-26 VITALS — BP 155/99 | HR 80 | Temp 97.8°F | Ht 69.0 in | Wt 245.0 lb

## 2021-02-26 DIAGNOSIS — C9110 Chronic lymphocytic leukemia of B-cell type not having achieved remission: Secondary | ICD-10-CM | POA: Diagnosis not present

## 2021-02-26 DIAGNOSIS — D801 Nonfamilial hypogammaglobulinemia: Secondary | ICD-10-CM

## 2021-02-26 DIAGNOSIS — B451 Cerebral cryptococcosis: Secondary | ICD-10-CM

## 2021-02-26 DIAGNOSIS — Z23 Encounter for immunization: Secondary | ICD-10-CM

## 2021-02-26 MED ORDER — FLUCONAZOLE 200 MG PO TABS: 200.0000 mg | ORAL_TABLET | Freq: Every day | ORAL | 10 refills | Status: DC

## 2021-02-26 NOTE — Progress Notes (Signed)
Subjective:   Chief complaint follow-up for cryptococcal meningitis on fluconazole   Patient ID: David Whitehead, male    DOB: 04-20-1971, 50 y.o.   MRN: [id number]  HPI   David Whitehead is a 50 year old Caucasian male who was on Imbruvica for CLL and then developed severe cryptococcal meningitis.  He underwent VP shunt placement by Dr. Cyndy Freeze while in the hospital for control of his high intracranial pressures.  He completed nearly a month of amphotericin with flucytosine.  Towards the end of his month of therapy he did develop a drug rash and acute kidney injury both attributed to the amphotericin.  He was given prednisone for 3 days and still had the rash at discharge.   He had follow-up labs as an outpatient with his primary care physician which showed his creatinine was still around the same range as it was when he was discharged from the hospital.  He thought the rash was getting worse but actually since he was seen by the PCP it is actually been improving.   Per Dr. [name] the rash was largely on his flank and torso but extended to his extremities when it was at its worse.   The patient confirmed this and showed me that the rash had disappeared from his lower extremities and his back.  It also dissappeared from his torso.  T   I last saw him in clinic in early November 2020 and he was having result resolution of this rash.  He is currently on venotoclax with Dr. Marin Olp for his CLL.  He has no headaches or symptoms that would be concerning for recurrence of his cryptococcal meningitis.  He did have a recent outbreak of zoster several visits ago  He been feeling relatively well recently though he does suffer from fatigue which he attributes to his chemotherapy.  He had some concern for tick borne infection after who had pulled a fairly engorged tick off of his back on Friday before one of his last visit with m.  He did not know how long it had been attached it was a deer tick  apparently.  He has subsequently developed worsening fatigue as well as myalgias particularly in his knees also some low-grade nausea.  He was concerned about potentially having a tickborne infection.  Also discussed the idea of coming off the fluconazole since he has had adequate treatment based on guidelines but he wanted to stay on it for an additional 6 months.  Regarding the tick bite we did check some labs none of which were suggestive of Ehrlichia or RMSF.  He has baseline acute and subsequent convalsecent  titers were negative for Borrelia Ehrlichia and RMSF.  He remains on fluconazole and continues on venotoclax--the latter requiring less of a dose due to drug drug interaction with fluconazole.      Past Medical History:  Diagnosis Date   Cancer [facility])    Chronic headache    CLL (chronic lymphocytic leukemia) (HCC)    Diplopia    Edema 03/14/2019   Hypertension    Leukocytosis 04/01/2018   Lower extremity edema 03/14/2019   Molluscum contagiosum 10/26/2019   Myalgia 09/17/2020   Sleep apnea    uses a cpap   Tick bite 09/17/2020    Past Surgical History:  Procedure Laterality Date   AXILLARY LYMPH NODE DISSECTION Right 04/16/2018   Procedure: AXILLARY LYMPH NODE DEEP EXCISION;  Surgeon: Fanny Skates, MD;  Location: [place];  Service: General;  Laterality: Right;   COLONOSCOPY  N/A 04/01/2019   Procedure: COLONOSCOPY;  Surgeon: Danie Binder, MD;  [facility];  Service: Endoscopy;  Laterality: N/A;  12:45pm   ESOPHAGOGASTRODUODENOSCOPY N/A 04/01/2019   Procedure: ESOPHAGOGASTRODUODENOSCOPY (EGD);  Surgeon: Danie Binder, MD;  [facility];  Service: Endoscopy;  Laterality: N/A;   FINGER SURGERY     VENTRICULOPERITONEAL SHUNT Right 02/18/2019   Procedure: SHUNT INSERTION VENTRICULAR-PERITONEAL;  Surgeon: Ashok Pall, MD;  Location: [place];  Service: Neurosurgery;  Laterality: Right;  SHUNT INSERTION VENTRICULAR-PERITONEAL    Family History  Problem  Relation Age of Onset   Colon cancer Father 16       passed from [facility]   Colon cancer Paternal Uncle 74       passed from [place] History   Marital status: Married    Spouse name: [name]   Number of children: 0   Years of education: 13   Highest education level: Not on file  Occupational History   Not on file  Tobacco Use   Smoking status: Never   Smokeless tobacco: Never  Vaping Use   Vaping Use: Never used  Substance and Sexual Activity   Alcohol use: Not Currently   Drug use: Never   Sexual activity: Yes  Other Topics Concern   Not on file  Social History Narrative   Right handed    Lives with wife, [name]   Caffeine use: rare   Social Determinants of Health   Financial Resource Strain: Not on file  Food Insecurity: Not on file  Transportation Needs: Not on file  Physical Activity: Not on file  Stress: Not on file  Social Connections: Not on file    Allergies  Allergen Reactions   Bactrim [Sulfamethoxazole-Trimethoprim] Rash   Doxycycline Calcium Itching    "felt like pins and needles were in face after one dose."   Latex Other (See Comments)    Took skin off when removed tape   Allopurinol Itching and Rash   Amoxicillin Rash    Has patient had a PCN reaction causing immediate rash, facial/tongue/throat swelling, SOB or lightheadedness with hypotension: No Has patient had a PCN reaction causing severe rash involving mucus membranes or skin necrosis: No Has patient had a PCN reaction that required hospitalization: No Has patient had a PCN reaction occurring within the last 10 years: No If all of the above answers are "NO", then may proceed with Cephalosporin use.      Current Outpatient Medications:    acyclovir (ZOVIRAX) 400 MG tablet, Take 1 tablet (400 mg total) by mouth 2 (two) times daily., Disp: 60 tablet, Rfl: 3   cetirizine (ZYRTEC) 10 MG tablet, Take 10 mg by mouth daily., Disp: , Rfl:    fluconazole (DIFLUCAN) 200  MG tablet, Take 1 tablet (200 mg total) by mouth daily., Disp: 30 tablet, Rfl: 10   fluconazole (DIFLUCAN) 200 MG tablet, Take 200 mg by mouth daily., Disp: , Rfl:    minocycline (MINOCIN) 100 MG capsule, Take 1 capsule (100 mg total) by mouth 2 (two) times daily., Disp: 20 capsule, Rfl: 0   Prenatal Vit-Fe Fumarate-FA (PRENATAL MULTIVITAMIN) TABS tablet, Take 1 tablet by mouth daily at 12 noon., Disp: , Rfl:    venetoclax (VENCLEXTA) 100 MG tablet, TAKE 1 TABLET (100 MG) BY MOUTH DAILY., Disp: 30 tablet, Rfl: 6   Review of Systems  Constitutional:  Positive for fatigue. Negative for activity change, appetite change, chills, diaphoresis,  fever and unexpected weight change.  HENT:  Negative for congestion, rhinorrhea, sinus pressure, sneezing, sore throat and trouble swallowing.   Eyes:  Negative for photophobia and visual disturbance.  Respiratory:  Negative for cough, chest tightness, shortness of breath, wheezing and stridor.   Cardiovascular:  Negative for chest pain, palpitations and leg swelling.  Gastrointestinal:  Negative for abdominal distention, abdominal pain, anal bleeding, blood in stool, constipation, diarrhea, nausea and vomiting.  Genitourinary:  Negative for difficulty urinating, dysuria, flank pain and hematuria.  Musculoskeletal:  Negative for arthralgias, back pain, gait problem, joint swelling and myalgias.  Skin:  Negative for color change, pallor, rash and wound.  Neurological:  Negative for dizziness, tremors, weakness and light-headedness.  Hematological:  Negative for adenopathy. Does not bruise/bleed easily.  Psychiatric/Behavioral:  Negative for agitation, behavioral problems, confusion, decreased concentration, dysphoric mood and sleep disturbance.       Objective:   Physical Exam Constitutional:      Appearance: He is well-developed.  HENT:     Head: Normocephalic and atraumatic.  Eyes:     Conjunctiva/sclera: Conjunctivae normal.  Cardiovascular:     Rate  and Rhythm: Normal rate and regular rhythm.  Pulmonary:     Effort: Pulmonary effort is normal. No respiratory distress.     Breath sounds: No wheezing.  Abdominal:     General: There is no distension.     Palpations: Abdomen is soft.  Musculoskeletal:        General: No tenderness. Normal range of motion.     Cervical back: Normal range of motion and neck supple.  Skin:    General: Skin is warm and dry.     Coloration: Skin is not pale.     Findings: No erythema or rash.  Neurological:     General: No focal deficit present.     Mental Status: He is alert and oriented to person, place, and time.  Psychiatric:        Mood and Affect: Mood normal.        Behavior: Behavior normal.        Thought Content: Thought content normal.        Judgment: Judgment normal.          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  We will check CMP CBC with differential.  Also check a serum cryptococcal antigen.  For now we will continue fluconazole for an additional 6 months.  I will also discussed with [facility]. 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

## 2021-02-28 LAB — CBC WITH DIFFERENTIAL/PLATELET
Absolute Monocytes: 495 cells/uL (ref 200–950)
Basophils Absolute: 39 cells/uL (ref 0–200)
Basophils Relative: 0.8 %
Eosinophils Absolute: 123 cells/uL (ref 15–500)
Eosinophils Relative: 2.5 %
HCT: 47.9 % (ref 38.5–50.0)
Hemoglobin: 15.9 g/dL (ref 13.2–17.1)
Lymphs Abs: 1230 cells/uL (ref 850–3900)
MCH: 27.5 pg (ref 27.0–33.0)
MCHC: 33.2 g/dL (ref 32.0–36.0)
MCV: 82.9 fL (ref 80.0–100.0)
MPV: 10.4 fL (ref 7.5–12.5)
Monocytes Relative: 10.1 %
Neutro Abs: 3014 cells/uL (ref 1500–7800)
Neutrophils Relative %: 61.5 %
Platelets: 160 10*3/uL (ref 140–400)
RBC: 5.78 10*6/uL (ref 4.20–5.80)
RDW: 12.6 % (ref 11.0–15.0)
Total Lymphocyte: 25.1 %
WBC: 4.9 10*3/uL (ref 3.8–10.8)

## 2021-02-28 LAB — COMPLETE METABOLIC PANEL WITH GFR
AG Ratio: 2.5 (calc) (ref 1.0–2.5)
ALT: 54 U/L — ABNORMAL HIGH (ref 9–46)
AST: 28 U/L (ref 10–40)
Albumin: 4.5 g/dL (ref 3.6–5.1)
Alkaline phosphatase (APISO): 61 U/L (ref 36–130)
BUN: 16 mg/dL (ref 7–25)
CO2: 27 mmol/L (ref 20–32)
Calcium: 9.7 mg/dL (ref 8.6–10.3)
Chloride: 104 mmol/L (ref 98–110)
Creat: 1.07 mg/dL (ref 0.60–1.29)
Globulin: 1.8 g/dL (calc) — ABNORMAL LOW (ref 1.9–3.7)
Glucose, Bld: 113 mg/dL — ABNORMAL HIGH (ref 65–99)
Potassium: 4.2 mmol/L (ref 3.5–5.3)
Sodium: 141 mmol/L (ref 135–146)
Total Bilirubin: 0.4 mg/dL (ref 0.2–1.2)
Total Protein: 6.3 g/dL (ref 6.1–8.1)
eGFR: 86 mL/min/{1.73_m2} (ref >=60)

## 2021-02-28 LAB — CRYPTOCOCCAL AG, LTX SCR RFLX TITER
Cryptococcal Ag Screen: NOT DETECTED
MICRO NUMBER:: 12519965
SPECIMEN QUALITY:: ADEQUATE

## 2021-03-13 DIAGNOSIS — H5213 Myopia, bilateral: Secondary | ICD-10-CM | POA: Diagnosis not present

## 2021-03-14 ENCOUNTER — Other Ambulatory Visit (HOSPITAL_COMMUNITY): Payer: Self-pay

## 2021-03-22 ENCOUNTER — Other Ambulatory Visit (HOSPITAL_COMMUNITY): Payer: Self-pay

## 2021-03-24 IMAGING — CT CT HEAD W/O CM
3 of 7 series · 16 of 47 positions shown, 19 images · non-contrast
Comparison: None.

CLINICAL DATA: Headaches which are worsening. Chronic sinus
infection.

EXAM:
CT HEAD WITHOUT CONTRAST
CT MAXILLOFACIAL WITHOUT CONTRAST
TECHNIQUE: Multidetector CT imaging of the head and maxillofacial structures
were performed using the standard protocol without intravenous
contrast. Multiplanar CT image reconstructions of the maxillofacial
structures were also generated.

[Series 6: max soft · axial · 0.34mm/px · z∈[-272,-124]mm · 11 of 90 slices shown, 14 images]
[im 8/90  brain]
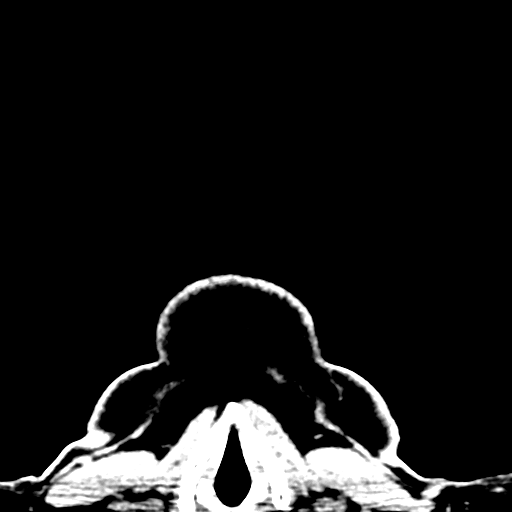
[im 8/90  bone]
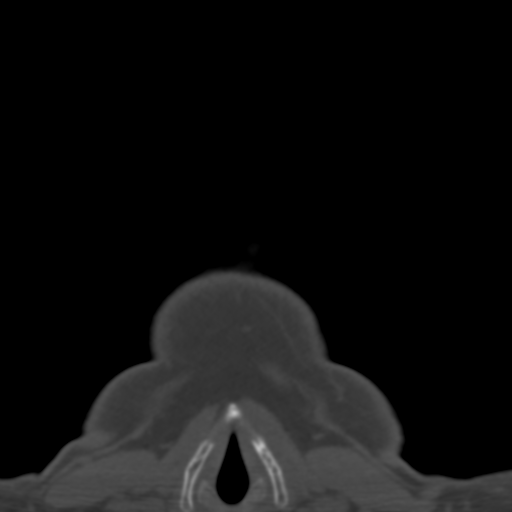
[im 15/90  brain]
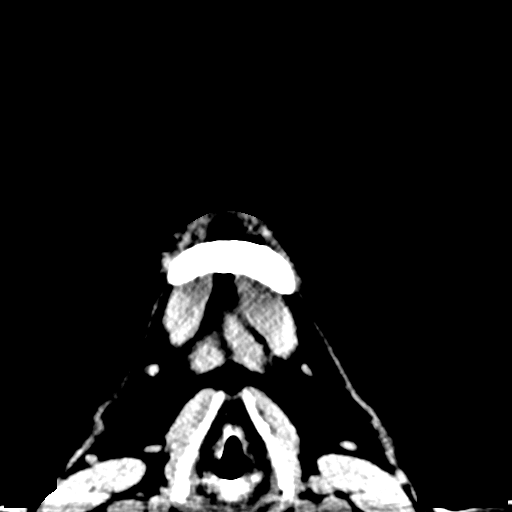
[im 23/90  brain]
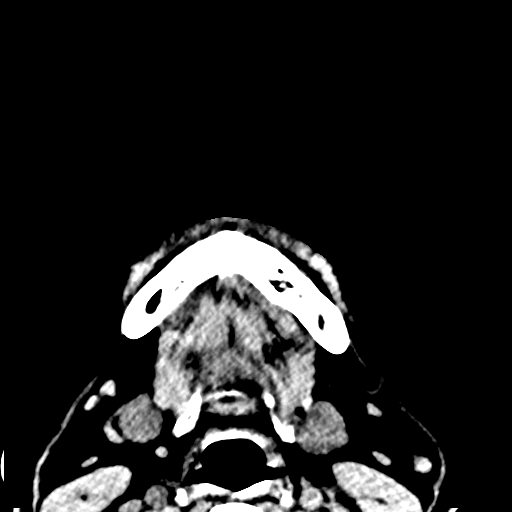
[im 30/90  brain]
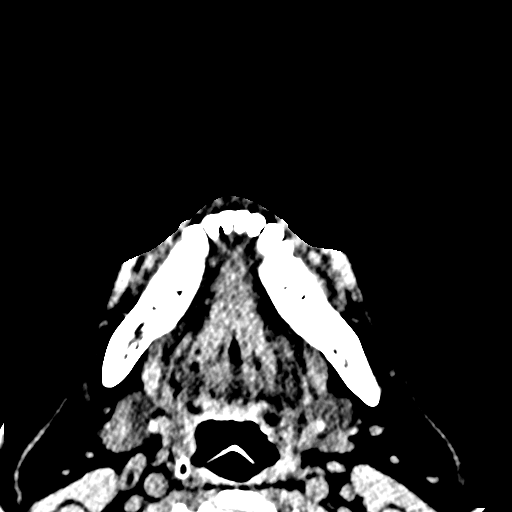
[im 38/90  brain]
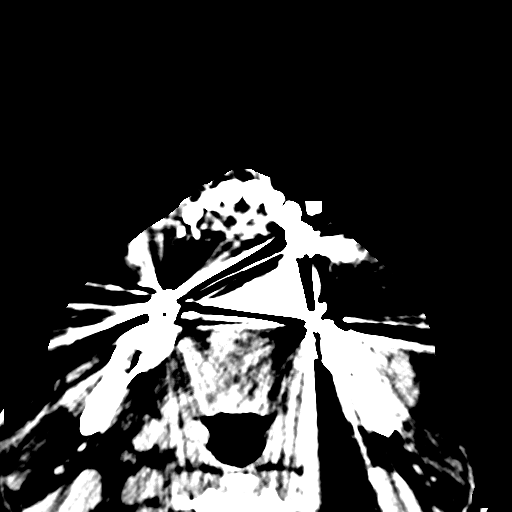
[im 38/90  bone]
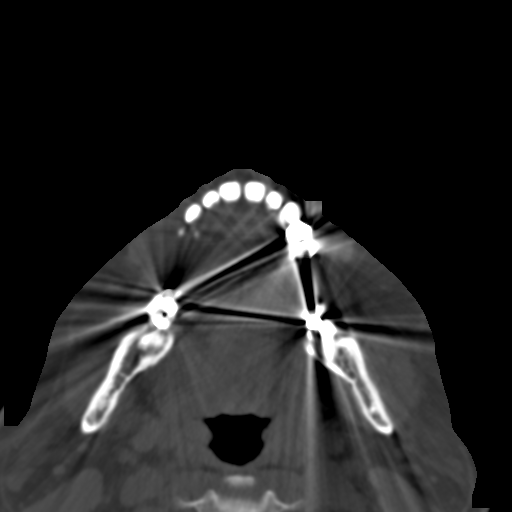
[im 45/90  brain]
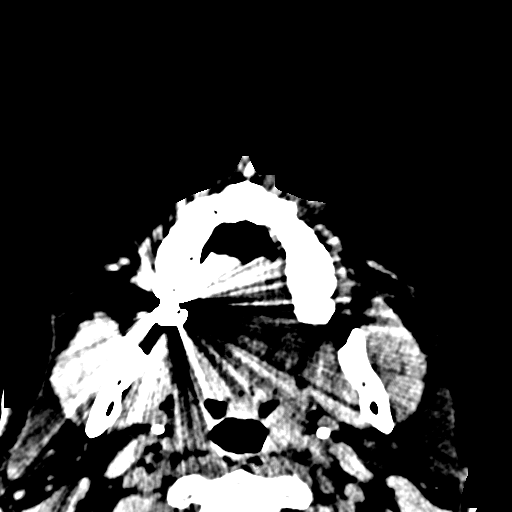
[im 52/90  brain]
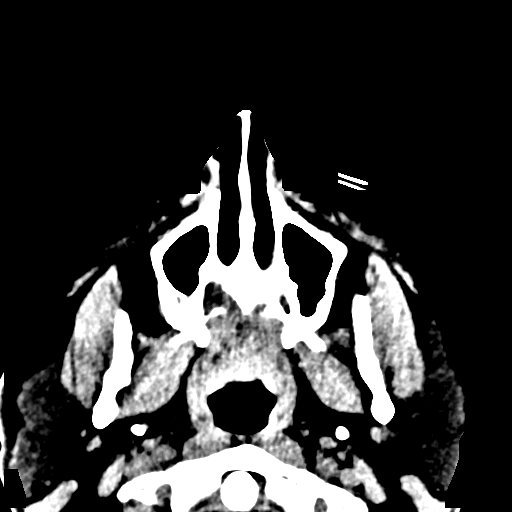
[im 60/90  brain]
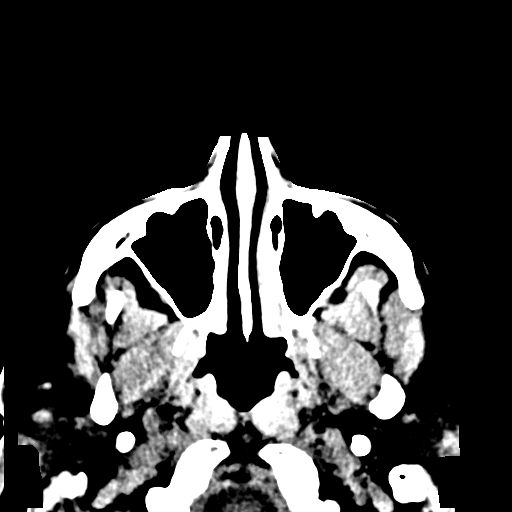
[im 67/90  brain]
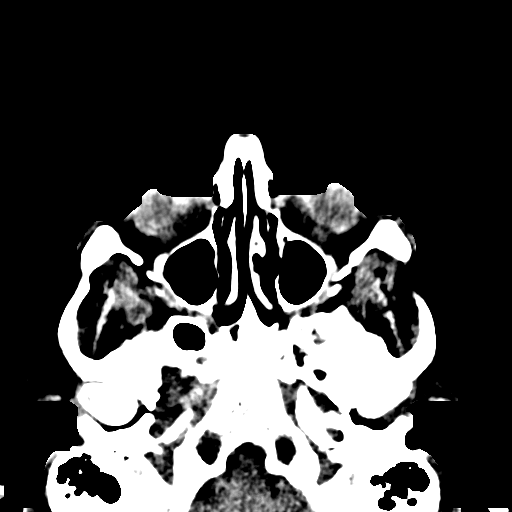
[im 67/90  bone]
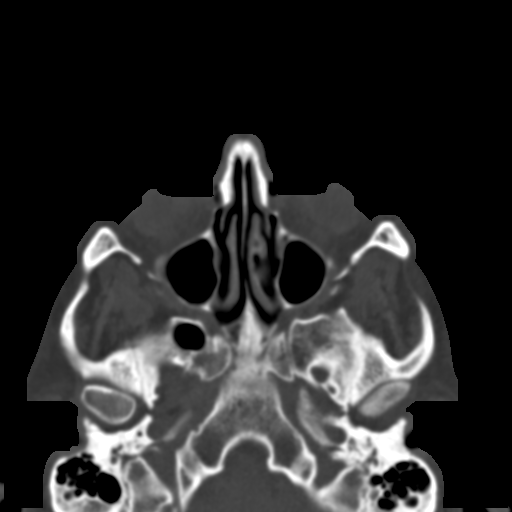
[im 75/90  brain]
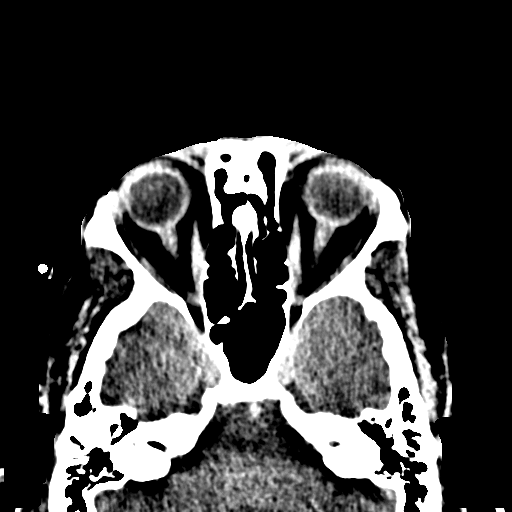
[im 82/90  brain]
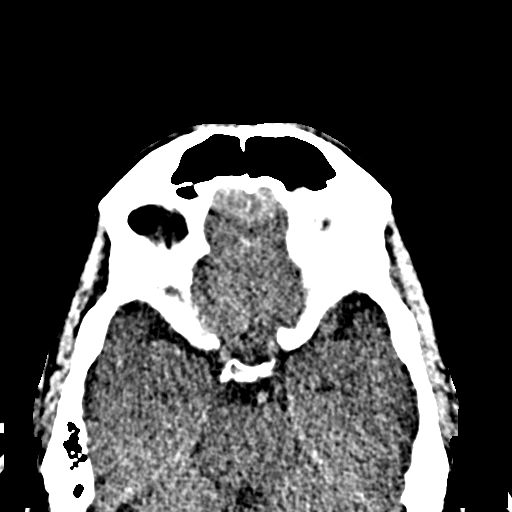

[Series 10: coronal soft · coronal · 0.36mm/px · 3 of 86 slices shown]
[im 18/86  brain]
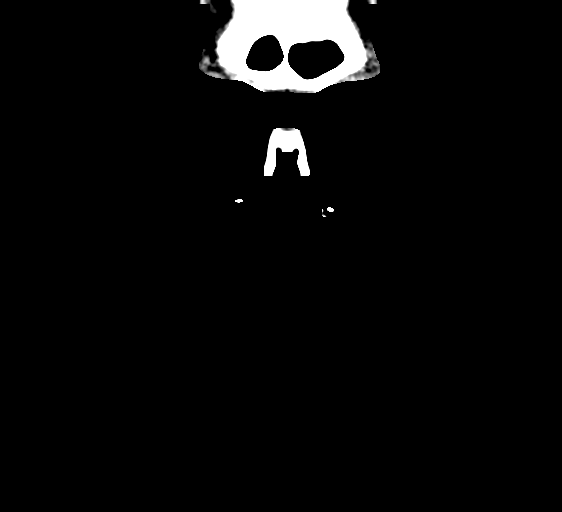
[im 35/86  brain]
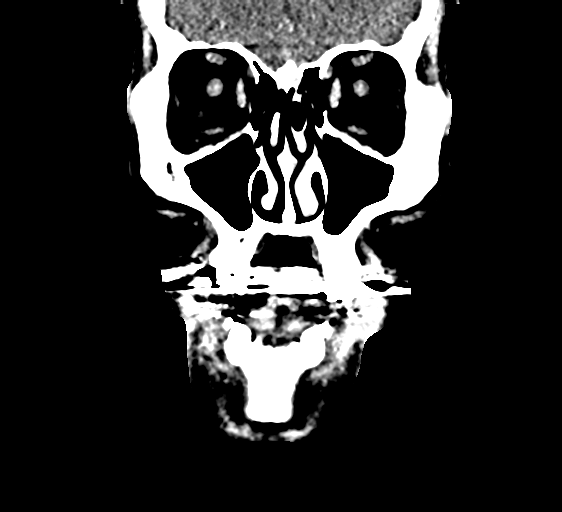
[im 52/86  brain]
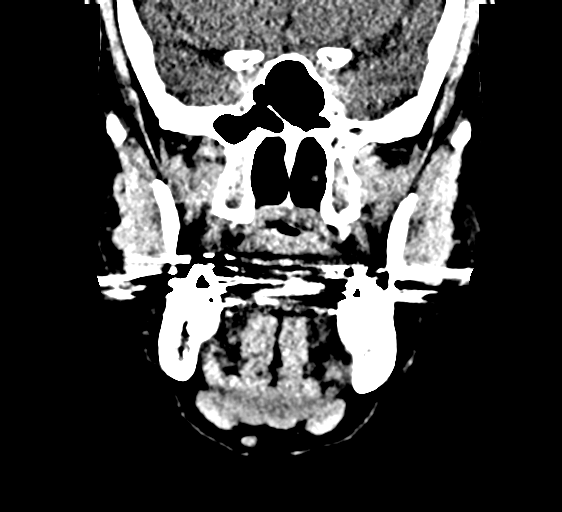

[Series 11: sagittal soft · sagittal · 0.35mm/px · 2 of 93 slices shown]
[im 31/93  brain]
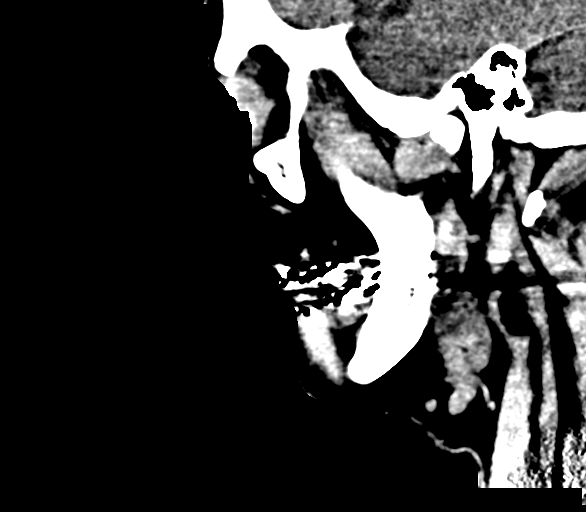
[im 62/93  brain]
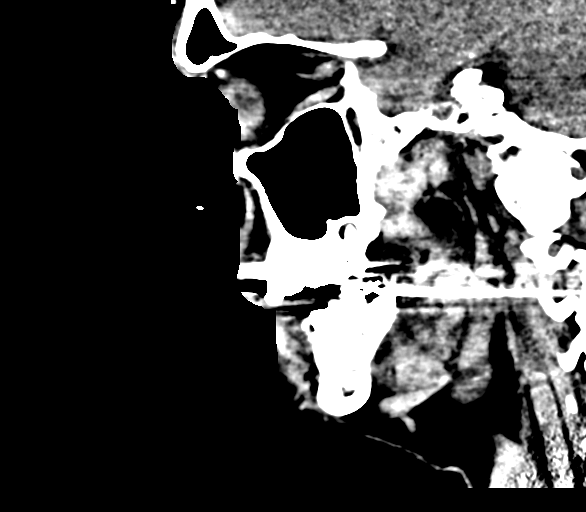

[16 of 47 positions shown; findings below may reference images not displayed]

FINDINGS: CT HEAD FINDINGS

Brain: No evidence of acute infarction, hemorrhage, hydrocephalus,
extra-axial collection or mass lesion/mass effect.

Vascular: No hyperdense vessel or unexpected calcification.

Skull: No osseous abnormality.

Sinuses/Orbits: Visualized paranasal sinuses are clear. Visualized
mastoid sinuses are clear. Visualized orbits demonstrate no focal
abnormality.

Other: None

CT MAXILLOFACIAL FINDINGS

Osseous: No fracture or mandibular dislocation. No destructive
process.

Orbits: Negative. No traumatic or inflammatory finding.

Sinuses: Clear.

Soft tissues: Negative.
IMPRESSION: 1. No acute intracranial pathology.
2.  No acute osseous injury of the maxillofacial bones.
3. No evidence of sinusitis.

## 2021-03-27 ENCOUNTER — Encounter: Payer: Self-pay | Admitting: *Deleted

## 2021-03-27 ENCOUNTER — Inpatient Hospital Stay: Payer: Medicare Other | Attending: Hematology

## 2021-03-27 ENCOUNTER — Encounter: Payer: Self-pay | Admitting: Family

## 2021-03-27 ENCOUNTER — Inpatient Hospital Stay: Payer: Medicare Other | Admitting: Family

## 2021-03-27 ENCOUNTER — Other Ambulatory Visit: Payer: Self-pay

## 2021-03-27 ENCOUNTER — Other Ambulatory Visit (HOSPITAL_COMMUNITY): Payer: Self-pay

## 2021-03-27 VITALS — BP 140/92 | HR 77 | Temp 98.1°F | Resp 18 | Ht 69.0 in | Wt 243.0 lb

## 2021-03-27 DIAGNOSIS — C9110 Chronic lymphocytic leukemia of B-cell type not having achieved remission: Secondary | ICD-10-CM

## 2021-03-27 DIAGNOSIS — D696 Thrombocytopenia, unspecified: Secondary | ICD-10-CM

## 2021-03-27 DIAGNOSIS — B451 Cerebral cryptococcosis: Secondary | ICD-10-CM | POA: Diagnosis not present

## 2021-03-27 LAB — CMP (CANCER CENTER ONLY)
ALT: 48 U/L — ABNORMAL HIGH (ref 0–44)
AST: 26 U/L (ref 15–41)
Albumin: 4.4 g/dL (ref 3.5–5.0)
Alkaline Phosphatase: 58 U/L (ref 38–126)
Anion gap: 6 (ref 5–15)
BUN: 14 mg/dL (ref 6–20)
CO2: 31 mmol/L (ref 22–32)
Calcium: 9.9 mg/dL (ref 8.9–10.3)
Chloride: 103 mmol/L (ref 98–111)
Creatinine: 1.14 mg/dL (ref 0.61–1.24)
GFR, Estimated: >60 mL/min (ref >60)
Glucose, Bld: 93 mg/dL (ref 70–99)
Potassium: 4.2 mmol/L (ref 3.5–5.1)
Sodium: 140 mmol/L (ref 135–145)
Total Bilirubin: 0.6 mg/dL (ref 0.3–1.2)
Total Protein: 6.9 g/dL (ref 6.5–8.1)

## 2021-03-27 LAB — CBC WITH DIFFERENTIAL (CANCER CENTER ONLY)
Abs Immature Granulocytes: 0.03 10*3/uL (ref 0.00–0.07)
Basophils Absolute: 0 10*3/uL (ref 0.0–0.1)
Basophils Relative: 1 %
Eosinophils Absolute: 0.1 10*3/uL (ref 0.0–0.5)
Eosinophils Relative: 2 %
HCT: 45.3 % (ref 39.0–52.0)
Hemoglobin: 15.3 g/dL (ref 13.0–17.0)
Immature Granulocytes: 1 %
Lymphocytes Relative: 41 %
Lymphs Abs: 2.1 10*3/uL (ref 0.7–4.0)
MCH: 27.8 pg (ref 26.0–34.0)
MCHC: 33.8 g/dL (ref 30.0–36.0)
MCV: 82.2 fL (ref 80.0–100.0)
Monocytes Absolute: 0.6 10*3/uL (ref 0.1–1.0)
Monocytes Relative: 12 %
Neutro Abs: 2.3 10*3/uL (ref 1.7–7.7)
Neutrophils Relative %: 43 %
Platelet Count: 157 10*3/uL (ref 150–400)
RBC: 5.51 MIL/uL (ref 4.22–5.81)
RDW: 12.4 % (ref 11.5–15.5)
WBC Count: 5.2 10*3/uL (ref 4.0–10.5)
nRBC: 0 % (ref 0.0–0.2)

## 2021-03-27 LAB — SAVE SMEAR(SSMR), FOR PROVIDER SLIDE REVIEW

## 2021-03-27 NOTE — Progress Notes (Signed)
Hematology and Oncology Follow Up Visit  David Whitehead [id number] 1970/08/30 50 y.o. 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 daily Venetoclax 100 mg po q day -- start on 01/24/2020      Interim History:  [name] is here today for follow-up. He is doing well but notes chronic fatigue.  He has had some issues with HTN noted at his last several doctors visits. He will start checking twice a day at home with his cuff and keep a log for his PCP.  He denies headaches, dizziness, vision changes or loss.  No fever, chills, n/v, cough, rash, SOB, chest pain, palpitations, abdominal pain or changes in bowel or bladder habits.  No blood loss noted. No bruising or petechiae.  No swelling, tenderness, numbness or tingling in his extremities at this time.  No falls or syncope.  He has a good appetite and is staying well hydrated.  He stays quite busy helping his wife care for her business.   ECOG Performance Status: 0 - Asymptomatic  Medications:  Allergies as of 03/27/2021       Reactions   Bactrim [sulfamethoxazole-trimethoprim] Rash   Doxycycline Calcium Itching   "felt like pins and needles were in face after one dose."   Latex Other (See Comments)   Took skin off when removed tape   Allopurinol Itching, Rash   Amoxicillin Rash   Has patient had a PCN reaction causing immediate rash, facial/tongue/throat swelling, SOB or lightheadedness with hypotension: No Has patient had a PCN reaction causing severe rash involving mucus membranes or skin necrosis: No Has patient had a PCN reaction that required hospitalization: No Has patient had a PCN reaction occurring within the last 10 years: No If all of the above answers are "NO", then may proceed with Cephalosporin use.        Medication List        Accurate as of March 27, 2021 12:59 PM. If you have any  questions, ask your nurse or doctor.          acyclovir 400 MG tablet Commonly known as: ZOVIRAX Take 1 tablet (400 mg total) by mouth 2 (two) times daily.   cetirizine 10 MG tablet Commonly known as: ZYRTEC Take 10 mg by mouth daily.   fluconazole 200 MG tablet Commonly known as: DIFLUCAN Take 1 tablet (200 mg total) by mouth daily. What changed: Another medication with the same name was removed. Continue taking this medication, and follow the directions you see here. Changed by: Lottie Dawson, NP   minocycline 100 MG capsule Commonly known as: Minocin Take 1 capsule (100 mg total) by mouth 2 (two) times daily.   prenatal multivitamin Tabs tablet Take 1 tablet by mouth daily at 12 noon.   Venclexta 100 MG tablet Generic drug: venetoclax TAKE 1 TABLET (100 MG) BY MOUTH DAILY.        Allergies:  Allergies  Allergen Reactions   Bactrim [Sulfamethoxazole-Trimethoprim] Rash   Doxycycline Calcium Itching    "felt like pins and needles were in face after one dose."   Latex Other (See Comments)    Took skin off when removed tape   Allopurinol Itching and Rash   Amoxicillin Rash    Has patient had a PCN reaction causing immediate rash, facial/tongue/throat swelling, SOB or lightheadedness with hypotension: No Has patient had a PCN reaction causing severe  rash involving mucus membranes or skin necrosis: No Has patient had a PCN reaction that required hospitalization: No Has patient had a PCN reaction occurring within the last 10 years: No If all of the above answers are "NO", then may proceed with Cephalosporin use.     Past Medical History, Surgical history, Social history, and Family History were reviewed and updated.  Review of Systems: All other 10 point review of systems is negative.   Physical Exam:  height is 5\' 9"  (1.753 m) and weight is 243 lb 0.6 oz (110.2 kg). His oral temperature is 98.1 F (36.7 C). His blood pressure is 140/92 (abnormal) and his pulse  is 77. His respiration is 18 and oxygen saturation is 100%.   Wt Readings from Last 3 Encounters:  03/27/21 243 lb 0.6 oz (110.2 kg)  02/26/21 245 lb (111.1 kg)  11/21/20 240 lb (108.9 kg)    Ocular: Sclerae unicteric, pupils equal, round and reactive to light Ear-nose-throat: Oropharynx clear, dentition fair Lymphatic: No cervical or supraclavicular adenopathy Lungs no rales or rhonchi, good excursion bilaterally Heart regular rate and rhythm, no murmur appreciated Abd soft, nontender, positive bowel sounds MSK no focal spinal tenderness, no joint edema Neuro: non-focal, well-oriented, appropriate affect Breasts: Deferred   Lab Results  Component Value Date   WBC 5.2 03/27/2021   HGB 15.3 03/27/2021   HCT 45.3 03/27/2021   MCV 82.2 03/27/2021   PLT 157 03/27/2021   Lab Results  Component Value Date   FERRITIN 42 11/02/2019   IRON 48 11/02/2019   TIBC 329 11/02/2019   UIBC 280 11/02/2019   IRONPCTSAT 15 (L) 11/02/2019   Lab Results  Component Value Date   RBC 5.51 03/27/2021   Lab Results  Component Value Date   KPAFRELGTCHN 82.6 (H) 01/02/2020   LAMBDASER 4.3 (L) 01/02/2020   KAPLAMBRATIO 19.21 (H) 01/02/2020   Lab Results  Component Value Date   IGGSERUM 677 11/21/2020   IGA 11 (L) 11/21/2020   IGMSERUM 6 (L) 11/21/2020   Lab Results  Component Value Date   TOTALPROTELP 5.9 (L) 11/21/2020   ALBUMINELP 3.9 11/21/2020   A1GS 0.1 11/21/2020   A2GS 0.5 11/21/2020   BETS 0.9 11/21/2020   GAMS 0.5 11/21/2020   MSPIKE 0.2 (H) 11/21/2020     Chemistry      Component Value Date/Time   NA 140 03/27/2021 1135   NA 140 03/08/2019 1438   K 4.2 03/27/2021 1135   CL 103 03/27/2021 1135   CO2 31 03/27/2021 1135   BUN 14 03/27/2021 1135   BUN 25 (H) 03/08/2019 1438   CREATININE 1.14 03/27/2021 1135   CREATININE 1.07 02/26/2021 1130      Component Value Date/Time   CALCIUM 9.9 03/27/2021 1135   ALKPHOS 58 03/27/2021 1135   AST 26 03/27/2021 1135   ALT 48  (H) 03/27/2021 1135   BILITOT 0.6 03/27/2021 1135       Impression and Plan: [name] is a very pleasant 50 yo gentleman with an aggressive CLL by virtue of the cytogenetics.  He will continue his same regimen with Venetoclax.  Follow-up in 4 months.  He can contact our office with any questions or concerns.   Lottie Dawson, NP 11/16/202212:59 PM

## 2021-03-27 NOTE — Progress Notes (Signed)
Oncology Nurse Navigator Documentation  Oncology Nurse Navigator Flowsheets 03/27/2021  Abnormal Finding Date -  Confirmed Diagnosis Date -  Diagnosis Status -  Phase of Treatment -  Chemotherapy Actual Start Date: -  Expected Surgery Date -  Navigator Follow Up Date: 07/26/2021  Navigator Follow Up Reason: Follow-up Appointment  Navigator Location [facility]  Referral Date to RadOnc/MedOnc -  Navigator Encounter Type Follow-up Appt  Telephone -  Treatment Initiated Date -  Patient Visit Type MedOnc  Treatment Phase Active Tx  Barriers/Navigation Needs Coordination of Care;Education  Education -  Interventions None Required  Acuity Level 1-No Barriers  Referrals -  Coordination of Care -  Education Method -  Support Groups/Services Friends and Family  Time Spent with Patient 15

## 2021-03-28 LAB — IGG, IGA, IGM
IgA: 13 mg/dL — ABNORMAL LOW (ref 90–386)
IgG (Immunoglobin G), Serum: 1057 mg/dL (ref 603–1613)
IgM (Immunoglobulin M), Srm: 12 mg/dL — ABNORMAL LOW (ref 20–172)

## 2021-04-16 ENCOUNTER — Telehealth: Payer: Self-pay | Admitting: Family Medicine

## 2021-04-16 NOTE — Telephone Encounter (Signed)
Patient is requesting appointment to check his blood pressure running high . His cancer doctor referred him back to [place] pressure has been 140/100 please advise

## 2021-04-16 NOTE — Telephone Encounter (Signed)
May give same day appt or appt with Barbee Shropshire. Thank you

## 2021-04-22 ENCOUNTER — Other Ambulatory Visit (HOSPITAL_COMMUNITY): Payer: Self-pay

## 2021-04-23 ENCOUNTER — Telehealth: Payer: Self-pay | Admitting: Family Medicine

## 2021-04-23 NOTE — Telephone Encounter (Signed)
Patient states his blood pressure has been elevated.  Please set him up for office visit for this week.  Go ahead and utilize one of my open slots thank you Please notify the patient accordingly thank you

## 2021-04-26 ENCOUNTER — Encounter: Payer: Self-pay | Admitting: Hematology

## 2021-04-26 NOTE — Telephone Encounter (Signed)
Schedule on 12/21

## 2021-04-29 ENCOUNTER — Other Ambulatory Visit (HOSPITAL_COMMUNITY): Payer: Self-pay

## 2021-04-30 ENCOUNTER — Encounter: Payer: Self-pay | Admitting: Hematology

## 2021-05-01 ENCOUNTER — Ambulatory Visit: Payer: Medicare Other | Admitting: Nurse Practitioner

## 2021-05-01 ENCOUNTER — Other Ambulatory Visit: Payer: Self-pay

## 2021-05-01 ENCOUNTER — Encounter: Payer: Self-pay | Admitting: Nurse Practitioner

## 2021-05-01 VITALS — BP 132/94 | HR 53 | Temp 98.4°F | Ht 69.0 in | Wt 246.8 lb

## 2021-05-01 DIAGNOSIS — I10 Essential (primary) hypertension: Secondary | ICD-10-CM | POA: Diagnosis not present

## 2021-05-01 MED ORDER — AMLODIPINE BESYLATE 2.5 MG PO TABS: 2.5000 mg | ORAL_TABLET | Freq: Every day | ORAL | 0 refills | Status: DC

## 2021-05-01 NOTE — Progress Notes (Signed)
° °  Subjective:    Patient ID: David Whitehead, male    DOB: 1970/05/25, 50 y.o.   MRN: [id number]  HPI  Patient here for BP management. Patient being followed by oncologist for CLL for ~2 years. Taking Venetoclax for CLL. Patient also has hx of acute kidney injury ~2 years ago while on drug therapy for treatment of cryptococcal meningitis.  Patient here today due to advice from his oncologist to better manage BP. Patient takes BP at home and reports Bps 130-140's  / 90-100s. Patient denies any changes to vision, headaches, chest pain, or SOB.   Review of Systems  Constitutional:  Negative for chills, fatigue and fever.  Respiratory:  Negative for chest tightness and shortness of breath.   Cardiovascular:  Negative for chest pain and palpitations.  Neurological:  Negative for weakness, light-headedness and headaches.      Objective:   Physical Exam Constitutional:      General: He is not in acute distress.    Appearance: Normal appearance. He is normal weight. He is not ill-appearing.  Neck:     Vascular: No carotid bruit.  Cardiovascular:     Rate and Rhythm: Normal rate and regular rhythm.     Pulses: Normal pulses.     Heart sounds: Normal heart sounds. No murmur heard. Pulmonary:     Effort: Pulmonary effort is normal. No respiratory distress.     Breath sounds: Normal breath sounds. No wheezing.  Musculoskeletal:        General: Normal range of motion.     Cervical back: Normal range of motion and neck supple. No rigidity or tenderness.  Lymphadenopathy:     Cervical: No cervical adenopathy.  Skin:    General: Skin is warm.     Capillary Refill: Capillary refill takes less than 2 seconds.  Neurological:     General: No focal deficit present.     Mental Status: He is alert and oriented to person, place, and time.  Psychiatric:        Mood and Affect: Mood normal.        Behavior: Behavior normal.       Assessment & Plan:   1. Primary hypertension - BP 146/102 and  132/94 on recheck. BP Goal = 130/80 - Norvasc 2.$RemoveBef'5mg'TdDNlWTDXf$  daily starting today. Follow up in 2 weeks for BP check and will check lab work and assess kidney and liver function. If patient doing well and labs WNL, will increase Norvasc to $RemoveBe'5mg'PuuCIuSKw$  at that time.  - CMP14+EGFR; Future - Lipid Panel With LDL/HDL Ratio; Future - HgB A1c; Future - Urine Microalbumin w/creat. ratio; Future

## 2021-05-08 ENCOUNTER — Other Ambulatory Visit: Payer: Self-pay | Admitting: *Deleted

## 2021-05-08 DIAGNOSIS — I10 Essential (primary) hypertension: Secondary | ICD-10-CM

## 2021-05-09 LAB — CMP14+EGFR
ALT: 60 IU/L — ABNORMAL HIGH (ref 0–44)
AST: 33 IU/L (ref 0–40)
Albumin/Globulin Ratio: 2.7 — ABNORMAL HIGH (ref 1.2–2.2)
Albumin: 4.9 g/dL (ref 4.0–5.0)
Alkaline Phosphatase: 68 IU/L (ref 44–121)
BUN/Creatinine Ratio: 14 (ref 9–20)
BUN: 16 mg/dL (ref 6–24)
Bilirubin Total: 0.4 mg/dL (ref 0.0–1.2)
CO2: 24 mmol/L (ref 20–29)
Calcium: 9.5 mg/dL (ref 8.7–10.2)
Chloride: 105 mmol/L (ref 96–106)
Creatinine, Ser: 1.13 mg/dL (ref 0.76–1.27)
Globulin, Total: 1.8 g/dL (ref 1.5–4.5)
Glucose: 110 mg/dL — ABNORMAL HIGH (ref 70–99)
Potassium: 4.8 mmol/L (ref 3.5–5.2)
Sodium: 142 mmol/L (ref 134–144)
Total Protein: 6.7 g/dL (ref 6.0–8.5)
eGFR: 80 mL/min/{1.73_m2} (ref >59)

## 2021-05-09 LAB — LIPID PANEL WITH LDL/HDL RATIO
Cholesterol, Total: 214 mg/dL — ABNORMAL HIGH (ref 100–199)
HDL: 31 mg/dL — ABNORMAL LOW (ref >39)
LDL Chol Calc (NIH): 144 mg/dL — ABNORMAL HIGH (ref 0–99)
LDL/HDL Ratio: 4.6 ratio — ABNORMAL HIGH (ref 0.0–3.6)
Triglycerides: 214 mg/dL — ABNORMAL HIGH (ref 0–149)
VLDL Cholesterol Cal: 39 mg/dL (ref 5–40)

## 2021-05-09 LAB — HEMOGLOBIN A1C
Est. average glucose Bld gHb Est-mCnc: 114 mg/dL
Hgb A1c MFr Bld: 5.6 % (ref 4.8–5.6)

## 2021-05-09 LAB — MICROALBUMIN / CREATININE URINE RATIO
Creatinine, Urine: 252.2 mg/dL
Microalb/Creat Ratio: 6 mg/g creat (ref 0–29)
Microalbumin, Urine: 13.9 ug/mL

## 2021-05-15 ENCOUNTER — Encounter: Payer: Self-pay | Admitting: Nurse Practitioner

## 2021-05-15 ENCOUNTER — Other Ambulatory Visit: Payer: Self-pay

## 2021-05-15 ENCOUNTER — Ambulatory Visit: Payer: Medicare Other | Admitting: Nurse Practitioner

## 2021-05-15 VITALS — BP 128/87 | HR 88 | Temp 98.2°F | Ht 69.0 in | Wt 240.0 lb

## 2021-05-15 DIAGNOSIS — I10 Essential (primary) hypertension: Secondary | ICD-10-CM

## 2021-05-15 DIAGNOSIS — E7800 Pure hypercholesterolemia, unspecified: Secondary | ICD-10-CM | POA: Diagnosis not present

## 2021-05-15 MED ORDER — AMLODIPINE BESYLATE 2.5 MG PO TABS
5.0000 mg | ORAL_TABLET | Freq: Every day | ORAL | 0 refills | Status: DC
Start: 2021-05-15 — End: 2021-05-15

## 2021-05-15 MED ORDER — AMLODIPINE BESYLATE 2.5 MG PO TABS: 5.0000 mg | ORAL_TABLET | Freq: Every day | ORAL | 0 refills | Status: DC

## 2021-05-15 NOTE — Progress Notes (Signed)
° °  Subjective:    Patient ID: David Whitehead, male    DOB: December 31, 1970, 51 y.o.   MRN: [id number]  HPI  Patient here for high blood pressure follow up. Patient was started on amlodipine 2.5mg  at last visit. Patient takes BP at home and states that BP run around 025-852D systolic and 78-24M diastolic. Patient denies SOB, palpations, lightheadedness, chest pain, headache, changes to vision.   Discussed recent labs with patient at today's visit. CMP WNL however, ALT slightly elevated. A1C WNL. UAC WNL. However, Lipid panel elevated, LDL=144, HDL = 31, Total Cholesterol = 214.  Patient states that he does walk and has done Pilates in the past. Patient states that he does eat at home mostly because of he is concerned about being out in public with his diagnoses of CLL.    Review of Systems    Per patient symptoms negative Objective:   Physical Exam Constitutional:      General: He is not in acute distress.    Appearance: Normal appearance. He is not ill-appearing or toxic-appearing.  Cardiovascular:     Rate and Rhythm: Normal rate and regular rhythm.     Pulses: Normal pulses.     Heart sounds: No murmur heard. Pulmonary:     Effort: Pulmonary effort is normal. No respiratory distress.     Breath sounds: No wheezing.  Musculoskeletal:        General: Normal range of motion.  Skin:    General: Skin is warm.  Neurological:     General: No focal deficit present.     Mental Status: He is alert and oriented to person, place, and time.  Psychiatric:        Mood and Affect: Mood normal.        Behavior: Behavior normal.          Assessment & Plan:  1. Primary hypertension - BP today was 150/100 and then 128/87 on recheck. Goal 130/80. Nearly at Goal.  - Will increase amlodipine to 5mg  for better BP stabilization. - RTC in 4 weeks for BP check and CMP recheck.  2. Elevated cholesterol - Lipid panel elevated: LDL=144, HDL = 31, Total Cholesterol = 214. - 10 year ASCVD score 6.2  - 9.3%. Moderate to high statin recommended.  - Patient taking Venclexta 100mg  for CLL with possible liver involvement and/or side effects - Will consult patient's Oncology team to discuss safety of starting a statin while on Venclexta. - In the meantime, patient to do lifestyle changes. Exercise with strength exercises for ~30 mins a day x3-5 a week. Eat Low fat meals, limit processed foods, consume healthy fats, limit simple sugars.  - Follow up in 4 weeks. - Will recheck lipid in 4 weeks.

## 2021-05-20 ENCOUNTER — Other Ambulatory Visit (HOSPITAL_COMMUNITY): Payer: Self-pay

## 2021-05-22 ENCOUNTER — Encounter: Payer: Self-pay | Admitting: Nurse Practitioner

## 2021-05-22 ENCOUNTER — Telehealth: Payer: Self-pay | Admitting: *Deleted

## 2021-05-22 ENCOUNTER — Encounter: Payer: Self-pay | Admitting: Family Medicine

## 2021-05-22 ENCOUNTER — Ambulatory Visit (INDEPENDENT_AMBULATORY_CARE_PROVIDER_SITE_OTHER): Payer: Medicare Other | Admitting: Nurse Practitioner

## 2021-05-22 DIAGNOSIS — U071 COVID-19: Secondary | ICD-10-CM | POA: Insufficient documentation

## 2021-05-22 DIAGNOSIS — E782 Mixed hyperlipidemia: Secondary | ICD-10-CM | POA: Diagnosis not present

## 2021-05-22 DIAGNOSIS — E785 Hyperlipidemia, unspecified: Secondary | ICD-10-CM | POA: Insufficient documentation

## 2021-05-22 MED ORDER — ROSUVASTATIN CALCIUM 5 MG PO TABS: 5.0000 mg | ORAL_TABLET | Freq: Every day | ORAL | 0 refills | Status: DC

## 2021-05-22 NOTE — Telephone Encounter (Signed)
[name], David Whitehead are scheduled for a virtual visit with your provider today.    Just as we do with appointments in the office, we must obtain your consent to participate.  Your consent will be active for this visit and any virtual visit you may have with one of our providers in the next 365 days.    If you have a MyChart account, I can also send a copy of this consent to you electronically.  All virtual visits are billed to your insurance company just like a traditional visit in the office.  As this is a virtual visit, video technology does not allow for your provider to perform a traditional examination.  This may limit your provider's ability to fully assess your condition.  If your provider identifies any concerns that need to be evaluated in person or the need to arrange testing such as labs, EKG, etc, we will make arrangements to do so.    Although advances in technology are sophisticated, we cannot ensure that it will always work on either your end or our end.  If the connection with a video visit is poor, we may have to switch to a telephone visit.  With either a video or telephone visit, we are not always able to ensure that we have a secure connection.   I need to obtain your verbal consent now.   Are you willing to proceed with your visit today?   David Whitehead has provided verbal consent on 05/22/2021 for a virtual visit (video or telephone).   Ned Card, LPN 9/50/9326  7:12 PM

## 2021-05-22 NOTE — Progress Notes (Signed)
° °  Subjective:    Patient ID: David Whitehead, male    DOB: 1970/12/29, 51 y.o.   MRN: [id number]  HPI  Virtual Visit via Telephone Note  I connected with Marcos Eke on 05/22/21 at  4:00 PM EST by telephone and verified that I am speaking with the correct person using two identifiers.  Location: Patient: home Provider: office   I discussed the limitations, risks, security and privacy concerns of performing an evaluation and management service by telephone and the availability of in person appointments. I also discussed with the patient that there may be a patient responsible charge related to this service. The patient expressed understanding and agreed to proceed.   History of Present Illness:  Patient was exposed to Covid last Tuesday (05/14/21), states he started feeling bad on Thursday (05/16/21). He says he started with a runny/stuffy nose. He says he did an at home Covid test Saturday (05/18/21), came back positive. Patient states he doesn't have much of a stuffy nose, but still has a little cough, sounds raspy and throat is sore. No shortness of breath or difficulty breathing. Denies fever, chills, body aches. Has been taking Dayquil with little relief.  Observations/Objective: No respiratory distress, SOB, or wheezing noted in conversation. Patient answered questions appropriately.   Assessment and Plan: COVID - Continue to rest and drink plenty water - May use OTC Flonase for nasal symptoms - May use OTC Mucinex (not DM) to help with cough and break up phlegm - May use saline nasal spray to think secretions - May use humidifier or steam from shower for symptoms. - Come to clinic to be evaluated if symptoms last more than ~10 days.  - Go to ED if develop SOB, wheezing, difficulty breathing.  2. Mixed Hyperlipidemia - Discussed stating statin while on Venclexta with hematology/oncology team. Safe to start statin while on Venclexta. Will monitor CMP for changes.  - 10 year  ASCVD score 6.2 - 9.3%. Moderate to high statin recommended.  - Rosuvastatin 5mg  daily sent to patient's pharmacy. 90 day supply with 0 refills.  - Will recheck labs at next scheduled in ~3 weeks. If necessary will increase dose to 10mg  at that time.   Follow Up Instructions:  I discussed the assessment and treatment plan with the patient. The patient was provided an opportunity to ask questions and all were answered. The patient agreed with the plan and demonstrated an understanding of the instructions.   The patient was advised to call back or seek an in-person evaluation if the symptoms worsen or if the condition fails to improve as anticipated.  I provided 15 minutes of non-face-to-face time during this encounter.

## 2021-05-28 ENCOUNTER — Other Ambulatory Visit (HOSPITAL_COMMUNITY): Payer: Self-pay

## 2021-06-02 ENCOUNTER — Other Ambulatory Visit: Payer: Self-pay | Admitting: Hematology & Oncology

## 2021-06-03 ENCOUNTER — Encounter: Payer: Self-pay | Admitting: Hematology

## 2021-06-05 ENCOUNTER — Other Ambulatory Visit: Payer: Self-pay

## 2021-06-05 DIAGNOSIS — I10 Essential (primary) hypertension: Secondary | ICD-10-CM

## 2021-06-05 DIAGNOSIS — E7800 Pure hypercholesterolemia, unspecified: Secondary | ICD-10-CM | POA: Diagnosis not present

## 2021-06-06 LAB — CMP14+EGFR
ALT: 68 IU/L — ABNORMAL HIGH (ref 0–44)
AST: 38 IU/L (ref 0–40)
Albumin/Globulin Ratio: 2.7 — ABNORMAL HIGH (ref 1.2–2.2)
Albumin: 4.8 g/dL (ref 4.0–5.0)
Alkaline Phosphatase: 74 IU/L (ref 44–121)
BUN/Creatinine Ratio: 17 (ref 9–20)
BUN: 18 mg/dL (ref 6–24)
Bilirubin Total: 0.4 mg/dL (ref 0.0–1.2)
CO2: 24 mmol/L (ref 20–29)
Calcium: 9.6 mg/dL (ref 8.7–10.2)
Chloride: 103 mmol/L (ref 96–106)
Creatinine, Ser: 1.09 mg/dL (ref 0.76–1.27)
Globulin, Total: 1.8 g/dL (ref 1.5–4.5)
Glucose: 103 mg/dL — ABNORMAL HIGH (ref 70–99)
Potassium: 4.2 mmol/L (ref 3.5–5.2)
Sodium: 139 mmol/L (ref 134–144)
Total Protein: 6.6 g/dL (ref 6.0–8.5)
eGFR: 84 mL/min/{1.73_m2} (ref >59)

## 2021-06-06 LAB — LIPID PANEL WITH LDL/HDL RATIO
Cholesterol, Total: 156 mg/dL (ref 100–199)
HDL: 31 mg/dL — ABNORMAL LOW (ref >39)
LDL Chol Calc (NIH): 89 mg/dL (ref 0–99)
LDL/HDL Ratio: 2.9 ratio (ref 0.0–3.6)
Triglycerides: 210 mg/dL — ABNORMAL HIGH (ref 0–149)
VLDL Cholesterol Cal: 36 mg/dL (ref 5–40)

## 2021-06-12 ENCOUNTER — Ambulatory Visit (INDEPENDENT_AMBULATORY_CARE_PROVIDER_SITE_OTHER): Payer: Medicare Other | Admitting: Nurse Practitioner

## 2021-06-12 ENCOUNTER — Encounter: Payer: Self-pay | Admitting: Nurse Practitioner

## 2021-06-12 ENCOUNTER — Other Ambulatory Visit: Payer: Self-pay

## 2021-06-12 VITALS — BP 130/86 | HR 84 | Temp 98.2°F | Ht 69.0 in | Wt 246.8 lb

## 2021-06-12 DIAGNOSIS — E782 Mixed hyperlipidemia: Secondary | ICD-10-CM

## 2021-06-12 DIAGNOSIS — I10 Essential (primary) hypertension: Secondary | ICD-10-CM

## 2021-06-12 MED ORDER — AMLODIPINE BESYLATE 2.5 MG PO TABS: 5.0000 mg | ORAL_TABLET | Freq: Every day | ORAL | 1 refills | Status: DC

## 2021-06-12 MED ORDER — ROSUVASTATIN CALCIUM 5 MG PO TABS: 5.0000 mg | ORAL_TABLET | Freq: Every day | ORAL | 1 refills | Status: DC

## 2021-06-12 NOTE — Progress Notes (Addendum)
° °Subjective:  ° ° Patient ID: David Whitehead, male    DOB: 11/29/1970, 50 y.o.   MRN: [id number] ° °HPI ° °Patient here for follow up with blood pressure and cholesterol.  At last visit patient's amlodipine was increased to 5 mg daily and patient was started on rosuvastatin for 10 year ASCVD score 6.2 - 9.3% and LDL of 144.  Today patient's blood pressure is 130/86.  Current LDL is 89. ° °Patient states that he is doing well with both rosuvastatin and amlodipine.  Patient states that he has been splitting up his 5 mg amlodipine to where he takes 2.5 mg in the morning and 2.5 mg at night.  Patient states that has been working for him.  Patient states when he was taking 5 mg of amlodipine at night he would notice that his evening blood pressure would be 107/75 while his morning blood pressure would be 136/90.  Splitting up his 5 mg dose helps his blood pressure stay even throughout the day.  Patient denies any lightheadedness, dizziness, leg swelling, muscle pain. ° °Patient states that he walks every day for physical activities.  And is working on starting Pilates again.  Patient also states that he has made some small adjustments to his diet.  Patient states that he has started snacking on cashews.  And tries to substitute meat for seafood when he can.  Triglycerides were 214 a month ago but have decreased to 210 today. ° ° °Review of Systems  °All other systems reviewed and are negative. ° °   °Objective:  ° Physical Exam °Constitutional:   °   General: He is not in acute distress. °   Appearance: Normal appearance. He is obese. He is not ill-appearing or toxic-appearing.  °HENT:  °   Head: Normocephalic.  °Cardiovascular:  °   Rate and Rhythm: Normal rate and regular rhythm.  °   Pulses: Normal pulses.  °   Heart sounds: Normal heart sounds. No murmur heard. °Pulmonary:  °   Effort: Pulmonary effort is normal. No respiratory distress.  °   Breath sounds: Normal breath sounds.  °Skin: °   General: Skin is warm.   °   Capillary Refill: Capillary refill takes less than 2 seconds.  °Neurological:  °   General: No focal deficit present.  °   Mental Status: He is alert and oriented to person, place, and time.  °Psychiatric:     °   Behavior: Behavior normal.  ° ° ° ° ° °   °Assessment & Plan:  °1. Primary hypertension °-Blood pressure today 130/86.  Goal for patient 140/90.  Goal met. °-Continue taking blood pressure medication. °-May try to take 5 mg in the morning instead of at night to ensure even blood pressure throughout the day.  However if taking 2.5 mg in the evening and at night works with schedule then thats okay too. °-Amlodipine prescription sent in with refills lasting for 180 days °- amLODipine (NORVASC) 2.5 MG tablet; Take 2 tablets (5 mg total) by mouth daily.  Dispense: 180 tablet; Refill: 1 °-Return to clinic in approximately 8 weeks for follow-up with Dr. Scott ° °2. Mixed hyperlipidemia °-LDL 89.  Goal LDL is less than 99. °- Current 10 year ASCVD risk score = <5%. °-Continue rosuvastatin 5 mg. °-Rosuvastatin 5 mg prescription sent in with refills less than for 180 days. °-Triglycerides 210.  Continue to eat well-balanced meal with plenty of lean meats and vegetables. °-Continue to exercise as tolerated. °-  tolerated. -Continue eating foods with healthy fats such as nuts, fish, avocados, olive oil -Discussed small elevation in ALT at 68 which is still around his baseline.  We will continue to monitor liver enzymes. -Return to clinic in approximately 8 weeks for follow-up with Dr. Nicki Reaper

## 2021-06-14 ENCOUNTER — Other Ambulatory Visit (HOSPITAL_COMMUNITY): Payer: Self-pay

## 2021-06-14 ENCOUNTER — Other Ambulatory Visit: Payer: Self-pay | Admitting: Hematology & Oncology

## 2021-06-14 MED ORDER — VENETOCLAX 100 MG PO TABS
ORAL_TABLET | ORAL | 6 refills | Status: DC
  Filled 2021-06-17: qty 30, 30d supply, fill #0
  Filled 2021-07-12: qty 30, 30d supply, fill #1
  Filled 2021-08-12: qty 30, 30d supply, fill #2
  Filled 2021-09-09: qty 30, 30d supply, fill #3
  Filled 2021-10-14: qty 30, 30d supply, fill #4
  Filled 2021-11-20: qty 30, 30d supply, fill #5
  Filled 2021-12-18: qty 30, 30d supply, fill #6

## 2021-06-17 ENCOUNTER — Other Ambulatory Visit (HOSPITAL_COMMUNITY): Payer: Self-pay

## 2021-06-24 ENCOUNTER — Other Ambulatory Visit (HOSPITAL_COMMUNITY): Payer: Self-pay

## 2021-07-03 ENCOUNTER — Other Ambulatory Visit: Payer: Self-pay | Admitting: Hematology & Oncology

## 2021-07-03 MED ORDER — ACYCLOVIR 400 MG PO TABS: 400.0000 mg | ORAL_TABLET | Freq: Two times a day (BID) | ORAL | 0 refills | Status: DC

## 2021-07-10 ENCOUNTER — Other Ambulatory Visit (HOSPITAL_COMMUNITY): Payer: Self-pay

## 2021-07-12 ENCOUNTER — Other Ambulatory Visit (HOSPITAL_COMMUNITY): Payer: Self-pay

## 2021-07-18 ENCOUNTER — Other Ambulatory Visit (HOSPITAL_COMMUNITY): Payer: Self-pay

## 2021-07-25 ENCOUNTER — Inpatient Hospital Stay: Payer: Medicare Other | Admitting: Family

## 2021-07-25 ENCOUNTER — Encounter: Payer: Self-pay | Admitting: *Deleted

## 2021-07-25 ENCOUNTER — Inpatient Hospital Stay: Payer: Medicare Other | Attending: Hematology & Oncology

## 2021-07-25 ENCOUNTER — Other Ambulatory Visit: Payer: Self-pay

## 2021-07-25 ENCOUNTER — Encounter: Payer: Self-pay | Admitting: Family

## 2021-07-25 VITALS — BP 136/79 | HR 87 | Temp 98.3°F | Resp 18 | Ht 69.0 in | Wt 247.4 lb

## 2021-07-25 DIAGNOSIS — Z23 Encounter for immunization: Secondary | ICD-10-CM | POA: Diagnosis not present

## 2021-07-25 DIAGNOSIS — C9110 Chronic lymphocytic leukemia of B-cell type not having achieved remission: Secondary | ICD-10-CM

## 2021-07-25 DIAGNOSIS — B451 Cerebral cryptococcosis: Secondary | ICD-10-CM | POA: Insufficient documentation

## 2021-07-25 DIAGNOSIS — D696 Thrombocytopenia, unspecified: Secondary | ICD-10-CM | POA: Diagnosis not present

## 2021-07-25 LAB — CBC WITH DIFFERENTIAL (CANCER CENTER ONLY)
Abs Immature Granulocytes: 0.04 10*3/uL (ref 0.00–0.07)
Basophils Absolute: 0 10*3/uL (ref 0.0–0.1)
Basophils Relative: 1 %
Eosinophils Absolute: 0.1 10*3/uL (ref 0.0–0.5)
Eosinophils Relative: 2 %
HCT: 45.4 % (ref 39.0–52.0)
Hemoglobin: 15.2 g/dL (ref 13.0–17.0)
Immature Granulocytes: 1 %
Lymphocytes Relative: 23 %
Lymphs Abs: 1.4 10*3/uL (ref 0.7–4.0)
MCH: 27.6 pg (ref 26.0–34.0)
MCHC: 33.5 g/dL (ref 30.0–36.0)
MCV: 82.4 fL (ref 80.0–100.0)
Monocytes Absolute: 0.5 10*3/uL (ref 0.1–1.0)
Monocytes Relative: 8 %
Neutro Abs: 4.2 10*3/uL (ref 1.7–7.7)
Neutrophils Relative %: 65 %
Platelet Count: 152 10*3/uL (ref 150–400)
RBC: 5.51 MIL/uL (ref 4.22–5.81)
RDW: 12.4 % (ref 11.5–15.5)
WBC Count: 6.4 10*3/uL (ref 4.0–10.5)
nRBC: 0 % (ref 0.0–0.2)

## 2021-07-25 LAB — CMP (CANCER CENTER ONLY)
ALT: 55 U/L — ABNORMAL HIGH (ref 0–44)
AST: 27 U/L (ref 15–41)
Albumin: 4.5 g/dL (ref 3.5–5.0)
Alkaline Phosphatase: 66 U/L (ref 38–126)
Anion gap: 7 (ref 5–15)
BUN: 17 mg/dL (ref 6–20)
CO2: 27 mmol/L (ref 22–32)
Calcium: 9.5 mg/dL (ref 8.9–10.3)
Chloride: 108 mmol/L (ref 98–111)
Creatinine: 1.06 mg/dL (ref 0.61–1.24)
GFR, Estimated: >60 mL/min (ref >60)
Glucose, Bld: 135 mg/dL — ABNORMAL HIGH (ref 70–99)
Potassium: 4 mmol/L (ref 3.5–5.1)
Sodium: 142 mmol/L (ref 135–145)
Total Bilirubin: 0.5 mg/dL (ref 0.3–1.2)
Total Protein: 6.5 g/dL (ref 6.5–8.1)

## 2021-07-25 LAB — LACTATE DEHYDROGENASE: LDH: 163 U/L (ref 98–192)

## 2021-07-25 NOTE — Progress Notes (Signed)
Oncology Nurse Navigator Documentation ? ?Oncology Nurse Navigator Flowsheets 07/25/2021  ?Abnormal Finding Date -  ?Confirmed Diagnosis Date -  ?Diagnosis Status -  ?Phase of Treatment -  ?Chemotherapy Actual Start Date: -  ?Expected Surgery Date -  ?Navigator Follow Up Date: 11/25/2021  ?Navigator Follow Up Reason: Follow-up Appointment  ?Navigator Location [facility]  ?Referral Date to RadOnc/MedOnc -  ?Navigator Encounter Type Appt/Treatment Plan Review  ?Telephone -  ?Treatment Initiated Date -  ?Patient Visit Type MedOnc  ?Treatment Phase Active Tx  ?Barriers/Navigation Needs Coordination of Care;Education  ?Education -  ?Interventions None Required  ?Acuity Level 1-No Barriers  ?Referrals -  ?Coordination of Care -  ?Education Method -  ?Support Groups/Services Friends and Family  ?Time Spent with Patient 15  ?  ?

## 2021-07-25 NOTE — Progress Notes (Signed)
?Hematology and Oncology Follow Up Visit ? ?David Whitehead ?[id number] ?1970/11/30 51 y.o. ?07/25/2021 ? ? 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  ?Current Therapy:        ?Diflucan 200 mg PO daily ?Venetoclax 100 mg PO q day -- start on 01/24/2020    ?  ?Interim History:  David Whitehead is here today for follow-up. He continues to do well but notes some fatigue at times.  ?He is tolerating Venetoclax nicely and will continue his same dose and regimen.  ?No fever, chills, n/v, cough, rash, dizziness, SOB, chest pain, palpitations, abdominal pain or changes in bowel or bladder habits.  ?No swelling, tenderness, numbness or tingling in his extremities at this time. He will sometimes have puffiness in his ankles at the end of the day. Pedal pulses are 2+.  ?No falls or syncope to report.  ?He has maintained a good appetite and is staying well hydrated. His weight is stable at 247 lbs.  ? ?ECOG Performance Status: 1 - Symptomatic but completely ambulatory ? ?Medications:  ?Allergies as of 07/25/2021   ? ?   Reactions  ? Bactrim [sulfamethoxazole-trimethoprim] Rash  ? Doxycycline Calcium Itching  ? "felt like pins and needles were in face after one dose."  ? Latex Other (See Comments)  ? Took skin off when removed tape  ? Allopurinol Itching, Rash  ? Amoxicillin Rash  ? Has patient had a PCN reaction causing immediate rash, facial/tongue/throat swelling, SOB or lightheadedness with hypotension: No ?Has patient had a PCN reaction causing severe rash involving mucus membranes or skin necrosis: No ?Has patient had a PCN reaction that required hospitalization: No ?Has patient had a PCN reaction occurring within the last 10 years: No ?If all of the above answers are "NO", then may proceed with Cephalosporin use.  ? ?  ? ?  ?Medication List  ?  ? ?  ? Accurate as of July 25, 2021 11:12 AM. If you have any questions, ask your nurse or doctor.  ?  ?  ? ?  ? ?acyclovir  400 MG tablet ?Commonly known as: ZOVIRAX ?Take 1 tablet (400 mg total) by mouth 2 (two) times daily. ?  ?amLODipine 2.5 MG tablet ?Commonly known as: NORVASC ?Take 2 tablets (5 mg total) by mouth daily. ?  ?cetirizine 10 MG tablet ?Commonly known as: ZYRTEC ?Take 10 mg by mouth daily. ?  ?fluconazole 200 MG tablet ?Commonly known as: DIFLUCAN ?Take 1 tablet (200 mg total) by mouth daily. ?  ?prenatal multivitamin Tabs tablet ?Take 1 tablet by mouth daily at 12 noon. ?  ?rosuvastatin 5 MG tablet ?Commonly known as: Crestor ?Take 1 tablet (5 mg total) by mouth daily. ?  ?Venclexta 100 MG tablet ?Generic drug: venetoclax ?TAKE 1 TABLET (100 MG) BY MOUTH DAILY. ?  ? ?  ? ? ?Allergies:  ?Allergies  ?Allergen Reactions  ? Bactrim [Sulfamethoxazole-Trimethoprim] Rash  ? Doxycycline Calcium Itching  ?  "felt like pins and needles were in face after one dose."  ? Latex Other (See Comments)  ?  Took skin off when removed tape  ? Allopurinol Itching and Rash  ? Amoxicillin Rash  ?  Has patient had a PCN reaction causing immediate rash, facial/tongue/throat swelling, SOB or lightheadedness with hypotension: No ?Has patient had a PCN reaction causing severe rash involving mucus membranes or skin necrosis: No ?Has patient had a PCN reaction that required hospitalization: No ?Has patient had a PCN reaction occurring  within the last 10 years: No ?If all of the above answers are "NO", then may proceed with Cephalosporin use. ?  ? ? ?Past Medical History, Surgical history, Social history, and Family History were reviewed and updated. ? ?Review of Systems: ?All other 10 point review of systems is negative.  ? ?Physical Exam: ? height is '5\' 9"'$  (1.753 m) and weight is 247 lb 6.4 oz (112.2 kg). His oral temperature is 98.3 ?F (36.8 ?C). His blood pressure is 136/79 and his pulse is 87. His respiration is 18 and oxygen saturation is 100%.  ? ?Wt Readings from Last 3 Encounters:  ?07/25/21 247 lb 6.4 oz (112.2 kg)  ?06/12/21 246 lb 12.8  oz (111.9 kg)  ?05/15/21 240 lb (108.9 kg)  ? ? ?Ocular: Sclerae unicteric, pupils equal, round and reactive to light ?Ear-nose-throat: Oropharynx clear, dentition fair ?Lymphatic: No cervical or supraclavicular adenopathy ?Lungs no rales or rhonchi, good excursion bilaterally ?Heart regular rate and rhythm, no murmur appreciated ?Abd soft, nontender, positive bowel sounds ?MSK no focal spinal tenderness, no joint edema ?Neuro: non-focal, well-oriented, appropriate affect ?Breasts: Deferred  ? ?Lab Results  ?Component Value Date  ? WBC 6.4 07/25/2021  ? HGB 15.2 07/25/2021  ? HCT 45.4 07/25/2021  ? MCV 82.4 07/25/2021  ? PLT 152 07/25/2021  ? ?Lab Results  ?Component Value Date  ? FERRITIN 42 11/02/2019  ? IRON 48 11/02/2019  ? TIBC 329 11/02/2019  ? UIBC 280 11/02/2019  ? IRONPCTSAT 15 (L) 11/02/2019  ? ?Lab Results  ?Component Value Date  ? RBC 5.51 07/25/2021  ? ?Lab Results  ?Component Value Date  ? KPAFRELGTCHN 82.6 (H) 01/02/2020  ? LAMBDASER 4.3 (L) 01/02/2020  ? KAPLAMBRATIO 19.21 (H) 01/02/2020  ? ?Lab Results  ?Component Value Date  ? IGGSERUM 1,057 03/27/2021  ? IGA 13 (L) 03/27/2021  ? IGMSERUM 12 (L) 03/27/2021  ? ?Lab Results  ?Component Value Date  ? TOTALPROTELP 5.9 (L) 11/21/2020  ? ALBUMINELP 3.9 11/21/2020  ? A1GS 0.1 11/21/2020  ? A2GS 0.5 11/21/2020  ? BETS 0.9 11/21/2020  ? GAMS 0.5 11/21/2020  ? MSPIKE 0.2 (H) 11/21/2020  ? ?  Chemistry   ?   ?Component Value Date/Time  ? NA 139 06/05/2021 0950  ? K 4.2 06/05/2021 0950  ? CL 103 06/05/2021 0950  ? CO2 24 06/05/2021 0950  ? BUN 18 06/05/2021 0950  ? CREATININE 1.09 06/05/2021 0950  ? CREATININE 1.14 03/27/2021 1135  ? CREATININE 1.07 02/26/2021 1130  ?    ?Component Value Date/Time  ? CALCIUM 9.6 06/05/2021 0950  ? ALKPHOS 74 06/05/2021 0950  ? AST 38 06/05/2021 0950  ? AST 26 03/27/2021 1135  ? ALT 68 (H) 06/05/2021 0950  ? ALT 48 (H) 03/27/2021 1135  ? BILITOT 0.4 06/05/2021 0950  ? BILITOT 0.6 03/27/2021 1135  ?  ? ? ? ?Impression and Plan:  David Whitehead is a very pleasant 51 yo gentleman with an aggressive CLL by virtue of the cytogenetics.  ?No changes to his regimen with Venetoclax.  ?Follow-up in 4 months.  ? ?Lottie Dawson, NP ?3/16/202311:12 AM ? ?

## 2021-07-26 ENCOUNTER — Inpatient Hospital Stay: Payer: Medicare Other | Admitting: Family

## 2021-07-26 ENCOUNTER — Inpatient Hospital Stay: Payer: Medicare Other

## 2021-07-26 LAB — IGG, IGA, IGM
IgA: 9 mg/dL — ABNORMAL LOW (ref 90–386)
IgG (Immunoglobin G), Serum: 753 mg/dL (ref 603–1613)
IgM (Immunoglobulin M), Srm: <5 mg/dL — ABNORMAL LOW (ref 20–172)

## 2021-07-30 ENCOUNTER — Encounter: Payer: Self-pay | Admitting: Family Medicine

## 2021-07-30 DIAGNOSIS — E782 Mixed hyperlipidemia: Secondary | ICD-10-CM

## 2021-07-30 NOTE — Telephone Encounter (Signed)
Nurses-please order lipid profile and send Richardson Landry a message to complete this before his follow-up visit thanks-Dr. Nicki Reaper ?

## 2021-08-02 DIAGNOSIS — E782 Mixed hyperlipidemia: Secondary | ICD-10-CM | POA: Diagnosis not present

## 2021-08-03 LAB — LIPID PANEL
Chol/HDL Ratio: 4 ratio (ref 0.0–5.0)
Cholesterol, Total: 132 mg/dL (ref 100–199)
HDL: 33 mg/dL — ABNORMAL LOW (ref >39)
LDL Chol Calc (NIH): 77 mg/dL (ref 0–99)
Triglycerides: 119 mg/dL (ref 0–149)
VLDL Cholesterol Cal: 22 mg/dL (ref 5–40)

## 2021-08-05 ENCOUNTER — Other Ambulatory Visit: Payer: Self-pay | Admitting: Hematology & Oncology

## 2021-08-06 MED ORDER — ACYCLOVIR 400 MG PO TABS
400.0000 mg | ORAL_TABLET | Freq: Two times a day (BID) | ORAL | 0 refills | Status: DC
Start: 2021-08-06 — End: 2021-09-09

## 2021-08-07 ENCOUNTER — Ambulatory Visit: Payer: Medicare Other | Admitting: Family Medicine

## 2021-08-07 VITALS — BP 112/80 | HR 90 | Temp 98.2°F | Ht 69.0 in | Wt 244.6 lb

## 2021-08-07 DIAGNOSIS — I10 Essential (primary) hypertension: Secondary | ICD-10-CM

## 2021-08-07 DIAGNOSIS — E782 Mixed hyperlipidemia: Secondary | ICD-10-CM | POA: Diagnosis not present

## 2021-08-07 MED ORDER — ROSUVASTATIN CALCIUM 5 MG PO TABS: 5.0000 mg | ORAL_TABLET | Freq: Every day | ORAL | 1 refills | Status: DC

## 2021-08-07 MED ORDER — FLUTICASONE PROPIONATE 50 MCG/ACT NA SUSP: 2.0000 | Freq: Every day | NASAL | 6 refills | Status: AC

## 2021-08-07 MED ORDER — AMLODIPINE BESYLATE 5 MG PO TABS: 5.0000 mg | ORAL_TABLET | Freq: Every day | ORAL | 1 refills | Status: DC

## 2021-08-07 NOTE — Progress Notes (Signed)
? ?  Subjective:  ? ? Patient ID: David Whitehead, male    DOB: 11/11/1970, 51 y.o.   MRN: [id number] ? ?HPI ? ?Patient here for 8 week follow up. ?Patient has underlying issues with blood pressure and hyperlipidemia ?Try to do the best he can with eating healthy ?Fitting in walking on a regular basis ?Takes his medicine without trouble ?He is being treated for CLL ?His counts have been doing well ?Not having any setbacks ?Review of Systems ? ?   ?Objective:  ? Physical Exam ?General-in no acute distress ?Eyes-no discharge ?Lungs-respiratory rate normal, CTA ?CV-no murmurs,RRR ?Extremities skin warm dry no edema ?Neuro grossly normal ?Behavior normal, alert ? ? ?Blood pressure recheck good ? ?   ?Assessment & Plan:  ?1. Primary hypertension ?Blood pressure good control 5 mg amlodipine daily currently has 2.5 that he is taking 2 at a time when he finishes these up he will get the 5 mg follow-up in 6 months ?- amLODipine (NORVASC) 5 MG tablet; Take 1 tablet (5 mg total) by mouth daily.  Dispense: 90 tablet; Refill: 1 ? ?2. Mixed hyperlipidemia ?LDL much better liver enzyme slightly elevated safe to take medication.  Continue medicine.  Relook at labs in 6 months. ? ?As for colonoscopy it is my opinion he needs to have this every 5 years he states he was told every 10 but his dad had colon cancer. ? ? ?

## 2021-08-08 ENCOUNTER — Other Ambulatory Visit (HOSPITAL_COMMUNITY): Payer: Self-pay

## 2021-08-12 ENCOUNTER — Other Ambulatory Visit (HOSPITAL_COMMUNITY): Payer: Self-pay

## 2021-08-19 ENCOUNTER — Other Ambulatory Visit (HOSPITAL_COMMUNITY): Payer: Self-pay

## 2021-08-27 ENCOUNTER — Encounter: Payer: Self-pay | Admitting: Infectious Disease

## 2021-08-27 ENCOUNTER — Ambulatory Visit: Payer: Medicare Other | Admitting: Infectious Disease

## 2021-08-27 ENCOUNTER — Other Ambulatory Visit: Payer: Self-pay

## 2021-08-27 VITALS — BP 146/96 | HR 80 | Temp 97.9°F | Resp 16 | Ht 69.0 in | Wt 251.0 lb

## 2021-08-27 DIAGNOSIS — B451 Cerebral cryptococcosis: Secondary | ICD-10-CM

## 2021-08-27 DIAGNOSIS — L988 Other specified disorders of the skin and subcutaneous tissue: Secondary | ICD-10-CM | POA: Diagnosis not present

## 2021-08-27 DIAGNOSIS — Z982 Presence of cerebrospinal fluid drainage device: Secondary | ICD-10-CM | POA: Diagnosis not present

## 2021-08-27 DIAGNOSIS — C9110 Chronic lymphocytic leukemia of B-cell type not having achieved remission: Secondary | ICD-10-CM

## 2021-08-27 HISTORY — DX: Presence of cerebrospinal fluid drainage device: Z98.2

## 2021-08-27 NOTE — Progress Notes (Signed)
? ?Subjective:  ? ?Chief complaint area of induration and erythema in his abdomen that correlates with his shunt ? ? Patient ID: David Whitehead, male    DOB: [birth year], 51 y.o.   MRN: [id number] ? ?HPI  ? ?David Whitehead is a 51 year old Caucasian male who was on Imbruvica for CLL and then developed severe cryptococcal meningitis.  He underwent VP shunt placement by David Whitehead while in the hospital for control of his high intracranial pressures.  He completed nearly a month of amphotericin with flucytosine.  Towards the end of his month of therapy he did develop a drug rash and acute kidney injury both attributed to the amphotericin.  He was given prednisone for 3 days and still had the rash at discharge. ?  ?He had follow-up labs as an outpatient with his primary care physician which showed his creatinine was still around the same range as it was when he was discharged from the hospital.  He thought the rash was getting worse but actually since he was seen by the PCP it is actually been improving. ?  ?Per David Whitehead the rash was largely on his flank and torso but extended to his extremities when it was at its worse. ?  ?The patient confirmed this and showed me that the rash had disappeared from his lower extremities and his back.  It also dissappeared from his torso.  T ?  ?I last saw him in clinic in early November 2020 and he was having result resolution of this rash. ? ?He  remains currently on venotoclax with Dr. Marin Whitehead for his CLL. ? ?He has no headaches or symptoms that would be concerning for recurrence of his cryptococcal meningitis. ? ?He did have a recent outbreak of zoster several visits ago ? ?He been feeling relatively well recently though he does suffer from fatigue which he attributes to his chemotherapy. ? ?He had some concern for tick borne infection that we addressed at prior visit ?Also discussed the idea of coming off the fluconazole since he has had adequate treatment based on guidelines but  he wanted to stay on it fand still desires this. The venotoclax  is not as perilous for fungal infection but likely does put him at some risk and he wants to remain on the azole ? ? ? ?He remains on fluconazole and continues on venotoclax--the latter requiring less of a dose due to drug drug interaction with fluconazole. ? ?He has noticed an area on his abdomen in the past several weeks that is erythematous but not esp tender but seems to track along where his VP shunt tracks. ? ? ? ?Past Medical History:  ?Diagnosis Date  ? Cancer [facility])   ? Chronic headache   ? CLL (chronic lymphocytic leukemia) ([place])   ? Diplopia   ? Edema 03/14/2019  ? Hypertension   ? Leukocytosis 04/01/2018  ? Lower extremity edema 03/14/2019  ? Molluscum contagiosum 10/26/2019  ? Myalgia 09/17/2020  ? 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 Sleep apnea   ? uses a cpap  ? Tick bite 09/17/2020  ? ? ?Past Surgical History:  ?Procedure Laterality Date  ? AXILLARY LYMPH NODE DISSECTION Right 04/16/2018  ? Procedure: AXILLARY LYMPH NODE DEEP EXCISION;  Surgeon: David Skates, MD;  Location: [facility];  Service: General;  Laterality: Right;  ? COLONOSCOPY N/A 04/01/2019  ? Procedure: COLONOSCOPY;  Surgeon: David Binder, MD;  [facility];  Service: Endoscopy;  Laterality: N/A;  12:45pm  ? ESOPHAGOGASTRODUODENOSCOPY N/A  04/01/2019  ? Procedure: ESOPHAGOGASTRODUODENOSCOPY (EGD);  Surgeon: David Binder, MD;  [facility];  Service: Endoscopy;  Laterality: N/A;  ? FINGER SURGERY    ? VENTRICULOPERITONEAL SHUNT Right 02/18/2019  ? Procedure: SHUNT INSERTION VENTRICULAR-PERITONEAL;  Surgeon: David Pall, MD;  Location: [facility];  Service: Neurosurgery;  Laterality: Right;  SHUNT INSERTION VENTRICULAR-PERITONEAL  ? ? ?Family History  ?Problem Relation Age of Onset  ? Colon cancer Father 64  ?     passed from [facility]  ? Colon cancer Paternal Uncle 90  ?     passed from [place]  ? ? ?  ?Social History  ? ?Socioeconomic History  ? Marital status: Married  ?  Spouse  name: David Whitehead  ? Number of children: 0  ? Years of education: 82  ? Highest education level: Not on file  ?Occupational History  ? Not on file  ?Tobacco Use  ? Smoking status: Never  ? Smokeless tobacco: Never  ?Vaping Use  ? Vaping Use: Never used  ?Substance and Sexual Activity  ? Alcohol use: Not Currently  ? Drug use: Never  ? Sexual activity: Yes  ?Other Topics Concern  ? Not on file  ?Social History Narrative  ? Right handed   ? Lives with wife, David Whitehead  ? Caffeine use: rare  ? ?Social Determinants of Health  ? ?Financial Resource Strain: Not on file  ?Food Insecurity: Not on file  ?Transportation Needs: Not on file  ?Physical Activity: Not on file  ?Stress: Not on file  ?Social Connections: Not on file  ? ? ?Allergies  ?Allergen Reactions  ? Bactrim [Sulfamethoxazole-Trimethoprim] Rash  ? Doxycycline Calcium Itching  ?  "felt like pins and needles were in face after one dose."  ? Latex Other (See Comments)  ?  Took skin off when removed tape  ? Allopurinol Itching and Rash  ? Amoxicillin Rash  ?  Has patient had a PCN reaction causing immediate rash, facial/tongue/throat swelling, SOB or lightheadedness with hypotension: No ?Has patient had a PCN reaction causing severe rash involving mucus membranes or skin necrosis: No ?Has patient had a PCN reaction that required hospitalization: No ?Has patient had a PCN reaction occurring within the last 10 years: No ?If all of the above answers are "NO", then may proceed with Cephalosporin use. ?  ? ? ? ?Current Outpatient Medications:  ?  acyclovir (ZOVIRAX) 400 MG tablet, Take 1 tablet (400 mg total) by mouth 2 (two) times daily., Disp: 60 tablet, Rfl: 0 ?  amLODipine (NORVASC) 5 MG tablet, Take 1 tablet (5 mg total) by mouth daily., Disp: 90 tablet, Rfl: 1 ?  cetirizine (ZYRTEC) 10 MG tablet, Take 10 mg by mouth daily., Disp: , Rfl:  ?  fluconazole (DIFLUCAN) 200 MG tablet, Take 1 tablet (200 mg total) by mouth daily., Disp: 30 tablet, Rfl: 10 ?  fluticasone (FLONASE) 50  MCG/ACT nasal spray, Place 2 sprays into both nostrils daily., Disp: 16 g, Rfl: 6 ?  Prenatal Vit-Fe Fumarate-FA (PRENATAL MULTIVITAMIN) TABS tablet, Take 1 tablet by mouth daily at 12 noon., Disp: , Rfl:  ?  rosuvastatin (CRESTOR) 5 MG tablet, Take 1 tablet (5 mg total) by mouth daily., Disp: 90 tablet, Rfl: 1 ?  venetoclax (VENCLEXTA) 100 MG tablet, TAKE 1 TABLET (100 MG) BY MOUTH DAILY., Disp: 30 tablet, Rfl: 6 ? ? ?Review of Systems  ?Constitutional:  Positive for fatigue. Negative for activity change, appetite change, chills, fever and unexpected weight change.  ?HENT:  Negative  for congestion, rhinorrhea, sinus pressure, sneezing, sore throat and trouble swallowing.   ?Eyes:  Negative for photophobia and visual disturbance.  ?Respiratory:  Negative for cough, chest tightness, shortness of breath, wheezing and stridor.   ?Cardiovascular:  Negative for chest pain, palpitations and leg swelling.  ?Gastrointestinal:  Negative for abdominal distention, abdominal pain, anal bleeding, blood in stool, constipation, diarrhea, nausea and vomiting.  ?Genitourinary:  Negative for difficulty urinating, dysuria, flank pain and hematuria.  ?Musculoskeletal:  Negative for arthralgias, back pain, gait problem, joint swelling and myalgias.  ?Skin:  Positive for rash. Negative for color change, pallor and wound.  ?Neurological:  Negative for dizziness, tremors, weakness and light-headedness.  ?Hematological:  Negative for adenopathy. Does not bruise/bleed easily.  ?Psychiatric/Behavioral:  Negative for agitation, behavioral problems, confusion, decreased concentration, dysphoric mood and sleep disturbance.   ? ?   ?Objective:  ? Physical Exam ?Constitutional:   ?   Appearance: He is well-developed.  ?HENT:  ?   Head: Normocephalic and atraumatic.  ?Eyes:  ?   Conjunctiva/sclera: Conjunctivae normal.  ?Cardiovascular:  ?   Rate and Rhythm: Normal rate and regular rhythm.  ?Pulmonary:  ?   Effort: Pulmonary effort is normal. No  respiratory distress.  ?   Breath sounds: No wheezing.  ?Abdominal:  ?   General: There is no distension.  ?   Palpations: Abdomen is soft.  ?Musculoskeletal:     ?   General: No tenderness. Normal range

## 2021-08-30 ENCOUNTER — Ambulatory Visit (HOSPITAL_BASED_OUTPATIENT_CLINIC_OR_DEPARTMENT_OTHER)
Admission: RE | Admit: 2021-08-30 | Discharge: 2021-08-30 | Disposition: A | Discharge status: Home / Self Care | Payer: Medicare Other | Source: Ambulatory Visit | Attending: Infectious Disease | Admitting: Infectious Disease

## 2021-08-30 DIAGNOSIS — Z982 Presence of cerebrospinal fluid drainage device: Secondary | ICD-10-CM | POA: Diagnosis not present

## 2021-08-30 DIAGNOSIS — L988 Other specified disorders of the skin and subcutaneous tissue: Secondary | ICD-10-CM | POA: Diagnosis not present

## 2021-08-30 DIAGNOSIS — K760 Fatty (change of) liver, not elsewhere classified: Secondary | ICD-10-CM | POA: Insufficient documentation

## 2021-08-30 MED ORDER — IOHEXOL 300 MG/ML  SOLN
100.0000 mL | Freq: Once | INTRAMUSCULAR | Status: AC | PRN
  Administered 2021-08-30: 100 mL via INTRAVENOUS

## 2021-09-02 ENCOUNTER — Telehealth: Payer: Self-pay

## 2021-09-02 NOTE — Telephone Encounter (Signed)
Patient scheduled with Dr.Cabbell on Monday May 15th at 11:45 AM. Patient aware and given number to [facility] office if he needs to reschedule. ? ? ? ?

## 2021-09-02 NOTE — Telephone Encounter (Signed)
-----   Message from Truman Hayward, MD sent at 09/01/2021  8:32 PM EDT ----- ?Regarding: FW: ?Ct scan re assuring but can we connect w Dr Christella Noa to make sure PT seen? ?----- Message ----- ?From: Interface, Rad Results In ?Sent: 09/01/2021   3:25 AM EDT ?To: Truman Hayward, MD ? ?

## 2021-09-02 NOTE — Telephone Encounter (Signed)
I spoke to the patient and relayed CT results. Patient stated he only seen Dr. Christella Noa in the hospital and never followed up with him in office. I have reached out to Dr. Lacy Duverney office, left a message for the [place] for her to give office a call back so the  patient can be scheduled with Dr. Christella Noa to discuss the patient's concerns regarding the shunt.  ?Keyairra Kolinski T Marlies Ligman ? ?

## 2021-09-09 ENCOUNTER — Encounter: Payer: Self-pay | Admitting: Infectious Disease

## 2021-09-09 ENCOUNTER — Other Ambulatory Visit: Payer: Self-pay | Admitting: Hematology & Oncology

## 2021-09-09 ENCOUNTER — Other Ambulatory Visit (HOSPITAL_COMMUNITY): Payer: Self-pay

## 2021-09-09 MED ORDER — ACYCLOVIR 400 MG PO TABS
400.0000 mg | ORAL_TABLET | Freq: Two times a day (BID) | ORAL | 6 refills | Status: DC
Start: 2021-09-09 — End: 2022-04-07

## 2021-09-09 NOTE — Telephone Encounter (Signed)
Relayed message from Dr. Tommy Medal to David Whitehead. Patient verbalized understaing and will contact Dr. Lacy Duverney office ASAP. ?David Whitehead ? ?

## 2021-09-11 DIAGNOSIS — T85730A Infection and inflammatory reaction due to ventricular intracranial (communicating) shunt, initial encounter: Secondary | ICD-10-CM | POA: Diagnosis not present

## 2021-09-12 ENCOUNTER — Encounter (HOSPITAL_COMMUNITY): Payer: Self-pay | Admitting: Neurosurgery

## 2021-09-12 ENCOUNTER — Other Ambulatory Visit: Payer: Self-pay | Admitting: Neurosurgery

## 2021-09-12 ENCOUNTER — Other Ambulatory Visit: Payer: Self-pay

## 2021-09-12 NOTE — Progress Notes (Incomplete)
David Whitehead denies chest pain or shortness of breath.Patient denies having any s/s of Covid in his household.  Patient denies any known exposure to Covid.  ? ?David Whitehead's PCP is Dr. [name]. ?Oncologist is Dr. [name]. ? ?I instructed   David Whitehead  to shower with antibacteria soap.  No nail polish, artificial or acrylic nails. Wear clean clothes, brush your teeth. ?Glasses, contact lens,dentures or partials may not be worn in the OR. If you need to wear them, please bring a case for glasses, do not wear contacts or bring a case, the hospital does not have contact cases, dentures or partials will have to be removed , make sure they are clean, we will provide a denture cup to put them in. You will need some one to drive you home and a responsible person over the age of 41 to stay with you for the first 24 hours after surgery.  ?

## 2021-09-12 NOTE — Anesthesia Preprocedure Evaluation (Addendum)
Anesthesia Evaluation  ?Patient identified by MRN, date of birth, ID band ?Patient awake ? ? ? ?Reviewed: ?Allergy & Precautions, NPO status , Patient's Chart, lab work & pertinent test results ? ?Airway ?Mallampati: III ? ?TM Distance: >3 FB ?Neck ROM: Full ? ? ? Dental ?no notable dental hx. ? ?  ?Pulmonary ?sleep apnea and Continuous Positive Airway Pressure Ventilation ,  ?  ?Pulmonary exam normal ? ? ? ? ? ? ? Cardiovascular ?hypertension, Pt. on medications ? ?Rhythm:Regular Rate:Normal ? ? ?  ?Neuro/Psych ? Headaches, VP shunt infection ?negative psych ROS  ? GI/Hepatic ?negative GI ROS, Neg liver ROS,   ?Endo/Other  ?negative endocrine ROS ? Renal/GU ?Renal disease  ?negative genitourinary ?  ?Musculoskeletal ?negative musculoskeletal ROS ?(+)  ? Abdominal ?Normal abdominal exam  (+)   ?Peds ? Hematology ? ?(+) Blood dyscrasia, anemia , CLL   ?Anesthesia Other Findings ? ? Reproductive/Obstetrics ? ?  ? ? ? ? ? ? ? ? ? ? ? ? ? ?  ?  ? ? ? ? ? ? ?Anesthesia Physical ?Anesthesia Plan ? ?ASA: 3 ? ?Anesthesia Plan: General  ? ?Post-op Pain Management:   ? ?Induction: Intravenous ? ?PONV Risk Score and Plan: 2 and Ondansetron, Dexamethasone, Midazolam and Treatment may vary due to age or medical condition ? ?Airway Management Planned: Mask and Oral ETT ? ?Additional Equipment: None ? ?Intra-op Plan:  ? ?Post-operative Plan: Extubation in OR ? ?Informed Consent: I have reviewed the patients History and Physical, chart, labs and discussed the procedure including the risks, benefits and alternatives for the proposed anesthesia with the patient or authorized representative who has indicated his/her understanding and acceptance.  ? ? ? ?Dental advisory given ? ?Plan Discussed with: CRNA ? ?Anesthesia Plan Comments: (Lab Results ?     Component                Value               Date                 ?     WBC                      6.4                 07/25/2021           ?     HGB                       15.2                07/25/2021           ?     HCT                      45.4                07/25/2021           ?     MCV                      82.4                07/25/2021           ?     PLT  152                 07/25/2021           ?Lab Results ?     Component                Value               Date                 ?     NA                       142                 07/25/2021           ?     K                        4.0                 07/25/2021           ?     CO2                      27                  07/25/2021           ?     GLUCOSE                  135 (H)             07/25/2021           ?     BUN                      17                  07/25/2021           ?     CREATININE               1.06                07/25/2021           ?     CALCIUM                  9.5                 07/25/2021           ?     EGFR                     84                  06/05/2021           ?     GFRNONAA                 >60                 07/25/2021          )  ? ? ? ? ? ?Anesthesia Quick Evaluation ? ?

## 2021-09-12 NOTE — Progress Notes (Signed)
[name] denies chest pain or shortness of breath.Patient denies having any s/s of Covid in his household.  Patient denies any known exposure to Covid.  ? ?[name]'s PCP is Dr. [name]. ?Oncologist is Dr. [name]. ? ?I instructed   [name]  to shower with antibacteria soap.  No nail polish, artificial or acrylic nails. Wear clean clothes, brush your teeth. ?Glasses, contact lens,dentures or partials may not be worn in the OR. If you need to wear them, please bring a case for glasses, do not wear contacts or bring a case, the hospital does not have contact cases, dentures or partials will have to be removed , make sure they are clean, we will provide a denture cup to put them in. You will need some one to drive you home and a responsible person over the age of 8 to stay with you for the first 24 hours after surgery.  ?

## 2021-09-13 ENCOUNTER — Other Ambulatory Visit: Payer: Self-pay

## 2021-09-13 ENCOUNTER — Encounter (HOSPITAL_COMMUNITY)
Admission: RE | Disposition: A | Discharge status: Home / Self Care | Payer: Self-pay | Source: Home / Self Care | Attending: Neurosurgery

## 2021-09-13 ENCOUNTER — Observation Stay (HOSPITAL_COMMUNITY): Payer: Medicare Other

## 2021-09-13 ENCOUNTER — Inpatient Hospital Stay (HOSPITAL_COMMUNITY)
Admission: RE | Admit: 2021-09-13 | Discharge: 2021-09-14 | DRG: 032 | Disposition: A | Discharge status: Home / Self Care | Payer: Medicare Other | Attending: Neurosurgery | Admitting: Neurosurgery

## 2021-09-13 ENCOUNTER — Inpatient Hospital Stay (HOSPITAL_COMMUNITY): Payer: Medicare Other | Admitting: Anesthesiology

## 2021-09-13 ENCOUNTER — Encounter (HOSPITAL_COMMUNITY): Payer: Self-pay | Admitting: Neurosurgery

## 2021-09-13 DIAGNOSIS — Y831 Surgical operation with implant of artificial internal device as the cause of abnormal reaction of the patient, or of later complication, without mention of misadventure at the time of the procedure: Secondary | ICD-10-CM | POA: Diagnosis present

## 2021-09-13 DIAGNOSIS — I10 Essential (primary) hypertension: Secondary | ICD-10-CM

## 2021-09-13 DIAGNOSIS — T85730A Infection and inflammatory reaction due to ventricular intracranial (communicating) shunt, initial encounter: Secondary | ICD-10-CM

## 2021-09-13 DIAGNOSIS — G4733 Obstructive sleep apnea (adult) (pediatric): Secondary | ICD-10-CM | POA: Diagnosis not present

## 2021-09-13 DIAGNOSIS — I6200 Nontraumatic subdural hemorrhage, unspecified: Secondary | ICD-10-CM | POA: Diagnosis not present

## 2021-09-13 DIAGNOSIS — Z79899 Other long term (current) drug therapy: Secondary | ICD-10-CM

## 2021-09-13 DIAGNOSIS — G919 Hydrocephalus, unspecified: Secondary | ICD-10-CM | POA: Diagnosis not present

## 2021-09-13 DIAGNOSIS — G936 Cerebral edema: Secondary | ICD-10-CM | POA: Diagnosis not present

## 2021-09-13 DIAGNOSIS — I619 Nontraumatic intracerebral hemorrhage, unspecified: Secondary | ICD-10-CM | POA: Diagnosis not present

## 2021-09-13 DIAGNOSIS — Z800 Family history of malignant neoplasm of digestive organs: Secondary | ICD-10-CM

## 2021-09-13 DIAGNOSIS — Z9989 Dependence on other enabling machines and devices: Secondary | ICD-10-CM | POA: Diagnosis not present

## 2021-09-13 DIAGNOSIS — S066X0A Traumatic subarachnoid hemorrhage without loss of consciousness, initial encounter: Secondary | ICD-10-CM | POA: Diagnosis not present

## 2021-09-13 DIAGNOSIS — C9110 Chronic lymphocytic leukemia of B-cell type not having achieved remission: Secondary | ICD-10-CM | POA: Diagnosis not present

## 2021-09-13 HISTORY — PX: SHUNT REMOVAL: SHX342

## 2021-09-13 LAB — BASIC METABOLIC PANEL
Anion gap: 9 (ref 5–15)
BUN: 15 mg/dL (ref 6–20)
CO2: 22 mmol/L (ref 22–32)
Calcium: 9.4 mg/dL (ref 8.9–10.3)
Chloride: 109 mmol/L (ref 98–111)
Creatinine, Ser: 1.11 mg/dL (ref 0.61–1.24)
GFR, Estimated: >60 mL/min (ref >60)
Glucose, Bld: 121 mg/dL — ABNORMAL HIGH (ref 70–99)
Potassium: 3.8 mmol/L (ref 3.5–5.1)
Sodium: 140 mmol/L (ref 135–145)

## 2021-09-13 LAB — CBC
HCT: 46.7 % (ref 39.0–52.0)
Hemoglobin: 15.5 g/dL (ref 13.0–17.0)
MCH: 27.4 pg (ref 26.0–34.0)
MCHC: 33.2 g/dL (ref 30.0–36.0)
MCV: 82.7 fL (ref 80.0–100.0)
Platelets: 156 10*3/uL (ref 150–400)
RBC: 5.65 MIL/uL (ref 4.22–5.81)
RDW: 12.3 % (ref 11.5–15.5)
WBC: 5.3 10*3/uL (ref 4.0–10.5)
nRBC: 0 % (ref 0.0–0.2)

## 2021-09-13 LAB — MRSA NEXT GEN BY PCR, NASAL: MRSA by PCR Next Gen: NOT DETECTED

## 2021-09-13 SURGERY — SHUNT REMOVAL
Anesthesia: General

## 2021-09-13 MED ORDER — HEMOSTATIC AGENTS (NO CHARGE) OPTIME
TOPICAL | Status: DC | PRN
  Administered 2021-09-13: 1 via TOPICAL

## 2021-09-13 MED ORDER — ROSUVASTATIN CALCIUM 5 MG PO TABS
5.0000 mg | ORAL_TABLET | Freq: Every day | ORAL | Status: DC
  Administered 2021-09-13: 5 mg via ORAL
  Filled 2021-09-13: qty 1

## 2021-09-13 MED ORDER — DEXAMETHASONE SODIUM PHOSPHATE 10 MG/ML IJ SOLN
INTRAMUSCULAR | Status: AC
  Filled 2021-09-13: qty 1

## 2021-09-13 MED ORDER — PROPOFOL 10 MG/ML IV BOLUS
INTRAVENOUS | Status: AC
  Filled 2021-09-13: qty 20

## 2021-09-13 MED ORDER — FLUCONAZOLE 200 MG PO TABS
200.0000 mg | ORAL_TABLET | Freq: Every day | ORAL | Status: DC
  Administered 2021-09-13 – 2021-09-14 (×2): 200 mg via ORAL
  Filled 2021-09-13 (×2): qty 1

## 2021-09-13 MED ORDER — ONDANSETRON HCL 4 MG/2ML IJ SOLN: 4.0000 mg | INTRAMUSCULAR | Status: DC | PRN

## 2021-09-13 MED ORDER — MORPHINE SULFATE (PF) 2 MG/ML IV SOLN: 1.0000 mg | INTRAVENOUS | Status: DC | PRN

## 2021-09-13 MED ORDER — DEXAMETHASONE SODIUM PHOSPHATE 10 MG/ML IJ SOLN
INTRAMUSCULAR | Status: DC | PRN
  Administered 2021-09-13: 10 mg via INTRAVENOUS

## 2021-09-13 MED ORDER — ORAL CARE MOUTH RINSE: 15.0000 mL | Freq: Once | OROMUCOSAL | Status: AC

## 2021-09-13 MED ORDER — SUGAMMADEX SODIUM 200 MG/2ML IV SOLN
INTRAVENOUS | Status: DC | PRN
  Administered 2021-09-13: 200 mg via INTRAVENOUS

## 2021-09-13 MED ORDER — VENETOCLAX 100 MG PO TABS: 100.0000 mg | ORAL_TABLET | Freq: Every day | ORAL | Status: DC

## 2021-09-13 MED ORDER — AMLODIPINE BESYLATE 5 MG PO TABS
5.0000 mg | ORAL_TABLET | Freq: Every day | ORAL | Status: DC
  Administered 2021-09-14: 5 mg via ORAL
  Filled 2021-09-13 (×2): qty 1

## 2021-09-13 MED ORDER — ACETAMINOPHEN 10 MG/ML IV SOLN
1000.0000 mg | Freq: Once | INTRAVENOUS | Status: DC | PRN
  Administered 2021-09-13: 1000 mg via INTRAVENOUS

## 2021-09-13 MED ORDER — VANCOMYCIN HCL 1250 MG/250ML IV SOLN
1250.0000 mg | Freq: Two times a day (BID) | INTRAVENOUS | Status: DC
  Administered 2021-09-14: 1250 mg via INTRAVENOUS
  Filled 2021-09-13 (×2): qty 250

## 2021-09-13 MED ORDER — LABETALOL HCL 5 MG/ML IV SOLN: 10.0000 mg | INTRAVENOUS | Status: DC | PRN

## 2021-09-13 MED ORDER — LORATADINE 10 MG PO TABS
10.0000 mg | ORAL_TABLET | Freq: Every day | ORAL | Status: DC
Start: 2021-09-13 — End: 2021-09-14
  Administered 2021-09-13 – 2021-09-14 (×2): 10 mg via ORAL
  Filled 2021-09-13 (×2): qty 1

## 2021-09-13 MED ORDER — LACTATED RINGERS IV SOLN: INTRAVENOUS | Status: DC

## 2021-09-13 MED ORDER — ACYCLOVIR 400 MG PO TABS
400.0000 mg | ORAL_TABLET | Freq: Two times a day (BID) | ORAL | Status: DC
  Administered 2021-09-13 – 2021-09-14 (×3): 400 mg via ORAL
  Filled 2021-09-13 (×4): qty 1

## 2021-09-13 MED ORDER — POTASSIUM CHLORIDE IN NACL 20-0.9 MEQ/L-% IV SOLN
INTRAVENOUS | Status: DC
  Filled 2021-09-13 (×2): qty 1000

## 2021-09-13 MED ORDER — CHLORHEXIDINE GLUCONATE 0.12 % MT SOLN
15.0000 mL | Freq: Once | OROMUCOSAL | Status: AC
  Administered 2021-09-13: 15 mL via OROMUCOSAL
  Filled 2021-09-13: qty 15

## 2021-09-13 MED ORDER — FLUTICASONE PROPIONATE 50 MCG/ACT NA SUSP
2.0000 | Freq: Every day | NASAL | Status: DC
  Administered 2021-09-13 – 2021-09-14 (×2): 2 via NASAL
  Filled 2021-09-13: qty 16

## 2021-09-13 MED ORDER — NALOXONE HCL 0.4 MG/ML IJ SOLN: 0.0800 mg | INTRAMUSCULAR | Status: DC | PRN

## 2021-09-13 MED ORDER — MIDAZOLAM HCL 2 MG/2ML IJ SOLN
INTRAMUSCULAR | Status: AC
  Filled 2021-09-13: qty 2

## 2021-09-13 MED ORDER — PROMETHAZINE HCL 25 MG PO TABS: 12.5000 mg | ORAL_TABLET | ORAL | Status: DC | PRN

## 2021-09-13 MED ORDER — HYDROCODONE-ACETAMINOPHEN 5-325 MG PO TABS
1.0000 | ORAL_TABLET | ORAL | Status: DC | PRN
  Administered 2021-09-13: 1 via ORAL
  Filled 2021-09-13: qty 1

## 2021-09-13 MED ORDER — THROMBIN 5000 UNITS EX SOLR
CUTANEOUS | Status: DC | PRN
  Administered 2021-09-13 (×2): 5000 [IU] via TOPICAL

## 2021-09-13 MED ORDER — ONDANSETRON HCL 4 MG/2ML IJ SOLN
INTRAMUSCULAR | Status: AC
  Filled 2021-09-13: qty 2

## 2021-09-13 MED ORDER — MIDAZOLAM HCL 2 MG/2ML IJ SOLN
INTRAMUSCULAR | Status: DC | PRN
  Administered 2021-09-13: 2 mg via INTRAVENOUS

## 2021-09-13 MED ORDER — VANCOMYCIN HCL 500 MG IV SOLR
INTRAVENOUS | Status: AC
  Filled 2021-09-13: qty 10

## 2021-09-13 MED ORDER — LIDOCAINE 2% (20 MG/ML) 5 ML SYRINGE
INTRAMUSCULAR | Status: DC | PRN
  Administered 2021-09-13: 100 mg via INTRAVENOUS

## 2021-09-13 MED ORDER — PROPOFOL 10 MG/ML IV BOLUS
INTRAVENOUS | Status: DC | PRN
  Administered 2021-09-13: 10 mg via INTRAVENOUS
  Administered 2021-09-13: 200 mg via INTRAVENOUS

## 2021-09-13 MED ORDER — ROCURONIUM BROMIDE 10 MG/ML (PF) SYRINGE
PREFILLED_SYRINGE | INTRAVENOUS | Status: DC | PRN
  Administered 2021-09-13: 70 mg via INTRAVENOUS

## 2021-09-13 MED ORDER — PRENATAL MULTIVITAMIN CH
1.0000 | ORAL_TABLET | Freq: Every day | ORAL | Status: DC
  Administered 2021-09-13: 1 via ORAL
  Filled 2021-09-13 (×2): qty 1

## 2021-09-13 MED ORDER — HEPARIN SODIUM (PORCINE) 5000 UNIT/ML IJ SOLN
5000.0000 [IU] | Freq: Three times a day (TID) | INTRAMUSCULAR | Status: DC
  Administered 2021-09-13 – 2021-09-14 (×3): 5000 [IU] via SUBCUTANEOUS
  Filled 2021-09-13 (×3): qty 1

## 2021-09-13 MED ORDER — FENTANYL CITRATE (PF) 250 MCG/5ML IJ SOLN
INTRAMUSCULAR | Status: AC
  Filled 2021-09-13: qty 5

## 2021-09-13 MED ORDER — ACETAMINOPHEN 10 MG/ML IV SOLN
INTRAVENOUS | Status: AC
  Filled 2021-09-13: qty 100

## 2021-09-13 MED ORDER — THROMBIN 5000 UNITS EX SOLR
CUTANEOUS | Status: AC
  Filled 2021-09-13: qty 10000

## 2021-09-13 MED ORDER — VANCOMYCIN HCL 500 MG IV SOLR
INTRAVENOUS | Status: DC | PRN
  Administered 2021-09-13: 500 mg

## 2021-09-13 MED ORDER — ONDANSETRON HCL 4 MG/2ML IJ SOLN
INTRAMUSCULAR | Status: DC | PRN
Start: 2021-09-13 — End: 2021-09-13
  Administered 2021-09-13: 4 mg via INTRAVENOUS

## 2021-09-13 MED ORDER — LIDOCAINE 2% (20 MG/ML) 5 ML SYRINGE
INTRAMUSCULAR | Status: AC
  Filled 2021-09-13: qty 5

## 2021-09-13 MED ORDER — FENTANYL CITRATE (PF) 100 MCG/2ML IJ SOLN: 25.0000 ug | INTRAMUSCULAR | Status: DC | PRN

## 2021-09-13 MED ORDER — ROCURONIUM BROMIDE 10 MG/ML (PF) SYRINGE
PREFILLED_SYRINGE | INTRAVENOUS | Status: AC
  Filled 2021-09-13: qty 10

## 2021-09-13 MED ORDER — VANCOMYCIN HCL 10 G IV SOLR
2250.0000 mg | Freq: Once | INTRAVENOUS | Status: AC
  Administered 2021-09-13: 2250 mg via INTRAVENOUS
  Filled 2021-09-13: qty 2250

## 2021-09-13 MED ORDER — ONDANSETRON HCL 4 MG PO TABS: 4.0000 mg | ORAL_TABLET | ORAL | Status: DC | PRN

## 2021-09-13 MED ORDER — LIDOCAINE-EPINEPHRINE 0.5 %-1:200000 IJ SOLN
INTRAMUSCULAR | Status: AC
  Filled 2021-09-13: qty 1

## 2021-09-13 MED ORDER — FENTANYL CITRATE (PF) 250 MCG/5ML IJ SOLN
INTRAMUSCULAR | Status: DC | PRN
  Administered 2021-09-13 (×2): 50 ug via INTRAVENOUS
  Administered 2021-09-13: 100 ug via INTRAVENOUS
  Administered 2021-09-13: 50 ug via INTRAVENOUS

## 2021-09-13 SURGICAL SUPPLY — 55 items
BAG COUNTER SPONGE SURGICOUNT (BAG) ×2 IMPLANT
BLADE CLIPPER SURG (BLADE) ×4 IMPLANT
BOOT SUTURE AID YELLOW STND (SUTURE) ×2 IMPLANT
BUR ACORN 6.0 PRECISION (BURR) ×2 IMPLANT
CANISTER SUCT 3000ML PPV (MISCELLANEOUS) ×2 IMPLANT
CARTRIDGE OIL MAESTRO DRILL (MISCELLANEOUS) ×1 IMPLANT
CLIP RANEY DISP (INSTRUMENTS) IMPLANT
CNTNR URN SCR LID CUP LEK RST (MISCELLANEOUS) IMPLANT
CONT SPEC 4OZ STRL OR WHT (MISCELLANEOUS) ×4
DECANTER SPIKE VIAL GLASS SM (MISCELLANEOUS) ×1 IMPLANT
DIFFUSER DRILL AIR PNEUMATIC (MISCELLANEOUS) ×2 IMPLANT
DRAPE INCISE IOBAN 66X45 STRL (DRAPES) ×2 IMPLANT
DRAPE ORTHO SPLIT 77X108 STRL (DRAPES) ×4
DRAPE SURG ORHT 6 SPLT 77X108 (DRAPES) ×2 IMPLANT
DRSG OPSITE POSTOP 3X4 (GAUZE/BANDAGES/DRESSINGS) ×2 IMPLANT
DURAPREP 26ML APPLICATOR (WOUND CARE) ×4 IMPLANT
ELECT REM PT RETURN 9FT ADLT (ELECTROSURGICAL) ×2
ELECTRODE REM PT RTRN 9FT ADLT (ELECTROSURGICAL) ×1 IMPLANT
GAUZE 4X4 16PLY ~~LOC~~+RFID DBL (SPONGE) IMPLANT
GLOVE ECLIPSE 6.5 STRL STRAW (GLOVE) ×5 IMPLANT
GLOVE SURG SS PI 6.0 STRL IVOR (GLOVE) ×1 IMPLANT
GOWN STRL REUS W/ TWL LRG LVL3 (GOWN DISPOSABLE) ×2 IMPLANT
GOWN STRL REUS W/ TWL XL LVL3 (GOWN DISPOSABLE) IMPLANT
GOWN STRL REUS W/TWL 2XL LVL3 (GOWN DISPOSABLE) IMPLANT
GOWN STRL REUS W/TWL LRG LVL3 (GOWN DISPOSABLE) ×4
GOWN STRL REUS W/TWL XL LVL3 (GOWN DISPOSABLE)
HEMOSTAT SURGICEL 2X14 (HEMOSTASIS) IMPLANT
KIT BASIN OR (CUSTOM PROCEDURE TRAY) ×2 IMPLANT
KIT TURNOVER KIT B (KITS) ×2 IMPLANT
MARKER SKIN DUAL TIP RULER LAB (MISCELLANEOUS) ×2 IMPLANT
NDL HYPO 18GX1.5 BLUNT FILL (NEEDLE) IMPLANT
NDL HYPO 25X1 1.5 SAFETY (NEEDLE) ×1 IMPLANT
NEEDLE HYPO 18GX1.5 BLUNT FILL (NEEDLE) ×2 IMPLANT
NEEDLE HYPO 25X1 1.5 SAFETY (NEEDLE) ×4 IMPLANT
NS IRRIG 1000ML POUR BTL (IV SOLUTION) ×2 IMPLANT
OIL CARTRIDGE MAESTRO DRILL (MISCELLANEOUS) ×2
PACK LAMINECTOMY NEURO (CUSTOM PROCEDURE TRAY) ×2 IMPLANT
PAD ARMBOARD 7.5X6 YLW CONV (MISCELLANEOUS) ×7 IMPLANT
SPONGE SURGIFOAM ABS GEL 12-7 (HEMOSTASIS) IMPLANT
SPONGE T-LAP 4X18 ~~LOC~~+RFID (SPONGE) IMPLANT
STAPLER SKIN PROX WIDE 3.9 (STAPLE) ×2 IMPLANT
STRIP CLOSURE SKIN 1/4X4 (GAUZE/BANDAGES/DRESSINGS) IMPLANT
SUT ETHILON 3 0 PS 1 (SUTURE) ×1 IMPLANT
SUT NURALON 4 0 TR CR/8 (SUTURE) IMPLANT
SUT SILK 0 TIES 10X30 (SUTURE) IMPLANT
SUT SILK 2 0 TIES 10X30 (SUTURE) IMPLANT
SUT SILK 3 0 SH 30 (SUTURE) IMPLANT
SUT VIC AB 2-0 CT2 18 VCP726D (SUTURE) ×2 IMPLANT
SUT VIC AB 3-0 SH 8-18 (SUTURE) ×2 IMPLANT
SYR 3ML LL SCALE MARK (SYRINGE) ×1 IMPLANT
SYR CONTROL 10ML LL (SYRINGE) ×2 IMPLANT
TOWEL GREEN STERILE (TOWEL DISPOSABLE) ×2 IMPLANT
TOWEL GREEN STERILE FF (TOWEL DISPOSABLE) ×2 IMPLANT
UNDERPAD 30X36 HEAVY ABSORB (UNDERPADS AND DIAPERS) ×2 IMPLANT
WATER STERILE IRR 1000ML POUR (IV SOLUTION) ×2 IMPLANT

## 2021-09-13 NOTE — H&P (Signed)
David Whitehead is a 51 y.o. male ?Presents today for shunt removal. Approximately 10 days ago he had an abdominal wall pustule form, it opened, and the shunt was visible. He came to the office, indeed it was the shunt. He is now here for shunt removal.  ?Allergies  ?Allergen Reactions  ? Bactrim [Sulfamethoxazole-Trimethoprim] Rash  ? Doxycycline Calcium Itching  ?  "felt like pins and needles were in face after one dose."  ? Latex Other (See Comments)  ?  Took skin off when removed tape  ? Allopurinol Itching and Rash  ? Amoxicillin Rash  ?  Has patient had a PCN reaction causing immediate rash, facial/tongue/throat swelling, SOB or lightheadedness with hypotension: No ?Has patient had a PCN reaction causing severe rash involving mucus membranes or skin necrosis: No ?Has patient had a PCN reaction that required hospitalization: No ?Has patient had a PCN reaction occurring within the last 10 years: No ?If all of the above answers are "NO", then may proceed with Cephalosporin use. ?  ? ?Past Medical History:  ?Diagnosis Date  ? Cancer [facility])   ? Chronic headache   ? CLL (chronic lymphocytic leukemia) ([place])   ? Diplopia   ? Edema 03/14/2019  ? Hypertension   ? Leukocytosis 04/01/2018  ? Lower extremity edema 03/14/2019  ? Molluscum contagiosum 10/26/2019  ? Myalgia 09/17/2020  ? 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 Sleep apnea   ? uses a cpap  ? Tick bite 09/17/2020  ? ?Past Surgical History:  ?Procedure Laterality Date  ? AXILLARY LYMPH NODE DISSECTION Right 04/16/2018  ? Procedure: AXILLARY LYMPH NODE DEEP EXCISION;  Surgeon: Fanny Skates, MD;  Location: [place];  Service: General;  Laterality: Right;  ? COLONOSCOPY N/A 04/01/2019  ? Procedure: COLONOSCOPY;  Surgeon: Danie Binder, MD;  [facility];  Service: Endoscopy;  Laterality: N/A;  12:45pm  ? ESOPHAGOGASTRODUODENOSCOPY N/A 04/01/2019  ? Procedure: ESOPHAGOGASTRODUODENOSCOPY (EGD);  Surgeon: Danie Binder, MD;  [facility];  Service: Endoscopy;   Laterality: N/A;  ? FINGER SURGERY    ? VENTRICULOPERITONEAL SHUNT Right 02/18/2019  ? Procedure: SHUNT INSERTION VENTRICULAR-PERITONEAL;  Surgeon: Ashok Pall, MD;  Location: [place];  Service: Neurosurgery;  Laterality: Right;  SHUNT INSERTION VENTRICULAR-PERITONEAL  ? ?Family History  ?Problem Relation Age of Onset  ? Colon cancer Father 53  ?     passed from [facility]  ? Colon cancer Paternal Uncle 55  ?     passed from [facility]  ? ?Social History  ? ?Socioeconomic History  ? Marital status: Married  ?  Spouse name: [name]  ? Number of children: 0  ? Years of education: 53  ? Highest education level: Not on file  ?Occupational History  ? Not on file  ?Tobacco Use  ? Smoking status: Never  ? Smokeless tobacco: Never  ?Vaping Use  ? Vaping Use: Never used  ?Substance and Sexual Activity  ? Alcohol use: Not Currently  ? Drug use: Never  ? Sexual activity: Yes  ?Other Topics Concern  ? Not on file  ?Social History Narrative  ? Right handed   ? Lives with wife, [name]  ? Caffeine use: rare  ? ?Social Determinants of Health  ? ?Financial Resource Strain: Not on file  ?Food Insecurity: Not on file  ?Transportation Needs: Not on file  ?Physical Activity: Not on file  ?Stress: Not on file  ?Social Connections: Not on file  ?Intimate Partner Violence: Not on file  ? ?Prior to  Admission medications   ?Medication Sig Start Date End Date Taking? Authorizing Provider  ?acyclovir (ZOVIRAX) 400 MG tablet Take 1 tablet (400 mg total) by mouth 2 (two) times daily. 09/09/21  Yes Volanda Napoleon, MD  ?amLODipine (NORVASC) 5 MG tablet Take 1 tablet (5 mg total) by mouth daily. 08/07/21  Yes [name], MD  ?cetirizine (ZYRTEC) 10 MG tablet Take 10 mg by mouth daily.   Yes [provider]  ?fluconazole (DIFLUCAN) 200 MG tablet Take 1 tablet (200 mg total) by mouth daily. 02/26/21  Yes Tommy Medal, Lavell Islam, MD  ?fluticasone [facility]) 50 MCG/ACT nasal spray Place 2 sprays into both nostrils daily. 08/07/21  Yes [name], MD   ?Prenatal Vit-Fe Fumarate-FA (PRENATAL MULTIVITAMIN) TABS tablet Take 1 tablet by mouth daily at 12 noon.   Yes [provider]  ?rosuvastatin (CRESTOR) 5 MG tablet Take 1 tablet (5 mg total) by mouth daily. ?Patient taking differently: Take 5 mg by mouth at bedtime. 08/07/21  Yes Luking, Elayne Snare, MD  ?venetoclax (VENCLEXTA) 100 MG tablet TAKE 1 TABLET (100 MG) BY MOUTH DAILY. 06/14/21 06/14/22 Yes Volanda Napoleon, MD  ? ?No results found. ?Results for orders placed or performed during the hospital encounter of 09/13/21 (from the past 24 hour(s))  ?Basic metabolic panel per protocol     Status: Abnormal  ? Collection Time: 09/13/21  5:46 AM  ?Result Value Ref Range  ? Sodium 140 135 - 145 mmol/L  ? Potassium 3.8 3.5 - 5.1 mmol/L  ? Chloride 109 98 - 111 mmol/L  ? CO2 22 22 - 32 mmol/L  ? Glucose, Bld 121 (H) 70 - 99 mg/dL  ? BUN 15 6 - 20 mg/dL  ? Creatinine, Ser 1.11 0.61 - 1.24 mg/dL  ? Calcium 9.4 8.9 - 10.3 mg/dL  ? GFR, Estimated >60 >60 mL/min  ? Anion gap 9 5 - 15  ?CBC per protocol     Status: None  ? Collection Time: 09/13/21  5:46 AM  ?Result Value Ref Range  ? WBC 5.3 4.0 - 10.5 K/uL  ? RBC 5.65 4.22 - 5.81 MIL/uL  ? Hemoglobin 15.5 13.0 - 17.0 g/dL  ? HCT 46.7 39.0 - 52.0 %  ? MCV 82.7 80.0 - 100.0 fL  ? MCH 27.4 26.0 - 34.0 pg  ? MCHC 33.2 30.0 - 36.0 g/dL  ? RDW 12.3 11.5 - 15.5 %  ? Platelets 156 150 - 400 K/uL  ? nRBC 0.0 0.0 - 0.2 %  ? To OR for shunt removal.  ? ?

## 2021-09-13 NOTE — Op Note (Signed)
09/13/2021 ? ?9:47 AM ? ?PATIENT:  David Whitehead  51 y.o. male ?With a shunt that has eroded through the skin and is now exposed. Admitted for removal of the shunt system. ?PRE-OPERATIVE DIAGNOSIS:  Infection associated with ventricular intracranial shunt ? ?POST-OPERATIVE DIAGNOSIS:  Infection associated with ventricular intracranial shunt ? ?PROCEDURE:  Procedure(s): ?SHUNT REMOVAL ? ?SURGEON: Surgeon(s): ?Ashok Pall, MD ? ?ASSISTANTS:none ? ?ANESTHESIA:   general ? ?EBL:  Total I/O ?In: 800 [I.V.:800] ?Out: -  ? ?BLOOD ADMINISTERED:none ? ?CELL SAVER GIVEN:not used ? ?COUNT:correct ? ?DRAINS: none  ? ?SPECIMEN:  Source of Specimen:  shunt catheter, csf ? ?DICTATION: Marcos Eke was taken to the operating room, intubated, and placed under a general anesthetic without difficulty. He was positioned supine on the operating room bed. His head, neck, chest and abdomen were prepped and draped in a sterile manner. I delivered the visible shunt catheter from the skin, placed two snaps on the catheter and cut it between the two clamps. I removed the distal end of the catheter and sent it to the lab.  ?I opened the cranial end, aspirated csf, also sent to the lab. I removed the ventricular catheter, valve, and remaining tubing.  ?I closed the cranial incision in layers. I placed vancomycin powder over the abdominal wall. I irrigated the incision before placing the vancomycin powder. Dressings placed on both. He was extubated in the room and taken to the recovery room.  ? ?PLAN OF CARE: Admit for overnight observation ? ?PATIENT DISPOSITION:  PACU - hemodynamically stable. ?  ?Delay start of Pharmacological VTE agent (>24hrs) due to surgical blood loss or risk of bleeding:  no ? ?  ?

## 2021-09-13 NOTE — Progress Notes (Signed)
IR called in results to head CT, DR. Cabbell paged and made aware. ?

## 2021-09-13 NOTE — Anesthesia Procedure Notes (Signed)
Procedure Name: Intubation ?Date/Time: 09/13/2021 8:27 AM ?Performed by: Betha Loa, CRNA ?Pre-anesthesia Checklist: Patient identified, Emergency Drugs available, Suction available and Patient being monitored ?Patient Re-evaluated:Patient Re-evaluated prior to induction ?Oxygen Delivery Method: Circle System Utilized ?Preoxygenation: Pre-oxygenation with 100% oxygen ?Induction Type: IV induction ?Ventilation: Mask ventilation without difficulty and Oral airway inserted - appropriate to patient size ?Laryngoscope Size: Mac and 4 ?Grade View: Grade I ?Tube type: Oral ?Tube size: 7.5 mm ?Number of attempts: 1 ?Airway Equipment and Method: Stylet and Oral airway ?Placement Confirmation: ETT inserted through vocal cords under direct vision, positive ETCO2 and breath sounds checked- equal and bilateral ?Secured at: 23 cm ?Tube secured with: Tape ?Dental Injury: Teeth and Oropharynx as per pre-operative assessment  ? ? ? ? ?

## 2021-09-13 NOTE — Progress Notes (Signed)
Pharmacy Antibiotic Note ? ?David Whitehead is a 51 y.o. male admitted on 09/13/2021 with  Abdominal wall infection, VPS shunt removal .  Pharmacy has been consulted for IV vancomycin dosing. ? ?Scr 1.11 ?Cultures pending ?Resumed fluconazole/acyclovir from PTA meds ? ?Plan: ?Vancomycin '2250mg'$  LD x1 followed by '1250mg'$  q12hr (eAUC 512) ?Plan to obtain levels at steady state if therapy continued  ?Will monitor for acute changes in renal function and adjust as needed ?F/u cultures results and de-escalate as appropriate ? ?Height: '5\' 9"'$  (175.3 cm) ?Weight: 111.1 kg (245 lb) ?IBW/kg (Calculated) : 70.7 ? ?Temp (24hrs), Avg:98.3 ?F (36.8 ?C), Min:97.9 ?F (36.6 ?C), Max:98.5 ?F (36.9 ?C) ? ?Recent Labs  ?Lab 09/13/21 ?6213  ?WBC 5.3  ?CREATININE 1.11  ?  ?Estimated Creatinine Clearance: 100 mL/min (by C-G formula based on SCr of 1.11 mg/dL).   ? ?Allergies  ?Allergen Reactions  ? Bactrim [Sulfamethoxazole-Trimethoprim] Rash  ? Doxycycline Calcium Itching  ?  "felt like pins and needles were in face after one dose."  ? Latex Other (See Comments)  ?  Took skin off when removed tape  ? Allopurinol Itching and Rash  ? Amoxicillin Rash  ?  Has patient had a PCN reaction causing immediate rash, facial/tongue/throat swelling, SOB or lightheadedness with hypotension: No ?Has patient had a PCN reaction causing severe rash involving mucus membranes or skin necrosis: No ?Has patient had a PCN reaction that required hospitalization: No ?Has patient had a PCN reaction occurring within the last 10 years: No ?If all of the above answers are "NO", then may proceed with Cephalosporin use. ?  ? ? ?Thank you for allowing pharmacy to be a part of this patient?s care. ? ?Donnald Garre, PharmD ?Clinical Pharmacist ? ?Please check AMION for all [place] numbers ?After 10:00 PM, call [place] [phone] ? ? ?

## 2021-09-13 NOTE — Progress Notes (Signed)
?  Transition of Care (TOC) Screening Note ? ? ?Patient Details  ?Name: David Whitehead ?Date of Birth: 1970-08-21 ? ? ?Transition of Care (TOC) CM/SW Contact:    ?Benard Halsted, LCSW ?Phone Number: ?09/13/2021, 4:09 PM ? ? ? ?Transition of Care Department [facility]) has reviewed patient and no TOC needs have been identified at this time. We will continue to monitor patient advancement through interdisciplinary progression rounds. If new patient transition needs arise, please place a TOC consult. ? ? ?

## 2021-09-13 NOTE — Transfer of Care (Signed)
Immediate Anesthesia Transfer of Care Note ? ?[name] ? ?Procedure(s) Performed: SHUNT REMOVAL ? ?Patient Location: PACU ? ?Anesthesia Type:General ? ?Level of Consciousness: awake, patient cooperative and responds to stimulation ? ?Airway & Oxygen Therapy: Patient Spontanous Breathing ? ?Post-op Assessment: Report given to RN, Post -op Vital signs reviewed and stable and Patient moving all extremities X 4 ? ?Post vital signs: Reviewed and stable ? ?Last Vitals:  ?Vitals Value Taken Time  ?BP 133/94 09/13/21 0930  ?Temp    ?Pulse 80 09/13/21 0932  ?Resp 16 09/13/21 0932  ?SpO2 98 % 09/13/21 0932  ?Vitals shown include unvalidated device data. ? ?Last Pain:  ?Vitals:  ? 09/13/21 0602  ?TempSrc:   ?PainSc: 0-No pain  ?   ? ?  ? ?Complications: No notable events documented. ?

## 2021-09-13 NOTE — Anesthesia Postprocedure Evaluation (Signed)
Anesthesia Post Note ? ?[name] ? ?Procedure(s) Performed: SHUNT REMOVAL ? ?  ? ?Patient location during evaluation: PACU ?Anesthesia Type: General ?Level of consciousness: awake and alert ?Pain management: pain level controlled ?Vital Signs Assessment: post-procedure vital signs reviewed and stable ?Respiratory status: spontaneous breathing, nonlabored ventilation, respiratory function stable and patient connected to nasal cannula oxygen ?Cardiovascular status: blood pressure returned to baseline and stable ?Postop Assessment: no apparent nausea or vomiting ?Anesthetic complications: no ? ? ?No notable events documented. ? ?Last Vitals:  ?Vitals:  ? 09/13/21 2000 09/13/21 2100  ?BP: (!) 141/95 136/87  ?Pulse: (!) 107 (!) 101  ?Resp: (!) 21 (!) 23  ?Temp: 36.8 ?C   ?SpO2: 96% 94%  ?  ?Last Pain:  ?Vitals:  ? 09/13/21 2000  ?TempSrc: Oral  ?PainSc: 0-No pain  ? ? ?  ?  ?  ?  ?  ?  ? ?David Whitehead ? ? ? ? ?

## 2021-09-14 ENCOUNTER — Inpatient Hospital Stay (HOSPITAL_COMMUNITY): Payer: Medicare Other

## 2021-09-14 ENCOUNTER — Encounter (HOSPITAL_COMMUNITY): Payer: Self-pay | Admitting: Neurosurgery

## 2021-09-14 MED ORDER — CEPHALEXIN 500 MG PO CAPS: 500.0000 mg | ORAL_CAPSULE | Freq: Four times a day (QID) | ORAL | 0 refills | Status: AC

## 2021-09-14 NOTE — Progress Notes (Signed)
Patient D/C to home per orders. All discharge instructions have been covered and questions answered. IV was removed from the left hand patient tolerated well. Patients belongings and discharge paperwork was placed in a bag and sent with the patient. Wife is waiting out front and patient was taken out by wheelchair.  ?

## 2021-09-14 NOTE — Discharge Summary (Signed)
?Physician Discharge Summary  ?Patient ID: ?David Whitehead ?MRN: [id number] ?DOB/AGE: [birth year] 51 y.o. ? ?Admit date: 09/13/2021 ?Discharge date: 09/14/2021 ? ?Admission Diagnoses:  ?Ventriculoperitoneal shunt infection ? ?Discharge Diagnoses:  ?Same ?Principal Problem: ?  Infection of ventriculo-peritoneal shunt ([place]) ? ? ?Discharged Condition: Stable ? ?Hospital Course:  ?David Whitehead is a 51 y.o. 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 He has a spontaneous pustule open over his abdominal catheter proximal to his prior incision, therefore had the shunt system removed. He tolerated the surgery well, post op CT showed small ICH without mass effect. Repeat CT stable, he was monitored in the icu overnight. He remained at his neurologic baseline and was ambulating independently. He was tolerating a normal diet and having normal bowel and bladder function. Incisions c/d/I. Cultures prelim were Gram + without growth yet.  ? ?Treatments: Surgery - removal of right VPS ? ?Discharge Exam: ?Blood pressure (!) 142/96, pulse 79, temperature 98 ?F (36.7 ?C), temperature source Oral, resp. rate 18, height '5\' 9"'$  (1.753 m), weight 111.1 kg, SpO2 99 %. ?Awake, alert, orientedx3 ?PERRL ?Speech fluent, appropriate ?CN grossly intact ?5/5 BUE/BLE ?Wounds c/d/i ? ?Disposition: Discharge disposition: 01-Home or Self Care ? ? ? ? ? ? ? ?Allergies as of 09/14/2021   ? ?   Reactions  ? Bactrim [sulfamethoxazole-trimethoprim] Rash  ? Doxycycline Calcium Itching  ? "felt like pins and needles were in face after one dose."  ? Latex Other (See Comments)  ? Took skin off when removed tape  ? Allopurinol Itching, Rash  ? Amoxicillin Rash  ? Has patient had a PCN reaction causing immediate rash, facial/tongue/throat swelling, SOB or lightheadedness with hypotension: No ?Has patient had a PCN reaction causing severe rash involving mucus membranes or skin necrosis: No ?Has patient had a PCN reaction that required  hospitalization: No ?Has patient had a PCN reaction occurring within the last 10 years: No ?If all of the above answers are "NO", then may proceed with Cephalosporin use.  ? ?  ? ?  ?Medication List  ?  ? ?TAKE these medications   ? ?acyclovir 400 MG tablet ?Commonly known as: ZOVIRAX ?Take 1 tablet (400 mg total) by mouth 2 (two) times daily. ?  ?amLODipine 5 MG tablet ?Commonly known as: NORVASC ?Take 1 tablet (5 mg total) by mouth daily. ?  ?cephALEXin 500 MG capsule ?Commonly known as: KEFLEX ?Take 1 capsule (500 mg total) by mouth 4 (four) times daily for 10 days. ?  ?cetirizine 10 MG tablet ?Commonly known as: ZYRTEC ?Take 10 mg by mouth daily. ?  ?fluconazole 200 MG tablet ?Commonly known as: DIFLUCAN ?Take 1 tablet (200 mg total) by mouth daily. ?  ?fluticasone 50 MCG/ACT nasal spray ?Commonly known as: FLONASE ?Place 2 sprays into both nostrils daily. ?  ?prenatal multivitamin Tabs tablet ?Take 1 tablet by mouth daily at 12 noon. ?  ?rosuvastatin 5 MG tablet ?Commonly known as: Crestor ?Take 1 tablet (5 mg total) by mouth daily. ?What changed: when to take this ?  ?Venclexta 100 MG tablet ?Generic drug: venetoclax ?TAKE 1 TABLET (100 MG) BY MOUTH DAILY. ?  ? ?  ? ? Follow-up Information   ? ? Ashok Pall, MD Follow up in 1 week(s).   ?Specialty: Neurosurgery ?Why: call the office in 1 week for lab results of shunt/csf ?Contact information: ?[address] ?[address] ?[place] [address] ?[phone] ? ? ?  ?  ? ?  ?  ? ?  ? ? ?  Signed: ?Theodoro Doing Demaya Hardge ?09/14/2021, 11:30 AM ? ? ?

## 2021-09-15 LAB — ANAEROBIC CULTURE W GRAM STAIN: Gram Stain: NONE SEEN

## 2021-09-16 ENCOUNTER — Telehealth: Payer: Medicare Other | Admitting: Infectious Disease

## 2021-09-16 ENCOUNTER — Other Ambulatory Visit: Payer: Self-pay

## 2021-09-16 ENCOUNTER — Telehealth: Payer: Self-pay | Admitting: Infectious Disease

## 2021-09-16 MED ORDER — MINOCYCLINE HCL 100 MG PO CAPS: 100.0000 mg | ORAL_CAPSULE | Freq: Two times a day (BID) | ORAL | 0 refills | Status: AC

## 2021-09-16 MED ORDER — DOXYCYCLINE HYCLATE 100 MG PO TABS: 100.0000 mg | ORAL_TABLET | Freq: Two times a day (BID) | ORAL | 0 refills | Status: DC

## 2021-09-16 NOTE — Telephone Encounter (Addendum)
Minocycline sent in for the patient and he is scheduled to see you in 2 weeks.  ?

## 2021-09-16 NOTE — Telephone Encounter (Signed)
Dr. Tommy Medal Keflex was also sent in for the patient on 09/14/21. Would you like him to take amoxicillin along with the Keflex. Please advise ?

## 2021-09-16 NOTE — Telephone Encounter (Signed)
Yes correction minocycline 100 mg BID x 14 day sent for the patient and patient informed. ?Jeffery Gammell T Bodie Abernethy ? ?

## 2021-09-16 NOTE — Telephone Encounter (Signed)
It looks like doxycycline is on his allergy list as well! Please advise ?

## 2021-09-16 NOTE — Telephone Encounter (Signed)
Received a secure chat message yesterday night when I was not on-call with regards to David Whitehead.  Apparently his shunt was removed and cultures were taken and Propionibacterium isolated he should be started on amoxicillin 500 mg orally 3 times daily with at least 2 weeks and a follow-up appointment with me ? ?I was actually unaware that the shunt was removed until I was sent secure chat ?

## 2021-09-16 NOTE — Telephone Encounter (Signed)
Patient stated he has taken amoxicillin in the past and has an a reaction to it, but does not remember the reaction he had to it. He stated he prefers not to do amoxicillin. Please advise. ?Ashlee Bewley T Tempest Frankland ? ?

## 2021-09-18 ENCOUNTER — Other Ambulatory Visit (HOSPITAL_COMMUNITY): Payer: Self-pay

## 2021-09-18 LAB — AEROBIC/ANAEROBIC CULTURE W GRAM STAIN (SURGICAL/DEEP WOUND)
Culture: NO GROWTH
Gram Stain: NONE SEEN

## 2021-09-22 ENCOUNTER — Encounter: Payer: Self-pay | Admitting: Family Medicine

## 2021-10-02 ENCOUNTER — Encounter: Payer: Self-pay | Admitting: Infectious Disease

## 2021-10-02 ENCOUNTER — Other Ambulatory Visit: Payer: Self-pay

## 2021-10-02 ENCOUNTER — Ambulatory Visit: Payer: Medicare Other | Admitting: Infectious Disease

## 2021-10-02 VITALS — BP 140/90 | HR 94 | Temp 98.2°F | Wt 245.0 lb

## 2021-10-02 DIAGNOSIS — T85730A Infection and inflammatory reaction due to ventricular intracranial (communicating) shunt, initial encounter: Secondary | ICD-10-CM

## 2021-10-02 DIAGNOSIS — B451 Cerebral cryptococcosis: Secondary | ICD-10-CM

## 2021-10-02 DIAGNOSIS — C9110 Chronic lymphocytic leukemia of B-cell type not having achieved remission: Secondary | ICD-10-CM

## 2021-10-02 DIAGNOSIS — E785 Hyperlipidemia, unspecified: Secondary | ICD-10-CM

## 2021-10-02 DIAGNOSIS — D801 Nonfamilial hypogammaglobulinemia: Secondary | ICD-10-CM

## 2021-10-02 NOTE — Progress Notes (Signed)
Subjective:   Chief complaint for recent shunt infection   Patient ID: David Whitehead, male    DOB: Sep 29, 1970, 51 y.o.   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  He underwent VP shunt placement by Dr. Cyndy Freeze while in the hospital for control of his high intracranial pressures.  He completed nearly a month of amphotericin with flucytosine.  Towards the end of his month of therapy he did develop a drug rash and acute kidney injury both attributed to the amphotericin.  He was given prednisone for 3 days and still had the rash at discharge.   He had follow-up labs as an outpatient with his primary care physician which showed his creatinine was still around the same range as it was when he was discharged from the hospital.  He thought the rash was getting worse but actually since he was seen by the PCP it is actually been improving.   Per Dr. [name] the rash was largely on his flank and torso but extended to his extremities when it was at its worse.   The patient confirmed this and showed me that the rash had disappeared from his lower extremities and his back.  It also dissappeared from his torso.  T   I last saw him in clinic in early November 2020 and he was having result resolution of this rash.  He  remains currently on venotoclax with Dr. Marin Olp for his CLL.  He has no headaches or symptoms that would be concerning for recurrence of his cryptococcal meningitis.  He did have a recent outbreak of zoster several visits ago  He been feeling relatively well recently though he does suffer from fatigue which he attributes to his chemotherapy.  He had some concern for tick borne infection that we addressed at prior visit Also discussed the idea of coming off the fluconazole since he has had adequate treatment based on guidelines but he wanted to stay on it fand still desires this.  The venotoclax  is not as perilous for fungal infection but likely does put him at some risk and he wants to remain on the azole    He remains on fluconazole and continues on venotoclax--the latter requiring less of a dose due to drug drug interaction with fluconazole.  For his last visit he had noticed an area on his abdomen in the past several weeks that is erythematous but not esp tender but seems to track along where his VP shunt tracks.  Ultimately he was seen by Dr. Christella Noa and shunt was removed and cultures taken which yielded a Propionibacterium.  We treated with minocycline for 2 weeks and he is feeling well he has no headaches or symptoms of meningitis having been off antibiotics.      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   Sleep apnea    uses a cpap   Tick bite 09/17/2020    Past Surgical History:  Procedure Laterality Date   AXILLARY LYMPH NODE DISSECTION Right 04/16/2018   Procedure: AXILLARY LYMPH NODE DEEP EXCISION;  Surgeon: Fanny Skates, MD;  Location: [facility];  Service: General;  Laterality: Right;   COLONOSCOPY N/A 04/01/2019   Procedure: COLONOSCOPY;  Surgeon: Danie Binder, MD;  [facility];  Service: Endoscopy;  Laterality: N/A;  12:45pm   ESOPHAGOGASTRODUODENOSCOPY N/A 04/01/2019   Procedure: ESOPHAGOGASTRODUODENOSCOPY (EGD);  Surgeon: Danie Binder, MD;  Location: [facility];  Service: Endoscopy;  Laterality: N/A;   FINGER SURGERY     SHUNT REMOVAL N/A 09/13/2021   Procedure: SHUNT REMOVAL;  Surgeon: Ashok Pall, MD;  Location: [place];  Service: Neurosurgery;  Laterality: N/A;  RM 19   VENTRICULOPERITONEAL SHUNT Right 02/18/2019   Procedure: SHUNT INSERTION  VENTRICULAR-PERITONEAL;  Surgeon: Ashok Pall, MD;  Location: [place];  Service: Neurosurgery;  Laterality: Right;  SHUNT INSERTION VENTRICULAR-PERITONEAL    Family History  Problem Relation Age of Onset   Colon cancer Father 38       passed from [facility]   Colon cancer Paternal Uncle 28       passed from [place] History   Marital status: Married    Spouse name: [name]   Number of children: 0   Years of education: 13   Highest education level: Not on file  Occupational History   Not on file  Tobacco Use   Smoking status: Never   Smokeless tobacco: Never  Vaping Use   Vaping Use: Never used  Substance and Sexual Activity   Alcohol use: Not Currently   Drug use: Never   Sexual activity: Yes  Other Topics Concern   Not on file  Social History Narrative   Right handed    Lives with wife, [name]   Caffeine use: rare   Social Determinants of Health   Financial Resource Strain: Not on file  Food Insecurity: Not on file  Transportation Needs: Not on file  Physical Activity: Not on file  Stress: Not on file  Social Connections: Not on file    Allergies  Allergen Reactions   Bactrim [Sulfamethoxazole-Trimethoprim] Rash   Doxycycline Calcium Itching    "felt like pins and needles were in face after one dose."   Latex Other (See Comments)    Took skin off when removed tape   Allopurinol Itching and Rash   Amoxicillin Rash    Has patient had a PCN reaction causing immediate rash, facial/tongue/throat swelling, SOB or lightheadedness with hypotension: No Has patient had a PCN reaction causing severe rash involving mucus membranes or skin necrosis: No Has patient had a PCN reaction that required hospitalization: No Has patient had a PCN reaction occurring within the last 10 years: No If all of the above answers are "NO", then may proceed with Cephalosporin use.      Current Outpatient Medications:    acyclovir (ZOVIRAX) 400 MG tablet, Take 1  tablet (400 mg total) by mouth 2 (two) times daily., Disp: 60 tablet, Rfl: 6   amLODipine (NORVASC) 5 MG tablet, Take 1 tablet (5 mg total) by mouth daily., Disp: 90 tablet, Rfl: 1   cetirizine (ZYRTEC) 10 MG tablet, Take 10 mg by mouth daily., Disp: , Rfl:    fluconazole (DIFLUCAN) 200 MG tablet, Take 1 tablet (200 mg total) by mouth daily., Disp: 30 tablet, Rfl: 10   fluticasone (FLONASE) 50 MCG/ACT nasal spray, Place 2 sprays into both nostrils daily., Disp: 16 g, Rfl: 6   Prenatal Vit-Fe Fumarate-FA (PRENATAL MULTIVITAMIN) TABS tablet, Take 1 tablet by mouth daily at 12 noon., Disp: ,  Rfl:    rosuvastatin (CRESTOR) 5 MG tablet, Take 1 tablet (5 mg total) by mouth daily. (Patient taking differently: Take 5 mg by mouth at bedtime.), Disp: 90 tablet, Rfl: 1   venetoclax (VENCLEXTA) 100 MG tablet, TAKE 1 TABLET (100 MG) BY MOUTH DAILY., Disp: 30 tablet, Rfl: 6   Review of Systems  Constitutional:  Negative for activity change, appetite change, chills, diaphoresis, fatigue, fever and unexpected weight change.  HENT:  Negative for congestion, rhinorrhea, sinus pressure, sneezing, sore throat and trouble swallowing.   Eyes:  Negative for photophobia and visual disturbance.  Respiratory:  Negative for cough, chest tightness, shortness of breath, wheezing and stridor.   Cardiovascular:  Negative for chest pain, palpitations and leg swelling.  Gastrointestinal:  Negative for abdominal distention, abdominal pain, anal bleeding, blood in stool, constipation, diarrhea, nausea and vomiting.  Genitourinary:  Negative for difficulty urinating, dysuria, flank pain and hematuria.  Musculoskeletal:  Negative for arthralgias, back pain, gait problem, joint swelling and myalgias.  Skin:  Negative for color change, pallor, rash and wound.  Neurological:  Negative for dizziness, tremors, weakness, light-headedness and headaches.  Hematological:  Negative for adenopathy. Does not bruise/bleed easily.   Psychiatric/Behavioral:  Negative for agitation, behavioral problems, confusion, decreased concentration, dysphoric mood, sleep disturbance and suicidal ideas.       Objective:   Physical Exam Constitutional:      Appearance: He is well-developed.  HENT:     Head: Normocephalic and atraumatic.  Eyes:     Conjunctiva/sclera: Conjunctivae normal.  Cardiovascular:     Rate and Rhythm: Normal rate and regular rhythm.  Pulmonary:     Effort: Pulmonary effort is normal. No respiratory distress.     Breath sounds: No wheezing.  Abdominal:     General: There is no distension.     Palpations: Abdomen is soft.  Musculoskeletal:        General: No tenderness. Normal range of motion.     Cervical back: Normal range of motion and neck supple.  Skin:    General: Skin is warm and dry.     Coloration: Skin is not pale.     Findings: No erythema or rash.  Neurological:     General: No focal deficit present.     Mental Status: He is alert and oriented to person, place, and time.  Psychiatric:        Mood and Affect: Mood normal.        Behavior: Behavior normal.        Thought Content: Thought content normal.        Judgment: Judgment normal.    Abdomen where he has new erythematous area 08/27/2021:         Assessment & Plan:   Shunt malfunction with shunt eroding through the skin and concern for infection status post removal of shunt culture of shunt and CSF yielding Propionibacterium.  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

## 2021-10-09 ENCOUNTER — Other Ambulatory Visit (HOSPITAL_COMMUNITY): Payer: Self-pay

## 2021-10-11 ENCOUNTER — Other Ambulatory Visit (HOSPITAL_COMMUNITY): Payer: Self-pay

## 2021-10-14 ENCOUNTER — Other Ambulatory Visit (HOSPITAL_COMMUNITY): Payer: Self-pay

## 2021-10-22 ENCOUNTER — Other Ambulatory Visit (HOSPITAL_COMMUNITY): Payer: Self-pay

## 2021-11-05 ENCOUNTER — Other Ambulatory Visit (HOSPITAL_COMMUNITY): Payer: Self-pay

## 2021-11-11 ENCOUNTER — Other Ambulatory Visit (HOSPITAL_COMMUNITY): Payer: Self-pay

## 2021-11-13 ENCOUNTER — Other Ambulatory Visit (HOSPITAL_COMMUNITY): Payer: Self-pay

## 2021-11-20 ENCOUNTER — Other Ambulatory Visit (HOSPITAL_COMMUNITY): Payer: Self-pay

## 2021-11-21 ENCOUNTER — Encounter: Payer: Self-pay | Admitting: Family

## 2021-11-21 ENCOUNTER — Inpatient Hospital Stay: Payer: Medicare Other | Admitting: Family

## 2021-11-21 ENCOUNTER — Encounter: Payer: Self-pay | Admitting: *Deleted

## 2021-11-21 ENCOUNTER — Inpatient Hospital Stay: Payer: Medicare Other | Attending: Hematology & Oncology

## 2021-11-21 VITALS — BP 125/94 | HR 90 | Temp 98.4°F | Resp 18 | Ht 69.0 in | Wt 242.0 lb

## 2021-11-21 DIAGNOSIS — B451 Cerebral cryptococcosis: Secondary | ICD-10-CM | POA: Diagnosis not present

## 2021-11-21 DIAGNOSIS — D696 Thrombocytopenia, unspecified: Secondary | ICD-10-CM

## 2021-11-21 DIAGNOSIS — C9110 Chronic lymphocytic leukemia of B-cell type not having achieved remission: Secondary | ICD-10-CM

## 2021-11-21 LAB — CBC WITH DIFFERENTIAL (CANCER CENTER ONLY)
Abs Immature Granulocytes: 0.05 10*3/uL (ref 0.00–0.07)
Basophils Absolute: 0 10*3/uL (ref 0.0–0.1)
Basophils Relative: 1 %
Eosinophils Absolute: 0.1 10*3/uL (ref 0.0–0.5)
Eosinophils Relative: 1 %
HCT: 46 % (ref 39.0–52.0)
Hemoglobin: 15.4 g/dL (ref 13.0–17.0)
Immature Granulocytes: 1 %
Lymphocytes Relative: 26 %
Lymphs Abs: 1.5 10*3/uL (ref 0.7–4.0)
MCH: 27.5 pg (ref 26.0–34.0)
MCHC: 33.5 g/dL (ref 30.0–36.0)
MCV: 82.1 fL (ref 80.0–100.0)
Monocytes Absolute: 0.4 10*3/uL (ref 0.1–1.0)
Monocytes Relative: 7 %
Neutro Abs: 3.8 10*3/uL (ref 1.7–7.7)
Neutrophils Relative %: 64 %
Platelet Count: 152 10*3/uL (ref 150–400)
RBC: 5.6 MIL/uL (ref 4.22–5.81)
RDW: 12.1 % (ref 11.5–15.5)
WBC Count: 5.9 10*3/uL (ref 4.0–10.5)
nRBC: 0 % (ref 0.0–0.2)

## 2021-11-21 LAB — CMP (CANCER CENTER ONLY)
ALT: 66 U/L — ABNORMAL HIGH (ref 0–44)
AST: 31 U/L (ref 15–41)
Albumin: 4.9 g/dL (ref 3.5–5.0)
Alkaline Phosphatase: 57 U/L (ref 38–126)
Anion gap: 8 (ref 5–15)
BUN: 20 mg/dL (ref 6–20)
CO2: 26 mmol/L (ref 22–32)
Calcium: 9.5 mg/dL (ref 8.9–10.3)
Chloride: 107 mmol/L (ref 98–111)
Creatinine: 1.06 mg/dL (ref 0.61–1.24)
GFR, Estimated: >60 mL/min (ref >60)
Glucose, Bld: 154 mg/dL — ABNORMAL HIGH (ref 70–99)
Potassium: 3.9 mmol/L (ref 3.5–5.1)
Sodium: 141 mmol/L (ref 135–145)
Total Bilirubin: 0.5 mg/dL (ref 0.3–1.2)
Total Protein: 6.3 g/dL — ABNORMAL LOW (ref 6.5–8.1)

## 2021-11-21 LAB — LACTATE DEHYDROGENASE: LDH: 191 U/L (ref 98–192)

## 2021-11-21 NOTE — Progress Notes (Signed)
Hematology and Oncology Follow Up Visit  David Whitehead [id number] Jul 20, 1970 51 y.o. 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 He is doing well and notes occasional fatigue.  WBC count is stable at 5.9, lymphocytes 26%, neutrophils 64% and monocytes 7%.  No fever, chills, n/v, cough, rash, dizziness, SOB, chest pain, palpitations, abdominal pain or changes in bowel or bladder habits.  No swelling, tenderness, numbness or tingling in his extremities.  No falls or syncope.  Appetite and hydration are good. Appetite is stable at 247 lbs.   ECOG Performance Status: 1 - Symptomatic but completely ambulatory  Medications:  Allergies as of 11/21/2021       Reactions   Bactrim [sulfamethoxazole-trimethoprim] Rash   Doxycycline Calcium Itching   "felt like pins and needles were in face after one dose."   Latex Other (See Comments)   Took skin off when removed tape   Allopurinol Itching, Rash   Amoxicillin Rash   Has patient had a PCN reaction causing immediate rash, facial/tongue/throat swelling, SOB or lightheadedness with hypotension: No Has patient had a PCN reaction causing severe rash involving mucus membranes or skin necrosis: No Has patient had a PCN reaction that required hospitalization: No Has patient had a PCN reaction occurring within the last 10 years: No If all of the above answers are "NO", then may proceed with Cephalosporin use.        Medication List        Accurate as of November 21, 2021 12:01 PM. If you have any questions, ask your nurse or doctor.          acyclovir 400 MG tablet Commonly known as: ZOVIRAX Take 1 tablet (400 mg total) by mouth 2 (two) times daily.   amLODipine 5 MG tablet Commonly known as:  NORVASC Take 1 tablet (5 mg total) by mouth daily.   cetirizine 10 MG tablet Commonly known as: ZYRTEC Take 10 mg by mouth daily.   fluconazole 200 MG tablet Commonly known as: DIFLUCAN Take 1 tablet (200 mg total) by mouth daily.   fluticasone 50 MCG/ACT nasal spray Commonly known as: FLONASE Place 2 sprays into both nostrils daily.   prenatal multivitamin Tabs tablet Take 1 tablet by mouth daily at 12 noon.   rosuvastatin 5 MG tablet Commonly known as: Crestor Take 1 tablet (5 mg total) by mouth daily. What changed: when to take this   Venclexta 100 MG tablet Generic drug: venetoclax TAKE 1 TABLET (100 MG) BY MOUTH DAILY.        Allergies:  Allergies  Allergen Reactions   Bactrim [Sulfamethoxazole-Trimethoprim] Rash   Doxycycline Calcium Itching    "felt like pins and needles were in face after one dose."   Latex Other (See Comments)    Took skin off when removed tape   Allopurinol Itching and Rash   Amoxicillin Rash    Has patient had a PCN reaction causing immediate rash, facial/tongue/throat swelling, SOB or lightheadedness with hypotension: No Has patient had a PCN reaction causing severe rash involving mucus membranes or skin necrosis: No Has patient had a PCN reaction that required hospitalization: No Has patient had a PCN reaction occurring within the last 10 years: No If all of  the above answers are "NO", then may proceed with Cephalosporin use.     Past Medical History, Surgical history, Social history, and Family History were reviewed and updated.  Review of Systems: All other 10 point review of systems is negative.   Physical Exam:  height is '5\' 9"'$  (1.753 m) and weight is 242 lb (109.8 kg). His oral temperature is 98.4 F (36.9 C). His blood pressure is 125/94 (abnormal) and his pulse is 90. His respiration is 18 and oxygen saturation is 99%.   Wt Readings from Last 3 Encounters:  11/21/21 242 lb (109.8 kg)  10/02/21 245 lb (111.1 kg)   09/13/21 245 lb (111.1 kg)    Ocular: Sclerae unicteric, pupils equal, round and reactive to light Ear-nose-throat: Oropharynx clear, dentition fair Lymphatic: No cervical or supraclavicular adenopathy Lungs no rales or rhonchi, good excursion bilaterally Heart regular rate and rhythm, no murmur appreciated Abd soft, nontender, positive bowel sounds MSK no focal spinal tenderness, no joint edema Neuro: non-focal, well-oriented, appropriate affect Breasts: Deferred   Lab Results  Component Value Date   WBC 5.9 11/21/2021   HGB 15.4 11/21/2021   HCT 46.0 11/21/2021   MCV 82.1 11/21/2021   PLT 152 11/21/2021   Lab Results  Component Value Date   FERRITIN 42 11/02/2019   IRON 48 11/02/2019   TIBC 329 11/02/2019   UIBC 280 11/02/2019   IRONPCTSAT 15 (L) 11/02/2019   Lab Results  Component Value Date   RBC 5.60 11/21/2021   Lab Results  Component Value Date   KPAFRELGTCHN 82.6 (H) 01/02/2020   LAMBDASER 4.3 (L) 01/02/2020   KAPLAMBRATIO 19.21 (H) 01/02/2020   Lab Results  Component Value Date   IGGSERUM 753 07/25/2021   IGA 9 (L) 07/25/2021   IGMSERUM <5 (L) 07/25/2021   Lab Results  Component Value Date   TOTALPROTELP 5.9 (L) 11/21/2020   ALBUMINELP 3.9 11/21/2020   A1GS 0.1 11/21/2020   A2GS 0.5 11/21/2020   BETS 0.9 11/21/2020   GAMS 0.5 11/21/2020   MSPIKE 0.2 (H) 11/21/2020     Chemistry      Component Value Date/Time   NA 141 11/21/2021 1053   NA 139 06/05/2021 0950   K 3.9 11/21/2021 1053   CL 107 11/21/2021 1053   CO2 26 11/21/2021 1053   BUN 20 11/21/2021 1053   BUN 18 06/05/2021 0950   CREATININE 1.06 11/21/2021 1053   CREATININE 1.07 02/26/2021 1130      Component Value Date/Time   CALCIUM 9.5 11/21/2021 1053   ALKPHOS 57 11/21/2021 1053   AST 31 11/21/2021 1053   ALT 66 (H) 11/21/2021 1053   BILITOT 0.5 11/21/2021 1053       Impression and Plan: David Whitehead is a very pleasant 51 yo gentleman with an aggressive CLL by virtue of  the cytogenetics.  No changes to Venetoclax regimen at this time.  Patient interesting in taking a break from Venetoclax at some point.  Follow-up in 4 months.   Lottie Dawson, NP 7/13/202312:01 PM

## 2021-11-21 NOTE — Progress Notes (Signed)
Patient has been stable for some time without navigation needs. Will discontinue active navigation at this time.   Oncology Nurse Navigator Documentation     11/21/2021    1:00 PM  Oncology Nurse Navigator Flowsheets  Navigation Complete Date: 11/21/2021  Post Navigation: Continue to Follow Patient? No  Reason Not Navigating Patient: Patient On Maintenance Chemotherapy  Navigator Location [facility]  Navigator Encounter Type Appt/Treatment Plan Review  Patient Visit Type MedOnc  Treatment Phase Active Tx  Barriers/Navigation Needs No Barriers At This Time  Interventions None Required  Acuity Level 1-No Barriers  Support Groups/Services Friends and Family  Time Spent with Patient 15

## 2021-11-22 LAB — IGG, IGA, IGM
IgA: 9 mg/dL — ABNORMAL LOW (ref 90–386)
IgG (Immunoglobin G), Serum: 617 mg/dL (ref 603–1613)
IgM (Immunoglobulin M), Srm: <5 mg/dL — ABNORMAL LOW (ref 20–172)

## 2021-11-25 ENCOUNTER — Other Ambulatory Visit: Payer: Medicare Other

## 2021-11-25 ENCOUNTER — Ambulatory Visit: Payer: Medicare Other | Admitting: Family

## 2021-11-26 ENCOUNTER — Other Ambulatory Visit (HOSPITAL_COMMUNITY): Payer: Self-pay

## 2021-11-29 ENCOUNTER — Other Ambulatory Visit (HOSPITAL_COMMUNITY): Payer: Self-pay

## 2021-12-13 ENCOUNTER — Other Ambulatory Visit (HOSPITAL_COMMUNITY): Payer: Self-pay

## 2021-12-16 ENCOUNTER — Other Ambulatory Visit (HOSPITAL_COMMUNITY): Payer: Self-pay

## 2021-12-18 ENCOUNTER — Other Ambulatory Visit (HOSPITAL_COMMUNITY): Payer: Self-pay

## 2021-12-30 ENCOUNTER — Other Ambulatory Visit (HOSPITAL_COMMUNITY): Payer: Self-pay

## 2022-01-22 ENCOUNTER — Other Ambulatory Visit (HOSPITAL_COMMUNITY): Payer: Self-pay

## 2022-01-22 ENCOUNTER — Other Ambulatory Visit: Payer: Self-pay | Admitting: Hematology & Oncology

## 2022-01-22 MED ORDER — VENETOCLAX 100 MG PO TABS
ORAL_TABLET | ORAL | 6 refills | Status: DC
  Filled 2022-01-22: qty 30, 30d supply, fill #0
  Filled 2022-02-24: qty 30, 30d supply, fill #1
  Filled 2022-03-25: qty 30, 30d supply, fill #2
  Filled 2022-04-22 – 2022-04-25 (×2): qty 30, 30d supply, fill #3
  Filled 2022-05-20: qty 30, 30d supply, fill #4
  Filled 2022-06-18: qty 30, 30d supply, fill #5
  Filled 2022-07-15: qty 30, 30d supply, fill #6

## 2022-01-27 ENCOUNTER — Other Ambulatory Visit (HOSPITAL_COMMUNITY): Payer: Self-pay

## 2022-02-06 ENCOUNTER — Other Ambulatory Visit: Payer: Self-pay | Admitting: Infectious Disease

## 2022-02-10 ENCOUNTER — Ambulatory Visit: Payer: Medicare Other | Admitting: Family Medicine

## 2022-02-10 VITALS — BP 120/86 | HR 84 | Temp 98.2°F | Ht 69.0 in | Wt 249.0 lb

## 2022-02-10 DIAGNOSIS — I10 Essential (primary) hypertension: Secondary | ICD-10-CM

## 2022-02-10 DIAGNOSIS — E782 Mixed hyperlipidemia: Secondary | ICD-10-CM | POA: Diagnosis not present

## 2022-02-10 DIAGNOSIS — Z23 Encounter for immunization: Secondary | ICD-10-CM

## 2022-02-10 MED ORDER — ROSUVASTATIN CALCIUM 5 MG PO TABS: 5.0000 mg | ORAL_TABLET | Freq: Every day | ORAL | 1 refills | Status: DC

## 2022-02-10 MED ORDER — AMLODIPINE BESYLATE 5 MG PO TABS: 5.0000 mg | ORAL_TABLET | Freq: Every day | ORAL | 1 refills | Status: DC

## 2022-02-10 NOTE — Progress Notes (Signed)
   Subjective:    Patient ID: David Whitehead, male    DOB: Sep 22, 1970, 51 y.o.   MRN: [id number]  6 month follow up   Hypertension This is a chronic problem. Treatments tried: amlodipine.  Patient for blood pressure check up.  The patient does have hypertension.   Patient relates dietary measures try to minimize salt The importance of healthy diet and activity were discussed Patient relates compliance  Patient here for follow-up regarding cholesterol.    Patient relates taking medication on a regular basis Denies problems with medication Importance of dietary measures discussed Regular lab work regarding lipid and liver was checked and if needing additional labs was appropriately ordered     Review of Systems     Objective:   Physical Exam General-in no acute distress Eyes-no discharge Lungs-respiratory rate normal, CTA CV-no murmurs,RRR Extremities skin warm dry no edema Neuro grossly normal Behavior normal, alert        Assessment & Plan:  HTN- patient seen for follow-up regarding HTN.   Diet, medication compliance, appropriate labs and refills were completed.   Importance of keeping blood pressure under good control to lessen the risk of complications discussed Regular follow-up visits discussed  Hyperlipidemia-importance of diet, weight control, activity, compliance with medications discussed.   Recent labs reviewed.   Any additional labs or refills ordered.   Importance of keeping under good control discussed. Regular follow-up visits discussed  Labs ordered.  Healthy eating regular physical activity recommended

## 2022-02-18 ENCOUNTER — Other Ambulatory Visit (HOSPITAL_COMMUNITY): Payer: Self-pay

## 2022-02-20 ENCOUNTER — Other Ambulatory Visit (HOSPITAL_COMMUNITY): Payer: Self-pay

## 2022-02-24 ENCOUNTER — Other Ambulatory Visit (HOSPITAL_COMMUNITY): Payer: Self-pay

## 2022-02-26 ENCOUNTER — Ambulatory Visit: Payer: Medicare Other | Admitting: Infectious Disease

## 2022-02-27 ENCOUNTER — Telehealth: Payer: Self-pay | Admitting: Family Medicine

## 2022-02-27 NOTE — Telephone Encounter (Signed)
  Left message for patient to call back and schedule Medicare Annual Wellness Visit (AWV) in office.   If unable to come into the office for AWV,  please offer to do virtually or by telephone.  No hx of AWV eligible for AWVI per [employer] as of  02/09/2022  Please schedule at anytime with [facility]-Nurse Health Advisor.      45 minute appointment   Any questions, please call me at [phone]

## 2022-03-03 ENCOUNTER — Ambulatory Visit: Payer: Medicare Other | Admitting: Infectious Disease

## 2022-03-03 ENCOUNTER — Other Ambulatory Visit (HOSPITAL_COMMUNITY): Payer: Self-pay

## 2022-03-03 ENCOUNTER — Other Ambulatory Visit: Payer: Self-pay

## 2022-03-03 ENCOUNTER — Encounter: Payer: Self-pay | Admitting: Infectious Disease

## 2022-03-03 VITALS — BP 158/105 | HR 80 | Temp 97.5°F | Wt 250.0 lb

## 2022-03-03 DIAGNOSIS — B451 Cerebral cryptococcosis: Secondary | ICD-10-CM | POA: Diagnosis not present

## 2022-03-03 DIAGNOSIS — Z7185 Encounter for immunization safety counseling: Secondary | ICD-10-CM

## 2022-03-03 DIAGNOSIS — C9110 Chronic lymphocytic leukemia of B-cell type not having achieved remission: Secondary | ICD-10-CM

## 2022-03-03 DIAGNOSIS — Z982 Presence of cerebrospinal fluid drainage device: Secondary | ICD-10-CM

## 2022-03-03 MED ORDER — FLUCONAZOLE 200 MG PO TABS: 200.0000 mg | ORAL_TABLET | Freq: Every day | ORAL | 11 refills | Status: DC

## 2022-03-03 NOTE — Progress Notes (Signed)
Subjective:   Complaint follow-up for cryptococcal meningitis, prior shunt infection   Patient ID: David Whitehead, male    DOB: 03-17-1971, 51 y.o.   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  He underwent VP shunt placement by Dr. Cyndy Freeze while in the hospital for control of his high intracranial pressures.  He completed nearly a month of amphotericin with flucytosine.  Towards the end of his month of therapy he did develop a drug rash and acute kidney injury both attributed to the amphotericin.  He was given prednisone for 3 days and still had the rash at discharge.   He had follow-up labs as an outpatient with his primary care physician which showed his creatinine was still around the same range as it was when he was discharged from the hospital.  He thought the rash was getting worse but actually since he was seen by the PCP it is actually been improving.   Per Dr. [name] the rash was largely on his flank and torso but extended to his extremities when it was at its worse.   The patient confirmed this and showed me that the rash had disappeared from his lower extremities and his back.  It also dissappeared from his torso.  T   I last saw him in clinic in early November 2020 and he was having resolution of this rash.  He  remains currently on venotoclax with Dr. Marin Olp for his CLL.  He has no headaches or symptoms that would be concerning for recurrence of his cryptococcal meningitis.   He been feeling relatively well recently though he does suffer from fatigue which he attributes to his chemotherapy.    He remains on fluconazole and continues on venotoclax--the latter requiring less of a dose due to drug drug interaction with fluconazole.  For his last visit he had noticed an area on his abdomen in the past several weeks that is erythematous but not esp tender but seems to  track along where his VP shunt tracks.  Ultimately he was seen by Dr. Christella Noa and shunt was removed and cultures taken which yielded a Propionibacterium.  We treated with minocycline for 2 weeks and he is feeling well he has no headaches or symptoms of meningitis having been off antibiotics.      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   Sleep apnea    uses a cpap   Tick bite 09/17/2020    Past Surgical History:  Procedure Laterality Date   AXILLARY LYMPH NODE DISSECTION Right 04/16/2018   Procedure: AXILLARY LYMPH NODE DEEP EXCISION;  Surgeon: Fanny Skates, MD;  Location: [facility];  Service: General;  Laterality: Right;   COLONOSCOPY N/A 04/01/2019   Procedure: COLONOSCOPY;  Surgeon: Danie Binder, MD;  [facility];  Service: Endoscopy;  Laterality: N/A;  12:45pm   ESOPHAGOGASTRODUODENOSCOPY N/A 04/01/2019   Procedure: ESOPHAGOGASTRODUODENOSCOPY (EGD);  Surgeon: Danie Binder, MD;  [facility];  Service: Endoscopy;  Laterality: N/A;   FINGER SURGERY     SHUNT REMOVAL N/A 09/13/2021   Procedure: SHUNT  REMOVAL;  Surgeon: Ashok Pall, MD;  Location: [facility];  Service: Neurosurgery;  Laterality: N/A;  RM 19   VENTRICULOPERITONEAL SHUNT Right 02/18/2019   Procedure: SHUNT INSERTION VENTRICULAR-PERITONEAL;  Surgeon: Ashok Pall, MD;  Location: [facility];  Service: Neurosurgery;  Laterality: Right;  SHUNT INSERTION VENTRICULAR-PERITONEAL    Family History  Problem Relation Age of Onset   Colon cancer Father 2       passed from [facility]   Colon cancer Paternal Uncle 36       passed from [place] History   Marital status: Married    Spouse name: [name]   Number of  children: 0   Years of education: 59   Highest education level: Not on file  Occupational History   Not on file  Tobacco Use   Smoking status: Never   Smokeless tobacco: Never  Vaping Use   Vaping Use: Never used  Substance and Sexual Activity   Alcohol use: Not Currently   Drug use: Never   Sexual activity: Yes  Other Topics Concern   Not on file  Social History Narrative   Right handed    Lives with wife, [name]   Caffeine use: rare   Social Determinants of Health   Financial Resource Strain: Low Risk  (03/07/2019)   Overall Financial Resource Strain (CARDIA)    Difficulty of Paying Living Expenses: Not very hard  Food Insecurity: No Food Insecurity (03/07/2019)   Hunger Vital Sign    Worried About Running Out of Food in the Last Year: Never true    Ran Out of Food in the Last Year: Never true  Transportation Needs: No Transportation Needs (03/07/2019)   PRAPARE - Hydrologist (Medical): No    Lack of Transportation (Non-Medical): No  Physical Activity: Not on file  Stress: Not on file  Social Connections: Not on file    Allergies  Allergen Reactions   Bactrim [Sulfamethoxazole-Trimethoprim] Rash   Doxycycline Calcium Itching    "felt like pins and needles were in face after one dose."   Latex Other (See Comments)    Took skin off when removed tape   Allopurinol Itching and Rash   Amoxicillin Rash    Has patient had a PCN reaction causing immediate rash, facial/tongue/throat swelling, SOB or lightheadedness with hypotension: No Has patient had a PCN reaction causing severe rash involving mucus membranes or skin necrosis: No Has patient had a PCN reaction that required hospitalization: No Has patient had a PCN reaction occurring within the last 10 years: No If all of the above answers are "NO", then may proceed with Cephalosporin use.      Current Outpatient Medications:    acyclovir (ZOVIRAX) 400 MG tablet, Take 1 tablet (400 mg  total) by mouth 2 (two) times daily., Disp: 60 tablet, Rfl: 6   amLODipine (NORVASC) 5 MG tablet, Take 1 tablet (5 mg total) by mouth daily., Disp: 90 tablet, Rfl: 1   cetirizine (ZYRTEC) 10 MG tablet, Take 10 mg by mouth daily., Disp: , Rfl:    fluconazole (DIFLUCAN) 200 MG tablet, Take 1 tablet by mouth once daily, Disp: 30 tablet, Rfl: 0   fluticasone (FLONASE) 50 MCG/ACT nasal spray, Place 2 sprays into both nostrils daily., Disp: 16 g, Rfl: 6   Prenatal Vit-Fe Fumarate-FA (PRENATAL MULTIVITAMIN) TABS tablet, Take 1 tablet by mouth daily at 12 noon., Disp: , Rfl:    rosuvastatin (CRESTOR) 5  MG tablet, Take 1 tablet (5 mg total) by mouth at bedtime., Disp: 90 tablet, Rfl: 1   venetoclax (VENCLEXTA) 100 MG tablet, TAKE 1 TABLET (100 MG) BY MOUTH DAILY., Disp: 30 tablet, Rfl: 6   Review of Systems  Constitutional:  Negative for activity change, appetite change, chills, diaphoresis, fatigue, fever and unexpected weight change.  HENT:  Negative for congestion, rhinorrhea, sinus pressure, sneezing, sore throat and trouble swallowing.   Eyes:  Negative for photophobia and visual disturbance.  Respiratory:  Negative for cough, chest tightness, shortness of breath, wheezing and stridor.   Cardiovascular:  Negative for chest pain, palpitations and leg swelling.  Gastrointestinal:  Negative for abdominal distention, abdominal pain, anal bleeding, blood in stool, constipation, diarrhea, nausea and vomiting.  Genitourinary:  Negative for difficulty urinating, dysuria, flank pain and hematuria.  Musculoskeletal:  Negative for arthralgias, back pain, gait problem, joint swelling and myalgias.  Skin:  Negative for color change, pallor, rash and wound.  Neurological:  Negative for dizziness, tremors, weakness and light-headedness.  Hematological:  Negative for adenopathy. Does not bruise/bleed easily.  Psychiatric/Behavioral:  Negative for agitation, behavioral problems, confusion, decreased concentration,  dysphoric mood and sleep disturbance.        Objective:   Physical Exam Constitutional:      General: He is not in acute distress.    Appearance: Normal appearance. He is well-developed. He is not ill-appearing or diaphoretic.  HENT:     Head: Normocephalic and atraumatic.     Right Ear: Hearing and external ear normal.     Left Ear: Hearing and external ear normal.     Nose: No nasal deformity or rhinorrhea.  Eyes:     General: No scleral icterus.    Conjunctiva/sclera: Conjunctivae normal.     Right eye: Right conjunctiva is not injected.     Left eye: Left conjunctiva is not injected.     Pupils: Pupils are equal, round, and reactive to light.  Neck:     Vascular: No JVD.  Cardiovascular:     Rate and Rhythm: Normal rate and regular rhythm.     Heart sounds: Normal heart sounds, S1 normal and S2 normal. No murmur heard.    No friction rub.  Abdominal:     General: Bowel sounds are normal. There is no distension.     Palpations: Abdomen is soft.     Tenderness: There is no abdominal tenderness.  Musculoskeletal:        General: Normal range of motion.     Right shoulder: Normal.     Left shoulder: Normal.     Cervical back: Normal range of motion and neck supple.     Right hip: Normal.     Left hip: Normal.     Right knee: Normal.     Left knee: Normal.  Lymphadenopathy:     Head:     Right side of head: No submandibular, preauricular or posterior auricular adenopathy.     Left side of head: No submandibular, preauricular or posterior auricular adenopathy.     Cervical: No cervical adenopathy.     Right cervical: No superficial or deep cervical adenopathy.    Left cervical: No superficial or deep cervical adenopathy.  Skin:    General: Skin is warm and dry.     Coloration: Skin is not pale.     Findings: No abrasion, bruising, ecchymosis, erythema, lesion or rash.     Nails: There is no clubbing.  Neurological:  General: No focal deficit present.     Mental  Status: He is alert and oriented to person, place, and time.     Sensory: No sensory deficit.     Coordination: Coordination normal.     Gait: Gait normal.  Psychiatric:        Attention and Perception: He is attentive.        Mood and Affect: Mood normal.        Speech: Speech normal.        Behavior: Behavior normal. Behavior is cooperative.        Thought Content: Thought content normal.        Judgment: Judgment normal.         Assessment & Plan:  C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  I did remind him we are practicing outside of the guidelines but have no big issue continue fluconazole as long as it is not doing harm and there is a potential that it may be preventing recurrence of his infection.  CLL remains on venetoclax: Is going to be following with Dr. Marin Olp.   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

## 2022-03-10 ENCOUNTER — Ambulatory Visit (INDEPENDENT_AMBULATORY_CARE_PROVIDER_SITE_OTHER): Payer: Medicare Other

## 2022-03-10 VITALS — Ht 69.0 in | Wt 250.0 lb

## 2022-03-10 DIAGNOSIS — Z0000 Encounter for general adult medical examination without abnormal findings: Secondary | ICD-10-CM | POA: Diagnosis not present

## 2022-03-10 DIAGNOSIS — T85730A Infection and inflammatory reaction due to ventricular intracranial (communicating) shunt, initial encounter: Secondary | ICD-10-CM | POA: Insufficient documentation

## 2022-03-10 NOTE — Patient Instructions (Signed)
David Whitehead , Thank you for taking time to come for your Medicare Wellness Visit. I appreciate your ongoing commitment to your health goals. Please review the following plan we discussed and let me know if I can assist you in the future.   Screening recommendations/referrals: Colonoscopy: 04/01/19 Recommended yearly ophthalmology/optometry visit for glaucoma screening and checkup Recommended yearly dental visit for hygiene and checkup  Vaccinations: Influenza vaccine: 02/10/22 Pneumococcal vaccine: n/d Tdap vaccine: n/d Shingles vaccine: n/d   Covid-19: 07/06/19, 08/03/19  Advanced directives: no  Conditions/risks identified: none  Next appointment: Follow up in one year for your annual wellness visit 03/24/23 @ 1 :40 pm by phone  Preventive Care 40-64 Years, Male Preventive care refers to lifestyle choices and visits with your health care provider that can promote health and wellness. What does preventive care include? A yearly physical exam. This is also called an annual well check. Dental exams once or twice a year. Routine eye exams. Ask your health care provider how often you should have your eyes checked. Personal lifestyle choices, including: Daily care of your teeth and gums. Regular physical activity. Eating a healthy diet. Avoiding tobacco and drug use. Limiting alcohol use. Practicing safe sex. Taking low-dose aspirin every day starting at age 39. What happens during an annual well check? The services and screenings done by your health care provider during your annual well check will depend on your age, overall health, lifestyle risk factors, and family history of disease. Counseling  Your health care provider may ask you questions about your: Alcohol use. Tobacco use. Drug use. Emotional well-being. Home and relationship well-being. Sexual activity. Eating habits. Work and work Statistician. Screening  You may have the following tests or  measurements: Height, weight, and BMI. Blood pressure. Lipid and cholesterol levels. These may be checked every 5 years, or more frequently if you are over 47 years old. Skin check. Lung cancer screening. You may have this screening every year starting at age 37 if you have a 30-pack-year history of smoking and currently smoke or have quit within the past 15 years. Fecal occult blood test (FOBT) of the stool. You may have this test every year starting at age 59. Flexible sigmoidoscopy or colonoscopy. You may have a sigmoidoscopy every 5 years or a colonoscopy every 10 years starting at age 3. Prostate cancer screening. Recommendations will vary depending on your family history and other risks. Hepatitis C blood test. Hepatitis B blood test. Sexually transmitted disease (STD) testing. Diabetes screening. This is done by checking your blood sugar (glucose) after you have not eaten for a while (fasting). You may have this done every 1-3 years. Discuss your test results, treatment options, and if necessary, the need for more tests with your health care provider. Vaccines  Your health care provider may recommend certain vaccines, such as: Influenza vaccine. This is recommended every year. Tetanus, diphtheria, and acellular pertussis (Tdap, Td) vaccine. You may need a Td booster every 10 years. Zoster vaccine. You may need this after age 75. Pneumococcal 13-valent conjugate (PCV13) vaccine. You may need this if you have certain conditions and have not been vaccinated. Pneumococcal polysaccharide (PPSV23) vaccine. You may need one or two doses if you smoke cigarettes or if you have certain conditions. Talk to your health care provider about which screenings and vaccines you need and how often you need them. This information is not intended to replace advice given to you by your health care provider. Make sure you discuss any questions you  have with your health care provider. Document Released:  05/25/2015 Document Revised: 01/16/2016 Document Reviewed: 02/27/2015 Elsevier Interactive Patient Education  2017 [place] Prevention in the Home Falls can cause injuries. They can happen to people of all ages. There are many things you can do to make your home safe and to help prevent falls. What can I do on the outside of my home? Regularly fix the edges of walkways and driveways and fix any cracks. Remove anything that might make you trip as you walk through a door, such as a raised step or threshold. Trim any bushes or trees on the path to your home. Use bright outdoor lighting. Clear any walking paths of anything that might make someone trip, such as rocks or tools. Regularly check to see if handrails are loose or broken. Make sure that both sides of any steps have handrails. Any raised decks and porches should have guardrails on the edges. Have any leaves, snow, or ice cleared regularly. Use sand or salt on walking paths during winter. Clean up any spills in your garage right away. This includes oil or grease spills. What can I do in the bathroom? Use night lights. Install grab bars by the toilet and in the tub and shower. Do not use towel bars as grab bars. Use non-skid mats or decals in the tub or shower. If you need to sit down in the shower, use a plastic, non-slip stool. Keep the floor dry. Clean up any water that spills on the floor as soon as it happens. Remove soap buildup in the tub or shower regularly. Attach bath mats securely with double-sided non-slip rug tape. Do not have throw rugs and other things on the floor that can make you trip. What can I do in the bedroom? Use night lights. Make sure that you have a light by your bed that is easy to reach. Do not use any sheets or blankets that are too big for your bed. They should not hang down onto the floor. Have a firm chair that has side arms. You can use this for support while you get dressed. Do not have  throw rugs and other things on the floor that can make you trip. What can I do in the kitchen? Clean up any spills right away. Avoid walking on wet floors. Keep items that you use a lot in easy-to-reach places. If you need to reach something above you, use a strong step stool that has a grab bar. Keep electrical cords out of the way. Do not use floor polish or wax that makes floors slippery. If you must use wax, use non-skid floor wax. Do not have throw rugs and other things on the floor that can make you trip. What can I do with my stairs? Do not leave any items on the stairs. Make sure that there are handrails on both sides of the stairs and use them. Fix handrails that are broken or loose. Make sure that handrails are as long as the stairways. Check any carpeting to make sure that it is firmly attached to the stairs. Fix any carpet that is loose or worn. Avoid having throw rugs at the top or bottom of the stairs. If you do have throw rugs, attach them to the floor with carpet tape. Make sure that you have a light switch at the top of the stairs and the bottom of the stairs. If you do not have them, ask someone to add them for you.  What else can I do to help prevent falls? Wear shoes that: Do not have high heels. Have rubber bottoms. Are comfortable and fit you well. Are closed at the toe. Do not wear sandals. If you use a stepladder: Make sure that it is fully opened. Do not climb a closed stepladder. Make sure that both sides of the stepladder are locked into place. Ask someone to hold it for you, if possible. Clearly mark and make sure that you can see: Any grab bars or handrails. First and last steps. Where the edge of each step is. Use tools that help you move around (mobility aids) if they are needed. These include: Canes. Walkers. Scooters. Crutches. Turn on the lights when you go into a dark area. Replace any light bulbs as soon as they burn out. Set up your furniture so  you have a clear path. Avoid moving your furniture around. If any of your floors are uneven, fix them. If there are any pets around you, be aware of where they are. Review your medicines with your doctor. Some medicines can make you feel dizzy. This can increase your chance of falling. Ask your doctor what other things that you can do to help prevent falls. This information is not intended to replace advice given to you by your health care provider. Make sure you discuss any questions you have with your health care provider. Document Released: 02/22/2009 Document Revised: 10/04/2015 Document Reviewed: 06/02/2014 Elsevier Interactive Patient Education  2017 [employer].

## 2022-03-10 NOTE — Progress Notes (Signed)
Virtual Visit via Telephone Note  I connected with  Whitehead Whitehead on 03/10/22 at  2:30 PM EDT by telephone and verified that I am speaking with the correct person using two identifiers.  Location: Patient: home Provider: [facility] Persons participating in the virtual visit: patient/Nurse Health Advisor   I discussed the limitations, risks, security and privacy concerns of performing an evaluation and management service by telephone and the availability of in person appointments. The patient expressed understanding and agreed to proceed.  Interactive audio and video telecommunications were attempted between this nurse and patient, however failed, due to patient having technical difficulties OR patient did not have access to video capability.  We continued and completed visit with audio only.  Some vital signs may be absent or patient reported.   Whitehead David, LPN  Subjective:   Whitehead Whitehead is a 52 y.o. male who presents for an Initial Medicare Annual Wellness Visit.  Review of Systems     Cardiac Risk Factors include: advanced age (>90mn, >>29women);male gender     Objective:    There were no vitals filed for this visit. There is no height or weight on file to calculate BMI.     03/10/2022    2:34 PM 11/21/2021   11:26 AM 09/13/2021    1:00 PM 09/13/2021    6:05 AM 07/25/2021   10:53 AM 03/27/2021   12:17 PM 08/21/2020   11:43 AM  Advanced Directives  Does Patient Have a Medical Advance Directive? No No Yes Yes Yes Yes Yes  Type of Advance Directive   Living will Living will [place] Living will;Healthcare Power of [place]  Does patient want to make changes to medical advance directive?   No - Patient declined  No - Patient declined No - Patient declined   Copy of [place] AF[place] Chart?     No - copy requested No - copy requested No - copy requested  Would patient like information on  creating a medical advance directive? No - Patient declined    No - Patient declined No - Patient declined No - Patient declined    Current Medications (verified) Outpatient Encounter Medications as of 03/10/2022  Medication Sig   acyclovir (ZOVIRAX) 400 MG tablet Take 1 tablet (400 mg total) by mouth 2 (two) times daily.   amLODipine (NORVASC) 5 MG tablet Take 1 tablet (5 mg total) by mouth daily.   cetirizine (ZYRTEC) 10 MG tablet Take 10 mg by mouth daily.   fluconazole (DIFLUCAN) 200 MG tablet Take 1 tablet (200 mg total) by mouth daily.   fluticasone (FLONASE) 50 MCG/ACT nasal spray Place 2 sprays into both nostrils daily.   Prenatal Vit-Fe Fumarate-FA (PRENATAL MULTIVITAMIN) TABS tablet Take 1 tablet by mouth daily at 12 noon.   rosuvastatin (CRESTOR) 5 MG tablet Take 1 tablet (5 mg total) by mouth at bedtime.   venetoclax (VENCLEXTA) 100 MG tablet TAKE 1 TABLET (100 MG) BY MOUTH DAILY.   No facility-administered encounter medications on file as of 03/10/2022.    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   Sleep apnea    uses a cpap   Tick bite 09/17/2020   Past Surgical History:  Procedure Laterality Date   AXILLARY LYMPH NODE DISSECTION Right 04/16/2018   Procedure: AXILLARY LYMPH NODE DEEP EXCISION;  Surgeon: Fanny Skates, MD;  Location: [place];  Service: General;  Laterality: Right;   COLONOSCOPY N/A 04/01/2019   Procedure: COLONOSCOPY;  Surgeon: Danie Binder, MD;  [facility];  Service: Endoscopy;  Laterality: N/A;  12:45pm   ESOPHAGOGASTRODUODENOSCOPY N/A  04/01/2019   Procedure: ESOPHAGOGASTRODUODENOSCOPY (EGD);  Surgeon: Danie Binder, MD;  [facility];  Service: Endoscopy;  Laterality: N/A;   FINGER SURGERY     SHUNT REMOVAL N/A 09/13/2021   Procedure: SHUNT REMOVAL;  Surgeon: Ashok Pall, MD;  Location: [facility];  Service: Neurosurgery;  Laterality: N/A;  RM 19   VENTRICULOPERITONEAL SHUNT Right 02/18/2019   Procedure: SHUNT INSERTION VENTRICULAR-PERITONEAL;  Surgeon: Ashok Pall, MD;  Location: [facility] [place];  Service: Neurosurgery;  Laterality: Right;  SHUNT INSERTION VENTRICULAR-PERITONEAL   Family History  Problem Relation Age of Onset   Colon cancer Father 48       passed from [facility]   Colon cancer Paternal Uncle 12       passed from [place] History   Marital status: Married    Spouse name: Whitehead Whitehead   Number of children: 0   Years of education: 21   Highest education level: Not on file  Occupational History   Not on file  Tobacco Use   Smoking status: Never   Smokeless tobacco: Never  Vaping Use   Vaping Use: Never used  Substance and Sexual Activity   Alcohol use: Not Currently   Drug use: Never   Sexual activity: Yes  Other Topics Concern   Not on file  Social History Narrative   Right handed    Lives with wife, Whitehead Whitehead   Caffeine use: rare   Social Determinants of Health   Financial Resource Strain: Low Risk  (03/10/2022)   Overall Financial Resource Strain (CARDIA)    Difficulty of Paying Living Expenses: Not hard at all  Food Insecurity: No Food Insecurity (03/10/2022)   Hunger Vital Sign    Worried About Running Out of Food in the Last Year: Never true    [place] in the Last Year: Never true  Transportation Needs: No Transportation Needs (03/10/2022)   PRAPARE - Hydrologist (Medical): No    Lack of Transportation (Non-Medical): No  Physical Activity: Sufficiently Active (03/10/2022)   Exercise Vital Sign    Days of Exercise per Week: 5  days    Minutes of Exercise per Session: 30 min  Stress: No Stress Concern Present (03/10/2022)   [place]    Feeling of Stress : Not at all  Social Connections: Moderately Isolated (03/10/2022)   Social Connection and Isolation Panel [NHANES]    Frequency of Communication with Friends and Family: Not on file    Frequency of Social Gatherings with Friends and Family: More than three times a week    Attends Religious Services: Never    Marine scientist or Organizations: No    Attends Archivist Meetings: Never    Marital Status: Married    Tobacco Counseling Counseling given: Not Answered   Clinical Intake:  Pre-visit preparation completed: Yes  Pain :  No/denies pain     Nutritional Risks: None Diabetes: No  How often do you need to have someone help you when you read instructions, pamphlets, or other written materials from your doctor or pharmacy?: 1 - Never  Diabetic?no  Interpreter Needed?: No  Information entered by :: Kirke Shaggy, LPN   Activities of Daily Living    03/10/2022    2:36 PM 03/10/2022    5:54 AM  In your present state of health, do you have any difficulty performing the following activities:  Hearing? 0 0  Vision? 0 0  Difficulty concentrating or making decisions? 1 1  Walking or climbing stairs? 0 0  Dressing or bathing? 0 0  Doing errands, shopping? 0 0  Preparing Food and eating ? N N  Using the Toilet? N N  In the past six months, have you accidently leaked urine? N N  Do you have problems with loss of bowel control? N N  Managing your Medications? N N  Managing your Finances? N N  Housekeeping or managing your Housekeeping? N N    Patient Care Team: [name], MD as PCP - General (Family Medicine) Cordelia Poche, RN as Oncology Nurse Navigator Fields, Marga Melnick, MD (Inactive) as Consulting Physician (Gastroenterology) Volanda Napoleon, MD as  Medical Oncologist (Oncology)  Indicate any recent Medical Services you may have received from other than [facility] providers in the past year (date may be approximate).     Assessment:   This is a routine wellness examination for Ariv.  Hearing/Vision screen Hearing Screening - Comments:: No aids Vision Screening - Comments:: No glasses- [facility] [place] issues and exercise activities discussed: Current Exercise Habits: Home exercise routine, Type of exercise: walking, Time (Minutes): 30, Frequency (Times/Week): 5, Weekly Exercise (Minutes/Week): 150, Intensity: Mild   Goals Addressed             This Visit's Progress    DIET - EAT MORE FRUITS AND VEGETABLES         Depression Screen    03/10/2022    2:33 PM 10/02/2021    3:29 PM 08/27/2021   10:57 AM 02/26/2021   11:00 AM 10/03/2020    4:04 PM 03/12/2020    1:56 PM 04/13/2019    9:31 AM  PHQ 2/9 Scores  PHQ - 2 Score 0 0 0 0 0 0 0  PHQ- 9 Score 0          Fall Risk    03/10/2022    2:36 PM 03/10/2022    5:54 AM 08/27/2021   10:57 AM 02/26/2021   10:59 AM 10/03/2020    4:03 PM  Fall Risk   Falls in the past year? 0 0 0 0 0  Number falls in past yr: 0 0 0  0  Injury with Fall? 0 0 0  0  Risk for fall due to : No Fall Risks  No Fall Risks No Fall Risks   Follow up Falls prevention discussed;Falls evaluation completed  Falls evaluation completed Falls evaluation completed     FALL RISK PREVENTION PERTAINING TO THE HOME:  Any stairs in or around the home? Yes  If so, are there any without handrails? No  Home free of loose throw rugs in walkways, pet beds, electrical cords, etc? Yes  Adequate lighting in your home to reduce risk of falls? Yes   ASSISTIVE DEVICES UTILIZED TO PREVENT FALLS:  Life alert? No  Use of a cane, walker  or w/c? No  Grab bars in the bathroom? No  Shower chair or bench in shower? Yes  Elevated toilet seat or a handicapped toilet? No   Cognitive Function:         03/10/2022    2:38 PM  6CIT Screen  What Year? 0 points  What month? 0 points  What time? 0 points  Count back from 20 0 points  Months in reverse 0 points  Repeat phrase 0 points  Total Score 0 points    Immunizations Immunization History  Administered Date(s) Administered   Influenza,inj,Quad PF,6+ Mos 04/05/2018, 01/28/2019, 02/23/2020, 02/26/2021, 02/10/2022   Moderna Sars-Covid-2 Vaccination 07/06/2019, 08/03/2019    TDAP status: Due, Education has been provided regarding the importance of this vaccine. Advised may receive this vaccine at local pharmacy or Health Dept. Aware to provide a copy of the vaccination record if obtained from local pharmacy or Health Dept. Verbalized acceptance and understanding.  Flu Vaccine status: Up to date  Pneumococcal vaccine status: Declined,  Education has been provided regarding the importance of this vaccine but patient still declined. Advised may receive this vaccine at local pharmacy or Health Dept. Aware to provide a copy of the vaccination record if obtained from local pharmacy or Health Dept. Verbalized acceptance and understanding.   Covid-19 vaccine status: Completed vaccines  Qualifies for Shingles Vaccine? No   Zostavax completed No   Shingrix Completed?: No.    Education has been provided regarding the importance of this vaccine. Patient has been advised to call insurance company to determine out of pocket expense if they have not yet received this vaccine. Advised may also receive vaccine at local pharmacy or Health Dept. Verbalized acceptance and understanding.  Screening Tests Health Maintenance  Topic Date Due   TETANUS/TDAP  Never done   COVID-19 Vaccine (3 - Moderna risk series) 08/31/2019   Medicare Annual Wellness (AWV)  03/11/2023   COLONOSCOPY (Pts 45-40yr Insurance coverage will need to be confirmed)  03/31/2024   INFLUENZA VACCINE  Completed   Hepatitis C Screening  Completed   HIV Screening  Completed   HPV  VACCINES  Aged Out    Health Maintenance  Health Maintenance Due  Topic Date Due   TETANUS/TDAP  Never done   COVID-19 Vaccine (3 - Moderna risk series) 08/31/2019    Colorectal cancer screening: Type of screening: Colonoscopy. Completed 04/01/19. Repeat every 5 years  Lung Cancer Screening: (Low Dose CT Chest recommended if Age 49-80years, 30 pack-year currently smoking OR have quit w/in 15years.) does not qualify.   Additional Screening:  Hepatitis C Screening: does qualify; Completed 04/05/18  Vision Screening: Recommended annual ophthalmology exams for early detection of glaucoma and other disorders of the eye. Is the patient up to date with their annual eye exam?  Yes  Who is the provider or what is the name of the office in which the patient attends annual eye exams[place] [facility] [place] not established with a provider, would they like to be referred to a provider to establish care? No .   Dental Screening: Recommended annual dental exams for proper oral hygiene  Community Resource Referral / Chronic Care Management: CRR required this visit?  No   CCM required this visit?  No      Plan:     I have personally reviewed and noted the following in the patient's chart:   Medical and social history Use of alcohol, tobacco or illicit drugs  Current medications  and supplements including opioid prescriptions. Patient is not currently taking opioid prescriptions. Functional ability and status Nutritional status Physical activity Advanced directives List of other physicians Hospitalizations, surgeries, and ER visits in previous 12 months Vitals Screenings to include cognitive, depression, and falls Referrals and appointments  In addition, I have reviewed and discussed with patient certain preventive protocols, quality metrics, and best practice recommendations. A written personalized care plan for preventive services as well as general preventive health  recommendations were provided to patient.     Whitehead David, LPN   [id number]   Nurse Notes: none

## 2022-03-17 DIAGNOSIS — E782 Mixed hyperlipidemia: Secondary | ICD-10-CM | POA: Diagnosis not present

## 2022-03-17 DIAGNOSIS — I10 Essential (primary) hypertension: Secondary | ICD-10-CM | POA: Diagnosis not present

## 2022-03-19 LAB — LIPID PANEL
Chol/HDL Ratio: 4.4 ratio (ref 0.0–5.0)
Cholesterol, Total: 155 mg/dL (ref 100–199)
HDL: 35 mg/dL — ABNORMAL LOW (ref >39)
LDL Chol Calc (NIH): 86 mg/dL (ref 0–99)
Triglycerides: 198 mg/dL — ABNORMAL HIGH (ref 0–149)
VLDL Cholesterol Cal: 34 mg/dL (ref 5–40)

## 2022-03-19 LAB — MICROALBUMIN / CREATININE URINE RATIO
Creatinine, Urine: 151.2 mg/dL
Microalb/Creat Ratio: 5 mg/g creat (ref 0–29)
Microalbumin, Urine: 7.4 ug/mL

## 2022-03-24 ENCOUNTER — Encounter: Payer: Self-pay | Admitting: Hematology & Oncology

## 2022-03-24 ENCOUNTER — Inpatient Hospital Stay: Payer: Medicare Other | Admitting: Hematology & Oncology

## 2022-03-24 ENCOUNTER — Inpatient Hospital Stay: Payer: Medicare Other | Attending: Family

## 2022-03-24 VITALS — BP 142/90 | HR 69 | Temp 98.2°F | Resp 20 | Ht 69.0 in | Wt 250.0 lb

## 2022-03-24 DIAGNOSIS — C9110 Chronic lymphocytic leukemia of B-cell type not having achieved remission: Secondary | ICD-10-CM

## 2022-03-24 DIAGNOSIS — B451 Cerebral cryptococcosis: Secondary | ICD-10-CM | POA: Diagnosis not present

## 2022-03-24 LAB — CBC WITH DIFFERENTIAL (CANCER CENTER ONLY)
Abs Immature Granulocytes: 0.05 10*3/uL (ref 0.00–0.07)
Basophils Absolute: 0 10*3/uL (ref 0.0–0.1)
Basophils Relative: 1 %
Eosinophils Absolute: 0.1 10*3/uL (ref 0.0–0.5)
Eosinophils Relative: 2 %
HCT: 46.2 % (ref 39.0–52.0)
Hemoglobin: 15.6 g/dL (ref 13.0–17.0)
Immature Granulocytes: 1 %
Lymphocytes Relative: 30 %
Lymphs Abs: 1.6 10*3/uL (ref 0.7–4.0)
MCH: 27.6 pg (ref 26.0–34.0)
MCHC: 33.8 g/dL (ref 30.0–36.0)
MCV: 81.8 fL (ref 80.0–100.0)
Monocytes Absolute: 0.6 10*3/uL (ref 0.1–1.0)
Monocytes Relative: 11 %
Neutro Abs: 3 10*3/uL (ref 1.7–7.7)
Neutrophils Relative %: 55 %
Platelet Count: 156 10*3/uL (ref 150–400)
RBC: 5.65 MIL/uL (ref 4.22–5.81)
RDW: 12.2 % (ref 11.5–15.5)
WBC Count: 5.4 10*3/uL (ref 4.0–10.5)
nRBC: 0 % (ref 0.0–0.2)

## 2022-03-24 LAB — LACTATE DEHYDROGENASE: LDH: 164 U/L (ref 98–192)

## 2022-03-24 LAB — CMP (CANCER CENTER ONLY)
ALT: 47 U/L — ABNORMAL HIGH (ref 0–44)
AST: 23 U/L (ref 15–41)
Albumin: 4.7 g/dL (ref 3.5–5.0)
Alkaline Phosphatase: 67 U/L (ref 38–126)
Anion gap: 6 (ref 5–15)
BUN: 15 mg/dL (ref 6–20)
CO2: 28 mmol/L (ref 22–32)
Calcium: 9.7 mg/dL (ref 8.9–10.3)
Chloride: 109 mmol/L (ref 98–111)
Creatinine: 1.02 mg/dL (ref 0.61–1.24)
GFR, Estimated: >60 mL/min (ref >60)
Glucose, Bld: 124 mg/dL — ABNORMAL HIGH (ref 70–99)
Potassium: 4.7 mmol/L (ref 3.5–5.1)
Sodium: 143 mmol/L (ref 135–145)
Total Bilirubin: 0.5 mg/dL (ref 0.3–1.2)
Total Protein: 6.6 g/dL (ref 6.5–8.1)

## 2022-03-24 NOTE — Progress Notes (Signed)
Hematology and Oncology Follow Up Visit  David Whitehead [id number] 07-29-1970 51 y.o. 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  We last saw him back in July.  Since then, has been doing pretty well.  He really has had no complaints.  Has been working.  Work has been quite busy for him.  He has had no problems with the venetoclax.  He is on both venetoclax and Diflucan.  As long as he is on the venetoclax, he wants to stay on the Diflucan.  He had a history of the cryptococcal meningitis.  He has had no problems with diarrhea.  He has had no cough or shortness of breath.  He has had no bleeding.  There has been no rashes.  There has been no problems with fever.  He has had no headache.  He has not noted any swollen lymph nodes.  His last IgG level back in July was 670 mg/dL.  He is gaining weight.  He wants to try to lose a little bit of weight.  Overall, I would say his performance status is ECOG 0.    Medications:  Allergies as of 03/24/2022       Reactions   Bactrim [sulfamethoxazole-trimethoprim] Rash   Doxycycline Calcium Itching   "felt like pins and needles were in face after one dose."   Latex Other (See Comments)   Took skin off when removed tape   Allopurinol Itching, Rash   Amoxicillin Rash   Has patient had a PCN reaction causing immediate rash, facial/tongue/throat swelling, SOB or lightheadedness with hypotension: No Has patient had a PCN reaction causing severe rash involving mucus membranes or skin necrosis: No Has patient had a PCN reaction that required hospitalization: No Has patient had a PCN reaction occurring within the last 10 years: No If all of the above answers are "NO", then may proceed with Cephalosporin use.         Medication List        Accurate as of March 24, 2022 11:04 AM. If you have any questions, ask your nurse or doctor.          acyclovir 400 MG tablet Commonly known as: ZOVIRAX Take 1 tablet (400 mg total) by mouth 2 (two) times daily.   amLODipine 5 MG tablet Commonly known as: NORVASC Take 1 tablet (5 mg total) by mouth daily.   cetirizine 10 MG tablet Commonly known as: ZYRTEC Take 10 mg by mouth daily.   fluconazole 200 MG tablet Commonly known as: DIFLUCAN Take 1 tablet (200 mg total) by mouth daily.   fluticasone 50 MCG/ACT nasal spray Commonly known as: FLONASE Place 2 sprays into both nostrils daily.   prenatal multivitamin Tabs tablet Take 1 tablet by mouth daily at 12 noon.   rosuvastatin 5 MG tablet Commonly known as: Crestor Take 1 tablet (5 mg total) by mouth at bedtime.   Venclexta 100 MG tablet Generic drug: venetoclax TAKE 1 TABLET (100 MG) BY MOUTH DAILY.        Allergies:  Allergies  Allergen Reactions   Bactrim [Sulfamethoxazole-Trimethoprim] Rash   Doxycycline Calcium Itching    "felt like pins and needles were in face after one dose."  Latex Other (See Comments)    Took skin off when removed tape   Allopurinol Itching and Rash   Amoxicillin Rash    Has patient had a PCN reaction causing immediate rash, facial/tongue/throat swelling, SOB or lightheadedness with hypotension: No Has patient had a PCN reaction causing severe rash involving mucus membranes or skin necrosis: No Has patient had a PCN reaction that required hospitalization: No Has patient had a PCN reaction occurring within the last 10 years: No If all of the above answers are "NO", then may proceed with Cephalosporin use.     Past Medical History, Surgical history, Social history, and Family History were reviewed and updated.  Review of Systems: Review of Systems  Constitutional: Negative.   HENT: Negative.    Eyes: Negative.   Respiratory: Negative.     Cardiovascular: Negative.   Gastrointestinal: Negative.   Genitourinary: Negative.   Musculoskeletal: Negative.  Negative for myalgias.  Skin: Negative.   Neurological: Negative.   Endo/Heme/Allergies: Negative.   Psychiatric/Behavioral: Negative.       Physical Exam:  height is '5\' 9"'$  (1.753 m) and weight is 250 lb 0.6 oz (113.4 kg). His oral temperature is 98.2 F (36.8 C). His blood pressure is 142/90 (abnormal) and his pulse is 69. His respiration is 20 and oxygen saturation is 100%.   Wt Readings from Last 3 Encounters:  03/24/22 250 lb 0.6 oz (113.4 kg)  03/10/22 250 lb (113.4 kg)  03/03/22 250 lb (113.4 kg)    Physical Exam Vitals reviewed.  HENT:     Head: Normocephalic and atraumatic.  Eyes:     Pupils: Pupils are equal, round, and reactive to light.  Cardiovascular:     Rate and Rhythm: Normal rate and regular rhythm.     Heart sounds: Normal heart sounds.  Pulmonary:     Effort: Pulmonary effort is normal.     Breath sounds: Normal breath sounds.  Abdominal:     General: Bowel sounds are normal.     Palpations: Abdomen is soft.  Musculoskeletal:        General: No tenderness or deformity. Normal range of motion.     Cervical back: Normal range of motion.  Lymphadenopathy:     Cervical: No cervical adenopathy.  Skin:    General: Skin is warm and dry.     Findings: No erythema or rash.  Neurological:     Mental Status: He is alert and oriented to person, place, and time.  Psychiatric:        Behavior: Behavior normal.        Thought Content: Thought content normal.        Judgment: Judgment normal.     Lab Results  Component Value Date   WBC 5.4 03/24/2022   HGB 15.6 03/24/2022   HCT 46.2 03/24/2022   MCV 81.8 03/24/2022   PLT 156 03/24/2022   Lab Results  Component Value Date   FERRITIN 42 11/02/2019   IRON 48 11/02/2019   TIBC 329 11/02/2019   UIBC 280 11/02/2019   IRONPCTSAT 15 (L) 11/02/2019   Lab Results  Component Value Date   RBC  5.65 03/24/2022   Lab Results  Component Value Date   KPAFRELGTCHN 82.6 (H) 01/02/2020   LAMBDASER 4.3 (L) 01/02/2020   KAPLAMBRATIO 19.21 (H) 01/02/2020   Lab Results  Component Value Date   IGGSERUM 617 11/21/2021   IGA 9 (L) 11/21/2021   IGMSERUM <5 (L) 11/21/2021   Lab Results  Component Value Date  TOTALPROTELP 5.9 (L) 11/21/2020   ALBUMINELP 3.9 11/21/2020   A1GS 0.1 11/21/2020   A2GS 0.5 11/21/2020   BETS 0.9 11/21/2020   GAMS 0.5 11/21/2020   MSPIKE 0.2 (H) 11/21/2020     Chemistry      Component Value Date/Time   NA 143 03/24/2022 0957   NA 139 06/05/2021 0950   K 4.7 03/24/2022 0957   CL 109 03/24/2022 0957   CO2 28 03/24/2022 0957   BUN 15 03/24/2022 0957   BUN 18 06/05/2021 0950   CREATININE 1.02 03/24/2022 0957   CREATININE 1.07 02/26/2021 1130      Component Value Date/Time   CALCIUM 9.7 03/24/2022 0957   ALKPHOS 67 03/24/2022 0957   AST 23 03/24/2022 0957   ALT 47 (H) 03/24/2022 0957   BILITOT 0.5 03/24/2022 0957       Impression and Plan: Mr. Moring is a very pleasant 51 yo gentleman with an aggressive CLL by virtue of the cytogenetics.  He developed cryptococcal meningitis.  He was hospitalized for this.  Thankfully, he is doing quite nicely.  I will continue him on the venetoclax for right now.  I really do not think that we need to stop the venetoclax given the adverse cytogenetics of his CLL.  Thankfully, we can plan to get him back in about 4 months or so.  I will try to get him through the Winter.  I think this would be reasonable.  Maybe, he will have lost some weight.    Volanda Napoleon, MD 11/13/202311:04 AM

## 2022-03-25 ENCOUNTER — Other Ambulatory Visit (HOSPITAL_COMMUNITY): Payer: Self-pay

## 2022-03-25 LAB — IGG, IGA, IGM
IgA: 10 mg/dL — ABNORMAL LOW (ref 90–386)
IgG (Immunoglobin G), Serum: 603 mg/dL (ref 603–1613)
IgM (Immunoglobulin M), Srm: <5 mg/dL — ABNORMAL LOW (ref 20–172)

## 2022-03-31 ENCOUNTER — Other Ambulatory Visit (HOSPITAL_COMMUNITY): Payer: Self-pay

## 2022-04-07 ENCOUNTER — Other Ambulatory Visit: Payer: Self-pay | Admitting: Hematology & Oncology

## 2022-04-22 ENCOUNTER — Other Ambulatory Visit (HOSPITAL_COMMUNITY): Payer: Self-pay

## 2022-04-25 ENCOUNTER — Other Ambulatory Visit: Payer: Self-pay

## 2022-04-25 ENCOUNTER — Other Ambulatory Visit (HOSPITAL_COMMUNITY): Payer: Self-pay

## 2022-04-28 ENCOUNTER — Other Ambulatory Visit: Payer: Self-pay

## 2022-05-20 ENCOUNTER — Other Ambulatory Visit (HOSPITAL_COMMUNITY): Payer: Self-pay

## 2022-05-25 ENCOUNTER — Other Ambulatory Visit: Payer: Self-pay | Admitting: Hematology & Oncology

## 2022-05-26 ENCOUNTER — Other Ambulatory Visit (HOSPITAL_COMMUNITY): Payer: Self-pay

## 2022-05-26 ENCOUNTER — Other Ambulatory Visit: Payer: Self-pay

## 2022-05-26 ENCOUNTER — Encounter: Payer: Self-pay | Admitting: Hematology

## 2022-05-26 MED ORDER — ACYCLOVIR 400 MG PO TABS: 400.0000 mg | ORAL_TABLET | Freq: Two times a day (BID) | ORAL | 0 refills | Status: DC

## 2022-06-18 ENCOUNTER — Other Ambulatory Visit (HOSPITAL_COMMUNITY): Payer: Self-pay

## 2022-06-23 ENCOUNTER — Other Ambulatory Visit: Payer: Self-pay

## 2022-06-25 ENCOUNTER — Other Ambulatory Visit: Payer: Self-pay | Admitting: Hematology & Oncology

## 2022-06-25 DIAGNOSIS — C9110 Chronic lymphocytic leukemia of B-cell type not having achieved remission: Secondary | ICD-10-CM

## 2022-06-25 MED ORDER — ACYCLOVIR 400 MG PO TABS: 400.0000 mg | ORAL_TABLET | Freq: Two times a day (BID) | ORAL | 0 refills | Status: DC

## 2022-06-25 NOTE — Telephone Encounter (Signed)
Last refilled 05/26/22 # 60. Please advise for refills, thanks!

## 2022-07-14 ENCOUNTER — Telehealth: Payer: Self-pay | Admitting: Family Medicine

## 2022-07-14 ENCOUNTER — Other Ambulatory Visit: Payer: Self-pay | Admitting: Family Medicine

## 2022-07-14 MED ORDER — ONDANSETRON HCL 8 MG PO TABS: 8.0000 mg | ORAL_TABLET | Freq: Three times a day (TID) | ORAL | 14 refills | Status: AC | PRN

## 2022-07-14 NOTE — Telephone Encounter (Signed)
Front  This patient has COVID-please put him on the schedule with me today I will be doing a virtual visit with him at lunchtime.  Thank you-Dr. [name]

## 2022-07-15 ENCOUNTER — Other Ambulatory Visit: Payer: Self-pay | Admitting: Family Medicine

## 2022-07-15 ENCOUNTER — Encounter: Payer: Self-pay | Admitting: Hematology

## 2022-07-15 ENCOUNTER — Telehealth (INDEPENDENT_AMBULATORY_CARE_PROVIDER_SITE_OTHER): Payer: Medicare Other | Admitting: Family Medicine

## 2022-07-15 ENCOUNTER — Encounter: Payer: Self-pay | Admitting: Hematology & Oncology

## 2022-07-15 ENCOUNTER — Other Ambulatory Visit (HOSPITAL_COMMUNITY): Payer: Self-pay

## 2022-07-15 DIAGNOSIS — U071 COVID-19: Secondary | ICD-10-CM | POA: Diagnosis not present

## 2022-07-15 MED ORDER — NIRMATRELVIR/RITONAVIR (PAXLOVID)TABLET: 3.0000 | ORAL_TABLET | Freq: Two times a day (BID) | ORAL | 0 refills | Status: AC

## 2022-07-15 NOTE — Telephone Encounter (Signed)
I discussed this issue with him on the virtual visit he is aware

## 2022-07-15 NOTE — Telephone Encounter (Signed)
Nurses 1.  Please tell patient that I did touch base with his specialists 2.  Paxlovid is indicated, this was sent to [place], 3 capsules twice daily for 5 days #3 do not take Crestor, Diflucan,Venclexta while on Paxlovid Reduce amlodipine to half a tablet daily while on Paxlovid  I plan to do a virtual visit with the patient at lunch hour.  Please asked the patient how is he doing in regards to breathing, vomiting, and how is he feeling overall (If the patient feels that the visit should be in person we can manage this later in the afternoon)

## 2022-07-15 NOTE — Progress Notes (Signed)
   Subjective:    Patient ID: David Whitehead, male    DOB: 1971/03/24, 52 y.o.   MRN: [id number]  HPI  Patient to discuss positive Covid test Virtual Visit via Video Note  I connected with David Whitehead on 07/15/22 at 11:40 AM EST by a video enabled telemedicine application and verified that I am speaking with the correct person using two identifiers.  Location: Patient: home Provider: office   I discussed the limitations of evaluation and management by telemedicine and the availability of in person appointments. The patient expressed understanding and agreed to proceed.  History of Present Illness: Patient with about 3 days of head congestion sinus pressure not feeling good tested positive yesterday for COVID.  Denies wheezing difficulty breathing denies any vomiting today but did have vomiting yesterday.  Moderate headache yesterday minimal today a lot of fatigue and tiredness he does have underlying health issues including cancer treatments   Observations/Objective:   Assessment and Plan:   Follow Up Instructions:    I discussed the assessment and treatment plan with the patient. The patient was provided an opportunity to ask questions and all were answered. The patient agreed with the plan and demonstrated an understanding of the instructions.   The patient was advised to call back or seek an in-person evaluation if the symptoms worsen or if the condition fails to improve as anticipated.  I provided 15 minutes of non-face-to-face time during this encounter.    Review of Systems     Objective:   Physical Exam  Patient had virtual visit-video Appears to be in no distress Atraumatic Neuro able to relate and oriented No apparent resp distress Color normal       Assessment & Plan:  COVID Paxlovid This does not get along with several of his medicines They were stopped Amlodipine modified Patient aware of all of this Please see details of Paxlovid  prescription GFR looks good continue Paxil bid at normal dosing Warning signs discussed in detail Follow-up if problems May resume his medicines after stopping Paxlovid after 5 days

## 2022-07-24 ENCOUNTER — Other Ambulatory Visit (HOSPITAL_COMMUNITY): Payer: Self-pay

## 2022-07-24 ENCOUNTER — Encounter: Payer: Self-pay | Admitting: Family

## 2022-07-28 ENCOUNTER — Inpatient Hospital Stay (HOSPITAL_BASED_OUTPATIENT_CLINIC_OR_DEPARTMENT_OTHER): Payer: Medicare Other | Admitting: Family

## 2022-07-28 ENCOUNTER — Other Ambulatory Visit: Payer: Self-pay | Admitting: Hematology & Oncology

## 2022-07-28 ENCOUNTER — Inpatient Hospital Stay: Payer: Medicare Other | Attending: Hematology & Oncology

## 2022-07-28 ENCOUNTER — Encounter: Payer: Self-pay | Admitting: Family

## 2022-07-28 VITALS — BP 131/100 | HR 75 | Temp 97.8°F | Resp 18 | Wt 251.0 lb

## 2022-07-28 DIAGNOSIS — C9110 Chronic lymphocytic leukemia of B-cell type not having achieved remission: Secondary | ICD-10-CM

## 2022-07-28 DIAGNOSIS — B451 Cerebral cryptococcosis: Secondary | ICD-10-CM | POA: Insufficient documentation

## 2022-07-28 LAB — CBC WITH DIFFERENTIAL (CANCER CENTER ONLY)
Abs Immature Granulocytes: 0.06 10*3/uL (ref 0.00–0.07)
Basophils Absolute: 0.1 10*3/uL (ref 0.0–0.1)
Basophils Relative: 1 %
Eosinophils Absolute: 0.2 10*3/uL (ref 0.0–0.5)
Eosinophils Relative: 4 %
HCT: 44.9 % (ref 39.0–52.0)
Hemoglobin: 14.9 g/dL (ref 13.0–17.0)
Immature Granulocytes: 1 %
Lymphocytes Relative: 32 %
Lymphs Abs: 1.9 10*3/uL (ref 0.7–4.0)
MCH: 27.1 pg (ref 26.0–34.0)
MCHC: 33.2 g/dL (ref 30.0–36.0)
MCV: 81.8 fL (ref 80.0–100.0)
Monocytes Absolute: 0.5 10*3/uL (ref 0.1–1.0)
Monocytes Relative: 9 %
Neutro Abs: 3.2 10*3/uL (ref 1.7–7.7)
Neutrophils Relative %: 53 %
Platelet Count: 190 10*3/uL (ref 150–400)
RBC: 5.49 MIL/uL (ref 4.22–5.81)
RDW: 12.7 % (ref 11.5–15.5)
WBC Count: 6 10*3/uL (ref 4.0–10.5)
nRBC: 0 % (ref 0.0–0.2)

## 2022-07-28 LAB — CMP (CANCER CENTER ONLY)
ALT: 50 U/L — ABNORMAL HIGH (ref 0–44)
AST: 28 U/L (ref 15–41)
Albumin: 4.7 g/dL (ref 3.5–5.0)
Alkaline Phosphatase: 57 U/L (ref 38–126)
Anion gap: 8 (ref 5–15)
BUN: 15 mg/dL (ref 6–20)
CO2: 27 mmol/L (ref 22–32)
Calcium: 9.2 mg/dL (ref 8.9–10.3)
Chloride: 107 mmol/L (ref 98–111)
Creatinine: 1.05 mg/dL (ref 0.61–1.24)
GFR, Estimated: >60 mL/min (ref >60)
Glucose, Bld: 112 mg/dL — ABNORMAL HIGH (ref 70–99)
Potassium: 3.9 mmol/L (ref 3.5–5.1)
Sodium: 142 mmol/L (ref 135–145)
Total Bilirubin: 0.5 mg/dL (ref 0.3–1.2)
Total Protein: 6.6 g/dL (ref 6.5–8.1)

## 2022-07-28 LAB — LACTATE DEHYDROGENASE: LDH: 166 U/L (ref 98–192)

## 2022-07-28 NOTE — Progress Notes (Signed)
Hematology and Oncology Follow Up Visit  David Whitehead US:[id number] 20-Mar-1971 52 y.o. 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 He is doing well and has no complaints at this time. He had Covid 2 weeks ago and thankfully his symptoms were similar to a head cold and once he started Paxlovid they resolved.  No fever, chills, n/v, cough, rash, dizziness, SOB, chest pain, palpitations, abdominal pain or changes in bowel or bladder habits.  IgG level at last visit in November 2023 was 603 mg/dL.  No swelling, tenderness, numbness or tingling in his extremities.  No falls or syncope reported.  Appetite and hydration are good. Weight is stable at 251 lbs.   ECOG Performance Status: 1 - Symptomatic but completely ambulatory  Medications:  Allergies as of 07/28/2022       Reactions   Bactrim [sulfamethoxazole-trimethoprim] Rash   Doxycycline Calcium Itching   "felt like pins and needles were in face after one dose."   Latex Other (See Comments)   Took skin off when removed tape   Allopurinol Itching, Rash   Amoxicillin Rash   Has patient had a PCN reaction causing immediate rash, facial/tongue/throat swelling, SOB or lightheadedness with hypotension: No Has patient had a PCN reaction causing severe rash involving mucus membranes or skin necrosis: No Has patient had a PCN reaction that required hospitalization: No Has patient had a PCN reaction occurring within the last 10 years: No If all of the above answers are "NO", then may proceed with Cephalosporin use.        Medication List        Accurate as of July 28, 2022  2:08 PM. If you have any questions, ask your nurse or doctor.          acyclovir 400 MG tablet Commonly known as: ZOVIRAX Take  1 tablet (400 mg total) by mouth 2 (two) times daily.   amLODipine 5 MG tablet Commonly known as: NORVASC Take 1 tablet (5 mg total) by mouth daily.   cetirizine 10 MG tablet Commonly known as: ZYRTEC Take 10 mg by mouth daily.   fluconazole 200 MG tablet Commonly known as: DIFLUCAN Take 1 tablet (200 mg total) by mouth daily.   fluticasone 50 MCG/ACT nasal spray Commonly known as: FLONASE Place 2 sprays into both nostrils daily.   ondansetron 8 MG tablet Commonly known as: Zofran Take 1 tablet (8 mg total) by mouth every 8 (eight) hours as needed for nausea.   prenatal multivitamin Tabs tablet Take 1 tablet by mouth daily at 12 noon.   rosuvastatin 5 MG tablet Commonly known as: Crestor Take 1 tablet (5 mg total) by mouth at bedtime.   Venclexta 100 MG tablet Generic drug: venetoclax TAKE 1 TABLET (100 MG) BY MOUTH DAILY.        Allergies:  Allergies  Allergen Reactions   Bactrim [Sulfamethoxazole-Trimethoprim] Rash   Doxycycline Calcium Itching    "felt like pins and needles were in face after one dose."   Latex Other (See Comments)    Took skin off when removed tape   Allopurinol Itching and Rash   Amoxicillin Rash    Has patient had a PCN reaction causing immediate rash, facial/tongue/throat swelling, SOB or lightheadedness with  hypotension: No Has patient had a PCN reaction causing severe rash involving mucus membranes or skin necrosis: No Has patient had a PCN reaction that required hospitalization: No Has patient had a PCN reaction occurring within the last 10 years: No If all of the above answers are "NO", then may proceed with Cephalosporin use.     Past Medical History, Surgical history, Social history, and Family History were reviewed and updated.  Review of Systems: All other 10 point review of systems is negative.   Physical Exam:  weight is 251 lb (113.9 kg). His oral temperature is 97.8 F (36.6 C). His blood pressure is 131/100 (abnormal)  and his pulse is 75. His respiration is 18 and oxygen saturation is 100%.   Wt Readings from Last 3 Encounters:  07/28/22 251 lb (113.9 kg)  03/24/22 250 lb 0.6 oz (113.4 kg)  03/10/22 250 lb (113.4 kg)    Ocular: Sclerae unicteric, pupils equal, round and reactive to light Ear-nose-throat: Oropharynx clear, dentition fair Lymphatic: No cervical or supraclavicular adenopathy Lungs no rales or rhonchi, good excursion bilaterally Heart regular rate and rhythm, no murmur appreciated Abd soft, nontender, positive bowel sounds MSK no focal spinal tenderness, no joint edema Neuro: non-focal, well-oriented, appropriate affect Breasts: Deferred   Lab Results  Component Value Date   WBC 6.0 07/28/2022   HGB 14.9 07/28/2022   HCT 44.9 07/28/2022   MCV 81.8 07/28/2022   PLT 190 07/28/2022   Lab Results  Component Value Date   FERRITIN 42 11/02/2019   IRON 48 11/02/2019   TIBC 329 11/02/2019   UIBC 280 11/02/2019   IRONPCTSAT 15 (L) 11/02/2019   Lab Results  Component Value Date   RBC 5.49 07/28/2022   Lab Results  Component Value Date   KPAFRELGTCHN 82.6 (H) 01/02/2020   LAMBDASER 4.3 (L) 01/02/2020   KAPLAMBRATIO 19.21 (H) 01/02/2020   Lab Results  Component Value Date   IGGSERUM 603 03/24/2022   IGA 10 (L) 03/24/2022   IGMSERUM <5 (L) 03/24/2022   Lab Results  Component Value Date   TOTALPROTELP 5.9 (L) 11/21/2020   ALBUMINELP 3.9 11/21/2020   A1GS 0.1 11/21/2020   A2GS 0.5 11/21/2020   BETS 0.9 11/21/2020   GAMS 0.5 11/21/2020   MSPIKE 0.2 (H) 11/21/2020     Chemistry      Component Value Date/Time   NA 142 07/28/2022 1327   NA 139 06/05/2021 0950   K 3.9 07/28/2022 1327   CL 107 07/28/2022 1327   CO2 27 07/28/2022 1327   BUN 15 07/28/2022 1327   BUN 18 06/05/2021 0950   CREATININE 1.05 07/28/2022 1327   CREATININE 1.07 02/26/2021 1130      Component Value Date/Time   CALCIUM 9.2 07/28/2022 1327   ALKPHOS 57 07/28/2022 1327   AST 28 07/28/2022 1327    ALT 50 (H) 07/28/2022 1327   BILITOT 0.5 07/28/2022 1327       Impression and Plan: David Whitehead is a very pleasant 52 yo gentleman with an aggressive CLL by virtue of the cytogenetics. He developed cryptococcal meningitis and is followed by ID Dr. Tommy Medal.  He is doing well on Venetoclax and prophylactic Diflucan and will continue his same regimen.  Follow-up in 4 months.   Lottie Dawson, NP 3/18/20242:08 PM

## 2022-07-29 LAB — IGG, IGA, IGM
IgA: 10 mg/dL — ABNORMAL LOW (ref 90–386)
IgG (Immunoglobin G), Serum: 549 mg/dL — ABNORMAL LOW (ref 603–1613)
IgM (Immunoglobulin M), Srm: <5 mg/dL — ABNORMAL LOW (ref 20–172)

## 2022-07-29 LAB — KAPPA/LAMBDA LIGHT CHAINS
Kappa free light chain: 15.9 mg/L (ref 3.3–19.4)
Kappa, lambda light chain ratio: 3.31 — ABNORMAL HIGH (ref 0.26–1.65)
Lambda free light chains: 4.8 mg/L — ABNORMAL LOW (ref 5.7–26.3)

## 2022-07-29 MED ORDER — ACYCLOVIR 400 MG PO TABS: 400.0000 mg | ORAL_TABLET | Freq: Two times a day (BID) | ORAL | 0 refills | Status: DC

## 2022-07-31 LAB — PROTEIN ELECTROPHORESIS, SERUM, WITH REFLEX
A/G Ratio: 1.8 — ABNORMAL HIGH (ref 0.7–1.7)
Albumin ELP: 4.1 g/dL (ref 2.9–4.4)
Alpha-1-Globulin: 0.2 g/dL (ref 0.0–0.4)
Alpha-2-Globulin: 0.7 g/dL (ref 0.4–1.0)
Beta Globulin: 0.9 g/dL (ref 0.7–1.3)
Gamma Globulin: 0.6 g/dL (ref 0.4–1.8)
Globulin, Total: 2.3 g/dL (ref 2.2–3.9)
Total Protein ELP: 6.4 g/dL (ref 6.0–8.5)

## 2022-08-04 ENCOUNTER — Other Ambulatory Visit: Payer: Self-pay

## 2022-08-04 ENCOUNTER — Ambulatory Visit: Payer: Self-pay | Admitting: Family

## 2022-08-15 ENCOUNTER — Other Ambulatory Visit: Payer: Self-pay

## 2022-08-15 ENCOUNTER — Other Ambulatory Visit (HOSPITAL_COMMUNITY): Payer: Self-pay

## 2022-08-15 ENCOUNTER — Other Ambulatory Visit: Payer: Self-pay | Admitting: Hematology & Oncology

## 2022-08-15 MED ORDER — VENETOCLAX 100 MG PO TABS
ORAL_TABLET | ORAL | 6 refills | Status: DC
  Filled 2022-08-15: qty 30, 30d supply, fill #0
  Filled 2022-08-19 – 2022-08-20 (×2): qty 28, 28d supply, fill #0
  Filled 2022-09-10: qty 28, 28d supply, fill #1
  Filled 2022-10-08: qty 28, 28d supply, fill #2
  Filled 2022-11-07: qty 28, 28d supply, fill #3
  Filled 2022-12-02: qty 28, 28d supply, fill #4
  Filled 2023-01-02: qty 28, 28d supply, fill #5
  Filled 2023-01-26: qty 28, 28d supply, fill #6

## 2022-08-18 ENCOUNTER — Ambulatory Visit: Payer: Medicare Other | Admitting: Family Medicine

## 2022-08-18 ENCOUNTER — Encounter: Payer: Self-pay | Admitting: Family Medicine

## 2022-08-18 ENCOUNTER — Other Ambulatory Visit: Payer: Self-pay

## 2022-08-18 VITALS — BP 118/78 | HR 78 | Ht 69.0 in | Wt 252.4 lb

## 2022-08-18 DIAGNOSIS — E782 Mixed hyperlipidemia: Secondary | ICD-10-CM | POA: Diagnosis not present

## 2022-08-18 DIAGNOSIS — Z8042 Family history of malignant neoplasm of prostate: Secondary | ICD-10-CM | POA: Insufficient documentation

## 2022-08-18 DIAGNOSIS — I10 Essential (primary) hypertension: Secondary | ICD-10-CM

## 2022-08-18 DIAGNOSIS — D801 Nonfamilial hypogammaglobulinemia: Secondary | ICD-10-CM

## 2022-08-18 DIAGNOSIS — G911 Obstructive hydrocephalus: Secondary | ICD-10-CM | POA: Insufficient documentation

## 2022-08-18 MED ORDER — AMLODIPINE BESYLATE 5 MG PO TABS: 5.0000 mg | ORAL_TABLET | Freq: Every day | ORAL | 1 refills | Status: DC

## 2022-08-18 MED ORDER — ROSUVASTATIN CALCIUM 5 MG PO TABS: 5.0000 mg | ORAL_TABLET | Freq: Every day | ORAL | 1 refills | Status: DC

## 2022-08-18 MED ORDER — FLUTICASONE PROPIONATE 0.05 % EX CREA: TOPICAL_CREAM | CUTANEOUS | 0 refills | Status: AC

## 2022-08-18 NOTE — Progress Notes (Signed)
   Subjective:    Patient ID: David Whitehead, male    DOB: 01/25/1971, 52 y.o.   MRN: [id number]  HPI Patient arrives for 6 month follow up on blood pressure. Patient states he has a bug bite that from a week ago that is red and inflamed.  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  The patient does have hypertension.   Patient relates dietary measures try to minimize salt The importance of healthy diet and activity were discussed Patient relates compliance Patient does try to fit in walking trying to eat relatively healthy    Review of Systems     Objective:   Physical Exam  General-in no acute distress Eyes-no discharge Lungs-respiratory rate normal, CTA CV-no murmurs,RRR Extremities skin warm dry no edema Neuro grossly normal Behavior normal, alert       Assessment & Plan:  1. Primary hypertension HTN- patient seen for follow-up regarding HTN.   Diet, medication compliance, appropriate labs and refills were completed.   Importance of keeping blood pressure under good control to lessen the risk of complications discussed Regular follow-up visits discussed  - amLODipine (NORVASC) 5 MG tablet; Take 1 tablet (5 mg total) by mouth daily.  Dispense: 90 tablet; Refill: 1  2. Mixed hyperlipidemia Hyperlipidemia-importance of diet, weight control, activity, compliance with medications discussed.   Recent labs reviewed.   Any additional labs or refills ordered.   Importance of keeping under good control discussed. Regular follow-up visits discussed Check lipid weight results  3. Family history of prostate cancer Check PSA await results  History of cryptococcal meningitis doing well overall On antifungal  treatments per infectious disease Stable Platelets resolved Oncology manages his CLL as well as his other underlying illnesses  Follow-up here 6 months labs ordered

## 2022-08-19 ENCOUNTER — Other Ambulatory Visit: Payer: Self-pay

## 2022-08-20 ENCOUNTER — Other Ambulatory Visit (HOSPITAL_COMMUNITY): Payer: Self-pay

## 2022-08-20 ENCOUNTER — Other Ambulatory Visit: Payer: Self-pay

## 2022-08-21 DIAGNOSIS — E782 Mixed hyperlipidemia: Secondary | ICD-10-CM | POA: Diagnosis not present

## 2022-08-21 DIAGNOSIS — Z8042 Family history of malignant neoplasm of prostate: Secondary | ICD-10-CM | POA: Diagnosis not present

## 2022-08-22 LAB — LIPID PANEL
Chol/HDL Ratio: 4.9 ratio (ref 0.0–5.0)
Cholesterol, Total: 153 mg/dL (ref 100–199)
HDL: 31 mg/dL — ABNORMAL LOW (ref >39)
LDL Chol Calc (NIH): 75 mg/dL (ref 0–99)
Triglycerides: 286 mg/dL — ABNORMAL HIGH (ref 0–149)
VLDL Cholesterol Cal: 47 mg/dL — ABNORMAL HIGH (ref 5–40)

## 2022-08-22 LAB — PSA: Prostate Specific Ag, Serum: 1.4 ng/mL (ref 0.0–4.0)

## 2022-09-01 ENCOUNTER — Other Ambulatory Visit: Payer: Self-pay | Admitting: Hematology & Oncology

## 2022-09-01 DIAGNOSIS — C9110 Chronic lymphocytic leukemia of B-cell type not having achieved remission: Secondary | ICD-10-CM

## 2022-09-01 MED ORDER — ACYCLOVIR 400 MG PO TABS
400.0000 mg | ORAL_TABLET | Freq: Two times a day (BID) | ORAL | 0 refills | Status: DC
Start: 2022-09-01 — End: 2022-09-29

## 2022-09-01 NOTE — Telephone Encounter (Signed)
Last refilled 07/29/22 #60 with 0 refills.

## 2022-09-10 ENCOUNTER — Other Ambulatory Visit (HOSPITAL_COMMUNITY): Payer: Self-pay

## 2022-09-12 ENCOUNTER — Other Ambulatory Visit (HOSPITAL_COMMUNITY): Payer: Self-pay

## 2022-09-29 ENCOUNTER — Other Ambulatory Visit: Payer: Self-pay | Admitting: Hematology & Oncology

## 2022-09-29 DIAGNOSIS — C9110 Chronic lymphocytic leukemia of B-cell type not having achieved remission: Secondary | ICD-10-CM

## 2022-09-29 MED ORDER — ACYCLOVIR 400 MG PO TABS
400.0000 mg | ORAL_TABLET | Freq: Two times a day (BID) | ORAL | 2 refills | Status: DC
Start: 2022-09-29 — End: 2022-12-29

## 2022-10-08 ENCOUNTER — Other Ambulatory Visit: Payer: Self-pay

## 2022-10-13 ENCOUNTER — Other Ambulatory Visit (HOSPITAL_COMMUNITY): Payer: Self-pay

## 2022-11-05 ENCOUNTER — Other Ambulatory Visit (HOSPITAL_COMMUNITY): Payer: Self-pay

## 2022-11-07 ENCOUNTER — Other Ambulatory Visit (HOSPITAL_COMMUNITY): Payer: Self-pay

## 2022-11-24 ENCOUNTER — Inpatient Hospital Stay: Payer: Medicare Other | Attending: Hematology & Oncology

## 2022-11-24 ENCOUNTER — Inpatient Hospital Stay: Payer: Medicare Other | Admitting: Family

## 2022-11-24 ENCOUNTER — Encounter: Payer: Self-pay | Admitting: Family

## 2022-11-24 VITALS — BP 137/102 | HR 72 | Temp 97.7°F | Resp 18 | Wt 246.5 lb

## 2022-11-24 DIAGNOSIS — B451 Cerebral cryptococcosis: Secondary | ICD-10-CM | POA: Insufficient documentation

## 2022-11-24 DIAGNOSIS — C9110 Chronic lymphocytic leukemia of B-cell type not having achieved remission: Secondary | ICD-10-CM

## 2022-11-24 DIAGNOSIS — D801 Nonfamilial hypogammaglobulinemia: Secondary | ICD-10-CM

## 2022-11-24 LAB — CBC WITH DIFFERENTIAL (CANCER CENTER ONLY)
Abs Immature Granulocytes: 0.05 10*3/uL (ref 0.00–0.07)
Basophils Absolute: 0.1 10*3/uL (ref 0.0–0.1)
Basophils Relative: 1 %
Eosinophils Absolute: 0.2 10*3/uL (ref 0.0–0.5)
Eosinophils Relative: 3 %
HCT: 45.8 % (ref 39.0–52.0)
Hemoglobin: 15.7 g/dL (ref 13.0–17.0)
Immature Granulocytes: 1 %
Lymphocytes Relative: 39 %
Lymphs Abs: 2.4 10*3/uL (ref 0.7–4.0)
MCH: 28.2 pg (ref 26.0–34.0)
MCHC: 34.3 g/dL (ref 30.0–36.0)
MCV: 82.2 fL (ref 80.0–100.0)
Monocytes Absolute: 0.5 10*3/uL (ref 0.1–1.0)
Monocytes Relative: 8 %
Neutro Abs: 3 10*3/uL (ref 1.7–7.7)
Neutrophils Relative %: 48 %
Platelet Count: 151 10*3/uL (ref 150–400)
RBC: 5.57 MIL/uL (ref 4.22–5.81)
RDW: 12.1 % (ref 11.5–15.5)
WBC Count: 6.2 10*3/uL (ref 4.0–10.5)
nRBC: 0 % (ref 0.0–0.2)

## 2022-11-24 LAB — CMP (CANCER CENTER ONLY)
ALT: 51 U/L — ABNORMAL HIGH (ref 0–44)
AST: 26 U/L (ref 15–41)
Albumin: 4.6 g/dL (ref 3.5–5.0)
Alkaline Phosphatase: 54 U/L (ref 38–126)
Anion gap: 8 (ref 5–15)
BUN: 12 mg/dL (ref 6–20)
CO2: 28 mmol/L (ref 22–32)
Calcium: 9.3 mg/dL (ref 8.9–10.3)
Chloride: 106 mmol/L (ref 98–111)
Creatinine: 1.03 mg/dL (ref 0.61–1.24)
GFR, Estimated: >60 mL/min (ref >60)
Glucose, Bld: 103 mg/dL — ABNORMAL HIGH (ref 70–99)
Potassium: 3.7 mmol/L (ref 3.5–5.1)
Sodium: 142 mmol/L (ref 135–145)
Total Bilirubin: 0.5 mg/dL (ref 0.3–1.2)
Total Protein: 6.5 g/dL (ref 6.5–8.1)

## 2022-11-24 LAB — LACTATE DEHYDROGENASE: LDH: 177 U/L (ref 98–192)

## 2022-11-24 NOTE — Progress Notes (Signed)
Hematology and Oncology Follow Up Visit  David Whitehead [id number] [birth year] 52 y.o. 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 He is doing quite well and has no complaints at this time.  His counts on CBC with diff today look great. He is doing well on Venetoclax and taking as prescribed.  He has had no issue with infections. No fever, chills, n/v, cough, rash, dizziness, SOB, chest pain, palpitations, abdominal pain/bloating or changes in bowel or bladder habits.  No hot flashes or night sweats.  No blood loss, abnormal bruising or petechiae.  No swelling, tenderness, numbness or tingling in his extremities. No falls or syncope.  Appetite and hydration are good. Weight is stable at 246 lbs.   ECOG Performance Status: 1 - Symptomatic but completely ambulatory  Medications:  Allergies as of 11/24/2022       Reactions   Bactrim [sulfamethoxazole-trimethoprim] Rash   Doxycycline Calcium Itching   "felt like pins and needles were in face after one dose."   Latex Other (See Comments)   Took skin off when removed tape   Allopurinol Itching, Rash   Amoxicillin Rash   Has patient had a PCN reaction causing immediate rash, facial/tongue/throat swelling, SOB or lightheadedness with hypotension: No Has patient had a PCN reaction causing severe rash involving mucus membranes or skin necrosis: No Has patient had a PCN reaction that required hospitalization: No Has patient had a PCN reaction occurring within the last 10 years: No If all of the above answers are "NO", then may proceed with Cephalosporin use.        Medication List        Accurate as of November 24, 2022  1:08 PM. If you have any questions, ask your nurse or doctor.          acyclovir  400 MG tablet Commonly known as: ZOVIRAX Take 1 tablet (400 mg total) by mouth 2 (two) times daily.   amLODipine 5 MG tablet Commonly known as: NORVASC Take 1 tablet (5 mg total) by mouth daily.   cetirizine 10 MG tablet Commonly known as: ZYRTEC Take 10 mg by mouth daily.   fluconazole 200 MG tablet Commonly known as: DIFLUCAN Take 1 tablet (200 mg total) by mouth daily.   fluticasone 0.05 % cream Commonly known as: CUTIVATE Apply bid prn to rash   fluticasone 50 MCG/ACT nasal spray Commonly known as: FLONASE Place 2 sprays into both nostrils daily.   ondansetron 8 MG tablet Commonly known as: Zofran Take 1 tablet (8 mg total) by mouth every 8 (eight) hours as needed for nausea.   prenatal multivitamin Tabs tablet Take 1 tablet by mouth daily at 12 noon.   rosuvastatin 5 MG tablet Commonly known as: Crestor Take 1 tablet (5 mg total) by mouth at bedtime.   Venclexta 100 MG tablet Generic drug: venetoclax TAKE 1 TABLET (100 MG) BY MOUTH DAILY.        Allergies:  Allergies  Allergen Reactions   Bactrim [Sulfamethoxazole-Trimethoprim] Rash   Doxycycline Calcium Itching    "felt like pins and needles were in face after one dose."   Latex Other (See Comments)    Took skin off when removed tape   Allopurinol Itching and Rash  Amoxicillin Rash    Has patient had a PCN reaction causing immediate rash, facial/tongue/throat swelling, SOB or lightheadedness with hypotension: No Has patient had a PCN reaction causing severe rash involving mucus membranes or skin necrosis: No Has patient had a PCN reaction that required hospitalization: No Has patient had a PCN reaction occurring within the last 10 years: No If all of the above answers are "NO", then may proceed with Cephalosporin use.     Past Medical History, Surgical history, Social history, and Family History were reviewed and updated.  Review of Systems: All other 10 point review of systems is negative.    Physical Exam:  weight is 246 lb 8 oz (111.8 kg). His oral temperature is 97.7 F (36.5 C). His blood pressure is 137/102 (abnormal) and his pulse is 72. His respiration is 18 and oxygen saturation is 99%.   Wt Readings from Last 3 Encounters:  11/24/22 246 lb 8 oz (111.8 kg)  08/18/22 252 lb 6.4 oz (114.5 kg)  07/28/22 251 lb (113.9 kg)    Ocular: Sclerae unicteric, pupils equal, round and reactive to light Ear-nose-throat: Oropharynx clear, dentition fair Lymphatic: No cervical or supraclavicular adenopathy Lungs no rales or rhonchi, good excursion bilaterally Heart regular rate and rhythm, no murmur appreciated Abd soft, nontender, positive bowel sounds MSK no focal spinal tenderness, no joint edema Neuro: non-focal, well-oriented, appropriate affect Breasts: Deferred   Lab Results  Component Value Date   WBC 6.2 11/24/2022   HGB 15.7 11/24/2022   HCT 45.8 11/24/2022   MCV 82.2 11/24/2022   PLT 151 11/24/2022   Lab Results  Component Value Date   FERRITIN 42 11/02/2019   IRON 48 11/02/2019   TIBC 329 11/02/2019   UIBC 280 11/02/2019   IRONPCTSAT 15 (L) 11/02/2019   Lab Results  Component Value Date   RBC 5.57 11/24/2022   Lab Results  Component Value Date   KPAFRELGTCHN 15.9 07/28/2022   LAMBDASER 4.8 (L) 07/28/2022   KAPLAMBRATIO 3.31 (H) 07/28/2022   Lab Results  Component Value Date   IGGSERUM 549 (L) 07/28/2022   IGA 10 (L) 07/28/2022   IGMSERUM <5 (L) 07/28/2022   Lab Results  Component Value Date   TOTALPROTELP 6.4 07/28/2022   ALBUMINELP 4.1 07/28/2022   A1GS 0.2 07/28/2022   A2GS 0.7 07/28/2022   BETS 0.9 07/28/2022   GAMS 0.6 07/28/2022   MSPIKE Not Observed 07/28/2022     Chemistry      Component Value Date/Time   NA 142 07/28/2022 1327   NA 139 06/05/2021 0950   K 3.9 07/28/2022 1327   CL 107 07/28/2022 1327   CO2 27 07/28/2022 1327   BUN 15 07/28/2022 1327   BUN 18 06/05/2021 0950   CREATININE 1.05 07/28/2022 1327    CREATININE 1.07 02/26/2021 1130      Component Value Date/Time   CALCIUM 9.2 07/28/2022 1327   ALKPHOS 57 07/28/2022 1327   AST 28 07/28/2022 1327   ALT 50 (H) 07/28/2022 1327   BILITOT 0.5 07/28/2022 1327       Impression and Plan: David Whitehead is a very pleasant 52 yo gentleman with an aggressive CLL by virtue of the cytogenetics. He developed cryptococcal meningitis and is followed by ID Dr. Daiva Eves.  He is doing well on Venetoclax and prophylactic Diflucan and will continue his same regimen.  Follow-up in 4 months.    Eileen Stanford, NP 7/15/20241:08 PM

## 2022-11-25 LAB — KAPPA/LAMBDA LIGHT CHAINS
Kappa free light chain: 13 mg/L (ref 3.3–19.4)
Kappa, lambda light chain ratio: 2.83 — ABNORMAL HIGH (ref 0.26–1.65)
Lambda free light chains: 4.6 mg/L — ABNORMAL LOW (ref 5.7–26.3)

## 2022-11-26 LAB — PROTEIN ELECTROPHORESIS, SERUM
A/G Ratio: 2.1 — ABNORMAL HIGH (ref 0.7–1.7)
Albumin ELP: 4.1 g/dL (ref 2.9–4.4)
Alpha-1-Globulin: 0.2 g/dL (ref 0.0–0.4)
Alpha-2-Globulin: 0.5 g/dL (ref 0.4–1.0)
Beta Globulin: 0.9 g/dL (ref 0.7–1.3)
Gamma Globulin: 0.4 g/dL (ref 0.4–1.8)
Globulin, Total: 2 g/dL — ABNORMAL LOW (ref 2.2–3.9)
Total Protein ELP: 6.1 g/dL (ref 6.0–8.5)

## 2022-11-26 LAB — IGG, IGA, IGM
IgA: 9 mg/dL — ABNORMAL LOW (ref 90–386)
IgG (Immunoglobin G), Serum: 520 mg/dL — ABNORMAL LOW (ref 603–1613)
IgM (Immunoglobulin M), Srm: <5 mg/dL — ABNORMAL LOW (ref 20–172)

## 2022-11-27 ENCOUNTER — Other Ambulatory Visit: Payer: Self-pay

## 2022-12-02 ENCOUNTER — Other Ambulatory Visit (HOSPITAL_COMMUNITY): Payer: Self-pay

## 2022-12-05 ENCOUNTER — Other Ambulatory Visit: Payer: Self-pay

## 2022-12-15 DIAGNOSIS — K080 Exfoliation of teeth due to systemic causes: Secondary | ICD-10-CM | POA: Diagnosis not present

## 2022-12-29 ENCOUNTER — Other Ambulatory Visit: Payer: Self-pay | Admitting: Hematology & Oncology

## 2022-12-29 DIAGNOSIS — C9110 Chronic lymphocytic leukemia of B-cell type not having achieved remission: Secondary | ICD-10-CM

## 2023-01-02 ENCOUNTER — Other Ambulatory Visit (HOSPITAL_COMMUNITY): Payer: Self-pay

## 2023-01-26 ENCOUNTER — Other Ambulatory Visit (HOSPITAL_COMMUNITY): Payer: Self-pay

## 2023-02-02 ENCOUNTER — Other Ambulatory Visit: Payer: Self-pay | Admitting: Hematology & Oncology

## 2023-02-02 DIAGNOSIS — C9110 Chronic lymphocytic leukemia of B-cell type not having achieved remission: Secondary | ICD-10-CM

## 2023-02-16 ENCOUNTER — Encounter: Payer: Self-pay | Admitting: Family Medicine

## 2023-02-16 ENCOUNTER — Ambulatory Visit: Payer: Medicare Other | Admitting: Family Medicine

## 2023-02-16 VITALS — BP 120/88 | HR 80 | Temp 98.5°F | Ht 69.0 in | Wt 241.6 lb

## 2023-02-16 DIAGNOSIS — I10 Essential (primary) hypertension: Secondary | ICD-10-CM

## 2023-02-16 DIAGNOSIS — E785 Hyperlipidemia, unspecified: Secondary | ICD-10-CM | POA: Diagnosis not present

## 2023-02-16 DIAGNOSIS — Z23 Encounter for immunization: Secondary | ICD-10-CM

## 2023-02-16 MED ORDER — AMLODIPINE BESYLATE 5 MG PO TABS
5.0000 mg | ORAL_TABLET | Freq: Every day | ORAL | 1 refills | Status: DC
Start: 2023-02-16 — End: 2023-11-06

## 2023-02-16 MED ORDER — ROSUVASTATIN CALCIUM 5 MG PO TABS: 5.0000 mg | ORAL_TABLET | Freq: Every day | ORAL | 1 refills | Status: DC

## 2023-02-16 NOTE — Progress Notes (Signed)
Subjective:    Patient ID: David Whitehead, male    DOB: [birth year], 52 y.o.   MRN: [id number]  Discussed the use of AI scribe software for clinical note transcription with the patient, who gave verbal consent to proceed.  History of Present Illness   The patient, with a history of cancer and cryptococcal meningitis, reports a stable condition with no new symptoms since the last oncology visit in July. He is scheduled for a follow-up appointment in November. The patient is also under the care of an infectious disease specialist, seen annually, primarily for the management of the previous cryptococcal meningitis. The patient has chosen to continue suppressive therapy with Diflucan, taken once daily, to minimize the risk of recurrence due to frequent outdoor activities.  The patient has expressed an interest in starting Pilates and resistance training at home to improve overall strength and fitness, noting a decrease in physical strength. He has equipment at home and plans to incorporate resistance training and moderate-paced walking into his routine.  The patient has a history of shingles and takes acyclovir twice daily for prevention. The patient also mentioned a family history of osteoporosis and plans to discuss the risk of osteoporosis, given his medical history and treatments, with their oncologist.  The patient's last cholesterol check was in April, with a goal to keep the LDL around 70 or less. He is due for a colonoscopy in November 2025, five years from the last one. The patient is also considering a pneumococcal vaccine, given his age and underlying conditions, but plans to discuss this with the infectious disease specialist at the next visit.  The patient is on amlodipine 5mg  daily for blood pressure control and cholesterol 5mg  every evening. He has noticed some swelling in his ankles, particularly after working. The patient's blood pressure readings during the visit were slightly  elevated, and he is considering lifestyle modifications, including weight loss and increased physical activity, to improve control.         Review of Systems     Objective:    Physical Exam   VITALS: BP initially 120/88, subsequently 114/86 CHEST: Lungs clear to auscultation. CARDIOVASCULAR: Heart sounds normal. EXTREMITIES: Mild ankle edema with indentation upon pressure.           Assessment & Plan:  Assessment and Plan    Cancer Undergoing regular follow-ups with oncology every four months. Next appointment scheduled for November with Dr. Tawanna Sat. -Continue current follow-up schedule with oncology.  Cryptococcal Meningitis In remission, currently on suppressive therapy with Diflucan 1 tablet daily. Patient prefers to continue Diflucan for suppression due to outdoor activities. -Continue Diflucan 1 tablet daily for suppression. -Annual follow-up with infectious disease, next appointment in two weeks.  Hypertension Blood pressure slightly elevated at 114/86. Patient is currently on Amlodipine 5mg  daily. Discussed the benefits of regular exercise and weight loss on blood pressure control. -Continue Amlodipine 5mg  daily. -Encourage regular exercise, including resistance training and interval walking, and healthy eating habits. -Recheck blood pressure in six months or sooner if needed.  Hyperlipidemia Last cholesterol check in April was within target range. Patient is currently on cholesterol medication 5mg  every evening. -Continue cholesterol medication 5mg  every evening. -Encourage healthy eating habits.  General Health Maintenance -Discussed the possibility of pneumococcal and shingles vaccines. Patient to discuss with infectious disease specialist and oncologist. -Next colonoscopy due in November 2025. -Continue with planned exercise regimen to improve overall health and strength. -Comprehensive blood work due in six months.     Labs  reviewed 1. Needs flu  shot Today - Flu vaccine trivalent PF, 6mos and older(Flulaval,Afluria,Fluarix,Fluzone)  2. Primary hypertension Continue blood pressure medicine healthy diet - amLODipine (NORVASC) 5 MG tablet; Take 1 tablet (5 mg total) by mouth daily.  Dispense: 90 tablet; Refill: 1  3. Hyperlipidemia, unspecified hyperlipidemia type Check lipid profile.  await labs continue statin 5 mg daily - Lipid panel  I encouraged patient to consider getting pneumococcal 20 vaccine he will discuss it with infectious disease

## 2023-02-25 ENCOUNTER — Other Ambulatory Visit: Payer: Self-pay | Admitting: Hematology & Oncology

## 2023-02-25 ENCOUNTER — Other Ambulatory Visit: Payer: Self-pay

## 2023-02-25 MED ORDER — VENETOCLAX 100 MG PO TABS
ORAL_TABLET | ORAL | 6 refills | Status: DC
  Filled 2023-02-25: qty 28, 28d supply, fill #0
  Filled 2023-03-23: qty 28, 28d supply, fill #1
  Filled 2023-04-16: qty 28, 28d supply, fill #2
  Filled 2023-05-20: qty 28, 28d supply, fill #3
  Filled 2023-06-09: qty 28, 28d supply, fill #4
  Filled 2023-07-10: qty 28, 28d supply, fill #5
  Filled 2023-08-06: qty 28, 28d supply, fill #6

## 2023-02-25 NOTE — Progress Notes (Signed)
Specialty Pharmacy Refill Coordination Note  David Whitehead is a 52 y.o. male contacted today regarding refills of specialty medication(s) Venetoclax   Patient requested Delivery   Delivery date: 03/03/23   Verified address: [address]  [place] [address]   Medication will be filled on 03/02/23.  Refill pending approval. Call if delayed.

## 2023-03-01 ENCOUNTER — Encounter: Payer: Self-pay | Admitting: Hematology

## 2023-03-01 NOTE — Progress Notes (Signed)
Subjective:   Complaint     Patient ID: David Whitehead, male    DOB: 13-Jan-1971, 52 y.o.   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  He underwent VP shunt placement by Dr. Mikal Plane while in the hospital for control of his high intracranial pressures.  He completed nearly a month of amphotericin with flucytosine.  Towards the end of his month of therapy he did develop a drug rash and acute kidney injury both attributed to the amphotericin.  He was given prednisone for 3 days and still had the rash at discharge.   He had follow-up labs as an outpatient with his primary care physician which showed his creatinine was still around the same range as it was when he was discharged from the hospital.  He thought the rash was getting worse but actually since he was seen by the PCP it is actually been improving.   Per Dr. Drue Second the rash was largely on his flank and torso but extended to his extremities when it was at its worse.   The patient confirmed this and showed me that the rash had disappeared from his lower extremities and his back.  It also dissappeared from his torso.  T   I last saw him in clinic in early November 2020 and he was having resolution of this rash.  He  remains currently on venotoclax with Dr. Myna Hidalgo for his CLL.  He has no headaches or symptoms that would be concerning for recurrence of his cryptococcal meningitis.   He been feeling relatively well recently though he does suffer from fatigue which he attributes to his chemotherapy.    He remains on fluconazole and continues on venotoclax--the latter requiring less of a dose due to drug drug interaction with fluconazole.  For his last visit he had noticed an area on his abdomen in the past several weeks that is erythematous but not esp tender but seems to track along where his VP shunt tracks.  Ultimately he was  seen by Dr. Franky Macho and shunt was removed and cultures taken which yielded a Propionibacterium.  We treated with minocycline for 2 weeks and he is feeling well he has no headaches or symptoms of meningitis having been off antibiotics.      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   Sleep apnea    uses a cpap   Tick bite 09/17/2020    Past Surgical History:  Procedure Laterality Date   AXILLARY LYMPH NODE DISSECTION Right 04/16/2018   Procedure: AXILLARY LYMPH NODE DEEP EXCISION;  Surgeon: Claud Kelp, MD;  Location[facility] OR;  Service: General;  Laterality: Right;   COLONOSCOPY N/A 04/01/2019   Procedure: COLONOSCOPY;  Surgeon: West Bali, MD;  Location: [facility] ENDO SUITE;  Service: Endoscopy;  Laterality: N/A;  12:45pm   ESOPHAGOGASTRODUODENOSCOPY N/A 04/01/2019   Procedure: ESOPHAGOGASTRODUODENOSCOPY (EGD);  Surgeon: West Bali, MD;  Location: [facility] ENDO SUITE;  Service: Endoscopy;  Laterality: N/A;   FINGER SURGERY     SHUNT REMOVAL N/A 09/13/2021   Procedure: SHUNT REMOVAL;  Surgeon:  Coletta Memos, MD;  Location: [facility] OR;  Service: Neurosurgery;  Laterality: N/A;  RM 19   VENTRICULOPERITONEAL SHUNT Right 02/18/2019   Procedure: SHUNT INSERTION VENTRICULAR-PERITONEAL;  Surgeon: Coletta Memos, MD;  Location: [facility] OR;  Service: Neurosurgery;  Laterality: Right;  SHUNT INSERTION VENTRICULAR-PERITONEAL    Family History  Problem Relation Age of Onset   Colon cancer Father 54       passed from [facility]   Colon cancer Paternal Uncle 27       passed from [facility]      Social History   Socioeconomic History   Marital status: Married    Spouse name: Delice Bison   Number of children: 0   Years of education: 13   Highest education  level: Associate degree: occupational, Scientist, product/process development, or vocational program  Occupational History   Not on file  Tobacco Use   Smoking status: Never   Smokeless tobacco: Never  Vaping Use   Vaping status: Never Used  Substance and Sexual Activity   Alcohol use: Not Currently   Drug use: Never   Sexual activity: Yes  Other Topics Concern   Not on file  Social History Narrative   Right handed    Lives with wife, Delice Bison   Caffeine use: rare   Social Determinants of Health   Financial Resource Strain: Low Risk  (08/15/2022)   Overall Financial Resource Strain (CARDIA)    Difficulty of Paying Living Expenses: Not hard at all  Food Insecurity: No Food Insecurity (08/15/2022)   Hunger Vital Sign    Worried About Running Out of Food in the Last Year: Never true    Ran Out of Food in the Last Year: Never true  Transportation Needs: No Transportation Needs (08/15/2022)   PRAPARE - Administrator, Civil Service (Medical): No    Lack of Transportation (Non-Medical): No  Physical Activity: Sufficiently Active (08/15/2022)   Exercise Vital Sign    Days of Exercise per Week: 7 days    Minutes of Exercise per Session: 40 min  Stress: No Stress Concern Present (08/15/2022)   [employer] of Occupational Health - Occupational Stress Questionnaire    Feeling of Stress : Not at all  Social Connections: Moderately Integrated (08/15/2022)   Social Connection and Isolation Panel [NHANES]    Frequency of Communication with Friends and Family: More than three times a week    Frequency of Social Gatherings with Friends and Family: More than three times a week    Attends Religious Services: 1 to 4 times per year    Active Member of [employer] or Organizations: No    Attends Engineer, structural: Never    Marital Status: Married    Allergies  Allergen Reactions   Bactrim [Sulfamethoxazole-Trimethoprim] Rash   Doxycycline Calcium Itching    "felt like pins and needles were in face after one  dose."   Latex Other (See Comments)    Took skin off when removed tape   Allopurinol Itching and Rash   Amoxicillin Rash    Has patient had a PCN reaction causing immediate rash, facial/tongue/throat swelling, SOB or lightheadedness with hypotension: No Has patient had a PCN reaction causing severe rash involving mucus membranes or skin necrosis: No Has patient had a PCN reaction that required hospitalization: No Has patient had a PCN reaction occurring within the last 10 years: No If all of the above answers are "NO", then may proceed with Cephalosporin use.      Current Outpatient Medications:  acyclovir (ZOVIRAX) 400 MG tablet, Take 1 tablet by mouth twice daily, Disp: 60 tablet, Rfl: 0   amLODipine (NORVASC) 5 MG tablet, Take 1 tablet (5 mg total) by mouth daily., Disp: 90 tablet, Rfl: 1   cetirizine (ZYRTEC) 10 MG tablet, Take 10 mg by mouth daily., Disp: , Rfl:    fluconazole (DIFLUCAN) 200 MG tablet, Take 1 tablet (200 mg total) by mouth daily., Disp: 30 tablet, Rfl: 11   fluticasone (CUTIVATE) 0.05 % cream, Apply bid prn to rash, Disp: 30 g, Rfl: 0   fluticasone (FLONASE) 50 MCG/ACT nasal spray, Place 2 sprays into both nostrils daily., Disp: 16 g, Rfl: 6   ondansetron (ZOFRAN) 8 MG tablet, Take 1 tablet (8 mg total) by mouth every 8 (eight) hours as needed for nausea., Disp: 20 tablet, Rfl: 14   Prenatal Vit-Fe Fumarate-FA (PRENATAL MULTIVITAMIN) TABS tablet, Take 1 tablet by mouth daily at 12 noon., Disp: , Rfl:    rosuvastatin (CRESTOR) 5 MG tablet, Take 1 tablet (5 mg total) by mouth at bedtime., Disp: 90 tablet, Rfl: 1   venetoclax (VENCLEXTA) 100 MG tablet, TAKE 1 TABLET (100 MG) BY MOUTH DAILY., Disp: 28 tablet, Rfl: 6   Review of Systems     Objective:   Physical Exam      Assessment & Plan:     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  I did remind him we are practicing outside of the guidelines  but have no big issue continue fluconazole as long as it is not doing harm and there is a potential that it may be preventing recurrence of his infection.  CLL remains on venetoclax: Is going to be following with Dr. Myna Hidalgo.   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

## 2023-03-02 ENCOUNTER — Ambulatory Visit: Payer: Medicare Other | Admitting: Infectious Disease

## 2023-03-02 ENCOUNTER — Other Ambulatory Visit: Payer: Self-pay

## 2023-03-02 ENCOUNTER — Other Ambulatory Visit: Payer: Self-pay | Admitting: Hematology & Oncology

## 2023-03-02 ENCOUNTER — Encounter: Payer: Self-pay | Admitting: Infectious Disease

## 2023-03-02 VITALS — BP 153/104 | HR 71 | Temp 98.2°F | Resp 16 | Wt 242.0 lb

## 2023-03-02 DIAGNOSIS — C9110 Chronic lymphocytic leukemia of B-cell type not having achieved remission: Secondary | ICD-10-CM

## 2023-03-02 DIAGNOSIS — D801 Nonfamilial hypogammaglobulinemia: Secondary | ICD-10-CM | POA: Diagnosis not present

## 2023-03-02 DIAGNOSIS — T85730A Infection and inflammatory reaction due to ventricular intracranial (communicating) shunt, initial encounter: Secondary | ICD-10-CM

## 2023-03-02 DIAGNOSIS — B451 Cerebral cryptococcosis: Secondary | ICD-10-CM

## 2023-03-02 MED ORDER — FLUCONAZOLE 200 MG PO TABS: 200.0000 mg | ORAL_TABLET | Freq: Every day | ORAL | 11 refills | Status: DC

## 2023-03-03 ENCOUNTER — Encounter: Payer: Self-pay | Admitting: Hematology

## 2023-03-12 DIAGNOSIS — E785 Hyperlipidemia, unspecified: Secondary | ICD-10-CM | POA: Diagnosis not present

## 2023-03-13 LAB — LIPID PANEL
Chol/HDL Ratio: 4.2 {ratio} (ref 0.0–5.0)
Cholesterol, Total: 135 mg/dL (ref 100–199)
HDL: 32 mg/dL — ABNORMAL LOW (ref >39)
LDL Chol Calc (NIH): 76 mg/dL (ref 0–99)
Triglycerides: 156 mg/dL — ABNORMAL HIGH (ref 0–149)
VLDL Cholesterol Cal: 27 mg/dL (ref 5–40)

## 2023-03-23 ENCOUNTER — Encounter: Payer: Self-pay | Admitting: Hematology & Oncology

## 2023-03-23 ENCOUNTER — Other Ambulatory Visit (HOSPITAL_COMMUNITY): Payer: Self-pay

## 2023-03-23 ENCOUNTER — Other Ambulatory Visit: Payer: Self-pay

## 2023-03-23 ENCOUNTER — Inpatient Hospital Stay: Payer: Medicare Other | Attending: Family

## 2023-03-23 ENCOUNTER — Inpatient Hospital Stay: Payer: Medicare Other | Admitting: Hematology & Oncology

## 2023-03-23 VITALS — BP 136/91 | HR 76 | Temp 98.6°F | Resp 16 | Ht 70.0 in | Wt 242.0 lb

## 2023-03-23 DIAGNOSIS — C9110 Chronic lymphocytic leukemia of B-cell type not having achieved remission: Secondary | ICD-10-CM

## 2023-03-23 DIAGNOSIS — B451 Cerebral cryptococcosis: Secondary | ICD-10-CM | POA: Insufficient documentation

## 2023-03-23 DIAGNOSIS — D801 Nonfamilial hypogammaglobulinemia: Secondary | ICD-10-CM

## 2023-03-23 LAB — CMP (CANCER CENTER ONLY)
ALT: 44 U/L (ref 0–44)
AST: 27 U/L (ref 15–41)
Albumin: 4.8 g/dL (ref 3.5–5.0)
Alkaline Phosphatase: 55 U/L (ref 38–126)
Anion gap: 6 (ref 5–15)
BUN: 11 mg/dL (ref 6–20)
CO2: 30 mmol/L (ref 22–32)
Calcium: 9.4 mg/dL (ref 8.9–10.3)
Chloride: 107 mmol/L (ref 98–111)
Creatinine: 1.08 mg/dL (ref 0.61–1.24)
GFR, Estimated: >60 mL/min (ref >60)
Glucose, Bld: 123 mg/dL — ABNORMAL HIGH (ref 70–99)
Potassium: 3.8 mmol/L (ref 3.5–5.1)
Sodium: 143 mmol/L (ref 135–145)
Total Bilirubin: 0.5 mg/dL (ref <1.2)
Total Protein: 6.4 g/dL — ABNORMAL LOW (ref 6.5–8.1)

## 2023-03-23 LAB — CBC WITH DIFFERENTIAL (CANCER CENTER ONLY)
Abs Immature Granulocytes: 0.03 10*3/uL (ref 0.00–0.07)
Basophils Absolute: 0.1 10*3/uL (ref 0.0–0.1)
Basophils Relative: 1 %
Eosinophils Absolute: 0.1 10*3/uL (ref 0.0–0.5)
Eosinophils Relative: 2 %
HCT: 44.8 % (ref 39.0–52.0)
Hemoglobin: 15.4 g/dL (ref 13.0–17.0)
Immature Granulocytes: 0 %
Lymphocytes Relative: 41 %
Lymphs Abs: 2.9 10*3/uL (ref 0.7–4.0)
MCH: 28.4 pg (ref 26.0–34.0)
MCHC: 34.4 g/dL (ref 30.0–36.0)
MCV: 82.7 fL (ref 80.0–100.0)
Monocytes Absolute: 0.4 10*3/uL (ref 0.1–1.0)
Monocytes Relative: 6 %
Neutro Abs: 3.5 10*3/uL (ref 1.7–7.7)
Neutrophils Relative %: 50 %
Platelet Count: 151 10*3/uL (ref 150–400)
RBC: 5.42 MIL/uL (ref 4.22–5.81)
RDW: 12.3 % (ref 11.5–15.5)
Smear Review: NORMAL
WBC Count: 7.1 10*3/uL (ref 4.0–10.5)
nRBC: 0 % (ref 0.0–0.2)

## 2023-03-23 LAB — LACTATE DEHYDROGENASE: LDH: 166 U/L (ref 98–192)

## 2023-03-23 NOTE — Progress Notes (Signed)
Specialty Pharmacy Refill Coordination Note  Roley Ode is a 52 y.o. male contacted today regarding refills of specialty medication(s) Venetoclax   Patient requested Delivery   Delivery date: 03/31/23   Verified address: [address]  [place] [id number]   Medication will be filled on 03/30/23.

## 2023-03-23 NOTE — Progress Notes (Signed)
Hematology and Oncology Follow Up Visit  David Whitehead [id number] 11/16/1970 52 y.o. 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  It has been quite a while since I last saw him.  He was in our office back in July.Marland Kitchen  He is doing pretty well.  He really has had no specific complaints.  He has had no fever.  He has had no nausea or vomiting.  He is eating well.  No change in bowel or bladder habits.  He has had no rashes.  He has had no bleeding.  There is been no leg swelling.  He continues on the venetoclax.  He is on 100 mg a day.  He is doing okay with the venetoclax.  He also is on Diflucan.  He is wondering if he could get vaccines.  I see no problems with him getting the flu vaccine.  I see no problems with him getting the pneumonia vaccine.  Also, he does want to know about the shingles vaccine.  Again I do not see a problem with him doing this.  At the present time, I would say that his performance status is probably ECOG 1.  We last saw him back in July.  Since then, has been doing pretty well.  He really has had no complaints.  Has been working.  Work has been quite busy for him.  Overall, his performance status is ECOG 0.    Medications:  Allergies as of 03/23/2023       Reactions   Bactrim [sulfamethoxazole-trimethoprim] Rash   Doxycycline Calcium Itching   "felt like pins and needles were in face after one dose."   Latex Other (See Comments)   Took skin off when removed tape   Allopurinol Itching, Rash   Amoxicillin Rash   Has patient had a PCN reaction causing immediate rash, facial/tongue/throat swelling, SOB or lightheadedness with hypotension: No Has patient had a PCN reaction causing severe rash involving mucus membranes or skin  necrosis: No Has patient had a PCN reaction that required hospitalization: No Has patient had a PCN reaction occurring within the last 10 years: No If all of the above answers are "NO", then may proceed with Cephalosporin use.        Medication List        Accurate as of March 23, 2023  1:31 PM. If you have any questions, ask your nurse or doctor.          acyclovir 400 MG tablet Commonly known as: ZOVIRAX Take 1 tablet by mouth twice daily   amLODipine 5 MG tablet Commonly known as: NORVASC Take 1 tablet (5 mg total) by mouth daily.   cetirizine 10 MG tablet Commonly known as: ZYRTEC Take 10 mg by mouth daily.   fluconazole 200 MG tablet Commonly known as: DIFLUCAN Take 1 tablet (200 mg total) by mouth daily.   fluticasone 0.05 % cream Commonly known as: CUTIVATE Apply bid prn to rash   fluticasone 50 MCG/ACT nasal spray Commonly known as: FLONASE Place 2 sprays into both nostrils daily.   ondansetron 8 MG tablet Commonly known as: Zofran Take 1 tablet (8 mg total) by mouth every 8 (eight) hours as needed for nausea.  prenatal multivitamin Tabs tablet Take 1 tablet by mouth daily at 12 noon.   rosuvastatin 5 MG tablet Commonly known as: Crestor Take 1 tablet (5 mg total) by mouth at bedtime.   Venclexta 100 MG tablet Generic drug: venetoclax TAKE 1 TABLET (100 MG) BY MOUTH DAILY.        Allergies:  Allergies  Allergen Reactions   Bactrim [Sulfamethoxazole-Trimethoprim] Rash   Doxycycline Calcium Itching    "felt like pins and needles were in face after one dose."   Latex Other (See Comments)    Took skin off when removed tape   Allopurinol Itching and Rash   Amoxicillin Rash    Has patient had a PCN reaction causing immediate rash, facial/tongue/throat swelling, SOB or lightheadedness with hypotension: No Has patient had a PCN reaction causing severe rash involving mucus membranes or skin necrosis: No Has patient had a PCN reaction that  required hospitalization: No Has patient had a PCN reaction occurring within the last 10 years: No If all of the above answers are "NO", then may proceed with Cephalosporin use.     Past Medical History, Surgical history, Social history, and Family History were reviewed and updated.  Review of Systems: Review of Systems  Constitutional: Negative.   HENT: Negative.    Eyes: Negative.   Respiratory: Negative.    Cardiovascular: Negative.   Gastrointestinal: Negative.   Genitourinary: Negative.   Musculoskeletal: Negative.  Negative for myalgias.  Skin: Negative.   Neurological: Negative.   Endo/Heme/Allergies: Negative.   Psychiatric/Behavioral: Negative.       Physical Exam:  height is 5\' 10"  (1.778 m) and weight is 242 lb (109.8 kg). His oral temperature is 98.6 F (37 C). His blood pressure is 136/91 (abnormal) and his pulse is 76. His respiration is 16 and oxygen saturation is 100%.   Wt Readings from Last 3 Encounters:  03/23/23 242 lb (109.8 kg)  03/02/23 242 lb (109.8 kg)  02/16/23 241 lb 9.6 oz (109.6 kg)    Physical Exam Vitals reviewed.  HENT:     Head: Normocephalic and atraumatic.  Eyes:     Pupils: Pupils are equal, round, and reactive to light.  Cardiovascular:     Rate and Rhythm: Normal rate and regular rhythm.     Heart sounds: Normal heart sounds.  Pulmonary:     Effort: Pulmonary effort is normal.     Breath sounds: Normal breath sounds.  Abdominal:     General: Bowel sounds are normal.     Palpations: Abdomen is soft.  Musculoskeletal:        General: No tenderness or deformity. Normal range of motion.     Cervical back: Normal range of motion.  Lymphadenopathy:     Cervical: No cervical adenopathy.  Skin:    General: Skin is warm and dry.     Findings: No erythema or rash.  Neurological:     Mental Status: He is alert and oriented to person, place, and time.  Psychiatric:        Behavior: Behavior normal.        Thought Content: Thought  content normal.        Judgment: Judgment normal.    Lab Results  Component Value Date   WBC 7.1 03/23/2023   HGB 15.4 03/23/2023   HCT 44.8 03/23/2023   MCV 82.7 03/23/2023   PLT 151 03/23/2023   Lab Results  Component Value Date   FERRITIN 42 11/02/2019   IRON 48 11/02/2019  TIBC 329 11/02/2019   UIBC 280 11/02/2019   IRONPCTSAT 15 (L) 11/02/2019   Lab Results  Component Value Date   RBC 5.42 03/23/2023   Lab Results  Component Value Date   KPAFRELGTCHN 13.0 11/24/2022   LAMBDASER 4.6 (L) 11/24/2022   KAPLAMBRATIO 2.83 (H) 11/24/2022   Lab Results  Component Value Date   IGGSERUM 520 (L) 11/24/2022   IGA 9 (L) 11/24/2022   IGMSERUM <5 (L) 11/24/2022   Lab Results  Component Value Date   TOTALPROTELP 6.1 11/24/2022   ALBUMINELP 4.1 11/24/2022   A1GS 0.2 11/24/2022   A2GS 0.5 11/24/2022   BETS 0.9 11/24/2022   GAMS 0.4 11/24/2022   MSPIKE Not Observed 11/24/2022   SPEI Comment 11/24/2022     Chemistry      Component Value Date/Time   NA 143 03/23/2023 1156   NA 139 06/05/2021 0950   K 3.8 03/23/2023 1156   CL 107 03/23/2023 1156   CO2 30 03/23/2023 1156   BUN 11 03/23/2023 1156   BUN 18 06/05/2021 0950   CREATININE 1.08 03/23/2023 1156   CREATININE 1.07 02/26/2021 1130      Component Value Date/Time   CALCIUM 9.4 03/23/2023 1156   ALKPHOS 55 03/23/2023 1156   AST 27 03/23/2023 1156   ALT 44 03/23/2023 1156   BILITOT 0.5 03/23/2023 1156       Impression and Plan: Mr. Deluke is a very pleasant 52 yo gentleman with an aggressive CLL by virtue of the cytogenetics.  He developed cryptococcal meningitis.  He was hospitalized for this.  Thankfully, he is doing quite nicely.  I will continue him on the venetoclax for right now.  I really do not think that we need to stop the venetoclax given the adverse cytogenetics of his CLL.  I have noted that the lymphocyte percentage is going up slowly.  It is still within the normal range.  Again we will  have to watch this closely.  If we see that it continues to rise we may have to consider a bone marrow biopsy on him.  I would like to see him back in 4 months.  We will get him through the Holiday season and through Winter.   Josph Macho, MD 11/11/20241:31 PM

## 2023-03-23 NOTE — Progress Notes (Signed)
Specialty Pharmacy Ongoing Clinical Assessment Note  David Whitehead is a 52 y.o. male who is being followed by the specialty pharmacy service for RxSp Oncology   Patient's specialty medication(s) reviewed today: Venetoclax   Missed doses in the last 4 weeks: 0   Patient/Caregiver did not have any additional questions or concerns.   Therapeutic benefit summary: Patient is achieving benefit   Adverse events/side effects summary: No adverse events/side effects   Patient's therapy is appropriate to: Continue    Goals Addressed             This Visit's Progress    Achieve or maintain remission       Patient is on track. Patient will maintain adherence.  David Whitehead remains clinically stable at this time.          Follow up:  6 months  Servando Snare Specialty Pharmacist

## 2023-03-24 LAB — IGG, IGA, IGM
IgA: 8 mg/dL — ABNORMAL LOW (ref 90–386)
IgG (Immunoglobin G), Serum: 530 mg/dL — ABNORMAL LOW (ref 603–1613)
IgM (Immunoglobulin M), Srm: 10 mg/dL — ABNORMAL LOW (ref 20–172)

## 2023-03-24 LAB — KAPPA/LAMBDA LIGHT CHAINS
Kappa free light chain: 18.8 mg/L (ref 3.3–19.4)
Kappa, lambda light chain ratio: 4.7 — ABNORMAL HIGH (ref 0.26–1.65)
Lambda free light chains: 4 mg/L — ABNORMAL LOW (ref 5.7–26.3)

## 2023-03-25 LAB — PROTEIN ELECTROPHORESIS, SERUM
A/G Ratio: 1.8 — ABNORMAL HIGH (ref 0.7–1.7)
Albumin ELP: 3.9 g/dL (ref 2.9–4.4)
Alpha-1-Globulin: 0.2 g/dL (ref 0.0–0.4)
Alpha-2-Globulin: 0.6 g/dL (ref 0.4–1.0)
Beta Globulin: 0.9 g/dL (ref 0.7–1.3)
Gamma Globulin: 0.5 g/dL (ref 0.4–1.8)
Globulin, Total: 2.2 g/dL (ref 2.2–3.9)
Total Protein ELP: 6.1 g/dL (ref 6.0–8.5)

## 2023-04-01 ENCOUNTER — Other Ambulatory Visit: Payer: Self-pay | Admitting: Hematology & Oncology

## 2023-04-01 DIAGNOSIS — C9110 Chronic lymphocytic leukemia of B-cell type not having achieved remission: Secondary | ICD-10-CM

## 2023-04-02 ENCOUNTER — Encounter: Payer: Self-pay | Admitting: Hematology

## 2023-04-03 ENCOUNTER — Ambulatory Visit (INDEPENDENT_AMBULATORY_CARE_PROVIDER_SITE_OTHER): Payer: Medicare Other

## 2023-04-03 VITALS — Ht 70.0 in | Wt 242.0 lb

## 2023-04-03 DIAGNOSIS — Z0000 Encounter for general adult medical examination without abnormal findings: Secondary | ICD-10-CM

## 2023-04-03 NOTE — Progress Notes (Signed)
Subjective:   David Whitehead is a 52 y.o. male who presents for Medicare Annual/Subsequent preventive examination.  Visit Complete: Virtual I connected with  David Whitehead on 04/03/23 by a audio enabled telemedicine application and verified that I am speaking with the correct person using two identifiers.  Patient Location: Home  Provider Location: Home Office  I discussed the limitations of evaluation and management by telemedicine. The patient expressed understanding and agreed to proceed.  Vital Signs: Because this visit was a virtual/telehealth visit, some criteria may be missing or patient reported. Any vitals not documented were not able to be obtained and vitals that have been documented are patient reported.  Cardiac Risk Factors include: advanced age (>6men, >3 women);male gender;hypertension;dyslipidemia     Objective:    Today's Vitals   04/03/23 1807  Weight: 242 lb (109.8 kg)  Height: 5\' 10"  (1.778 m)   Body mass index is 34.72 kg/m.     04/03/2023    6:11 PM 03/23/2023   12:47 PM 11/24/2022    1:00 PM 07/28/2022    1:57 PM 03/24/2022   10:19 AM 03/10/2022    2:34 PM 11/21/2021   11:26 AM  Advanced Directives  Does Patient Have a Medical Advance Directive? No Yes Yes Yes No No No  Type of Advance Directive  Living will Living will Living will     Does patient want to make changes to medical advance directive?  No - Patient declined       Would patient like information on creating a medical advance directive? Yes (MAU/Ambulatory/Procedural Areas - Information given)  No - Patient declined No - Patient declined No - Patient declined No - Patient declined     Current Medications (verified) Outpatient Encounter Medications as of 04/03/2023  Medication Sig   acyclovir (ZOVIRAX) 400 MG tablet Take 1 tablet by mouth twice daily   amLODipine (NORVASC) 5 MG tablet Take 1 tablet (5 mg total) by mouth daily.   cetirizine (ZYRTEC) 10 MG tablet Take 10 mg by  mouth daily.   fluconazole (DIFLUCAN) 200 MG tablet Take 1 tablet (200 mg total) by mouth daily.   fluticasone (CUTIVATE) 0.05 % cream Apply bid prn to rash   fluticasone (FLONASE) 50 MCG/ACT nasal spray Place 2 sprays into both nostrils daily.   ondansetron (ZOFRAN) 8 MG tablet Take 1 tablet (8 mg total) by mouth every 8 (eight) hours as needed for nausea.   Prenatal Vit-Fe Fumarate-FA (PRENATAL MULTIVITAMIN) TABS tablet Take 1 tablet by mouth daily at 12 noon.   rosuvastatin (CRESTOR) 5 MG tablet Take 1 tablet (5 mg total) by mouth at bedtime.   venetoclax (VENCLEXTA) 100 MG tablet TAKE 1 TABLET (100 MG) BY MOUTH DAILY.   No facility-administered encounter medications on file as of 04/03/2023.    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 Removed May 2023   COLONOSCOPY N/A 04/01/2019   Procedure: COLONOSCOPY;  Surgeon: West Bali, MD;  Location: [facility] ENDO SUITE;  Service: Endoscopy;  Laterality: N/A;  12:45pm   ESOPHAGOGASTRODUODENOSCOPY N/A 04/01/2019   Procedure: ESOPHAGOGASTRODUODENOSCOPY (EGD);  Surgeon: West Bali, MD;  Location: [facility]ITE;  Service: Endoscopy;  Laterality: N/A;    EYE SURGERY  February 2023   Laser vision correction on my left eye only   FINGER SURGERY     SHUNT REMOVAL N/A 09/13/2021   Procedure: SHUNT REMOVAL;  Surgeon: Coletta Memos, MD;  Location: [facility] OR;  Service: Neurosurgery;  Laterality: N/A;  RM 19   VENTRICULOPERITONEAL SHUNT Right 02/18/2019   Procedure: SHUNT INSERTION VENTRICULAR-PERITONEAL;  Surgeon: Coletta Memos, MD;  Location: [facility] OR;  Service: Neurosurgery;  Laterality: Right;  SHUNT INSERTION VENTRICULAR-PERITONEAL   Family History  Problem Relation Age of Onset   Colon cancer Father 63       passed from CRC   Cancer Father    Colon cancer Paternal Uncle 78       passed from CRC   Social History   Socioeconomic History   Marital status: Married    Spouse name: Delice Bison   Number of children: 0   Years of education: 13   Highest education level: Associate degree: occupational, Scientist, product/process development, or vocational program  Occupational History   Not on file  Tobacco Use   Smoking status: Never   Smokeless tobacco: Never  Vaping Use   Vaping status: Never Used  Substance and Sexual Activity   Alcohol use: Not Currently   Drug use: Never   Sexual activity: Yes  Other Topics Concern   Not on file  Social History Narrative   Right handed    Lives with wife, Delice Bison   Caffeine use: rare   Social Determinants of Health   Financial Resource Strain: Low Risk  (03/30/2023)   Overall Financial Resource Strain (CARDIA)    Difficulty of Paying Living Expenses: Not hard at all  Food Insecurity: No Food Insecurity (03/30/2023)   Hunger Vital Sign    Worried About Running Out of Food in the Last Year: Never true    Ran Out of Food in the Last Year: Never true  Transportation Needs: No Transportation Needs (03/30/2023)   PRAPARE - Administrator, Civil Service (Medical): No    Lack of Transportation (Non-Medical): No  Physical Activity: Sufficiently Active (03/30/2023)   Exercise Vital Sign    Days of Exercise per Week: 7 days     Minutes of Exercise per Session: 60 min  Stress: No Stress Concern Present (03/30/2023)   [employer] of Occupational Health - Occupational Stress Questionnaire    Feeling of Stress : Not at all  Social Connections: Socially Integrated (03/30/2023)   Social Connection and Isolation Panel [NHANES]    Frequency of Communication with Friends and Family: More than three times a week    Frequency of Social Gatherings with Friends and Family: More than three times a week    Attends Religious Services: 1 to 4 times per year    Active Member of [employer] or Organizations: Yes    Attends Engineer, structural: More than 4 times per year    Marital Status: Married    Tobacco Counseling Counseling  given: Not Answered   Clinical Intake:  Pre-visit preparation completed: Yes  Pain : No/denies pain     Diabetes: No  How often do you need to have someone help you when you read instructions, pamphlets, or other written materials from your doctor or pharmacy?: 1 - Never  Interpreter Needed?: No  Information entered by :: Kandis Fantasia LPN   Activities of Daily Living    03/30/2023    8:44 AM  In your present state of health, do you have any difficulty performing the following activities:  Hearing? 0  Vision? 0  Difficulty concentrating or making decisions? 0  Walking or climbing stairs? 0  Dressing or bathing? 0  Doing errands, shopping? 0  Preparing Food and eating ? N  Using the Toilet? N  In the past six months, have you accidently leaked urine? N  Do you have problems with loss of bowel control? N  Managing your Medications? N  Managing your Finances? N  Housekeeping or managing your Housekeeping? N    Patient Care Team: Babs Sciara, MD as PCP - General (Family Medicine) Gwendel Hanson, RN as Oncology Nurse Navigator Fields, Darleene Cleaver, MD (Inactive) as Consulting Physician (Gastroenterology) Josph Macho, MD as Medical Oncologist (Oncology) [facility] [place], Lisette Grinder, MD as Consulting Physician (Infectious Diseases)  Indicate any recent Medical Services you may have received from other than [facility] providers in the past year (date may be approximate).     Assessment:   This is a routine wellness examination for Arka.  Hearing/Vision screen Hearing Screening - Comments:: Denies hearing difficulties   Vision Screening - Comments:: Wears rx glasses - up to date with routine eye exams with [facility].     Goals Addressed   None   Depression Screen    04/03/2023    6:10 PM 03/02/2023   10:26 AM 02/16/2023   10:48 AM 08/18/2022   10:22 AM 03/10/2022    2:33 PM 10/02/2021    3:29 PM 08/27/2021   10:57 AM  PHQ 2/9 Scores  PHQ - 2 Score 0 0 0 0 0 0 0  PHQ- 9 Score 0  0 1 0      Fall Risk    04/03/2023    6:12 PM 03/30/2023    8:44 AM 03/02/2023   10:26 AM 02/16/2023   10:48 AM 08/18/2022   10:22 AM  Fall Risk   Falls in the past year? 0 0 0 0 0  Number falls in past yr: 0  0 0 0  Injury with Fall? 0  0 0 0  Risk for fall due to : No Fall Risks  No Fall Risks    Follow up Falls prevention discussed;Education provided;Falls evaluation completed  Falls evaluation completed      MEDICARE RISK AT HOME: Medicare Risk at Home Any stairs in or around the home?: Yes If so, are there any without handrails?: Yes Home free of loose throw rugs in walkways, pet beds, electrical cords, etc?: No Adequate lighting in your home to reduce risk of falls?: Yes Life alert?: No Use of a cane, walker or w/c?: No Grab bars in the bathroom?: No Shower chair or bench in shower?: Yes Elevated toilet seat or a handicapped toilet?: No  TIMED UP AND GO:  Was the test performed?  No    Cognitive Function:        04/03/2023    6:12 PM 03/10/2022  2:38 PM  6CIT Screen  What Year? 0 points 0 points  What month? 0 points 0 points  What time? 0 points 0 points  Count back from 20 0 points 0 points  Months  in reverse 0 points 0 points  Repeat phrase 0 points 0 points  Total Score 0 points 0 points    Immunizations Immunization History  Administered Date(s) Administered   Influenza, Seasonal, Injecte, Preservative Fre 02/16/2023   Influenza,inj,Quad PF,6+ Mos 04/05/2018, 01/28/2019, 02/23/2020, 02/26/2021, 02/10/2022   Moderna Sars-Covid-2 Vaccination 07/06/2019, 08/03/2019    TDAP status: Due, Education has been provided regarding the importance of this vaccine. Advised may receive this vaccine at local pharmacy or Health Dept. Aware to provide a copy of the vaccination record if obtained from local pharmacy or Health Dept. Verbalized acceptance and understanding.  Flu Vaccine status: Up to date  Pneumococcal vaccine status: Up to date  Covid-19 vaccine status: Information provided on how to obtain vaccines.   Qualifies for Shingles Vaccine? Yes   Zostavax completed No   Shingrix Completed?: No.    Education has been provided regarding the importance of this vaccine. Patient has been advised to call insurance company to determine out of pocket expense if they have not yet received this vaccine. Advised may also receive vaccine at local pharmacy or Health Dept. Verbalized acceptance and understanding.  Screening Tests Health Maintenance  Topic Date Due   DTaP/Tdap/Td (1 - Tdap) Never done   Zoster Vaccines- Shingrix (1 of 2) Never done   COVID-19 Vaccine (3 - Moderna risk series) 08/31/2019   Colonoscopy  03/31/2024   Medicare Annual Wellness (AWV)  04/02/2024   INFLUENZA VACCINE  Completed   Hepatitis C Screening  Completed   HIV Screening  Completed   HPV VACCINES  Aged Out    Health Maintenance  Health Maintenance Due  Topic Date Due   DTaP/Tdap/Td (1 - Tdap) Never done   Zoster Vaccines- Shingrix (1 of 2) Never done   COVID-19 Vaccine (3 - Moderna risk series) 08/31/2019    Colorectal cancer screening: Type of screening: Colonoscopy. Completed 04/01/19. Repeat every 5  years  Lung Cancer Screening: (Low Dose CT Chest recommended if Age 62-80 years, 20 pack-year currently smoking OR have quit w/in 15years.) does not qualify.   Lung Cancer Screening Referral: n/a  Additional Screening:  Hepatitis C Screening: does qualify; Completed 04/05/18  Vision Screening: Recommended annual ophthalmology exams for early detection of glaucoma and other disorders of the eye. Is the patient up to date with their annual eye exam?  Yes  Who is the provider or what is the name of the office in which the patient attends annual eye exams? [facility].  If pt is not established with a provider, would they like to be referred to a provider to establish care? No .   Dental Screening: Recommended annual dental exams for proper oral hygiene  Community Resource Referral / Chronic Care Management: CRR required this visit?  No   CCM required this visit?  No     Plan:     I have personally reviewed and noted the following in the patient's chart:   Medical and social history Use of alcohol, tobacco or illicit drugs  Current medications and supplements including opioid prescriptions. Patient is not currently taking opioid prescriptions. Functional ability and status Nutritional status Physical activity Advanced directives List of other physicians Hospitalizations, surgeries, and ER visits in previous 12 months Vitals Screenings to include cognitive, depression, and falls  Referrals and appointments  In addition, I have reviewed and discussed with patient certain preventive protocols, quality metrics, and best practice recommendations. A written personalized care plan for preventive services as well as general preventive health recommendations were provided to patient.     Kandis Fantasia [place], [place]   [id number]   After Visit Summary: (MyChart) Due to this being a telephonic visit, the after visit summary with patients personalized plan was offered to patient via  MyChart   Nurse Notes: No concerns at this time

## 2023-04-03 NOTE — Patient Instructions (Signed)
David Whitehead , Thank you for taking time to come for your Medicare Wellness Visit. I appreciate your ongoing commitment to your health goals. Please review the following plan we discussed and let me know if I can assist you in the future.   Referrals/Orders/Follow-Ups/Clinician Recommendations: Aim for 30 minutes of exercise or brisk walking, 6-8 glasses of water, and 5 servings of fruits and vegetables each day.  This is a list of the screening recommended for you and due dates:  Health Maintenance  Topic Date Due   DTaP/Tdap/Td vaccine (1 - Tdap) Never done   Zoster (Shingles) Vaccine (1 of 2) Never done   COVID-19 Vaccine (3 - Moderna risk series) 08/31/2019   Colon Cancer Screening  03/31/2024   Medicare Annual Wellness Visit  04/02/2024   Flu Shot  Completed   Hepatitis C Screening  Completed   HIV Screening  Completed   HPV Vaccine  Aged Out    Advanced directives: (ACP Link)Information on Advanced Care Planning can be found at [facility] Advance Health Care Directives Advance Health Care Directives ([email or web address]   Next Medicare Annual Wellness Visit scheduled for next year: Yes

## 2023-04-16 ENCOUNTER — Other Ambulatory Visit: Payer: Self-pay

## 2023-04-16 NOTE — Progress Notes (Signed)
Specialty Pharmacy Refill Coordination Note  David Whitehead is a 52 y.o. male contacted today regarding refills of specialty medication(s) Venetoclax   Patient requested Delivery   Delivery date: 04/27/23   Verified address: [address]  [place] [address]   Medication will be filled on 04/24/23.

## 2023-05-02 ENCOUNTER — Other Ambulatory Visit: Payer: Self-pay | Admitting: Hematology & Oncology

## 2023-05-02 DIAGNOSIS — C9110 Chronic lymphocytic leukemia of B-cell type not having achieved remission: Secondary | ICD-10-CM

## 2023-05-04 ENCOUNTER — Encounter: Payer: Self-pay | Admitting: Hematology

## 2023-05-20 ENCOUNTER — Other Ambulatory Visit: Payer: Self-pay

## 2023-05-20 NOTE — Progress Notes (Signed)
 Specialty Pharmacy Refill Coordination Note  David Whitehead is a 53 y.o. male contacted today regarding refills of specialty medication(s) Venetoclax  (VENCLEXTA )   Patient requested Delivery   Delivery date: 05/22/23   Verified address: [address]   [place]  [id number]   Medication will be filled on 05/21/23.

## 2023-05-21 ENCOUNTER — Other Ambulatory Visit: Payer: Self-pay

## 2023-06-03 ENCOUNTER — Other Ambulatory Visit: Payer: Self-pay | Admitting: Hematology & Oncology

## 2023-06-03 DIAGNOSIS — C9110 Chronic lymphocytic leukemia of B-cell type not having achieved remission: Secondary | ICD-10-CM

## 2023-06-04 ENCOUNTER — Encounter: Payer: Self-pay | Admitting: Hematology

## 2023-06-09 ENCOUNTER — Other Ambulatory Visit (HOSPITAL_COMMUNITY): Payer: Self-pay | Admitting: Pharmacy Technician

## 2023-06-09 ENCOUNTER — Other Ambulatory Visit (HOSPITAL_COMMUNITY): Payer: Self-pay

## 2023-06-09 NOTE — Progress Notes (Signed)
Specialty Pharmacy Refill Coordination Note  David Whitehead is a 53 y.o. male contacted today regarding refills of specialty medication(s) Venetoclax [name])   Patient requested Delivery   Delivery date: 06/17/23   Verified address: [address] [place]   Medication will be filled on 06/16/23.

## 2023-06-16 ENCOUNTER — Other Ambulatory Visit: Payer: Self-pay

## 2023-06-29 DIAGNOSIS — K080 Exfoliation of teeth due to systemic causes: Secondary | ICD-10-CM | POA: Diagnosis not present

## 2023-07-07 ENCOUNTER — Other Ambulatory Visit: Payer: Self-pay | Admitting: Hematology & Oncology

## 2023-07-07 ENCOUNTER — Other Ambulatory Visit: Payer: Self-pay

## 2023-07-07 DIAGNOSIS — C9110 Chronic lymphocytic leukemia of B-cell type not having achieved remission: Secondary | ICD-10-CM

## 2023-07-10 ENCOUNTER — Other Ambulatory Visit: Payer: Self-pay

## 2023-07-10 NOTE — Progress Notes (Signed)
 Specialty Pharmacy Refill Coordination Note  David Whitehead is a 53 y.o. male contacted today regarding refills of specialty medication(s) Venetoclax [facility])   Patient requested (Patient-Rptd) Delivery   Delivery date: (Patient-Rptd) 07/15/23   Verified address: (Patient-Rptd) [address] [place], [place] [address]   Medication will be filled on 03.04.25.

## 2023-07-27 ENCOUNTER — Other Ambulatory Visit: Payer: Medicare Other

## 2023-07-27 ENCOUNTER — Ambulatory Visit: Payer: Medicare Other | Admitting: Hematology & Oncology

## 2023-08-03 ENCOUNTER — Other Ambulatory Visit: Payer: Self-pay

## 2023-08-03 ENCOUNTER — Inpatient Hospital Stay: Payer: Medicare Other | Attending: Hematology & Oncology

## 2023-08-03 ENCOUNTER — Inpatient Hospital Stay (HOSPITAL_BASED_OUTPATIENT_CLINIC_OR_DEPARTMENT_OTHER): Payer: Medicare Other | Admitting: Hematology & Oncology

## 2023-08-03 ENCOUNTER — Encounter: Payer: Self-pay | Admitting: Hematology & Oncology

## 2023-08-03 VITALS — BP 120/97 | HR 71 | Temp 98.3°F | Resp 18 | Ht 70.0 in | Wt 244.0 lb

## 2023-08-03 DIAGNOSIS — B451 Cerebral cryptococcosis: Secondary | ICD-10-CM | POA: Diagnosis not present

## 2023-08-03 DIAGNOSIS — C9110 Chronic lymphocytic leukemia of B-cell type not having achieved remission: Secondary | ICD-10-CM | POA: Insufficient documentation

## 2023-08-03 LAB — CMP (CANCER CENTER ONLY)
ALT: 45 U/L — ABNORMAL HIGH (ref 0–44)
AST: 27 U/L (ref 15–41)
Albumin: 4.8 g/dL (ref 3.5–5.0)
Alkaline Phosphatase: 49 U/L (ref 38–126)
Anion gap: 8 (ref 5–15)
BUN: 11 mg/dL (ref 6–20)
CO2: 27 mmol/L (ref 22–32)
Calcium: 8.9 mg/dL (ref 8.9–10.3)
Chloride: 108 mmol/L (ref 98–111)
Creatinine: 1.01 mg/dL (ref 0.61–1.24)
GFR, Estimated: >60 mL/min (ref >60)
Glucose, Bld: 91 mg/dL (ref 70–99)
Potassium: 4.1 mmol/L (ref 3.5–5.1)
Sodium: 143 mmol/L (ref 135–145)
Total Bilirubin: 0.4 mg/dL (ref 0.0–1.2)
Total Protein: 6.2 g/dL — ABNORMAL LOW (ref 6.5–8.1)

## 2023-08-03 LAB — CBC WITH DIFFERENTIAL (CANCER CENTER ONLY)
Abs Immature Granulocytes: 0.04 10*3/uL (ref 0.00–0.07)
Basophils Absolute: 0.1 10*3/uL (ref 0.0–0.1)
Basophils Relative: 1 %
Eosinophils Absolute: 0.2 10*3/uL (ref 0.0–0.5)
Eosinophils Relative: 2 %
HCT: 45.3 % (ref 39.0–52.0)
Hemoglobin: 15.5 g/dL (ref 13.0–17.0)
Immature Granulocytes: 0 %
Lymphocytes Relative: 45 %
Lymphs Abs: 4.1 10*3/uL — ABNORMAL HIGH (ref 0.7–4.0)
MCH: 28.6 pg (ref 26.0–34.0)
MCHC: 34.2 g/dL (ref 30.0–36.0)
MCV: 83.6 fL (ref 80.0–100.0)
Monocytes Absolute: 1.7 10*3/uL — ABNORMAL HIGH (ref 0.1–1.0)
Monocytes Relative: 19 %
Neutro Abs: 3 10*3/uL (ref 1.7–7.7)
Neutrophils Relative %: 33 %
Platelet Count: 152 10*3/uL (ref 150–400)
RBC: 5.42 MIL/uL (ref 4.22–5.81)
RDW: 12.2 % (ref 11.5–15.5)
Smear Review: NORMAL
WBC Count: 9 10*3/uL (ref 4.0–10.5)
nRBC: 0 % (ref 0.0–0.2)

## 2023-08-03 LAB — SAVE SMEAR(SSMR), FOR PROVIDER SLIDE REVIEW

## 2023-08-03 LAB — LACTATE DEHYDROGENASE: LDH: 197 U/L — ABNORMAL HIGH (ref 98–192)

## 2023-08-03 NOTE — Progress Notes (Signed)
 Hematology and Oncology Follow Up Visit  David Whitehead [id number] Mar 16, 1971 53 y.o. 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  We last saw him back in November.  Since then, he has had no problems.  He is busy working around the house.  He is going [place] a fence and then build a "mini" dog park in the backyard for their dogs.  He has had no fever.  He has had no problems with COVID or Influenza.  So far, he is doing well on the venetoclax.  He is on low-dose venetoclax.  He has had no rashes.  There is been no bleeding.  He has had no change in bowel or bladder habits.  He has had no mouth sores.  He has had no headache.  Overall, I would say that his performance status is probably ECOG 1.    Medications:  Allergies as of 08/03/2023       Reactions   Bactrim [sulfamethoxazole-trimethoprim] Rash   Doxycycline Calcium Itching   "felt like pins and needles were in face after one dose."   Latex Other (See Comments)   Took skin off when removed tape   Allopurinol Itching, Rash   Amoxicillin Rash   Has patient had a PCN reaction causing immediate rash, facial/tongue/throat swelling, SOB or lightheadedness with hypotension: No Has patient had a PCN reaction causing severe rash involving mucus membranes or skin necrosis: No Has patient had a PCN reaction that required hospitalization: No Has patient had a PCN reaction occurring within the last 10 years: No If all of the above answers are "NO", then may proceed with Cephalosporin use.        Medication List        Accurate as of August 03, 2023 12:56 PM. If you have any questions, ask your nurse or doctor.          acyclovir 400 MG tablet Commonly known as: ZOVIRAX Take 1 tablet by  mouth twice daily   amLODipine 5 MG tablet Commonly known as: NORVASC Take 1 tablet (5 mg total) by mouth daily.   cetirizine 10 MG tablet Commonly known as: ZYRTEC Take 10 mg by mouth daily.   fluconazole 200 MG tablet Commonly known as: DIFLUCAN Take 1 tablet (200 mg total) by mouth daily.   fluticasone 0.05 % cream Commonly known as: CUTIVATE Apply bid prn to rash   fluticasone 50 MCG/ACT nasal spray Commonly known as: FLONASE Place 2 sprays into both nostrils daily.   ondansetron 8 MG tablet Commonly known as: Zofran Take 1 tablet (8 mg total) by mouth every 8 (eight) hours as needed for nausea.   prenatal multivitamin Tabs tablet Take 1 tablet by mouth daily at 12 noon.   rosuvastatin 5 MG tablet Commonly known as: Crestor Take 1 tablet (5 mg total) by mouth at bedtime.   Venclexta 100 MG tablet Generic drug: venetoclax TAKE 1 TABLET (100 MG) BY MOUTH DAILY.        Allergies:  Allergies  Allergen Reactions   Bactrim [Sulfamethoxazole-Trimethoprim] Rash   Doxycycline Calcium Itching    "felt like pins and needles were in face after one dose."   Latex Other (See Comments)    Took  skin off when removed tape   Allopurinol Itching and Rash   Amoxicillin Rash    Has patient had a PCN reaction causing immediate rash, facial/tongue/throat swelling, SOB or lightheadedness with hypotension: No Has patient had a PCN reaction causing severe rash involving mucus membranes or skin necrosis: No Has patient had a PCN reaction that required hospitalization: No Has patient had a PCN reaction occurring within the last 10 years: No If all of the above answers are "NO", then may proceed with Cephalosporin use.     Past Medical History, Surgical history, Social history, and Family History were reviewed and updated.  Review of Systems: Review of Systems  Constitutional: Negative.   HENT: Negative.    Eyes: Negative.   Respiratory: Negative.    Cardiovascular:  Negative.   Gastrointestinal: Negative.   Genitourinary: Negative.   Musculoskeletal: Negative.  Negative for myalgias.  Skin: Negative.   Neurological: Negative.   Endo/Heme/Allergies: Negative.   Psychiatric/Behavioral: Negative.       Physical Exam:  height is 5\' 10"  (1.778 m) and weight is 244 lb (110.7 kg). His oral temperature is 98.3 F (36.8 C). His blood pressure is 120/97 (abnormal) and his pulse is 71. His respiration is 18 and oxygen saturation is 100%.   Wt Readings from Last 3 Encounters:  08/03/23 244 lb (110.7 kg)  04/03/23 242 lb (109.8 kg)  03/23/23 242 lb (109.8 kg)    Physical Exam Vitals reviewed.  HENT:     Head: Normocephalic and atraumatic.  Eyes:     Pupils: Pupils are equal, round, and reactive to light.  Cardiovascular:     Rate and Rhythm: Normal rate and regular rhythm.     Heart sounds: Normal heart sounds.  Pulmonary:     Effort: Pulmonary effort is normal.     Breath sounds: Normal breath sounds.  Abdominal:     General: Bowel sounds are normal.     Palpations: Abdomen is soft.  Musculoskeletal:        General: No tenderness or deformity. Normal range of motion.     Cervical back: Normal range of motion.  Lymphadenopathy:     Cervical: No cervical adenopathy.  Skin:    General: Skin is warm and dry.     Findings: No erythema or rash.  Neurological:     Mental Status: He is alert and oriented to person, place, and time.  Psychiatric:        Behavior: Behavior normal.        Thought Content: Thought content normal.        Judgment: Judgment normal.     Lab Results  Component Value Date   WBC 9.0 08/03/2023   HGB 15.5 08/03/2023   HCT 45.3 08/03/2023   MCV 83.6 08/03/2023   PLT 152 08/03/2023   Lab Results  Component Value Date   FERRITIN 42 11/02/2019   IRON 48 11/02/2019   TIBC 329 11/02/2019   UIBC 280 11/02/2019   IRONPCTSAT 15 (L) 11/02/2019   Lab Results  Component Value Date   RBC 5.42 08/03/2023   Lab  Results  Component Value Date   KPAFRELGTCHN 18.8 03/23/2023   LAMBDASER 4.0 (L) 03/23/2023   KAPLAMBRATIO 4.70 (H) 03/23/2023   Lab Results  Component Value Date   IGGSERUM 530 (L) 03/23/2023   IGA 8 (L) 03/23/2023   IGMSERUM 10 (L) 03/23/2023   Lab Results  Component Value Date   TOTALPROTELP 6.1 03/23/2023   ALBUMINELP 3.9 03/23/2023  A1GS 0.2 03/23/2023   A2GS 0.6 03/23/2023   BETS 0.9 03/23/2023   GAMS 0.5 03/23/2023   MSPIKE Not Observed 03/23/2023   SPEI Comment 03/23/2023     Chemistry      Component Value Date/Time   NA 143 08/03/2023 1143   NA 139 06/05/2021 0950   K 4.1 08/03/2023 1143   CL 108 08/03/2023 1143   CO2 27 08/03/2023 1143   BUN 11 08/03/2023 1143   BUN 18 06/05/2021 0950   CREATININE 1.01 08/03/2023 1143   CREATININE 1.07 02/26/2021 1130      Component Value Date/Time   CALCIUM 8.9 08/03/2023 1143   ALKPHOS 49 08/03/2023 1143   AST 27 08/03/2023 1143   ALT 45 (H) 08/03/2023 1143   BILITOT 0.4 08/03/2023 1143       Impression and Plan: David Whitehead is a very pleasant 53 yo gentleman with an aggressive CLL by virtue of the cytogenetics.  He developed cryptococcal meningitis.  He was hospitalized for this.  Thankfully, he is doing quite nicely.  I will continue him on the venetoclax for right now.  I really do not think that we need to stop the venetoclax given the adverse cytogenetics of his CLL.  I have noted that the lymphocyte percentage is going up slowly.  It is still within the normal range.  Again we will have to watch this closely.  If we see that it continues to rise we may have to consider a bone marrow biopsy on him.  I would like to see him back in 6 months.  We will get him through the Spring and most of Summer.     Josph Macho, MD 3/24/202512:56 PM

## 2023-08-04 ENCOUNTER — Other Ambulatory Visit: Payer: Self-pay | Admitting: Hematology & Oncology

## 2023-08-04 DIAGNOSIS — C9110 Chronic lymphocytic leukemia of B-cell type not having achieved remission: Secondary | ICD-10-CM

## 2023-08-04 LAB — PROTEIN ELECTROPHORESIS, SERUM, WITH REFLEX
A/G Ratio: 2 — ABNORMAL HIGH (ref 0.7–1.7)
Albumin ELP: 4.3 g/dL (ref 2.9–4.4)
Alpha-1-Globulin: 0.2 g/dL (ref 0.0–0.4)
Alpha-2-Globulin: 0.5 g/dL (ref 0.4–1.0)
Beta Globulin: 0.9 g/dL (ref 0.7–1.3)
Gamma Globulin: 0.5 g/dL (ref 0.4–1.8)
Globulin, Total: 2.1 g/dL — ABNORMAL LOW (ref 2.2–3.9)
Total Protein ELP: 6.4 g/dL (ref 6.0–8.5)

## 2023-08-04 LAB — KAPPA/LAMBDA LIGHT CHAINS
Kappa free light chain: 29.3 mg/L — ABNORMAL HIGH (ref 3.3–19.4)
Kappa, lambda light chain ratio: 7.92 — ABNORMAL HIGH (ref 0.26–1.65)
Lambda free light chains: 3.7 mg/L — ABNORMAL LOW (ref 5.7–26.3)

## 2023-08-05 ENCOUNTER — Encounter: Payer: Self-pay | Admitting: Hematology

## 2023-08-06 ENCOUNTER — Other Ambulatory Visit: Payer: Self-pay

## 2023-08-06 ENCOUNTER — Other Ambulatory Visit: Payer: Self-pay | Admitting: Pharmacy Technician

## 2023-08-06 LAB — IGG, IGA, IGM
IgA: 8 mg/dL — ABNORMAL LOW (ref 90–386)
IgG (Immunoglobin G), Serum: 496 mg/dL — ABNORMAL LOW (ref 603–1613)
IgM (Immunoglobulin M), Srm: <5 mg/dL — ABNORMAL LOW (ref 20–172)

## 2023-08-06 NOTE — Progress Notes (Signed)
 Specialty Pharmacy Refill Coordination Note  Loman Logan is a 53 y.o. male contacted today regarding refills of specialty medication(s) Venetoclax [facility])   Patient requested (Patient-Rptd) Delivery   Delivery date: (Patient-Rptd) 08/13/23   Verified address: (Patient-Rptd) [address] [place], [place] [address]   Medication will be filled on 08/12/23.

## 2023-08-12 ENCOUNTER — Other Ambulatory Visit: Payer: Self-pay

## 2023-08-17 ENCOUNTER — Ambulatory Visit: Payer: Medicare Other | Admitting: Family Medicine

## 2023-08-17 ENCOUNTER — Encounter: Payer: Self-pay | Admitting: Family Medicine

## 2023-08-17 ENCOUNTER — Other Ambulatory Visit: Payer: Self-pay

## 2023-08-17 VITALS — BP 137/90 | HR 76 | Temp 98.4°F | Ht 70.0 in | Wt 245.0 lb

## 2023-08-17 DIAGNOSIS — I10 Essential (primary) hypertension: Secondary | ICD-10-CM | POA: Diagnosis not present

## 2023-08-17 DIAGNOSIS — Z79899 Other long term (current) drug therapy: Secondary | ICD-10-CM

## 2023-08-17 DIAGNOSIS — E785 Hyperlipidemia, unspecified: Secondary | ICD-10-CM

## 2023-08-17 NOTE — Progress Notes (Signed)
   Subjective:    Patient ID: David Whitehead, male    DOB: December 24, 1970, 53 y.o.   MRN: [id number]  HPI  Follow for HTN patient states he has taken medication.  Relates at times he does have some leg edema.  Is under the care of specialist for CLL.  Has not had any setbacks recently.  Overall energy level okay.  He they are following his leukocytes closely May or may not need a bone marrow in the future He does state he tries to fit in some walking for exercise He walks his dogs frequently Stress levels are reasonable Compliance with medicine good  Review of Systems     Objective:   Physical Exam  General-in no acute distress Eyes-no discharge Lungs-respiratory rate normal, CTA CV-no murmurs,RRR Extremities skin warm dry no edema Neuro grossly normal Behavior normal, alert       Assessment & Plan:  HTN Recheck with mercury cuff large cuff 132/94 Goal is to be 130/80 if possible We did discuss HCTZ and potassium adding this to his regiment versus reviewed efforts with dietary and walking.  Patient would like to try 2 additional months of working hard at this then we will follow-up and recheck at that time continue amlodipine for now

## 2023-09-03 ENCOUNTER — Other Ambulatory Visit (HOSPITAL_COMMUNITY): Payer: Self-pay

## 2023-09-06 ENCOUNTER — Other Ambulatory Visit: Payer: Self-pay | Admitting: Hematology & Oncology

## 2023-09-06 DIAGNOSIS — C9110 Chronic lymphocytic leukemia of B-cell type not having achieved remission: Secondary | ICD-10-CM

## 2023-09-07 ENCOUNTER — Other Ambulatory Visit (HOSPITAL_COMMUNITY): Payer: Self-pay

## 2023-09-07 ENCOUNTER — Encounter: Payer: Self-pay | Admitting: Hematology

## 2023-09-07 ENCOUNTER — Other Ambulatory Visit: Payer: Self-pay

## 2023-09-07 ENCOUNTER — Other Ambulatory Visit: Payer: Self-pay | Admitting: Hematology & Oncology

## 2023-09-07 MED ORDER — VENETOCLAX 100 MG PO TABS
ORAL_TABLET | ORAL | 6 refills | Status: DC
  Filled 2023-09-07: qty 28, 28d supply, fill #0
  Filled 2023-09-29: qty 28, 28d supply, fill #1
  Filled 2023-10-30: qty 28, 28d supply, fill #2
  Filled 2023-11-25 – 2023-12-03 (×2): qty 28, 28d supply, fill #3
  Filled 2023-12-21 – 2023-12-29 (×2): qty 28, 28d supply, fill #4
  Filled 2024-01-25: qty 28, 28d supply, fill #5
  Filled 2024-02-26: qty 28, 28d supply, fill #6

## 2023-09-07 NOTE — Progress Notes (Signed)
 Specialty Pharmacy Ongoing Clinical Assessment Note  David Whitehead is a 53 y.o. male who is being followed by the specialty pharmacy service for RxSp Oncology   Patient's specialty medication(s) reviewed today: Venetoclax  (VENCLEXTA )   Missed doses in the last 4 weeks: 0   Patient/Caregiver did not have any additional questions or concerns.   Therapeutic benefit summary: Patient is achieving benefit   Adverse events/side effects summary: No adverse events/side effects   Patient's therapy is appropriate to: Continue    Goals Addressed             This Visit's Progress    Achieve or maintain remission   On track    Patient is on track. Patient will maintain adherence.  Mr. [name] remains clinically stable at this time.          Follow up:  6 months  Malachi Screws Specialty Pharmacist

## 2023-09-07 NOTE — Progress Notes (Signed)
 Specialty Pharmacy Refill Coordination Note  David Whitehead is a 53 y.o. male contacted today regarding refills of specialty medication(s) Venetoclax  (VENCLEXTA )   Patient requested Delivery   Delivery date: 09/16/23   Verified address: [address] [place], [place] [address]   Medication will be filled on 09/15/23. This fill date is pending response to refill request from provider. Patient is aware and if they have not received fill by intended date they must follow up with pharmacy.

## 2023-09-24 ENCOUNTER — Other Ambulatory Visit: Payer: Self-pay

## 2023-09-28 ENCOUNTER — Other Ambulatory Visit: Payer: Self-pay

## 2023-09-29 ENCOUNTER — Other Ambulatory Visit: Payer: Self-pay | Admitting: Pharmacy Technician

## 2023-09-29 ENCOUNTER — Other Ambulatory Visit: Payer: Self-pay

## 2023-09-29 NOTE — Progress Notes (Signed)
 Specialty Pharmacy Refill Coordination Note  David Whitehead is a 52 y.o. male contacted today regarding refills of specialty medication(s) Venetoclax  (VENCLEXTA )   Patient requested (Patient-Rptd) Delivery   Delivery date: (Patient-Rptd) 10/08/23   Verified address: (Patient-Rptd) [address] [place], [place] [address]   Medication will be filled on 10/07/23.

## 2023-10-05 ENCOUNTER — Other Ambulatory Visit: Payer: Self-pay | Admitting: Hematology & Oncology

## 2023-10-05 DIAGNOSIS — C9110 Chronic lymphocytic leukemia of B-cell type not having achieved remission: Secondary | ICD-10-CM

## 2023-10-06 ENCOUNTER — Encounter: Payer: Self-pay | Admitting: Hematology

## 2023-10-28 DIAGNOSIS — E785 Hyperlipidemia, unspecified: Secondary | ICD-10-CM | POA: Diagnosis not present

## 2023-10-28 DIAGNOSIS — Z79899 Other long term (current) drug therapy: Secondary | ICD-10-CM | POA: Diagnosis not present

## 2023-10-29 ENCOUNTER — Ambulatory Visit: Payer: Self-pay | Admitting: Family Medicine

## 2023-10-29 LAB — LIPID PANEL
Chol/HDL Ratio: 5.2 ratio — ABNORMAL HIGH (ref 0.0–5.0)
Cholesterol, Total: 150 mg/dL (ref 100–199)
HDL: 29 mg/dL — ABNORMAL LOW (ref >39)
LDL Chol Calc (NIH): 78 mg/dL (ref 0–99)
Triglycerides: 258 mg/dL — ABNORMAL HIGH (ref 0–149)
VLDL Cholesterol Cal: 43 mg/dL — ABNORMAL HIGH (ref 5–40)

## 2023-10-29 LAB — HEPATIC FUNCTION PANEL
ALT: 56 IU/L — ABNORMAL HIGH (ref 0–44)
AST: 38 IU/L (ref 0–40)
Albumin: 4.6 g/dL (ref 4.1–5.1)
Alkaline Phosphatase: 67 IU/L (ref 44–121)
Bilirubin Total: 0.5 mg/dL (ref 0.0–1.2)
Bilirubin, Direct: 0.17 mg/dL (ref 0.00–0.40)
Total Protein: 6.1 g/dL (ref 6.0–8.5)

## 2023-10-30 ENCOUNTER — Other Ambulatory Visit: Payer: Self-pay

## 2023-10-30 ENCOUNTER — Encounter (INDEPENDENT_AMBULATORY_CARE_PROVIDER_SITE_OTHER): Payer: Self-pay

## 2023-10-30 NOTE — Progress Notes (Signed)
 Specialty Pharmacy Refill Coordination Note  David Whitehead is a 53 y.o. male contacted today regarding refills of specialty medication(s) Venetoclax  (VENCLEXTA )   Patient requested Delivery   Delivery date: 11/05/23   Verified address: [address], [place], [place] [address]   Medication will be filled on 11/04/23.

## 2023-11-05 ENCOUNTER — Other Ambulatory Visit: Payer: Self-pay | Admitting: Hematology & Oncology

## 2023-11-05 DIAGNOSIS — C9110 Chronic lymphocytic leukemia of B-cell type not having achieved remission: Secondary | ICD-10-CM

## 2023-11-06 ENCOUNTER — Ambulatory Visit: Admitting: Family Medicine

## 2023-11-06 VITALS — BP 134/88 | HR 74 | Ht 70.0 in | Wt 249.0 lb

## 2023-11-06 DIAGNOSIS — E785 Hyperlipidemia, unspecified: Secondary | ICD-10-CM | POA: Diagnosis not present

## 2023-11-06 DIAGNOSIS — I10 Essential (primary) hypertension: Secondary | ICD-10-CM

## 2023-11-06 DIAGNOSIS — K760 Fatty (change of) liver, not elsewhere classified: Secondary | ICD-10-CM | POA: Insufficient documentation

## 2023-11-06 DIAGNOSIS — R7401 Elevation of levels of liver transaminase levels: Secondary | ICD-10-CM | POA: Diagnosis not present

## 2023-11-06 MED ORDER — AMLODIPINE BESYLATE 5 MG PO TABS: 5.0000 mg | ORAL_TABLET | Freq: Every day | ORAL | 1 refills | Status: DC

## 2023-11-06 MED ORDER — ROSUVASTATIN CALCIUM 5 MG PO TABS: 5.0000 mg | ORAL_TABLET | Freq: Every day | ORAL | 1 refills | Status: DC

## 2023-11-06 NOTE — Progress Notes (Signed)
   Subjective:    Patient ID: David Whitehead, male    DOB: 08-06-1970, 53 y.o.   MRN: [id number]  HPI Follow up HTN  Patient doing a good job taking his medicine Fitting and walking in the morning Trying to do a good job of dietary measures Weight staying steady Denies chest tightness pressure pain shortness of breath No fever chills or sweats No symptoms of his cancer getting worse Does see specialist on a regular basis Compliant with his medicine  Review of Systems     Objective:   Physical Exam  General-in no acute distress Eyes-no discharge Lungs-respiratory rate normal, CTA CV-no murmurs,RRR Extremities skin warm dry no edema Neuro grossly normal Behavior normal, alert  Results for orders placed or performed in visit on 08/17/23  Lipid Panel   Collection Time: 10/28/23 10:24 AM  Result Value Ref Range   Cholesterol, Total 150 100 - 199 mg/dL   Triglycerides 741 (H) 0 - 149 mg/dL   HDL 29 (L) >60 mg/dL   VLDL Cholesterol Cal 43 (H) 5 - 40 mg/dL   LDL Chol Calc (NIH) 78 0 - 99 mg/dL   Chol/HDL Ratio 5.2 (H) 0.0 - 5.0 ratio  Hepatic Function Panel   Collection Time: 10/28/23 10:24 AM  Result Value Ref Range   Total Protein 6.1 6.0 - 8.5 g/dL   Albumin 4.6 4.1 - 5.1 g/dL   Bilirubin Total 0.5 0.0 - 1.2 mg/dL   Bilirubin, Direct 9.82 0.00 - 0.40 mg/dL   Alkaline Phosphatase 67 44 - 121 IU/L   AST 38 0 - 40 IU/L   ALT 56 (H) 0 - 44 IU/L         Assessment & Plan:   Hyperlipidemia very good control continue current measures  Elevated ALT minimally elevated consistent with fatty liver-this was seen on previous CAT scan  Continue visits with oncology  Blood pressure good control continue current measures  Wellness in 6 months time with lab work before that visit

## 2023-11-23 ENCOUNTER — Other Ambulatory Visit: Payer: Self-pay

## 2023-11-25 ENCOUNTER — Other Ambulatory Visit: Payer: Self-pay

## 2023-12-03 ENCOUNTER — Encounter (INDEPENDENT_AMBULATORY_CARE_PROVIDER_SITE_OTHER): Payer: Self-pay

## 2023-12-03 ENCOUNTER — Other Ambulatory Visit: Payer: Self-pay

## 2023-12-03 NOTE — Progress Notes (Signed)
 Specialty Pharmacy Refill Coordination Note  Con Arganbright is a 53 y.o. male contacted today regarding refills of specialty medication(s) Venetoclax  (VENCLEXTA )   Patient requested (Patient-Rptd) Delivery   Delivery date: 12/04/23   Verified address: (Patient-Rptd) [address] [place], [place]. [address]   Medication will be filled on 07.24.25.

## 2023-12-05 ENCOUNTER — Other Ambulatory Visit: Payer: Self-pay | Admitting: Hematology & Oncology

## 2023-12-05 DIAGNOSIS — C9110 Chronic lymphocytic leukemia of B-cell type not having achieved remission: Secondary | ICD-10-CM

## 2023-12-07 ENCOUNTER — Encounter: Payer: Self-pay | Admitting: Hematology

## 2023-12-21 ENCOUNTER — Other Ambulatory Visit (HOSPITAL_COMMUNITY): Payer: Self-pay

## 2023-12-21 ENCOUNTER — Other Ambulatory Visit: Payer: Self-pay

## 2023-12-21 ENCOUNTER — Encounter (INDEPENDENT_AMBULATORY_CARE_PROVIDER_SITE_OTHER): Payer: Self-pay

## 2023-12-21 NOTE — Progress Notes (Signed)
 Specialty Pharmacy Refill Coordination Note  MyChart Questionnaire Submission  David Whitehead is a 53 y.o. male contacted today regarding refills of specialty medication(s) Venclexta .  Doses on hand: (Patient-Rptd) 14   Patient requested: (Patient-Rptd) Delivery   Delivery date: 12/30/23   Verified address: (Patient-Rptd) [address] [place], [place] [address]  Medication will be filled on 12/29/23. Patient is aware of delivery date.

## 2023-12-29 ENCOUNTER — Other Ambulatory Visit: Payer: Self-pay

## 2024-01-05 ENCOUNTER — Other Ambulatory Visit: Payer: Self-pay | Admitting: Hematology & Oncology

## 2024-01-05 DIAGNOSIS — C9110 Chronic lymphocytic leukemia of B-cell type not having achieved remission: Secondary | ICD-10-CM

## 2024-01-05 DIAGNOSIS — H524 Presbyopia: Secondary | ICD-10-CM | POA: Diagnosis not present

## 2024-01-20 ENCOUNTER — Other Ambulatory Visit (HOSPITAL_COMMUNITY): Payer: Self-pay

## 2024-01-23 ENCOUNTER — Encounter (INDEPENDENT_AMBULATORY_CARE_PROVIDER_SITE_OTHER): Payer: Self-pay

## 2024-01-25 ENCOUNTER — Other Ambulatory Visit: Payer: Self-pay

## 2024-01-25 ENCOUNTER — Other Ambulatory Visit: Payer: Self-pay | Admitting: Pharmacy Technician

## 2024-01-25 NOTE — Progress Notes (Signed)
 Specialty Pharmacy Refill Coordination Note  David Whitehead is a 53 y.o. male contacted today regarding refills of specialty medication(s) Venetoclax  (VENCLEXTA )   Patient requested Delivery   Delivery date: 02/04/24   Verified address: [address] [place], [place] [address]   Medication will be filled on 02/03/24.  Questionnaire answered.

## 2024-02-03 ENCOUNTER — Encounter: Payer: Self-pay | Admitting: Hematology & Oncology

## 2024-02-03 ENCOUNTER — Inpatient Hospital Stay: Attending: Hematology & Oncology

## 2024-02-03 ENCOUNTER — Inpatient Hospital Stay (HOSPITAL_BASED_OUTPATIENT_CLINIC_OR_DEPARTMENT_OTHER): Admitting: Hematology & Oncology

## 2024-02-03 ENCOUNTER — Other Ambulatory Visit: Payer: Self-pay

## 2024-02-03 VITALS — BP 135/93 | HR 74 | Temp 98.2°F | Resp 20 | Ht 70.0 in | Wt 247.0 lb

## 2024-02-03 DIAGNOSIS — C9110 Chronic lymphocytic leukemia of B-cell type not having achieved remission: Secondary | ICD-10-CM

## 2024-02-03 DIAGNOSIS — Z7969 Long term (current) use of other immunomodulators and immunosuppressants: Secondary | ICD-10-CM | POA: Diagnosis not present

## 2024-02-03 DIAGNOSIS — B451 Cerebral cryptococcosis: Secondary | ICD-10-CM | POA: Diagnosis not present

## 2024-02-03 LAB — CBC WITH DIFFERENTIAL (CANCER CENTER ONLY)
Abs Immature Granulocytes: 0.04 K/uL (ref 0.00–0.07)
Basophils Absolute: 0.1 K/uL (ref 0.0–0.1)
Basophils Relative: 1 %
Eosinophils Absolute: 0.1 K/uL (ref 0.0–0.5)
Eosinophils Relative: 1 %
HCT: 44.3 % (ref 39.0–52.0)
Hemoglobin: 14.9 g/dL (ref 13.0–17.0)
Immature Granulocytes: 0 %
Lymphocytes Relative: 74 %
Lymphs Abs: 17.6 K/uL — ABNORMAL HIGH (ref 0.7–4.0)
MCH: 28.4 pg (ref 26.0–34.0)
MCHC: 33.6 g/dL (ref 30.0–36.0)
MCV: 84.4 fL (ref 80.0–100.0)
Monocytes Absolute: 3.3 K/uL — ABNORMAL HIGH (ref 0.1–1.0)
Monocytes Relative: 14 %
Neutro Abs: 2.3 K/uL (ref 1.7–7.7)
Neutrophils Relative %: 10 %
Platelet Count: 136 K/uL — ABNORMAL LOW (ref 150–400)
RBC: 5.25 MIL/uL (ref 4.22–5.81)
RDW: 12.2 % (ref 11.5–15.5)
Smear Review: NORMAL
WBC Count: 23.4 K/uL — ABNORMAL HIGH (ref 4.0–10.5)
nRBC: 0.1 % (ref 0.0–0.2)

## 2024-02-03 LAB — CMP (CANCER CENTER ONLY)
ALT: 57 U/L — ABNORMAL HIGH (ref 0–44)
AST: 42 U/L — ABNORMAL HIGH (ref 15–41)
Albumin: 4.8 g/dL (ref 3.5–5.0)
Alkaline Phosphatase: 84 U/L (ref 38–126)
Anion gap: 10 (ref 5–15)
BUN: 14 mg/dL (ref 6–20)
CO2: 25 mmol/L (ref 22–32)
Calcium: 9.5 mg/dL (ref 8.9–10.3)
Chloride: 108 mmol/L (ref 98–111)
Creatinine: 1.04 mg/dL (ref 0.61–1.24)
GFR, Estimated: >60 mL/min (ref >60)
Glucose, Bld: 131 mg/dL — ABNORMAL HIGH (ref 70–99)
Potassium: 4.2 mmol/L (ref 3.5–5.1)
Sodium: 143 mmol/L (ref 135–145)
Total Bilirubin: 0.4 mg/dL (ref 0.0–1.2)
Total Protein: 6.5 g/dL (ref 6.5–8.1)

## 2024-02-03 LAB — LACTATE DEHYDROGENASE: LDH: 256 U/L — ABNORMAL HIGH (ref 98–192)

## 2024-02-03 LAB — SAVE SMEAR(SSMR), FOR PROVIDER SLIDE REVIEW

## 2024-02-03 NOTE — Progress Notes (Signed)
 BP remains elevated, 135/93, instructed to monitor at home and notify PCP if it remains over 140/90. Verbalized understanding.

## 2024-02-03 NOTE — Progress Notes (Signed)
 Hematology and Oncology Follow Up Visit  David Whitehead [id number] April 02, 1971 53 y.o. 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  We last saw him back in March.  Since then, he has been feeling okay.  He was had no specific complaints.  He has had no problems with fever.  He has had no issues with nausea or vomiting.  There is been no exposure to COVID.  He has had no change in bowel or bladder habits.  He has had no bleeding.  There has been no leg swelling.  He is building a dog park in the backyard.  I know this was important for he and the family.  He is currently on low-dose venetoclax .  He continues on Diflucan .  He has had no weight loss or weight gain.  He is still working.  Overall, I would have to say that his performance status is probably ECOG 0.    Medications:  Allergies as of 02/03/2024       Reactions   Bactrim  [sulfamethoxazole -trimethoprim ] Rash   Doxycycline  Calcium  Itching   felt like pins and needles were in face after one dose.   Latex Other (See Comments)   Took skin off when removed tape   Allopurinol  Itching, Rash   Amoxicillin Rash   Has patient had a PCN reaction causing immediate rash, facial/tongue/throat swelling, SOB or lightheadedness with hypotension: No Has patient had a PCN reaction causing severe rash involving mucus membranes or skin necrosis: No Has patient had a PCN reaction that required hospitalization: No Has patient had a PCN reaction occurring within the last 10 years: No If all of the above answers are NO, then may proceed with Cephalosporin use.        Medication List        Accurate as of February 03, 2024 12:25 PM. If you have any questions, ask your nurse or doctor.           acyclovir  400 MG tablet Commonly known as: ZOVIRAX  Take 1 tablet by mouth twice daily   amLODipine  5 MG tablet Commonly known as: NORVASC  Take 1 tablet (5 mg total) by mouth daily.   cetirizine 10 MG tablet Commonly known as: ZYRTEC Take 10 mg by mouth daily.   fluconazole  200 MG tablet Commonly known as: DIFLUCAN  Take 1 tablet (200 mg total) by mouth daily.   fluticasone  0.05 % cream Commonly known as: CUTIVATE  Apply bid prn to rash   fluticasone  50 MCG/ACT nasal spray Commonly known as: FLONASE  Place 2 sprays into both nostrils daily.   ondansetron  8 MG tablet Commonly known as: Zofran  Take 1 tablet (8 mg total) by mouth every 8 (eight) hours as needed for nausea.   prenatal multivitamin Tabs tablet Take 1 tablet by mouth daily at 12 noon.   rosuvastatin  5 MG tablet Commonly known as: Crestor  Take 1 tablet (5 mg total) by mouth at bedtime.   Venclexta  100 MG tablet Generic drug: venetoclax  TAKE 1 TABLET (100 MG) BY MOUTH DAILY.        Allergies:  Allergies  Allergen Reactions   Bactrim  [Sulfamethoxazole -Trimethoprim ] Rash   Doxycycline  Calcium  Itching    felt like pins and needles were in face after one  dose.   Latex Other (See Comments)    Took skin off when removed tape   Allopurinol  Itching and Rash   Amoxicillin Rash    Has patient had a PCN reaction causing immediate rash, facial/tongue/throat swelling, SOB or lightheadedness with hypotension: No Has patient had a PCN reaction causing severe rash involving mucus membranes or skin necrosis: No Has patient had a PCN reaction that required hospitalization: No Has patient had a PCN reaction occurring within the last 10 years: No If all of the above answers are NO, then may proceed with Cephalosporin use.     Past Medical History, Surgical history, Social history, and Family History were reviewed and updated.  Review of Systems: Review of Systems  Constitutional: Negative.   HENT: Negative.     Eyes: Negative.   Respiratory: Negative.    Cardiovascular: Negative.   Gastrointestinal: Negative.   Genitourinary: Negative.   Musculoskeletal: Negative.  Negative for myalgias.  Skin: Negative.   Neurological: Negative.   Endo/Heme/Allergies: Negative.   Psychiatric/Behavioral: Negative.       Physical Exam:  height is 5' 10 (1.778 m) and weight is 247 lb (112 kg). His oral temperature is 98.2 F (36.8 C). His blood pressure is 135/93 (abnormal) and his pulse is 74. His respiration is 20 and oxygen saturation is 100%.   Wt Readings from Last 3 Encounters:  02/03/24 247 lb (112 kg)  11/06/23 249 lb (112.9 kg)  08/17/23 245 lb (111.1 kg)    Physical Exam Vitals reviewed.  Constitutional:      Comments: I can palpate a lymph node in both axilla.  They are small.  They are mobile.  HENT:     Head: Normocephalic and atraumatic.  Eyes:     Pupils: Pupils are equal, round, and reactive to light.  Cardiovascular:     Rate and Rhythm: Normal rate and regular rhythm.     Heart sounds: Normal heart sounds.  Pulmonary:     Effort: Pulmonary effort is normal.     Breath sounds: Normal breath sounds.  Abdominal:     General: Bowel sounds are normal.     Palpations: Abdomen is soft.  Musculoskeletal:        General: No tenderness or deformity. Normal range of motion.     Cervical back: Normal range of motion.  Lymphadenopathy:     Cervical: No cervical adenopathy.  Skin:    General: Skin is warm and dry.     Findings: No erythema or rash.  Neurological:     Mental Status: He is alert and oriented to person, place, and time.  Psychiatric:        Behavior: Behavior normal.        Thought Content: Thought content normal.        Judgment: Judgment normal.     Lab Results  Component Value Date   WBC 23.4 (H) 02/03/2024   HGB 14.9 02/03/2024   HCT 44.3 02/03/2024   MCV 84.4 02/03/2024   PLT PENDING 02/03/2024   Lab Results  Component Value Date   FERRITIN 42  11/02/2019   IRON  48 11/02/2019   TIBC 329 11/02/2019   UIBC 280 11/02/2019   IRONPCTSAT 15 (L) 11/02/2019   Lab Results  Component Value Date   RBC 5.25 02/03/2024   Lab Results  Component Value Date   KPAFRELGTCHN 29.3 (H) 08/03/2023   LAMBDASER 3.7 (L) 08/03/2023   KAPLAMBRATIO 7.92 (H) 08/03/2023   Lab Results  Component Value Date  IGGSERUM 496 (L) 08/03/2023   IGA 8 (L) 08/03/2023   IGMSERUM <5 (L) 08/03/2023   Lab Results  Component Value Date   TOTALPROTELP 6.4 08/03/2023   ALBUMINELP 4.3 08/03/2023   A1GS 0.2 08/03/2023   A2GS 0.5 08/03/2023   BETS 0.9 08/03/2023   GAMS 0.5 08/03/2023   MSPIKE Not Observed 08/03/2023   SPEI Comment 03/23/2023     Chemistry      Component Value Date/Time   NA 143 08/03/2023 1143   NA 139 06/05/2021 0950   K 4.1 08/03/2023 1143   CL 108 08/03/2023 1143   CO2 27 08/03/2023 1143   BUN 11 08/03/2023 1143   BUN 18 06/05/2021 0950   CREATININE 1.01 08/03/2023 1143   CREATININE 1.07 02/26/2021 1130      Component Value Date/Time   CALCIUM  8.9 08/03/2023 1143   ALKPHOS 67 10/28/2023 1024   AST 38 10/28/2023 1024   AST 27 08/03/2023 1143   ALT 56 (H) 10/28/2023 1024   ALT 45 (H) 08/03/2023 1143   BILITOT 0.5 10/28/2023 1024   BILITOT 0.4 08/03/2023 1143     Elysia his peripheral blood smear.  He clearly has relapsed.  He has marked lymphocytosis.  He has smudge cells.  I do not see any nucleated red blood cells.  Platelets are adequate in number and size.  Impression and Plan: David Whitehead is a very pleasant 53 yo gentleman with an aggressive CLL by virtue of the cytogenetics.   Unfortunately, he has recurrence now.  The blood smear I think proves that.  His white cell count is also up quite a bit.  Most telling is the fact that his percentage of lymphocytes is clearly high.  Again I have to restage him.  He will have to have another bone marrow biopsy done.  I am unsure if he has had a bone marrow biopsy done.  We  really need to get this some and get the cytogenetics off the bone marrow.  He will need to have a CT scan of the body so we can see if he has adenopathy..  I told him to stop the venetoclax .  As far as treatment, we could certainly use Jaypirca.  I think this would be reasonable.  We could also consider utilizing Gazyva along with Brukinsa and venetoclax .  However, this might be myelosuppressive a little bit.  He is on treatment for the cryptococcal meningitis with the Diflucan .  I hate this.  Everything had been going so well for him.  He really has not had any problems with the CLL for other a few years.  We will have to get everything set up.  I will have to see him back probably in a month.  At that time, we will likely figure out how we can treat this relapse.     Maude JONELLE Crease, MD 9/24/202512:25 PM

## 2024-02-04 ENCOUNTER — Other Ambulatory Visit: Payer: Self-pay | Admitting: Hematology & Oncology

## 2024-02-04 DIAGNOSIS — C9110 Chronic lymphocytic leukemia of B-cell type not having achieved remission: Secondary | ICD-10-CM

## 2024-02-11 ENCOUNTER — Ambulatory Visit (HOSPITAL_BASED_OUTPATIENT_CLINIC_OR_DEPARTMENT_OTHER)
Admission: RE | Admit: 2024-02-11 | Discharge: 2024-02-11 | Disposition: A | Discharge status: Home / Self Care | Source: Ambulatory Visit | Attending: Hematology & Oncology | Admitting: Hematology & Oncology

## 2024-02-11 ENCOUNTER — Ambulatory Visit: Payer: Self-pay | Admitting: Hematology & Oncology

## 2024-02-11 DIAGNOSIS — K760 Fatty (change of) liver, not elsewhere classified: Secondary | ICD-10-CM | POA: Diagnosis not present

## 2024-02-11 DIAGNOSIS — C9110 Chronic lymphocytic leukemia of B-cell type not having achieved remission: Secondary | ICD-10-CM

## 2024-02-11 DIAGNOSIS — R161 Splenomegaly, not elsewhere classified: Secondary | ICD-10-CM | POA: Diagnosis not present

## 2024-02-11 DIAGNOSIS — R590 Localized enlarged lymph nodes: Secondary | ICD-10-CM | POA: Diagnosis not present

## 2024-02-11 MED ORDER — IOHEXOL 300 MG/ML  SOLN
125.0000 mL | Freq: Once | INTRAMUSCULAR | Status: AC | PRN
  Administered 2024-02-11: 125 mL via INTRAVENOUS

## 2024-02-12 ENCOUNTER — Encounter: Payer: Self-pay | Admitting: Hematology

## 2024-02-12 NOTE — Telephone Encounter (Signed)
 Advised via MyChart.

## 2024-02-12 NOTE — Telephone Encounter (Signed)
-----   Message from Maude JONELLE Crease sent at 02/11/2024  4:44 PM EDT ----- Please call and let him know that the CT scan does show that he has progressive adenopathy.  I really should not be all that surprised.  However, it is not as extensive as I would have thought.   Thanks.  Jeralyn ----- Message ----- From: Rebecka, Rad Results In Sent: 02/11/2024   2:15 PM EDT To: Maude JONELLE Crease, MD

## 2024-02-17 ENCOUNTER — Other Ambulatory Visit: Payer: Self-pay | Admitting: *Deleted

## 2024-02-17 DIAGNOSIS — K760 Fatty (change of) liver, not elsewhere classified: Secondary | ICD-10-CM

## 2024-02-17 DIAGNOSIS — E782 Mixed hyperlipidemia: Secondary | ICD-10-CM

## 2024-02-17 DIAGNOSIS — Z8042 Family history of malignant neoplasm of prostate: Secondary | ICD-10-CM

## 2024-02-17 DIAGNOSIS — I10 Essential (primary) hypertension: Secondary | ICD-10-CM

## 2024-02-17 DIAGNOSIS — Z79899 Other long term (current) drug therapy: Secondary | ICD-10-CM

## 2024-02-17 DIAGNOSIS — E785 Hyperlipidemia, unspecified: Secondary | ICD-10-CM

## 2024-02-21 NOTE — Progress Notes (Unsigned)
 Subjective:  Chief Complaint: follow-up for cryptococcal meningitis   Patient ID: David Whitehead, male    DOB: [birth year], 53 y.o.   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 Removed May 2023   COLONOSCOPY N/A 04/01/2019   Procedure: COLONOSCOPY;  Surgeon: Harvey Margo CROME, MD;  Location: [facility] ENDO SUITE;  Service: Endoscopy;  Laterality: N/A;  12:45pm   ESOPHAGOGASTRODUODENOSCOPY N/A 04/01/2019   Procedure: ESOPHAGOGASTRODUODENOSCOPY (EGD);  Surgeon: Harvey Margo CROME, MD;  Location: [facility] ENDO SUITE;  Service: Endoscopy;  Laterality: N/A;   EYE SURGERY  February 2023   Laser vision correction on my left eye only   FINGER SURGERY     SHUNT REMOVAL N/A 09/13/2021   Procedure: SHUNT REMOVAL;  Surgeon: Gillie Duncans, MD;  Location: [facility] OR;  Service: Neurosurgery;  Laterality: N/A;  RM 19   VENTRICULOPERITONEAL SHUNT Right 02/18/2019   Procedure: SHUNT INSERTION VENTRICULAR-PERITONEAL;  Surgeon: Gillie Duncans, MD;  Location: [facility] OR;  Service: Neurosurgery;  Laterality: Right;  SHUNT INSERTION VENTRICULAR-PERITONEAL    Family History  Problem Relation Age of Onset   Colon cancer Father 69       passed from CRC   Cancer Father    Colon cancer Paternal Uncle 2       passed from CRC      Social History    Socioeconomic History   Marital status: Married    Spouse name: Rexene   Number of children: 0   Years of education: 13   Highest education level: Associate degree: occupational, Scientist, product/process development, or vocational program  Occupational History   Not on file  Tobacco Use   Smoking status: Never   Smokeless tobacco: Never  Vaping Use   Vaping status: Never Used  Substance and Sexual Activity   Alcohol use: Not Currently   Drug use: Never   Sexual activity: Yes  Other Topics Concern   Not on file  Social History Narrative   Right handed    Lives with wife, Rexene   Caffeine use: rare   Social Drivers of Corporate investment banker Strain: Low Risk  (11/05/2023)   Overall Financial Resource Strain (CARDIA)    Difficulty of Paying Living Expenses: Not hard at all  Food Insecurity: No Food Insecurity (11/05/2023)   Hunger Vital Sign    Worried About Running Out of Food in the Last Year: Never true    Ran Out of Food in the Last Year: Never true  Transportation Needs: No Transportation Needs (11/05/2023)   PRAPARE - Administrator, Civil Service (Medical): No    Lack of Transportation (Non-Medical): No  Physical Activity: Sufficiently Active (11/05/2023)   Exercise Vital Sign  Days of Exercise per Week: 7 days    Minutes of Exercise per Session: 60 min  Stress: No Stress Concern Present (11/05/2023)   [employer] of Occupational Health - Occupational Stress Questionnaire    Feeling of Stress: Not at all  Social Connections: Moderately Integrated (11/05/2023)   Social Connection and Isolation Panel    Frequency of Communication with Friends and Family: More than three times a week    Frequency of Social Gatherings with Friends and Family: More than three times a week    Attends Religious Services: 1 to 4 times per year    Active Member of [employer] or Organizations: No    Attends Engineer, structural: Not on file    Marital Status: Married    Allergies  Allergen  Reactions   Bactrim  [Sulfamethoxazole -Trimethoprim ] Rash   Doxycycline  Calcium  Itching    felt like pins and needles were in face after one dose.   Latex Other (See Comments)    Took skin off when removed tape   Allopurinol  Itching and Rash   Amoxicillin Rash    Has patient had a PCN reaction causing immediate rash, facial/tongue/throat swelling, SOB or lightheadedness with hypotension: No Has patient had a PCN reaction causing severe rash involving mucus membranes or skin necrosis: No Has patient had a PCN reaction that required hospitalization: No Has patient had a PCN reaction occurring within the last 10 years: No If all of the above answers are NO, then may proceed with Cephalosporin use.      Current Outpatient Medications:    acyclovir  (ZOVIRAX ) 400 MG tablet, Take 1 tablet by mouth twice daily, Disp: 60 tablet, Rfl: 0   amLODipine  (NORVASC ) 5 MG tablet, Take 1 tablet (5 mg total) by mouth daily., Disp: 90 tablet, Rfl: 1   cetirizine (ZYRTEC) 10 MG tablet, Take 10 mg by mouth daily., Disp: , Rfl:    fluconazole  (DIFLUCAN ) 200 MG tablet, Take 1 tablet (200 mg total) by mouth daily., Disp: 30 tablet, Rfl: 11   fluticasone  (CUTIVATE ) 0.05 % cream, Apply bid prn to rash (Patient not taking: Reported on 02/03/2024), Disp: 30 g, Rfl: 0   fluticasone  (FLONASE ) 50 MCG/ACT nasal spray, Place 2 sprays into both nostrils daily., Disp: 16 g, Rfl: 6   ondansetron  (ZOFRAN ) 8 MG tablet, Take 1 tablet (8 mg total) by mouth every 8 (eight) hours as needed for nausea., Disp: 20 tablet, Rfl: 14   Prenatal Vit-Fe Fumarate-FA (PRENATAL MULTIVITAMIN) TABS tablet, Take 1 tablet by mouth daily at 12 noon., Disp: , Rfl:    rosuvastatin  (CRESTOR ) 5 MG tablet, Take 1 tablet (5 mg total) by mouth at bedtime., Disp: 90 tablet, Rfl: 1   venetoclax  (VENCLEXTA ) 100 MG tablet, TAKE 1 TABLET (100 MG) BY MOUTH DAILY., Disp: 28 tablet, Rfl: 6   Review of Systems     Objective:   Physical Exam         Assessment & Plan:

## 2024-02-22 ENCOUNTER — Ambulatory Visit (INDEPENDENT_AMBULATORY_CARE_PROVIDER_SITE_OTHER): Payer: Medicare Other | Admitting: Infectious Disease

## 2024-02-22 ENCOUNTER — Other Ambulatory Visit: Payer: Self-pay

## 2024-02-22 VITALS — BP 149/99 | HR 82 | Temp 98.2°F | Wt 251.0 lb

## 2024-02-22 DIAGNOSIS — Z23 Encounter for immunization: Secondary | ICD-10-CM

## 2024-02-22 DIAGNOSIS — B451 Cerebral cryptococcosis: Secondary | ICD-10-CM | POA: Diagnosis not present

## 2024-02-22 DIAGNOSIS — C9110 Chronic lymphocytic leukemia of B-cell type not having achieved remission: Secondary | ICD-10-CM | POA: Diagnosis not present

## 2024-02-22 DIAGNOSIS — D801 Nonfamilial hypogammaglobulinemia: Secondary | ICD-10-CM

## 2024-02-22 DIAGNOSIS — Z7185 Encounter for immunization safety counseling: Secondary | ICD-10-CM

## 2024-02-22 MED ORDER — FLUCONAZOLE 200 MG PO TABS: 200.0000 mg | ORAL_TABLET | Freq: Every day | ORAL | 11 refills | Status: AC

## 2024-02-25 ENCOUNTER — Encounter: Payer: Self-pay | Admitting: Hematology & Oncology

## 2024-02-26 ENCOUNTER — Other Ambulatory Visit: Payer: Self-pay

## 2024-02-26 ENCOUNTER — Other Ambulatory Visit (HOSPITAL_COMMUNITY): Payer: Self-pay

## 2024-02-26 NOTE — Progress Notes (Signed)
 Per 02/25/24 Patient Message Encounter:  As of right now, no you do not need to refill this medication. We will discuss next steps at your next appointment depending on your biopsy results.  Disenrolling patient from [facility].

## 2024-02-26 NOTE — Progress Notes (Signed)
 Disregard. Filled in error!

## 2024-02-26 NOTE — Progress Notes (Signed)
 Specialty Pharmacy Refill Coordination Note  Spoke with David Whitehead  David Whitehead is a 53 y.o. male contacted today regarding refills of specialty medication(s) Venetoclax  (VENCLEXTA )  Doses on hand: 8  Patient requested: Delivery   Delivery date: 03/01/24   Verified address: [address] [place] [address]  Medication will be filled on 02/29/24.

## 2024-02-29 ENCOUNTER — Encounter: Payer: Self-pay | Admitting: Family Medicine

## 2024-03-01 NOTE — Telephone Encounter (Signed)
 Cook, Jayce G, [place]      02/29/24  9:23 PM Needs visit.

## 2024-03-02 ENCOUNTER — Ambulatory Visit: Admitting: Family Medicine

## 2024-03-02 ENCOUNTER — Other Ambulatory Visit: Payer: Self-pay | Admitting: Radiology

## 2024-03-02 ENCOUNTER — Other Ambulatory Visit: Payer: Self-pay

## 2024-03-02 ENCOUNTER — Encounter: Payer: Self-pay | Admitting: Family Medicine

## 2024-03-02 VITALS — BP 142/86 | HR 89 | Temp 98.1°F | Ht 70.0 in | Wt 250.0 lb

## 2024-03-02 DIAGNOSIS — C9110 Chronic lymphocytic leukemia of B-cell type not having achieved remission: Secondary | ICD-10-CM

## 2024-03-02 DIAGNOSIS — I10 Essential (primary) hypertension: Secondary | ICD-10-CM | POA: Diagnosis not present

## 2024-03-02 MED ORDER — HYDROCHLOROTHIAZIDE 25 MG PO TABS: 25.0000 mg | ORAL_TABLET | Freq: Every day | ORAL | 3 refills | Status: AC

## 2024-03-02 NOTE — Progress Notes (Signed)
 Subjective:  Patient ID: David Whitehead, male    DOB: 08-17-1970  Age: 53 y.o. MRN: [id number]  CC:   Chief Complaint  Patient presents with   Hypertension    Been receiving chemo for CLL and will be placed on a new medication by specialist and needs BP to be well controlled    HPI:  53 year old male presents for evaluation of HTN.  BP has recently been elevated. Will be starting a new treatment for CLL soon. He states that the medication is associated with increase/elevate BP. As a result, he wants to get his blood pressure under better control. He states BP has been elevated at home.  Elevated here today. He is currently on Amlodipine  and is compliant. He is having some LE edema and wears compression stockings.  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   Hypertension    White matter abnormality on MRI of brain 02/02/2019   Iron  deficiency 01/20/2019   Pulmonary nodule 08/25/2018   CLL (chronic lymphocytic leukemia) (HCC) 04/01/2018   Normocytic anemia 04/01/2018   Sleep apnea in adult 03/31/2018    Social Hx   Social History   Socioeconomic History   Marital status: Married    Spouse name: Rexene   Number of children: 0   Years of education: 13   Highest education level: Associate degree: occupational, Scientist, product/process development, or vocational program  Occupational History   Not on file  Tobacco Use   Smoking status: Never   Smokeless tobacco: Never  Vaping Use   Vaping status: Never Used  Substance and Sexual Activity   Alcohol use: Not Currently   Drug use: Never   Sexual activity: Yes  Other Topics Concern   Not on file  Social History Narrative   Right handed    Lives with  wife, Rexene   Caffeine use: rare   Social Drivers of Corporate investment banker Strain: Low Risk  (03/01/2024)   Overall Financial Resource Strain (CARDIA)    Difficulty of Paying Living Expenses: Not hard at all  Food Insecurity: No Food Insecurity (03/01/2024)   Hunger Vital Sign    Worried About Running Out of Food in the Last Year: Never true    Ran Out of Food in the Last Year: Never true  Transportation Needs: No Transportation Needs (03/01/2024)   PRAPARE - Administrator, Civil Service (Medical): No    Lack of Transportation (Non-Medical): No  Physical Activity: Sufficiently Active (03/01/2024)   Exercise Vital Sign    Days of Exercise per Week: 7 days    Minutes of Exercise per Session: 60 min  Stress: No Stress Concern Present (03/01/2024)   [employer] of Occupational Health - Occupational Stress Questionnaire    Feeling of Stress: Not at all  Social Connections: Moderately Integrated (03/01/2024)   Social Connection and Isolation Panel    Frequency of Communication with Friends and Family: Three times a week    Frequency of Social Gatherings with Friends and Family: More than three times a week    Attends Religious Services: 1 to 4 times per year    Active Member of Clubs or Organizations: No    Attends [employer]  or Organization Meetings: Not on file    Marital Status: Married    Review of Systems Per HPI  Objective:  BP (!) 142/86   Pulse 89   Temp 98.1 F (36.7 C)   Ht 5' 10 (1.778 m)   Wt 250 lb (113.4 kg)   SpO2 97%   BMI 35.87 kg/m      03/02/2024    4:19 PM 03/02/2024    3:40 PM 02/22/2024   10:38 AM  BP/Weight  Systolic BP 142 150 149  Diastolic BP 86 85 99  Wt. (Lbs)  250 251  BMI  35.87 kg/m2 36.01 kg/m2    Physical Exam Constitutional:      Appearance: Normal appearance. He is obese.  HENT:     Head: Normocephalic and atraumatic.  Cardiovascular:     Rate and Rhythm: Normal rate and regular rhythm.  Pulmonary:      Effort: Pulmonary effort is normal.     Breath sounds: Normal breath sounds.  Neurological:     Mental Status: He is alert.  Psychiatric:        Mood and Affect: Mood normal.        Behavior: Behavior normal.     Lab Results  Component Value Date   WBC 23.4 (H) 02/03/2024   HGB 14.9 02/03/2024   HCT 44.3 02/03/2024   PLT 136 (L) 02/03/2024   GLUCOSE 131 (H) 02/03/2024   CHOL 150 10/28/2023   TRIG 258 (H) 10/28/2023   HDL 29 (L) 10/28/2023   LDLCALC 78 10/28/2023   ALT 57 (H) 02/03/2024   AST 42 (H) 02/03/2024   NA 143 02/03/2024   K 4.2 02/03/2024   CL 108 02/03/2024   CREATININE 1.04 02/03/2024   BUN 14 02/03/2024   CO2 25 02/03/2024   TSH 1.752 12/28/2018   INR 1.1 02/16/2019   HGBA1C 5.6 05/08/2021     Assessment & Plan:  Primary hypertension Assessment & Plan: Recently worsening. Currently uncontrolled/not at goal. Adding hydrochlorothiazide . Needs BMP in  ~ 1 week.  Orders: -     hydroCHLOROthiazide ; Take 1 tablet (25 mg total) by mouth daily.  Dispense: 90 tablet; Refill: 3 -     Basic metabolic panel with GFR    Follow-up:  Return in about 1 month (around 04/02/2024) for HTN follow up.  Jacqulyn Ahle DO [facility]

## 2024-03-02 NOTE — H&P (Signed)
 Chief Complaint: CLL with relapse; referred for image guided bone marrow biopsy for restaging purposes  Referring Provider(s): Ennever,P  Supervising Physician: Luverne Aran  Patient Status: [facility] - Out-pt  History of Present Illness: David Whitehead is a 53 y.o. 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  He is scheduled today for image guided bone marrow biopsy for restaging purposes.  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 Removed May 2023   COLONOSCOPY N/A 04/01/2019   Procedure: COLONOSCOPY;  Surgeon: Harvey Margo CROME, MD;  Location: [facility] ENDO SUITE;  Service: Endoscopy;  Laterality: N/A;  12:45pm   ESOPHAGOGASTRODUODENOSCOPY N/A 04/01/2019   Procedure: ESOPHAGOGASTRODUODENOSCOPY (EGD);  Surgeon: Harvey Margo CROME, MD;  Location: [facility] ENDO SUITE;  Service: Endoscopy;  Laterality: N/A;   EYE SURGERY  February 2023   Laser vision correction on my left eye only   FINGER SURGERY     SHUNT REMOVAL N/A 09/13/2021   Procedure: SHUNT REMOVAL;  Surgeon: Gillie Duncans, MD;  Location: [facility] OR;  Service: Neurosurgery;   Laterality: N/A;  RM 19   VENTRICULOPERITONEAL SHUNT Right 02/18/2019   Procedure: SHUNT INSERTION VENTRICULAR-PERITONEAL;  Surgeon: Gillie Duncans, MD;  Location: [facility] OR;  Service: Neurosurgery;  Laterality: Right;  SHUNT INSERTION VENTRICULAR-PERITONEAL    Allergies: Bactrim  [sulfamethoxazole -trimethoprim ], Doxycycline  calcium , Latex, Allopurinol , and Amoxicillin  Medications: Prior to Admission medications   Medication Sig Start Date End Date Taking? Authorizing Provider  acyclovir  (ZOVIRAX ) 400 MG tablet Take 1 tablet by mouth twice daily 02/04/24   Timmy Maude SAUNDERS, MD  amLODipine  (NORVASC ) 5 MG tablet Take 1 tablet (5 mg total) by mouth daily. 11/06/23   Alphonsa Glendia LABOR, MD  cetirizine (ZYRTEC) 10 MG tablet Take 10 mg by mouth daily.    [provider]  fluconazole  (DIFLUCAN ) 200 MG tablet Take 1 tablet (200 mg total) by mouth daily. 02/22/24   Fleeta Kathie Jomarie LOISE, MD  fluticasone  (CUTIVATE ) 0.05 % cream Apply bid prn to rash Patient not taking: Reported on 02/22/2024 08/18/22   Alphonsa Glendia LABOR, MD  fluticasone  (FLONASE ) 50 MCG/ACT nasal spray Place 2 sprays into both nostrils daily. 08/07/21   Alphonsa Glendia LABOR, MD  ondansetron  (ZOFRAN ) 8 MG tablet Take 1 tablet (8 mg total) by mouth every 8 (eight) hours as needed for nausea. 07/14/22   Alphonsa Glendia LABOR, MD  Prenatal Vit-Fe Fumarate-FA (PRENATAL MULTIVITAMIN) TABS tablet Take 1 tablet by mouth daily at 12 noon.    [provider]  rosuvastatin  (CRESTOR ) 5 MG tablet Take 1 tablet (5 mg total) by mouth at bedtime.  11/06/23   Alphonsa Glendia LABOR, MD  venetoclax  (VENCLEXTA ) 100 MG tablet TAKE 1 TABLET (100 MG) BY MOUTH DAILY. 09/07/23 09/06/24  Timmy Maude SAUNDERS, MD     Family History  Problem Relation Age of Onset   Colon cancer Father 72       passed from [facility]   Cancer Father    Colon cancer Paternal Uncle 65       passed from [facility]    Social History   Socioeconomic History   Marital status: Married    Spouse name: Rexene    Number of children: 0   Years of education: 13   Highest education level: Associate degree: occupational, Scientist, product/process development, or vocational program  Occupational History   Not on file  Tobacco Use   Smoking status: Never   Smokeless tobacco: Never  Vaping Use   Vaping status: Never Used  Substance and Sexual Activity   Alcohol use: Not Currently   Drug use: Never   Sexual activity: Yes  Other Topics Concern   Not on file  Social History Narrative   Right handed    Lives with wife, Rexene   Caffeine use: rare   Social Drivers of Corporate investment banker Strain: Low Risk  (03/01/2024)   Overall Financial Resource Strain (CARDIA)    Difficulty of Paying Living Expenses: Not hard at all  Food Insecurity: No Food Insecurity (03/01/2024)   Hunger Vital Sign    Worried About Running Out of Food in the Last Year: Never true    Ran Out of Food in the Last Year: Never true  Transportation Needs: No Transportation Needs (03/01/2024)   PRAPARE - Administrator, Civil Service (Medical): No    Lack of Transportation (Non-Medical): No  Physical Activity: Sufficiently Active (03/01/2024)   Exercise Vital Sign    Days of Exercise per Week: 7 days    Minutes of Exercise per Session: 60 min  Stress: No Stress Concern Present (03/01/2024)   [employer] of Occupational Health - Occupational Stress Questionnaire    Feeling of Stress: Not at all  Social Connections: Moderately Integrated (03/01/2024)   Social Connection and Isolation Panel    Frequency of Communication with Friends and Family: Three times a week    Frequency of Social Gatherings with Friends and Family: More than three times a week    Attends Religious Services: 1 to 4 times per year    Active Member of [employer] or Organizations: No    Attends Engineer, structural: Not on file    Marital Status: Married       Review of Systems  Vital Signs:   Advance Care Plan: No documents on file  Physical  Exam  Imaging: CT Soft Tissue Neck W Contrast Result Date: 02/14/2024 EXAM: CT NECK WITH CONTRAST 02/11/2024 11:31:08 AM TECHNIQUE: CT of the neck was performed with the administration of 125 mL of iohexol  (OMNIPAQUE ) 300 MG/ML solution. Multiplanar reformatted images are provided for review. Automated exposure control, iterative reconstruction, and/or weight based adjustment of the mA/kV was utilized to reduce the radiation dose to as low as reasonably achievable. COMPARISON: CT of the neck dated 11/10/2019. CLINICAL HISTORY: Hematologic malignancy, staging; Relapse of CLL. Assess for adenopathy. CLL (chronic lymphocytic leukemia) (HCC). FINDINGS: AERODIGESTIVE TRACT: No discrete mass. No edema. SALIVARY GLANDS: The parotid and submandibular glands are unremarkable. THYROID : Unremarkable. LYMPH NODES: There has been significant interval improvement of diffuse cervical lymphadenopathy. The largest right level 4 node is  seen on image 74 of series 305. It has decreased in size from 3.2 x 2.4 cm. The largest left level 4 node has decreased in size in the interim from 3.6 x 2.2 cm to approximately 3.3 x 1.7 cm. A right posterior cervical node measured on the previous study has decreased in long axis from 16 mm to 13 mm. There are numerous other lymph nodes present bilaterally, which have mildly to moderately decreased in size in the interim. SOFT TISSUES: No mass or fluid collection. BRAIN, ORBITS, SINUSES AND MASTOIDS: No acute abnormality. LUNGS AND MEDIASTINUM: No acute abnormality. BONES: No focal bone abnormality. IMPRESSION: 1. Significant interval improvement of diffuse cervical lymphadenopathy, including decrease of the largest right level 4 node from 3.2 x 2.4 cm to 2.7 x 1.7 cm, the largest left level 4 node from 3.6 x 2.2 cm to approximately 3.3 x 1.7 cm, and a right posterior cervical node from 16 mm to 13 mm. Electronically signed by: Evalene Coho MD 02/14/2024 12:30 PM EDT RP Workstation:  [id number]   CT CHEST ABDOMEN PELVIS W CONTRAST Result Date: 02/11/2024 CLINICAL DATA:  CLL, restaging.  * Tracking Code: BO * EXAM: CT CHEST, ABDOMEN, AND PELVIS WITH CONTRAST TECHNIQUE: Multidetector CT imaging of the chest, abdomen and pelvis was performed following the standard protocol during bolus administration of intravenous contrast. RADIATION DOSE REDUCTION: This exam was performed according to the departmental dose-optimization program which includes automated exposure control, adjustment of the mA and/or kV according to patient size and/or use of iterative reconstruction technique. CONTRAST:  OMNIPAQUE  IOHEXOL  300 MG/ML  SOLN COMPARISON:  Multiple priors including CT June 12, 2020 and August 30, 2021 FINDINGS: CT CHEST FINDINGS Cardiovascular: No significant vascular findings. Normal heart size. No pericardial effusion. Mediastinum/Nodes: Increased supraclavicular, bilateral axillary, mediastinal adenopathy with similar right hilar adenopathy. For reference: -right supraclavicular lymph node measures 13 mm in short axis on image 1/302 previously 7 mm -right axillary lymph node measures 2.1 cm in short axis on image 9/302 previously 6 mm -right paratracheal lymph node measures 11 mm in short axis on image 14/302 previously 8 mm -right hilar lymph node measures 17 mm in short axis on image 28/302 previously 18 mm Lungs/Pleura: Stable scattered tiny pulmonary nodules for instance a 5 mm ground-glass nodule in the right upper lobe on image 45/303 is unchanged. Musculoskeletal: No aggressive lytic or blastic lesion of bone. CT ABDOMEN PELVIS FINDINGS Hepatobiliary: Diffuse hepatic steatosis. No suspicious hepatic lesion. Gallbladder is unremarkable. No biliary ductal dilation. Pancreas: No pancreatic ductal dilation or evidence of acute inflammation. Spleen: Spleen measures 16.3 cm in maximum craniocaudal dimension previously 13 cm Adrenals/Urinary Tract: No suspicious adrenal nodule/mass. No  hydronephrosis. Kidneys demonstrate symmetric enhancement. Urinary bladder is nondistended. Stomach/Bowel: Stomach is minimally distended. No pathologic dilation of small or large bowel. No evidence of acute bowel inflammation. Vascular/Lymphatic: Normal caliber abdominal aorta. Smooth IVC contours. Increased retroperitoneal, right upper quadrant, mesenteric and iliac side chain lymphadenopathy. For reference: -aortocaval lymph node measures 2.3 cm in short axis on image 62/302 previously 9 mm -left common iliac lymph node measures 15 mm in short axis on image 94/302 previously 7 mm -right external iliac lymph node measures 2.7 cm in short axis on image 112/302 previously 12 mm Reproductive: Prostate is unremarkable. Other: No significant abdominopelvic free fluid. Musculoskeletal: No aggressive lytic or blastic lesion of the bone. Lumbar spondylosis. IMPRESSION: 1. Increased adenopathy above and below the diaphragm with increased splenomegaly, consistent with progression of disease. 2. Diffuse hepatic  steatosis. Electronically Signed   By: Reyes Holder M.D.   On: 02/11/2024 14:12    Labs:  CBC: Recent Labs    03/23/23 1156 08/03/23 1143 02/03/24 1146  WBC 7.1 9.0 23.4*  HGB 15.4 15.5 14.9  HCT 44.8 45.3 44.3  PLT 151 152 136*    COAGS: No results for input(s): INR, APTT in the last 8760 hours.  BMP: Recent Labs    03/23/23 1156 08/03/23 1143 02/03/24 1146  NA 143 143 143  K 3.8 4.1 4.2  CL 107 108 108  CO2 30 27 25   GLUCOSE 123* 91 131*  BUN 11 11 14   CALCIUM  9.4 8.9 9.5  CREATININE 1.08 1.01 1.04  GFRNONAA >60 >60 >60    LIVER FUNCTION TESTS: Recent Labs    03/23/23 1156 08/03/23 1143 10/28/23 1024 02/03/24 1146  BILITOT 0.5 0.4 0.5 0.4  AST 27 27 38 42*  ALT 44 45* 56* 57*  ALKPHOS 55 49 67 84  PROT 6.4* 6.2* 6.1 6.5  ALBUMIN 4.8 4.8 4.6 4.8    TUMOR MARKERS: No results for input(s): AFPTM, CEA, CA199, CHROMGRNA in the last 8760  hours.  Assessment and Plan: 53 y.o. 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  He is scheduled today for image guided bone marrow biopsy for restaging purposes.Risks and benefits of procedure was discussed with the patient and/or patient's family including, but not limited to bleeding, infection, damage to adjacent structures or low yield requiring additional tests.  All of the questions were answered and there is agreement to proceed.  Consent signed and in chart.    Thank you for allowing our service to participate in David Whitehead 's care.  Electronically Signed: D. Franky Rakers, PA-C   03/02/2024, 2:26 PM      I spent a total of  20 minutes   in face to face in clinical consultation, greater than 50% of which was counseling/coordinating care for image guided bone marrow biopsy

## 2024-03-02 NOTE — Assessment & Plan Note (Addendum)
 Recently worsening. Currently uncontrolled/not at goal. Adding hydrochlorothiazide . Needs BMP in  ~ 1 week.

## 2024-03-02 NOTE — Patient Instructions (Signed)
 Metabolic panel in ~ 7 days.  Follow up in 1 month.

## 2024-03-03 ENCOUNTER — Other Ambulatory Visit: Payer: Self-pay

## 2024-03-03 ENCOUNTER — Encounter (HOSPITAL_COMMUNITY): Payer: Self-pay

## 2024-03-03 ENCOUNTER — Ambulatory Visit (HOSPITAL_COMMUNITY)
Admission: RE | Admit: 2024-03-03 | Discharge: 2024-03-03 | Disposition: A | Discharge status: Home / Self Care | Source: Ambulatory Visit | Attending: Hematology & Oncology | Admitting: Hematology & Oncology

## 2024-03-03 DIAGNOSIS — E785 Hyperlipidemia, unspecified: Secondary | ICD-10-CM | POA: Insufficient documentation

## 2024-03-03 DIAGNOSIS — Z982 Presence of cerebrospinal fluid drainage device: Secondary | ICD-10-CM | POA: Diagnosis not present

## 2024-03-03 DIAGNOSIS — Z1379 Encounter for other screening for genetic and chromosomal anomalies: Secondary | ICD-10-CM | POA: Diagnosis not present

## 2024-03-03 DIAGNOSIS — C9110 Chronic lymphocytic leukemia of B-cell type not having achieved remission: Secondary | ICD-10-CM | POA: Insufficient documentation

## 2024-03-03 DIAGNOSIS — I10 Essential (primary) hypertension: Secondary | ICD-10-CM | POA: Insufficient documentation

## 2024-03-03 DIAGNOSIS — Z79899 Other long term (current) drug therapy: Secondary | ICD-10-CM | POA: Insufficient documentation

## 2024-03-03 DIAGNOSIS — G4730 Sleep apnea, unspecified: Secondary | ICD-10-CM | POA: Insufficient documentation

## 2024-03-03 DIAGNOSIS — D696 Thrombocytopenia, unspecified: Secondary | ICD-10-CM | POA: Diagnosis not present

## 2024-03-03 LAB — CBC WITH DIFFERENTIAL/PLATELET
Abs Immature Granulocytes: 0.05 K/uL (ref 0.00–0.07)
Basophils Absolute: 0.1 K/uL (ref 0.0–0.1)
Basophils Relative: 0 %
Eosinophils Absolute: 0.2 K/uL (ref 0.0–0.5)
Eosinophils Relative: 1 %
HCT: 47.1 % (ref 39.0–52.0)
Hemoglobin: 14.9 g/dL (ref 13.0–17.0)
Immature Granulocytes: 0 %
Lymphocytes Relative: 76 %
Lymphs Abs: 20.4 K/uL — ABNORMAL HIGH (ref 0.7–4.0)
MCH: 27.2 pg (ref 26.0–34.0)
MCHC: 31.6 g/dL (ref 30.0–36.0)
MCV: 85.9 fL (ref 80.0–100.0)
Monocytes Absolute: 4.1 K/uL — ABNORMAL HIGH (ref 0.1–1.0)
Monocytes Relative: 15 %
Neutro Abs: 2.1 K/uL (ref 1.7–7.7)
Neutrophils Relative %: 8 %
Platelets: 132 K/uL — ABNORMAL LOW (ref 150–400)
RBC: 5.48 MIL/uL (ref 4.22–5.81)
RDW: 12.5 % (ref 11.5–15.5)
Smear Review: NORMAL
WBC: 26.9 K/uL — ABNORMAL HIGH (ref 4.0–10.5)
nRBC: 0.1 % (ref 0.0–0.2)

## 2024-03-03 MED ORDER — MIDAZOLAM HCL 2 MG/2ML IJ SOLN
INTRAMUSCULAR | Status: AC
  Filled 2024-03-03: qty 2

## 2024-03-03 MED ORDER — FENTANYL CITRATE (PF) 100 MCG/2ML IJ SOLN
INTRAMUSCULAR | Status: AC
  Filled 2024-03-03: qty 2

## 2024-03-03 MED ORDER — FENTANYL CITRATE (PF) 100 MCG/2ML IJ SOLN
INTRAMUSCULAR | Status: AC | PRN
  Administered 2024-03-03: 50 ug via INTRAVENOUS

## 2024-03-03 MED ORDER — SODIUM CHLORIDE 0.9 % IV SOLN: INTRAVENOUS | Status: DC

## 2024-03-03 MED ORDER — MIDAZOLAM HCL (PF) 2 MG/2ML IJ SOLN
INTRAMUSCULAR | Status: AC | PRN
  Administered 2024-03-03: 1 mg via INTRAVENOUS

## 2024-03-03 NOTE — Sedation Documentation (Signed)
 RN Andrez Lieurance pulled 2 mg Versed  and 100 mcg Fentanyl  in CT Pysix. Pt. Received 2 mg Versed  and 100 mcg Fentanyl  throughout the procedure.

## 2024-03-03 NOTE — Procedures (Signed)
 Interventional Radiology Procedure Note  Procedure: CT guided bone marrow aspiration and biopsy  Complications: None  EBL: < 10 mL  Findings: Aspirate and core biopsy performed of bone marrow in right iliac bone.  Plan: Bedrest supine x 1 hrs  Ericha Whittingham T. Fredia Sorrow, M.D Pager:  [phone]

## 2024-03-03 NOTE — Discharge Instructions (Addendum)
Discharge Instructions:   Please call Interventional Radiology clinic [phone] with any questions or concerns.  You may remove your dressing and shower tomorrow.    Bone Marrow Aspiration and Bone Marrow Biopsy, Adult, Care After This sheet gives you information about how to care for yourself after your procedure. Your health care provider may also give you more specific instructions. If you have problems or questions, contact your health care provider. What can I expect after the procedure? After the procedure, it is common to have: Mild pain and tenderness. Swelling. Bruising. Follow these instructions at home: Puncture site care  Follow instructions from your health care provider about how to take care of the puncture site. Make sure you: Wash your hands with soap and water before and after you change your bandage (dressing). If soap and water are not available, use hand sanitizer. Change your dressing as told by your health care provider. Check your puncture site every day for signs of infection. Check for: More redness, swelling, or pain. Fluid or blood. Warmth. Pus or a bad smell. Activity Return to your normal activities as told by your health care provider. Ask your health care provider what activities are safe for you. Do not lift anything that is heavier than 10 lb (4.5 kg), or the limit that you are told, until your health care provider says that it is safe. Do not drive for 24 hours if you were given a sedative during your procedure. General instructions  Take over-the-counter and prescription medicines only as told by your health care provider. Do not take baths, swim, or use a hot tub until your health care provider approves. Ask your health care provider if you may take showers. You may only be allowed to take sponge baths. If directed, put ice on the affected area. To do this: Put ice in a plastic bag. Place a towel between your skin and the bag. Leave the ice  on for 20 minutes, 2-3 times a day. Keep all follow-up visits as told by your health care provider. This is important. Contact a health care provider if: Your pain is not controlled with medicine. You have a fever. You have more redness, swelling, or pain around the puncture site. You have fluid or blood coming from the puncture site. Your puncture site feels warm to the touch. You have pus or a bad smell coming from the puncture site. Summary After the procedure, it is common to have mild pain, tenderness, swelling, and bruising. Follow instructions from your health care provider about how to take care of the puncture site and what activities are safe for you. Take over-the-counter and prescription medicines only as told by your health care provider. Contact a health care provider if you have any signs of infection, such as fluid or blood coming from the puncture site. This information is not intended to replace advice given to you by your health care provider. Make sure you discuss any questions you have with your health care provider. Document Revised: 09/14/2018 Document Reviewed: 09/14/2018 Elsevier Patient Education  2023 Elsevier Inc.   Moderate Conscious Sedation, Adult, Care After This sheet gives you information about how to care for yourself after your procedure. Your health care provider may also give you more specific instructions. If you have problems or questions, contact your health care provider. What can I expect after the procedure? After the procedure, it is common to have: Sleepiness for several hours. Impaired judgment for several hours. Difficulty with balance. Vomiting if   you eat too soon. Follow these instructions at home: For the time period you were told by your health care provider: Rest. Do not participate in activities where you could fall or become injured. Do not drive or use machinery. Do not drink alcohol. Do not take sleeping pills or medicines that  cause drowsiness. Do not make important decisions or sign legal documents. Do not take care of children on your own. Eating and drinking  Follow the diet recommended by your health care provider. Drink enough fluid to keep your urine pale yellow. If you vomit: Drink water, juice, or soup when you can drink without vomiting. Make sure you have little or no nausea before eating solid foods. General instructions Take over-the-counter and prescription medicines only as told by your health care provider. Have a responsible adult stay with you for the time you are told. It is important to have someone help care for you until you are awake and alert. Do not smoke. Keep all follow-up visits as told by your health care provider. This is important. Contact a health care provider if: You are still sleepy or having trouble with balance after 24 hours. You feel light-headed. You keep feeling nauseous or you keep vomiting. You develop a rash. You have a fever. You have redness or swelling around the IV site. Get help right away if: You have trouble breathing. You have new-onset confusion at home. Summary After the procedure, it is common to feel sleepy, have impaired judgment, or feel nauseous if you eat too soon. Rest after you get home. Know the things you should not do after the procedure. Follow the diet recommended by your health care provider and drink enough fluid to keep your urine pale yellow. Get help right away if you have trouble breathing or new-onset confusion at home. This information is not intended to replace advice given to you by your health care provider. Make sure you discuss any questions you have with your health care provider. Document Revised: 08/26/2019 Document Reviewed: 03/24/2019 Elsevier Patient Education  2023 Elsevier Inc.  

## 2024-03-03 NOTE — Progress Notes (Signed)
 1035 Ice pack given given to use for comfort to mid back per instructions.

## 2024-03-07 LAB — SURGICAL PATHOLOGY

## 2024-03-08 ENCOUNTER — Other Ambulatory Visit: Payer: Self-pay | Admitting: Hematology & Oncology

## 2024-03-08 DIAGNOSIS — C9110 Chronic lymphocytic leukemia of B-cell type not having achieved remission: Secondary | ICD-10-CM

## 2024-03-10 ENCOUNTER — Inpatient Hospital Stay: Admitting: Hematology & Oncology

## 2024-03-10 ENCOUNTER — Inpatient Hospital Stay: Attending: Hematology & Oncology

## 2024-03-10 ENCOUNTER — Encounter: Payer: Self-pay | Admitting: Hematology

## 2024-03-10 ENCOUNTER — Telehealth: Payer: Self-pay

## 2024-03-10 ENCOUNTER — Telehealth: Payer: Self-pay | Admitting: Pharmacist

## 2024-03-10 ENCOUNTER — Other Ambulatory Visit (HOSPITAL_COMMUNITY): Payer: Self-pay

## 2024-03-10 VITALS — BP 132/97 | HR 106 | Temp 98.6°F | Resp 16 | Wt 249.0 lb

## 2024-03-10 DIAGNOSIS — B451 Cerebral cryptococcosis: Secondary | ICD-10-CM | POA: Diagnosis not present

## 2024-03-10 DIAGNOSIS — C9110 Chronic lymphocytic leukemia of B-cell type not having achieved remission: Secondary | ICD-10-CM | POA: Insufficient documentation

## 2024-03-10 DIAGNOSIS — R590 Localized enlarged lymph nodes: Secondary | ICD-10-CM | POA: Diagnosis not present

## 2024-03-10 LAB — CBC WITH DIFFERENTIAL (CANCER CENTER ONLY)
Basophils Absolute: 0 K/uL (ref 0.0–0.1)
Basophils Relative: 0 %
Eosinophils Absolute: 0 K/uL (ref 0.0–0.5)
Eosinophils Relative: 0 %
HCT: 48.6 % (ref 39.0–52.0)
Hemoglobin: 16 g/dL (ref 13.0–17.0)
Lymphocytes Relative: 89 %
Lymphs Abs: 38.6 K/uL — ABNORMAL HIGH (ref 0.7–4.0)
MCH: 28.1 pg (ref 26.0–34.0)
MCHC: 32.9 g/dL (ref 30.0–36.0)
MCV: 85.3 fL (ref 80.0–100.0)
Monocytes Absolute: 1.7 K/uL — ABNORMAL HIGH (ref 0.1–1.0)
Monocytes Relative: 4 %
Neutro Abs: 3 K/uL (ref 1.7–7.7)
Neutrophils Relative %: 7 %
Platelet Count: 147 K/uL — ABNORMAL LOW (ref 150–400)
RBC: 5.7 MIL/uL (ref 4.22–5.81)
RDW: 12.4 % (ref 11.5–15.5)
Smear Review: NORMAL
WBC Count: 43.4 K/uL — ABNORMAL HIGH (ref 4.0–10.5)
nRBC: 0.1 % (ref 0.0–0.2)

## 2024-03-10 LAB — CMP (CANCER CENTER ONLY)
ALT: 61 U/L — ABNORMAL HIGH (ref 0–44)
AST: 37 U/L (ref 15–41)
Albumin: 4.8 g/dL (ref 3.5–5.0)
Alkaline Phosphatase: 111 U/L (ref 38–126)
Anion gap: 10 (ref 5–15)
BUN: 15 mg/dL (ref 6–20)
CO2: 27 mmol/L (ref 22–32)
Calcium: 9.9 mg/dL (ref 8.9–10.3)
Chloride: 105 mmol/L (ref 98–111)
Creatinine: 1.15 mg/dL (ref 0.61–1.24)
GFR, Estimated: >60 mL/min (ref >60)
Glucose, Bld: 195 mg/dL — ABNORMAL HIGH (ref 70–99)
Potassium: 4.2 mmol/L (ref 3.5–5.1)
Sodium: 141 mmol/L (ref 135–145)
Total Bilirubin: 0.3 mg/dL (ref 0.0–1.2)
Total Protein: 6.6 g/dL (ref 6.5–8.1)

## 2024-03-10 LAB — LACTATE DEHYDROGENASE: LDH: 299 U/L — ABNORMAL HIGH (ref 98–192)

## 2024-03-10 LAB — SAVE SMEAR(SSMR), FOR PROVIDER SLIDE REVIEW

## 2024-03-10 MED ORDER — PIRTOBRUTINIB 100 MG PO TABS
200.0000 mg | ORAL_TABLET | Freq: Every day | ORAL | 12 refills | Status: AC
  Filled 2024-03-11: qty 60, 30d supply, fill #0
  Filled 2024-03-31: qty 60, 30d supply, fill #1
  Filled 2024-05-02 – 2024-05-03 (×2): qty 60, 30d supply, fill #2
  Filled 2024-06-01 – 2024-06-02 (×2): qty 60, 30d supply, fill #3

## 2024-03-10 NOTE — Progress Notes (Signed)
 Hematology and Oncology Follow Up Visit  David Whitehead [id number] 19-May-1970 53 y.o. 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  Unfortunately, we certainly have recurrence.  We did do a workup on him.  When we saw him back in September, he had a markedly elevated white cell count.  We did go ahead and do a bone marrow biopsy on him.  This was done on 03/03/2024.  The pathology report ([id number]) showed CLL.  He has 100% leukemic cells.  We did do a CT of his neck and body.  He did have increased adenopathy throughout the neck and his body/abdomen/pelvis.  He also had new splenomegaly.  We clearly need to get him started on treatment.  I think that he would be a great candidate for Jaypirca.  I think this would be standard of care for him since he has had a relapse.  He feels okay.  He really has no complaints.  He has not noted any swollen lymph nodes.  He has had no fever.  He has had no nausea or vomiting.  There is been no change in bowel or bladder habits.  He has had no rashes.  There has been no bleeding.  Overall, I would have to say that his performance status is probably ECOG 0.  Medications:  Allergies as of 03/10/2024       Reactions   Bactrim  [sulfamethoxazole -trimethoprim ] Rash   Doxycycline  Calcium  Itching   felt like pins and needles were in face after one dose.   Latex Other (See Comments)   Took skin off when removed tape   Allopurinol  Itching, Rash   Amoxicillin Rash   Has patient had a PCN reaction causing immediate rash, facial/tongue/throat swelling, SOB or lightheadedness with hypotension: No Has patient had a PCN reaction  causing severe rash involving mucus membranes or skin necrosis: No Has patient had a PCN reaction that required hospitalization: No Has patient had a PCN reaction occurring within the last 10 years: No If all of the above answers are NO, then may proceed with Cephalosporin use.        Medication List        Accurate as of March 10, 2024  1:50 PM. If you have any questions, ask your nurse or doctor.          acyclovir  400 MG tablet Commonly known as: ZOVIRAX  Take 1 tablet by mouth twice daily   amLODipine  5 MG tablet Commonly known as: NORVASC  Take 1 tablet (5 mg total) by mouth daily.   cetirizine 10 MG tablet Commonly known as: ZYRTEC Take 10 mg by mouth daily.   fluconazole  200 MG tablet Commonly known as: DIFLUCAN  Take 1 tablet (200 mg total) by mouth daily.   fluticasone  0.05 % cream Commonly known as: CUTIVATE  Apply bid prn to rash   fluticasone  50 MCG/ACT nasal spray Commonly known as: FLONASE  Place 2 sprays into both nostrils daily.   hydrochlorothiazide  25 MG tablet Commonly known as: HYDRODIURIL  Take 1 tablet (25 mg total) by mouth daily.   ondansetron  8 MG tablet Commonly known as: Zofran  Take 1 tablet (8  mg total) by mouth every 8 (eight) hours as needed for nausea.   prenatal multivitamin Tabs tablet Take 1 tablet by mouth daily at 12 noon.   rosuvastatin  5 MG tablet Commonly known as: Crestor  Take 1 tablet (5 mg total) by mouth at bedtime.   Venclexta  100 MG tablet Generic drug: venetoclax  TAKE 1 TABLET (100 MG) BY MOUTH DAILY.        Allergies:  Allergies  Allergen Reactions   Bactrim  [Sulfamethoxazole -Trimethoprim ] Rash   Doxycycline  Calcium  Itching    felt like pins and needles were in face after one dose.   Latex Other (See Comments)    Took skin off when removed tape   Allopurinol  Itching and Rash   Amoxicillin Rash    Has patient had a PCN reaction causing immediate rash, facial/tongue/throat swelling, SOB or  lightheadedness with hypotension: No Has patient had a PCN reaction causing severe rash involving mucus membranes or skin necrosis: No Has patient had a PCN reaction that required hospitalization: No Has patient had a PCN reaction occurring within the last 10 years: No If all of the above answers are NO, then may proceed with Cephalosporin use.     Past Medical History, Surgical history, Social history, and Family History were reviewed and updated.  Review of Systems: Review of Systems  Constitutional: Negative.   HENT: Negative.    Eyes: Negative.   Respiratory: Negative.    Cardiovascular: Negative.   Gastrointestinal: Negative.   Genitourinary: Negative.   Musculoskeletal: Negative.  Negative for myalgias.  Skin: Negative.   Neurological: Negative.   Endo/Heme/Allergies: Negative.   Psychiatric/Behavioral: Negative.       Physical Exam:  Vital signs show temperature of 98.6.  Pulse 100.  Blood pressure 132/97.  Weight is 249 pounds.  Wt Readings from Last 3 Encounters:  03/03/24 250 lb 3.6 oz (113.5 kg)  03/02/24 250 lb (113.4 kg)  02/22/24 251 lb (113.9 kg)    Physical Exam Vitals reviewed.  Constitutional:      Comments: I can palpate a lymph node in both axilla.  They are small.  They are mobile.  HENT:     Head: Normocephalic and atraumatic.  Eyes:     Pupils: Pupils are equal, round, and reactive to light.  Cardiovascular:     Rate and Rhythm: Normal rate and regular rhythm.     Heart sounds: Normal heart sounds.  Pulmonary:     Effort: Pulmonary effort is normal.     Breath sounds: Normal breath sounds.  Abdominal:     General: Bowel sounds are normal.     Palpations: Abdomen is soft.  Musculoskeletal:        General: No tenderness or deformity. Normal range of motion.     Cervical back: Normal range of motion.  Lymphadenopathy:     Cervical: No cervical adenopathy.  Skin:    General: Skin is warm and dry.     Findings: No erythema or rash.   Neurological:     Mental Status: He is alert and oriented to person, place, and time.  Psychiatric:        Behavior: Behavior normal.        Thought Content: Thought content normal.        Judgment: Judgment normal.     Lab Results  Component Value Date   WBC 26.9 (H) 03/03/2024   HGB 14.9 03/03/2024   HCT 47.1 03/03/2024   MCV 85.9 03/03/2024   PLT 132 (L) 03/03/2024  Lab Results  Component Value Date   FERRITIN 42 11/02/2019   IRON  48 11/02/2019   TIBC 329 11/02/2019   UIBC 280 11/02/2019   IRONPCTSAT 15 (L) 11/02/2019   Lab Results  Component Value Date   RBC 5.48 03/03/2024   Lab Results  Component Value Date   KPAFRELGTCHN 29.3 (H) 08/03/2023   LAMBDASER 3.7 (L) 08/03/2023   KAPLAMBRATIO 7.92 (H) 08/03/2023   Lab Results  Component Value Date   IGGSERUM 496 (L) 08/03/2023   IGA 8 (L) 08/03/2023   IGMSERUM <5 (L) 08/03/2023   Lab Results  Component Value Date   TOTALPROTELP 6.4 08/03/2023   ALBUMINELP 4.3 08/03/2023   A1GS 0.2 08/03/2023   A2GS 0.5 08/03/2023   BETS 0.9 08/03/2023   GAMS 0.5 08/03/2023   MSPIKE Not Observed 08/03/2023   SPEI Comment 03/23/2023     Chemistry      Component Value Date/Time   NA 143 02/03/2024 1146   NA 139 06/05/2021 0950   K 4.2 02/03/2024 1146   CL 108 02/03/2024 1146   CO2 25 02/03/2024 1146   BUN 14 02/03/2024 1146   BUN 18 06/05/2021 0950   CREATININE 1.04 02/03/2024 1146   CREATININE 1.07 02/26/2021 1130      Component Value Date/Time   CALCIUM  9.5 02/03/2024 1146   ALKPHOS 84 02/03/2024 1146   AST 42 (H) 02/03/2024 1146   ALT 57 (H) 02/03/2024 1146   BILITOT 0.4 02/03/2024 1146     Elysia his peripheral blood smear.  He clearly has relapsed.  He has marked lymphocytosis.  He has smudge cells.  I do not see any nucleated red blood cells.  Platelets are adequate in number and size.  Impression and Plan: Mr. Flenner is a very pleasant 53 yo gentleman with an aggressive CLL by virtue of the  cytogenetics.   Unfortunately, he has recurrence now.  He will be interested to see what the cytogenetics and the FISH panel tells us .  Again, I think the Jaypirca would be a very good idea for him.  I think this would be a very helpful and effective agent.  It is oral.  I know that he does have a history of the cryptococcal meningitis.  I do not think that the Jaypirca should affect this all that much.  Again, I told him that with Jaypirca, his white cell count will go up initially and then go back down.  He also may notice some more in the way of palpable lymph nodes before they improve.  I truly believe that we can get him into remission.  If not, then I think we will be looking at some form of CAR-T therapy for him.  We will see about getting the Jaypirca started hopefully within a week.  I sent the prescription off to the [facility].  I would like to see him back after Thanksgiving.  By then, we will see how his labs look.  I would think that his white cell count probably should be significantly higher.     Maude JONELLE Crease, MD 10/30/20251:50 PM

## 2024-03-10 NOTE — Telephone Encounter (Signed)
 Oral Oncology Pharmacist Encounter  Received new prescription for Jaypirca (pirtobrutinib) for the treatment of CLL, planned duration until disease progression or unacceptable drug toxicity.   CBC w/ Diff and CMP from 03/10/24 assessed, no relevant lab abnormalities requiring baseline dose adjustment required at this time. Prescription dose and frequency assessed for appropriateness.  Current medication list in Epic reviewed, DDIs with Jaypirca identified: Category C drug-drug interaction between Jaypirca and Fluconazole  - Fluconazole , a moderate CYP3A4 inhibitor may increase serum concentrations of Jaypirca. Monitor patient for increased side effects from Jaypirca. No changes in therapy warranted at this time.  Category C drug-drug interaction between Jaypirca and Crestor  - Jaypirca may increase serum concentrations, thus side effects of Crestor . Monitor patient for myalgias, changes in LFTs while on both agents. Noted patient is on low dose Crestor  (5 mg daily)  Evaluated chart and no patient barriers to medication adherence noted.   Prescription has been e-scribed to the [facility] for benefits analysis and approval.  Oral Oncology Clinic will continue to follow for insurance authorization, copayment issues, initial counseling and start date.  Asberry Macintosh, PharmD, BCPS, BCOP Hematology/Oncology Clinical Pharmacist [phone] 03/10/2024 3:26 PM

## 2024-03-10 NOTE — Telephone Encounter (Signed)
 Oral Oncology Patient Advocate Encounter   Received notification that prior authorization for JAYPIRCA is required.   PA submitted on 03/10/24 Key [id number] Status is pending      Charlott Hamilton,  CPhT-Adv  she/her/hers [employer]  [facility] Specialty Pharmacy Services Pharmacy Technician Patient Advocate Specialist III [facility] Phone: [phone]  Fax: [phone] [email or web address]

## 2024-03-11 ENCOUNTER — Other Ambulatory Visit: Payer: Self-pay

## 2024-03-11 ENCOUNTER — Encounter (HOSPITAL_COMMUNITY): Payer: Self-pay | Admitting: Hematology & Oncology

## 2024-03-11 ENCOUNTER — Other Ambulatory Visit (HOSPITAL_COMMUNITY): Payer: Self-pay

## 2024-03-11 LAB — KAPPA/LAMBDA LIGHT CHAINS
Kappa free light chain: 89.1 mg/L — ABNORMAL HIGH (ref 3.3–19.4)
Kappa, lambda light chain ratio: 22.28 — ABNORMAL HIGH (ref 0.26–1.65)
Lambda free light chains: 4 mg/L — ABNORMAL LOW (ref 5.7–26.3)

## 2024-03-11 LAB — BETA 2 MICROGLOBULIN, SERUM: Beta-2 Microglobulin: 2.6 mg/L — ABNORMAL HIGH (ref 0.6–2.4)

## 2024-03-11 NOTE — Telephone Encounter (Signed)
 Oral Oncology Patient Advocate Encounter  Prior Authorization for JAYPIRCA  has been approved.    PA# [id number] Effective dates: 03/11/2024 through 03/10/2025  Patients co-pay is $0.00.     Charlott Hamilton,  CPhT-Adv  she/her/hers [facility]  [facility] Specialty Pharmacy Services Pharmacy Technician Patient Advocate Specialist III [facility] Phone: [phone]  Fax: [phone] [email or web address]

## 2024-03-11 NOTE — Progress Notes (Signed)
 Specialty Pharmacy Initial Fill Coordination Note  David Whitehead is a 53 y.o. male contacted today regarding refills of specialty medication(s) Pirtobrutinib (JAYPIRCA) .  Patient requested Delivery  on 03/15/24  to verified address [address] [place] [address]   Medication will be filled on 03/14/2024.   Patient is aware of $0.00 copayment.

## 2024-03-11 NOTE — Telephone Encounter (Signed)
 Oral Chemotherapy Pharmacist Encounter  Patient Education I spoke with patient for overview of new oral chemotherapy medication: David Whitehead (pirtobrutinib) for the treatment of CLL, planned duration until disease progression or unacceptable drug toxicity.   Treatment goal: Palliative  Counseled patient on administration, dosing, side effects, monitoring, drug-food interactions, safe handling, storage, and disposal.  Patient will take David Whitehead 100 mg tablet, 2 tablets (200 mg total) by mouth daily.  Patient knows to avoid grapefruit and grapefruit juice while on therapy.  Start date: 03/16/24 (medication will deliver to patient's home on 03/15/24)  Side effects include but not limited to: decreased blood counts, bruising, diarrhea, fatigue, joint/muscle pain  Reviewed with patient importance of keeping a medication schedule and plan for any missed doses.  After discussion with patient no patient barriers to medication adherence identified.   Reviewed the following drug-drug interactions with [name] and Fluconazole  - patient knows to monitor for increased side effects from David Whitehead (fluconazole  can increase serum concentrations of David Whitehead) and alert the office of any changes. Informed patient that there is not an upfront dose adjustment to the David Whitehead recommended at this time based on this interaction. David Whitehead and Crestor  - patient knows to monitor for increased muscle aches, coca cola colored urine as possible signs of changes in LFTs. We discussed David Whitehead can increase serum concentrations, thus risk of side effects from Crestor . However with patient being on low dose of Crestor  (5 mg daily) - no changes are warranted at this time.   Distress thermometer flowsheet: Distress thermometer not completed during telephone call as patient has been on previous lines of therapy.   Communication and Learning Assessment Primary learner: Patient Barriers to learning: No barriers Preferred  language: English Learning preferences: Listening Reading  David Whitehead voiced understanding and appreciation. All questions answered. Medication handout provided.  Provided patient with Oral Chemotherapy Navigation Clinic phone number. Patient knows to call the office with questions or concerns.  Asberry Macintosh, PharmD, BCPS, BCOP Hematology/Oncology Clinical Pharmacist [phone] 03/11/2024 10:57 AM

## 2024-03-11 NOTE — Progress Notes (Signed)
 Oral Chemotherapy Pharmacist Encounter  Patient was counseled under telephone encounter from 03/10/24.  David Whitehead, PharmD, BCPS, BCOP Hematology/Oncology Clinical Pharmacist [facility] Oral Chemotherapy Navigation Clinics [phone] 03/11/2024 10:59 AM

## 2024-03-12 LAB — IGG, IGA, IGM
IgA: 7 mg/dL — ABNORMAL LOW (ref 90–386)
IgG (Immunoglobin G), Serum: 496 mg/dL — ABNORMAL LOW (ref 603–1613)
IgM (Immunoglobulin M), Srm: <5 mg/dL — ABNORMAL LOW (ref 20–172)

## 2024-03-14 ENCOUNTER — Other Ambulatory Visit: Payer: Self-pay

## 2024-03-15 LAB — PROTEIN ELECTROPHORESIS, SERUM, WITH REFLEX
A/G Ratio: 1.8 — ABNORMAL HIGH (ref 0.7–1.7)
Albumin ELP: 4.2 g/dL (ref 2.9–4.4)
Alpha-1-Globulin: 0.2 g/dL (ref 0.0–0.4)
Alpha-2-Globulin: 0.6 g/dL (ref 0.4–1.0)
Beta Globulin: 1.1 g/dL (ref 0.7–1.3)
Gamma Globulin: 0.4 g/dL (ref 0.4–1.8)
Globulin, Total: 2.3 g/dL (ref 2.2–3.9)
Total Protein ELP: 6.5 g/dL (ref 6.0–8.5)

## 2024-03-16 ENCOUNTER — Encounter (HOSPITAL_COMMUNITY): Payer: Self-pay | Admitting: Hematology & Oncology

## 2024-03-31 ENCOUNTER — Other Ambulatory Visit: Payer: Self-pay

## 2024-03-31 NOTE — Progress Notes (Signed)
 Specialty Pharmacy Refill Coordination Note  David Whitehead is a 53 y.o. male contacted today regarding refills of specialty medication(s) Pirtobrutinib [name])   Patient requested Delivery   Delivery date: 04/06/24   Verified address: [address] [place] [address]   Medication will be filled on: 04/05/24

## 2024-03-31 NOTE — Progress Notes (Signed)
 Specialty Pharmacy Ongoing Clinical Assessment Note  David Whitehead is a 53 y.o. male who is being followed by the specialty pharmacy service for RxSp Oncology   Patient's specialty medication(s) reviewed today: Pirtobrutinib (JAYPIRCA)   Missed doses in the last 4 weeks: 0   Patient/Caregiver did not have any additional questions or concerns.   Therapeutic benefit summary: Unable to assess   Adverse events/side effects summary: No adverse events/side effects   Patient's therapy is appropriate to: Continue    Goals Addressed             This Visit's Progress    Slow Disease Progression   No change    Patient is initiating therapy. Patient will maintain adherence         Follow up: 3 months  Silvano LOISE Dolly Specialty Pharmacist   Clinical Intervention Note  Clinical Intervention Notes: Patient reported adding on HCTZ, no DDIs were identified with [name].   Clinical Intervention Outcomes: Prevention of an adverse drug event   Silvano LOISE Dolly Karel Santa

## 2024-04-01 ENCOUNTER — Ambulatory Visit: Admitting: Family Medicine

## 2024-04-05 ENCOUNTER — Other Ambulatory Visit: Payer: Self-pay

## 2024-04-05 ENCOUNTER — Other Ambulatory Visit: Payer: Self-pay | Admitting: Hematology & Oncology

## 2024-04-05 DIAGNOSIS — C9110 Chronic lymphocytic leukemia of B-cell type not having achieved remission: Secondary | ICD-10-CM

## 2024-04-15 ENCOUNTER — Ambulatory Visit: Payer: Medicare Other

## 2024-04-15 VITALS — Ht 70.0 in | Wt 249.0 lb

## 2024-04-15 DIAGNOSIS — Z0000 Encounter for general adult medical examination without abnormal findings: Secondary | ICD-10-CM

## 2024-04-15 NOTE — Patient Instructions (Signed)
 [name],  Thank you for taking the time for your Medicare Wellness Visit. I appreciate your continued commitment to your health goals. Please review the care plan we discussed, and feel free to reach out if I can assist you further.  Please note that Annual Wellness Visits do not include a physical exam. Some assessments may be limited, especially if the visit was conducted virtually. If needed, we may recommend an in-person follow-up with your provider.  Ongoing Care Seeing your primary care provider every 3 to 6 months helps us  monitor your health and provide consistent, personalized care.   Referrals If a referral was made during today's visit and you haven't received any updates within two weeks, please contact the referred provider directly to check on the status.  Recommended Screenings:  Health Maintenance  Topic Date Due   DTaP/Tdap/Td vaccine (1 - Tdap) Never done   Pneumococcal Vaccine for age over 39 (1 of 2 - PCV) Never done   Hepatitis B Vaccine (1 of 3 - 19+ 3-dose series) Never done   Zoster (Shingles) Vaccine (1 of 2) Never done   COVID-19 Vaccine (3 - Moderna risk series) 08/31/2019   Colon Cancer Screening  03/31/2024   Medicare Annual Wellness Visit  04/15/2025   Flu Shot  Completed   Hepatitis C Screening  Completed   HIV Screening  Completed   HPV Vaccine  Aged Out   Meningitis B Vaccine  Aged Out       04/11/2024    9:06 AM  Advanced Directives  Does Patient Have a Medical Advance Directive? No  Would patient like information on creating a medical advance directive? Yes (MAU/Ambulatory/Procedural Areas - Information given)   Information on Advanced Care Planning can be found at [place]  [employer] [facility] [place] [facility] Advance Health Care Directives Advance Health Care Directives ([email or web address]   Vision: Annual vision screenings are recommended for early detection of glaucoma, cataracts, and diabetic retinopathy. These exams can also reveal signs of chronic  conditions such as diabetes and high blood pressure.  Dental: Annual dental screenings help detect early signs of oral cancer, gum disease, and other conditions linked to overall health, including heart disease and diabetes.  Please see the attached documents for additional preventive care recommendations.

## 2024-04-15 NOTE — Progress Notes (Signed)
 Chief Complaint  Patient presents with   Medicare Wellness     Subjective:   David Whitehead is a 53 y.o. male who presents for a Medicare Annual Wellness Visit.  Visit info / Clinical Intake: Medicare Wellness Visit Type:: Subsequent Annual Wellness Visit Persons participating in visit and providing information:: patient Medicare Wellness Visit Mode:: Telephone If telephone:: video declined Since this visit was completed virtually, some vitals may be partially provided or unavailable. Missing vitals are due to the limitations of the virtual format.: Documented vitals are patient reported If Telephone or Video please confirm:: I connected with patient using audio/video enable telemedicine. I verified patient identity with two identifiers, discussed telehealth limitations, and patient agreed to proceed. Patient Location:: home Provider Location:: home office Interpreter Needed?: No Pre-visit prep was completed: yes AWV questionnaire completed by patient prior to visit?: yes Date:: 04/11/24 Living arrangements:: lives with spouse/significant other Patient's Overall Health Status Rating: good Typical amount of pain: some Does pain affect daily life?: no Are you currently prescribed opioids?: no  Dietary Habits and Nutritional Risks How many meals a day?: 3 Eats fruit and vegetables daily?: yes Most meals are obtained by: having others provide food In the last 2 weeks, have you had any of the following?: none Diabetic:: no  Functional Status Activities of Daily Living (to include ambulation/medication): (Patient-Rptd) Independent Ambulation: (Patient-Rptd) Independent Medication Administration: Independent Home Management (perform basic housework or laundry): (Patient-Rptd) Independent Manage your own finances?: yes Primary transportation is: driving Concerns about vision?: no *vision screening is required for WTM* Concerns about hearing?: no  Fall Screening Falls in the  past year?: (Patient-Rptd) 0 Number of falls in past year: 0 Was there an injury with Fall?: 0 Fall Risk Category Calculator: 0 Patient Fall Risk Level: Low Fall Risk  Fall Risk Patient at Risk for Falls Due to: No Fall Risks Fall risk Follow up: Falls evaluation completed; Education provided; Falls prevention discussed  Home and Transportation Safety: All rugs have non-skid backing?: yes All stairs or steps have railings?: yes Grab bars in the bathtub or shower?: yes Have non-skid surface in bathtub or shower?: yes Good home lighting?: yes Regular seat belt use?: yes Hospital stays in the last year:: no  Cognitive Assessment Difficulty concentrating, remembering, or making decisions? : no Will 6CIT or Mini Cog be Completed: no 6CIT or Mini Cog Declined: patient alert, oriented, able to answer questions appropriately and recall recent events  Advance Directives (For Healthcare) Does Patient Have a Medical Advance Directive?: No Does patient want to make changes to medical advance directive?: No - Patient declined Type of Advance Directive: Healthcare Power of [place]; Living will Copy of Living Will in Chart?: No - copy requested Living Will Requested and Now in Chart: -- (patient does not have a living will at this time) Would patient like information on creating a medical advance directive?: Yes (MAU/Ambulatory/Procedural Areas - Information given)  Reviewed/Updated  Reviewed/Updated: Reviewed All (Medical, Surgical, Family, Medications, Allergies, Care Teams, Patient Goals)    Allergies (verified) Bactrim  [sulfamethoxazole -trimethoprim ], Doxycycline  calcium , Latex, Allopurinol , and Amoxicillin   Current Medications (verified) Outpatient Encounter Medications as of 04/15/2024  Medication Sig   acyclovir  (ZOVIRAX ) 400 MG tablet Take 1 tablet by mouth twice daily   amLODipine  (NORVASC ) 5 MG tablet Take 1 tablet (5 mg total) by mouth daily.   cetirizine (ZYRTEC) 10 MG  tablet Take 10 mg by mouth daily.   fluconazole  (DIFLUCAN ) 200 MG tablet Take 1 tablet (200 mg total) by mouth  daily.   fluticasone  (CUTIVATE ) 0.05 % cream Apply bid prn to rash   fluticasone  (FLONASE ) 50 MCG/ACT nasal spray Place 2 sprays into both nostrils daily.   hydrochlorothiazide  (HYDRODIURIL ) 25 MG tablet Take 1 tablet (25 mg total) by mouth daily.   ondansetron  (ZOFRAN ) 8 MG tablet Take 1 tablet (8 mg total) by mouth every 8 (eight) hours as needed for nausea.   pirtobrutinib  (JAYPIRCA ) 100 MG tablet Take 2 tablets (200 mg total) by mouth daily.   Prenatal Vit-Fe Fumarate-FA (PRENATAL MULTIVITAMIN) TABS tablet Take 1 tablet by mouth daily at 12 noon.   rosuvastatin  (CRESTOR ) 5 MG tablet Take 1 tablet (5 mg total) by mouth at bedtime.   No facility-administered encounter medications on file as of 04/15/2024.    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 Removed May 2023   COLONOSCOPY N/A 04/01/2019   Procedure: COLONOSCOPY;  Surgeon: Harvey Margo CROME, MD;  Location: [facility] ENDO SUITE;  Service: Endoscopy;  Laterality: N/A;  12:45pm   ESOPHAGOGASTRODUODENOSCOPY N/A 04/01/2019   Procedure: ESOPHAGOGASTRODUODENOSCOPY (EGD);  Surgeon: Harvey Margo CROME, MD;  Location: [facility];  Service: Endoscopy;  Laterality: N/A;    EYE SURGERY  February 2023   Laser vision correction on my left eye only   FINGER SURGERY     SHUNT REMOVAL N/A 09/13/2021   Procedure: SHUNT REMOVAL;  Surgeon: Gillie Duncans, MD;  Location: [facility] OR;  Service: Neurosurgery;  Laterality: N/A;  RM 19   VENTRICULOPERITONEAL SHUNT Right 02/18/2019   Procedure: SHUNT INSERTION VENTRICULAR-PERITONEAL;  Surgeon: Gillie Duncans, MD;  Location: [facility] OR;  Service: Neurosurgery;  Laterality: Right;  SHUNT INSERTION VENTRICULAR-PERITONEAL   Family History  Problem Relation Age of Onset   Colon cancer Father 48       passed from [facility]   Cancer Father    Colon cancer Paternal Uncle 30       passed from [facility]   Social History   Occupational History   Not on file  Tobacco Use   Smoking status: Never   Smokeless tobacco: Never  Vaping Use   Vaping status: Never Used  Substance and Sexual Activity   Alcohol use: Not Currently   Drug use: Never   Sexual activity: Yes    Birth control/protection: Condom   Tobacco Counseling Counseling given: Not Answered  SDOH Screenings   Food Insecurity: No Food Insecurity (04/15/2024)  Housing: Low Risk  (04/15/2024)  Transportation Needs: No Transportation Needs (04/15/2024)  Utilities: Not At Risk (04/15/2024)  Alcohol Screen: Low Risk  (03/30/2023)  Depression (PHQ2-9): Low Risk  (04/15/2024)  Financial Resource Strain: Low Risk  (03/01/2024)  Physical Activity: Sufficiently Active (04/15/2024)  Social Connections: Moderately Integrated (04/15/2024)  Stress: No Stress Concern  Present (04/15/2024)  Tobacco Use: Low Risk  (04/15/2024)  Health Literacy: Adequate Health Literacy (04/15/2024)   See flowsheets for full screening details  Depression Screen PHQ 2 & 9 Depression Scale- Over the past 2 weeks, how often have you been bothered by any of the following problems? Little interest or pleasure in doing things: 0 Feeling down, depressed, or hopeless (PHQ Adolescent also includes...irritable): 0 PHQ-2 Total Score:  0 Trouble falling or staying asleep, or sleeping too much: 0 Feeling tired or having little energy: 1 Poor appetite or overeating (PHQ Adolescent also includes...weight loss): 0 Feeling bad about yourself - or that you are a failure or have let yourself or your family down: 0 Trouble concentrating on things, such as reading the newspaper or watching television (PHQ Adolescent also includes...like school work): 0 Moving or speaking so slowly that other people could have noticed. Or the opposite - being so fidgety or restless that you have been moving around a lot more than usual: 0 Thoughts that you would be better off dead, or of hurting yourself in some way: 0 PHQ-9 Total Score: 1 If you checked off any problems, how difficult have these problems made it for you to do your work, take care of things at home, or get along with other people?: Not difficult at all     Goals Addressed             This Visit's Progress    Maintain health and independence   On track            Objective:    Today's Vitals   04/15/24 1323  Weight: 249 lb (112.9 kg)  Height: 5' 10 (1.778 m)   Body mass index is 35.73 kg/m.  Hearing/Vision screen Hearing Screening - Comments:: Patient is able to hear conversational tones without difficulty. No issues reported.   Vision Screening - Comments:: Wears rx glasses - up to date with routine eye exams with [facility] [place] Immunizations and Health Maintenance Health Maintenance  Topic Date Due   DTaP/Tdap/Td (1 - Tdap) Never done   Pneumococcal Vaccine: 50+ Years (1 of 2 - PCV) Never done   Hepatitis B Vaccines 19-59 Average Risk (1 of 3 - 19+ 3-dose series) Never done   Zoster Vaccines- Shingrix (1 of 2) Never done   COVID-19 Vaccine (3 - Moderna risk series) 08/31/2019   Colonoscopy  03/31/2024   Medicare Annual Wellness (AWV)  04/15/2025   Influenza Vaccine  Completed   Hepatitis C Screening  Completed   HIV Screening  Completed   HPV  VACCINES  Aged Out   Meningococcal B Vaccine  Aged Out        Assessment/Plan:  This is a routine wellness examination for David Whitehead.  Patient Care Team: Alphonsa Glendia LABOR, MD as PCP - General (Family Medicine) Randine Warren BRAVO, RN as Oncology Nurse Navigator Ennever, Maude SAUNDERS, MD as Medical Oncologist (Oncology) [facility] Fleeta Rothman, Jomarie SAILOR, MD as Consulting Physician (Infectious Diseases) [facility], [facility]   I have personally reviewed and noted the following in the patient's chart:   Medical and social history Use of alcohol, tobacco or illicit drugs  Current medications and supplements including opioid prescriptions. Functional ability and status Nutritional status Physical activity Advanced directives List of other physicians Hospitalizations, surgeries, and ER visits in previous 12 months Vitals Screenings to include cognitive, depression, and falls Referrals and appointments  No orders of the defined types were placed  in this encounter.  In addition, I have reviewed and discussed with patient certain preventive protocols, quality metrics, and best practice recommendations. A written personalized care plan for preventive services as well as general preventive health recommendations were provided to patient.   David Charmaine Browner, LPN   [id number]   Return in 1 year (on 04/15/2025).  After Visit Summary: (MyChart) Due to this being a telephonic visit, the after visit summary with patients personalized plan was offered to patient via MyChart   Nurse Notes: Patient would like to discuss colon screening with pcp prior to orders being placed; is ok with returning to [facility] for next colonoscopy

## 2024-04-19 ENCOUNTER — Telehealth: Payer: Self-pay

## 2024-04-19 ENCOUNTER — Other Ambulatory Visit: Payer: Self-pay

## 2024-04-19 ENCOUNTER — Inpatient Hospital Stay: Admitting: Hematology & Oncology

## 2024-04-19 ENCOUNTER — Encounter: Payer: Self-pay | Admitting: Hematology & Oncology

## 2024-04-19 ENCOUNTER — Inpatient Hospital Stay: Attending: Hematology & Oncology

## 2024-04-19 VITALS — BP 99/58 | HR 76 | Temp 97.9°F | Resp 98 | Ht 70.0 in | Wt 248.0 lb

## 2024-04-19 DIAGNOSIS — C9110 Chronic lymphocytic leukemia of B-cell type not having achieved remission: Secondary | ICD-10-CM | POA: Diagnosis present

## 2024-04-19 DIAGNOSIS — B451 Cerebral cryptococcosis: Secondary | ICD-10-CM | POA: Insufficient documentation

## 2024-04-19 LAB — CBC WITH DIFFERENTIAL (CANCER CENTER ONLY)
Abs Immature Granulocytes: 0.14 K/uL — ABNORMAL HIGH (ref 0.00–0.07)
Basophils Absolute: 0.1 K/uL (ref 0.0–0.1)
Basophils Relative: 0 %
Eosinophils Absolute: 0.5 K/uL (ref 0.0–0.5)
Eosinophils Relative: 0 %
HCT: 47.9 % (ref 39.0–52.0)
Hemoglobin: 14.9 g/dL (ref 13.0–17.0)
Immature Granulocytes: 0 %
Lymphocytes Relative: 97 %
Lymphs Abs: 136.1 K/uL — ABNORMAL HIGH (ref 0.7–4.0)
MCH: 27.2 pg (ref 26.0–34.0)
MCHC: 31.1 g/dL (ref 30.0–36.0)
MCV: 87.6 fL (ref 80.0–100.0)
Monocytes Absolute: 0.9 K/uL (ref 0.1–1.0)
Monocytes Relative: 1 %
Neutro Abs: 3.3 K/uL (ref 1.7–7.7)
Neutrophils Relative %: 2 %
Platelet Count: 192 K/uL (ref 150–400)
RBC: 5.47 MIL/uL (ref 4.22–5.81)
RDW: 13.1 % (ref 11.5–15.5)
Smear Review: NORMAL
WBC Count: 141 K/uL (ref 4.0–10.5)
nRBC: 0 % (ref 0.0–0.2)

## 2024-04-19 LAB — CMP (CANCER CENTER ONLY)
ALT: 60 U/L — ABNORMAL HIGH (ref 0–44)
AST: 35 U/L (ref 15–41)
Albumin: 4.9 g/dL (ref 3.5–5.0)
Alkaline Phosphatase: 76 U/L (ref 38–126)
Anion gap: 10 (ref 5–15)
BUN: 14 mg/dL (ref 6–20)
CO2: 29 mmol/L (ref 22–32)
Calcium: 9.9 mg/dL (ref 8.9–10.3)
Chloride: 103 mmol/L (ref 98–111)
Creatinine: 1.02 mg/dL (ref 0.61–1.24)
GFR, Estimated: >60 mL/min (ref >60)
Glucose, Bld: 140 mg/dL — ABNORMAL HIGH (ref 70–99)
Potassium: 4.4 mmol/L (ref 3.5–5.1)
Sodium: 142 mmol/L (ref 135–145)
Total Bilirubin: 0.4 mg/dL (ref 0.0–1.2)
Total Protein: 6.5 g/dL (ref 6.5–8.1)

## 2024-04-19 LAB — SAVE SMEAR(SSMR), FOR PROVIDER SLIDE REVIEW

## 2024-04-19 LAB — LACTATE DEHYDROGENASE: LDH: 187 U/L (ref 105–235)

## 2024-04-19 NOTE — Progress Notes (Signed)
 Hematology and Oncology Follow Up Visit  David Whitehead [id number] [birth year] 53 y.o. 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  He now is on Jaypirca .  He is doing pretty well with this.  As expected, his white cell count has been up quite a bit.  He has had no problems with fever.  He says that the lymph nodes on his neck may be a little bit smaller..  He has had no change in bowel or bladder habits.  He has had no rashes.  There is been no bleeding.  His wife is now retired.  He is not working.  As such, they can enjoy life together.  They just had the 20th wedding anniversary.  So happy for them.  He has had no problems with headache.  He has had no mouth sores.  Overall, I will say that his performance status is probably ECOG 0.    Medications:  Allergies as of 04/19/2024       Reactions   Bactrim  [sulfamethoxazole -trimethoprim ] Rash   Doxycycline  Calcium  Itching   felt like pins and needles were in face after one dose.   Latex Other (See Comments)   Took skin off when removed tape   Allopurinol  Itching, Rash   Amoxicillin Rash   Has patient had a PCN reaction causing immediate rash, facial/tongue/throat swelling, SOB or lightheadedness with hypotension: No Has patient had a PCN reaction causing severe rash involving mucus membranes or skin necrosis: No Has patient had a PCN reaction that required hospitalization: No Has patient had a PCN reaction occurring within the last 10 years: No If all of the above answers are NO, then may proceed with Cephalosporin use.        Medication List        Accurate as of April 19, 2024 12:42  PM. If you have any questions, ask your nurse or doctor.          acyclovir  400 MG tablet Commonly known as: ZOVIRAX  Take 1 tablet by mouth twice daily   amLODipine  5 MG tablet Commonly known as: NORVASC  Take 1 tablet (5 mg total) by mouth daily.   cetirizine 10 MG tablet Commonly known as: ZYRTEC Take 10 mg by mouth daily.   fluconazole  200 MG tablet Commonly known as: DIFLUCAN  Take 1 tablet (200 mg total) by mouth daily.   fluticasone  0.05 % cream Commonly known as: CUTIVATE  Apply bid prn to rash   fluticasone  50 MCG/ACT nasal spray Commonly known as: FLONASE  Place 2 sprays into both nostrils daily.   hydrochlorothiazide  25 MG tablet Commonly known as: HYDRODIURIL  Take 1 tablet (25 mg total) by mouth daily.   Jaypirca  100 MG tablet Generic drug: pirtobrutinib  Take 2 tablets (200 mg total) by mouth daily.   ondansetron  8 MG tablet Commonly known as: Zofran  Take 1 tablet (8 mg total) by mouth every 8 (eight) hours as needed for nausea.   prenatal multivitamin Tabs tablet Take 1 tablet by mouth daily at 12 noon.   rosuvastatin  5 MG tablet Commonly known as: Crestor  Take 1 tablet (5 mg total) by mouth at  bedtime.        Allergies:  Allergies  Allergen Reactions   Bactrim  [Sulfamethoxazole -Trimethoprim ] Rash   Doxycycline  Calcium  Itching    felt like pins and needles were in face after one dose.   Latex Other (See Comments)    Took skin off when removed tape   Allopurinol  Itching and Rash   Amoxicillin Rash    Has patient had a PCN reaction causing immediate rash, facial/tongue/throat swelling, SOB or lightheadedness with hypotension: No Has patient had a PCN reaction causing severe rash involving mucus membranes or skin necrosis: No Has patient had a PCN reaction that required hospitalization: No Has patient had a PCN reaction occurring within the last 10 years: No If all of the above answers are NO, then may proceed with Cephalosporin use.      Past Medical History, Surgical history, Social history, and Family History were reviewed and updated.  Review of Systems: Review of Systems  Constitutional: Negative.   HENT: Negative.    Eyes: Negative.   Respiratory: Negative.    Cardiovascular: Negative.   Gastrointestinal: Negative.   Genitourinary: Negative.   Musculoskeletal: Negative.  Negative for myalgias.  Skin: Negative.   Neurological: Negative.   Endo/Heme/Allergies: Negative.   Psychiatric/Behavioral: Negative.       Physical Exam:  Vital signs show temperature of 98.  Pulse 76.  Blood pressure 99/58.  Weight is 248 pounds.    Wt Readings from Last 3 Encounters:  04/19/24 248 lb (112.5 kg)  04/15/24 249 lb (112.9 kg)  03/10/24 249 lb (112.9 kg)    Physical Exam Vitals reviewed.  Constitutional:      Comments: I cannot palpate any lymph node in both axilla.    HENT:     Head: Normocephalic and atraumatic.  Eyes:     Pupils: Pupils are equal, round, and reactive to light.  Cardiovascular:     Rate and Rhythm: Normal rate and regular rhythm.     Heart sounds: Normal heart sounds.  Pulmonary:     Effort: Pulmonary effort is normal.     Breath sounds: Normal breath sounds.  Abdominal:     General: Bowel sounds are normal.     Palpations: Abdomen is soft.  Musculoskeletal:        General: No tenderness or deformity. Normal range of motion.     Cervical back: Normal range of motion.  Lymphadenopathy:     Cervical: No cervical adenopathy.  Skin:    General: Skin is warm and dry.     Findings: No erythema or rash.  Neurological:     Mental Status: He is alert and oriented to person, place, and time.  Psychiatric:        Behavior: Behavior normal.        Thought Content: Thought content normal.        Judgment: Judgment normal.     Lab Results  Component Value Date   WBC 141.0 (HH) 04/19/2024   HGB 14.9 04/19/2024   HCT 47.9 04/19/2024   MCV 87.6 04/19/2024   PLT 192 04/19/2024   Lab  Results  Component Value Date   FERRITIN 42 11/02/2019   IRON  48 11/02/2019   TIBC 329 11/02/2019   UIBC 280 11/02/2019   IRONPCTSAT 15 (L) 11/02/2019   Lab Results  Component Value Date   RBC 5.47 04/19/2024   Lab Results  Component Value Date   KPAFRELGTCHN 89.1 (H) 03/10/2024   LAMBDASER 4.0 (L) 03/10/2024   KAPLAMBRATIO 22.28 (H)  03/10/2024   Lab Results  Component Value Date   IGGSERUM 496 (L) 03/10/2024   IGA 7 (L) 03/10/2024   IGMSERUM <5 (L) 03/10/2024   Lab Results  Component Value Date   TOTALPROTELP 6.5 03/10/2024   ALBUMINELP 4.2 03/10/2024   A1GS 0.2 03/10/2024   A2GS 0.6 03/10/2024   BETS 1.1 03/10/2024   GAMS 0.4 03/10/2024   MSPIKE Not Observed 03/10/2024   SPEI Comment 03/23/2023     Chemistry      Component Value Date/Time   NA 142 04/19/2024 1134   NA 139 06/05/2021 0950   K 4.4 04/19/2024 1134   CL 103 04/19/2024 1134   CO2 29 04/19/2024 1134   BUN 14 04/19/2024 1134   BUN 18 06/05/2021 0950   CREATININE 1.02 04/19/2024 1134   CREATININE 1.07 02/26/2021 1130      Component Value Date/Time   CALCIUM  9.9 04/19/2024 1134   ALKPHOS 76 04/19/2024 1134   AST 35 04/19/2024 1134   ALT 60 (H) 04/19/2024 1134   BILITOT 0.4 04/19/2024 1134     I looked at his peripheral blood smear.   He has marked lymphocytosis.  He has smudge cells.  I do not see any nucleated red blood cells.  Platelets are adequate in number and size.  Impression and Plan: Mr. Reichenberger is a very pleasant 53 yo gentleman with an aggressive CLL by virtue of the cytogenetics.   Unfortunately, he has recurrence now.  Again, I really think that the Jaypirca  should be able to get him into remission.  I am not surprised that the white cell count is on the high side.  I suspect it may well be minimal bit more before starts to come back down.  We will plan to get him back now in about 6 weeks or so.  I do not see that have to do any scans on him.  Hopefully, we will start to see  his white cell count began to normalize.   Maude JONELLE Crease, MD 12/9/202512:42 PM

## 2024-04-19 NOTE — Telephone Encounter (Signed)
 Received a call from [facility] in ED lab stating pt has a critical WBC count of 141. Dr Timmy notified and states he will f/u with him in clinic today.

## 2024-04-26 ENCOUNTER — Other Ambulatory Visit: Payer: Self-pay

## 2024-04-28 ENCOUNTER — Encounter (HOSPITAL_COMMUNITY): Payer: Self-pay

## 2024-04-28 ENCOUNTER — Encounter: Payer: Self-pay | Admitting: Hematology

## 2024-04-28 ENCOUNTER — Other Ambulatory Visit (HOSPITAL_COMMUNITY): Payer: Self-pay

## 2024-04-28 ENCOUNTER — Other Ambulatory Visit: Payer: Self-pay

## 2024-04-30 ENCOUNTER — Ambulatory Visit: Payer: Self-pay | Admitting: Family Medicine

## 2024-04-30 ENCOUNTER — Telehealth: Payer: Self-pay | Admitting: Family Medicine

## 2024-04-30 LAB — LIPID PANEL
Chol/HDL Ratio: 5.1 ratio — ABNORMAL HIGH (ref 0.0–5.0)
Cholesterol, Total: 168 mg/dL (ref 100–199)
HDL: 33 mg/dL — ABNORMAL LOW
LDL Chol Calc (NIH): 98 mg/dL (ref 0–99)
Triglycerides: 216 mg/dL — ABNORMAL HIGH (ref 0–149)
VLDL Cholesterol Cal: 37 mg/dL (ref 5–40)

## 2024-04-30 LAB — BASIC METABOLIC PANEL WITH GFR
BUN/Creatinine Ratio: 13 (ref 9–20)
BUN: 15 mg/dL (ref 6–24)
CO2: 26 mmol/L (ref 20–29)
Calcium: 9.8 mg/dL (ref 8.7–10.2)
Chloride: 101 mmol/L (ref 96–106)
Creatinine, Ser: 1.19 mg/dL (ref 0.76–1.27)
Glucose: 155 mg/dL — ABNORMAL HIGH (ref 70–99)
Potassium: 4.2 mmol/L (ref 3.5–5.2)
Sodium: 141 mmol/L (ref 134–144)
eGFR: 74 mL/min/1.73

## 2024-04-30 LAB — MICROALBUMIN / CREATININE URINE RATIO
Creatinine, Urine: 216.4 mg/dL
Microalb/Creat Ratio: 5 mg/g{creat} (ref 0–29)
Microalbumin, Urine: 10.5 ug/mL

## 2024-04-30 LAB — PSA: Prostate Specific Ag, Serum: 1.3 ng/mL (ref 0.0–4.0)

## 2024-04-30 NOTE — Telephone Encounter (Signed)
 Patient's wife called Patient not feeling well with respiratory symptoms upper respiratory.  Has underlying viral illness COVID test positive On active treatment for lymphoma.  I spoke with patient by phone today.  He started on Thursday with respiratory symptoms of upper airway sneezing then it progressed to head congestion no chest congestion no wheezing no shortness of breath no nausea vomiting or diarrhea no high fevers.  COVID test was positive.  Paxlovid  is a medication that could potentially prevent more serious complications but it also interacts with his lymphoma therapy.  Given that he is feeling somewhat better today and not having any complicating factors I think it is reasonable to hold off on this he will give us  updates over the next couple days how he is doing he will reach out to us  if having any ongoing troubles or problems

## 2024-05-02 ENCOUNTER — Other Ambulatory Visit: Payer: Self-pay

## 2024-05-03 ENCOUNTER — Encounter (INDEPENDENT_AMBULATORY_CARE_PROVIDER_SITE_OTHER): Payer: Self-pay

## 2024-05-03 ENCOUNTER — Other Ambulatory Visit: Payer: Self-pay

## 2024-05-04 ENCOUNTER — Other Ambulatory Visit: Payer: Self-pay

## 2024-05-04 NOTE — Progress Notes (Signed)
 Specialty Pharmacy Refill Coordination Note  David Whitehead is a 53 y.o. male contacted today regarding refills of specialty medication(s) Pirtobrutinib  (JAYPIRCA )   Patient requested Delivery   Delivery date: 05/10/24   Verified address: [address] [place], [place], [address]   Medication will be filled on: 05/09/24

## 2024-05-08 ENCOUNTER — Other Ambulatory Visit: Payer: Self-pay | Admitting: Hematology & Oncology

## 2024-05-08 DIAGNOSIS — C9110 Chronic lymphocytic leukemia of B-cell type not having achieved remission: Secondary | ICD-10-CM

## 2024-05-09 ENCOUNTER — Other Ambulatory Visit: Payer: Self-pay

## 2024-05-09 ENCOUNTER — Encounter: Payer: Self-pay | Admitting: Family Medicine

## 2024-05-09 ENCOUNTER — Encounter: Payer: Self-pay | Admitting: Hematology

## 2024-05-10 ENCOUNTER — Other Ambulatory Visit: Payer: Self-pay | Admitting: Family Medicine

## 2024-05-10 ENCOUNTER — Encounter: Admitting: Family Medicine

## 2024-05-10 ENCOUNTER — Ambulatory Visit: Admitting: Family Medicine

## 2024-05-10 ENCOUNTER — Encounter: Payer: Self-pay | Admitting: Family Medicine

## 2024-05-10 VITALS — BP 126/89 | HR 101 | Temp 98.4°F | Ht 70.0 in | Wt 246.0 lb

## 2024-05-10 DIAGNOSIS — J019 Acute sinusitis, unspecified: Secondary | ICD-10-CM

## 2024-05-10 DIAGNOSIS — I10 Essential (primary) hypertension: Secondary | ICD-10-CM

## 2024-05-10 DIAGNOSIS — E782 Mixed hyperlipidemia: Secondary | ICD-10-CM | POA: Diagnosis not present

## 2024-05-10 DIAGNOSIS — Z79899 Other long term (current) drug therapy: Secondary | ICD-10-CM | POA: Diagnosis not present

## 2024-05-10 DIAGNOSIS — R739 Hyperglycemia, unspecified: Secondary | ICD-10-CM

## 2024-05-10 MED ORDER — AZITHROMYCIN 250 MG PO TABS: ORAL_TABLET | ORAL | 0 refills | Status: AC

## 2024-05-10 MED ORDER — ROSUVASTATIN CALCIUM 10 MG PO TABS: 10.0000 mg | ORAL_TABLET | Freq: Every day | ORAL | 2 refills | Status: AC

## 2024-05-10 NOTE — Progress Notes (Unsigned)
" ° °  Subjective:    Patient ID: David Whitehead, male    DOB: 1970-06-06, 53 y.o.   MRN: [id number]  HPI Covid positive last week now testing negative Severe sinus congestion, sinus headaches- yellow green mucus Patient started off COVID last week He is starting to do better His test findings are negative He relates a lot of sinus pressure pain discomfort with discolored mucus  Review of Systems     Objective:   Physical Exam  Gen-NAD not toxic TMS-normal bilateral T- normal no redness Chest-CTA respiratory rate normal no crackles CV RRR no murmur Skin-warm dry Neuro-grossly normal No sign of pneumonia Patient nontoxic      Assessment & Plan:  Acute rhinosinusitis Antibiotics prescribed Patient to give us  feedback if not getting better over the next 7 to 10 days Patient does have history of hyperlipidemia we need to do a follow-up lipid profile to make sure LDL below 70 and also check A1c because of hyperglycemia  "

## 2024-06-01 ENCOUNTER — Other Ambulatory Visit: Payer: Self-pay

## 2024-06-02 ENCOUNTER — Other Ambulatory Visit: Payer: Self-pay

## 2024-06-02 NOTE — Progress Notes (Signed)
 Specialty Pharmacy Refill Coordination Note  David Whitehead is a 54 y.o. male contacted today regarding refills of specialty medication(s) Pirtobrutinib  (JAYPIRCA )   Patient requested (Patient-Rptd) Delivery   Delivery date: 06/09/24   Verified address: (Patient-Rptd) [address], [place], [place] [address]   Medication will be filled on: 06/08/24

## 2024-06-07 ENCOUNTER — Other Ambulatory Visit: Payer: Self-pay | Admitting: Hematology & Oncology

## 2024-06-07 DIAGNOSIS — C9110 Chronic lymphocytic leukemia of B-cell type not having achieved remission: Secondary | ICD-10-CM

## 2024-06-08 ENCOUNTER — Other Ambulatory Visit: Payer: Self-pay

## 2024-06-16 ENCOUNTER — Other Ambulatory Visit: Payer: Self-pay | Admitting: Pharmacist

## 2024-06-16 ENCOUNTER — Other Ambulatory Visit: Payer: Self-pay

## 2024-06-16 NOTE — Progress Notes (Signed)
 Specialty Pharmacy Ongoing Clinical Assessment Note  David Whitehead is a 54 y.o. male who is being followed by the specialty pharmacy service for RxSp Oncology   Patient's specialty medication(s) reviewed today: Pirtobrutinib  (JAYPIRCA )   Missed doses in the last 4 weeks: 0   Patient/Caregiver did not have any additional questions or concerns.   Therapeutic benefit summary: Patient is achieving benefit   Adverse events/side effects summary: No adverse events/side effects   Patient's therapy is appropriate to: Continue    Goals Addressed             This Visit's Progress    Slow Disease Progression   On track    Patient is initiating therapy. Patient will maintain adherence         Follow up: 3 months  Lyle LELON Chalk Specialty Pharmacist

## 2024-06-17 ENCOUNTER — Inpatient Hospital Stay: Admitting: Hematology & Oncology

## 2024-06-17 ENCOUNTER — Telehealth: Payer: Self-pay

## 2024-06-17 ENCOUNTER — Other Ambulatory Visit: Payer: Self-pay

## 2024-06-17 ENCOUNTER — Inpatient Hospital Stay

## 2024-06-17 ENCOUNTER — Encounter: Payer: Self-pay | Admitting: Hematology & Oncology

## 2024-06-17 VITALS — BP 129/92 | HR 70 | Temp 97.7°F | Resp 16 | Ht 70.0 in | Wt 249.0 lb

## 2024-06-17 DIAGNOSIS — C9110 Chronic lymphocytic leukemia of B-cell type not having achieved remission: Secondary | ICD-10-CM

## 2024-06-17 LAB — CBC WITH DIFFERENTIAL (CANCER CENTER ONLY)
Abs Immature Granulocytes: 0.08 10*3/uL — ABNORMAL HIGH (ref 0.00–0.07)
Basophils Absolute: 0.1 10*3/uL (ref 0.0–0.1)
Basophils Relative: 0 %
Eosinophils Absolute: 0.4 10*3/uL (ref 0.0–0.5)
Eosinophils Relative: 1 %
HCT: 46.8 % (ref 39.0–52.0)
Hemoglobin: 15 g/dL (ref 13.0–17.0)
Immature Granulocytes: 0 %
Lymphocytes Relative: 93 %
Lymphs Abs: 70.1 10*3/uL — ABNORMAL HIGH (ref 0.7–4.0)
MCH: 27.7 pg (ref 26.0–34.0)
MCHC: 32.1 g/dL (ref 30.0–36.0)
MCV: 86.3 fL (ref 80.0–100.0)
Monocytes Absolute: 0.7 10*3/uL (ref 0.1–1.0)
Monocytes Relative: 1 %
Neutro Abs: 3.6 10*3/uL (ref 1.7–7.7)
Neutrophils Relative %: 5 %
Platelet Count: 194 10*3/uL (ref 150–400)
RBC: 5.42 MIL/uL (ref 4.22–5.81)
RDW: 13 % (ref 11.5–15.5)
Smear Review: NORMAL
WBC Count: 74.9 10*3/uL (ref 4.0–10.5)
nRBC: 0 % (ref 0.0–0.2)

## 2024-06-17 LAB — CMP (CANCER CENTER ONLY)
ALT: 45 U/L — ABNORMAL HIGH (ref 0–44)
AST: 27 U/L (ref 15–41)
Albumin: 4.7 g/dL (ref 3.5–5.0)
Alkaline Phosphatase: 67 U/L (ref 38–126)
Anion gap: 11 (ref 5–15)
BUN: 11 mg/dL (ref 6–20)
CO2: 28 mmol/L (ref 22–32)
Calcium: 9.4 mg/dL (ref 8.9–10.3)
Chloride: 103 mmol/L (ref 98–111)
Creatinine: 0.95 mg/dL (ref 0.61–1.24)
GFR, Estimated: >60 mL/min
Glucose, Bld: 130 mg/dL — ABNORMAL HIGH (ref 70–99)
Potassium: 4.3 mmol/L (ref 3.5–5.1)
Sodium: 142 mmol/L (ref 135–145)
Total Bilirubin: 0.4 mg/dL (ref 0.0–1.2)
Total Protein: 6.5 g/dL (ref 6.5–8.1)

## 2024-06-17 LAB — LACTATE DEHYDROGENASE: LDH: 185 U/L (ref 105–235)

## 2024-06-17 NOTE — Telephone Encounter (Signed)
 Received a call from [facility] in ED lab stating pt had a critical WBC count of 74.9. Dr Timmy made aware and stated that was better from previous result of 141.

## 2024-06-17 NOTE — Progress Notes (Signed)
 " Hematology and Oncology Follow Up Visit  David Whitehead [id number] Jun 22, 1970 54 y.o. 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  We last saw back in December.  Since then, he has been doing quite nicely.  He is retired now.  Both he and his wife are retired.  I know that they will enjoy retirement.  He is on Jaypirca .  He has had no problems with Jaypirca .  He has had no problem with fever.  He has had no nausea or vomiting.  Has had no diarrhea.  There has been no rashes.  He has had no leg swelling.  He is staying quite active.  He was busy plowing their neighborhood when we had a bad snow last weekend.  Overall, I would have to say that his performance status is probably ECOG 0.    We medications:  Allergies as of 06/17/2024       Reactions   Bactrim  [sulfamethoxazole -trimethoprim ] Rash   Doxycycline  Calcium  Itching   felt like pins and needles were in face after one dose.   Latex Other (See Comments)   Took skin off when removed tape   Allopurinol  Itching, Rash   Amoxicillin Rash   Has patient had a PCN reaction causing immediate rash, facial/tongue/throat swelling, SOB or lightheadedness with hypotension: No Has patient had a PCN reaction causing severe rash involving mucus membranes or skin necrosis: No Has patient had a PCN reaction that required hospitalization: No Has patient had a PCN reaction occurring within the last 10 years: No If all of the above answers are NO, then may proceed with Cephalosporin use.        Medication List        Accurate as of June 17, 2024 12:10 PM. If you have any questions, ask your nurse or  doctor.          acyclovir  400 MG tablet Commonly known as: ZOVIRAX  Take 1 tablet by mouth twice daily   amLODipine  5 MG tablet Commonly known as: NORVASC  Take 1 tablet by mouth once daily   cetirizine 10 MG tablet Commonly known as: ZYRTEC Take 10 mg by mouth daily.   fluconazole  200 MG tablet Commonly known as: DIFLUCAN  Take 1 tablet (200 mg total) by mouth daily.   fluticasone  0.05 % cream Commonly known as: CUTIVATE  Apply bid prn to rash   fluticasone  50 MCG/ACT nasal spray Commonly known as: FLONASE  Place 2 sprays into both nostrils daily.   hydrochlorothiazide  25 MG tablet Commonly known as: HYDRODIURIL  Take 1 tablet (25 mg total) by mouth daily.   Jaypirca  100 MG tablet Generic drug: pirtobrutinib  Take 2 tablets (200 mg total) by mouth daily.   ondansetron  8 MG tablet Commonly known as: Zofran  Take 1 tablet (8 mg total) by mouth every 8 (eight) hours as needed for nausea.   prenatal multivitamin Tabs tablet Take 1 tablet by mouth daily at 12 noon.   rosuvastatin  10 MG tablet Commonly known as: Crestor  Take 1 tablet (10 mg total) by mouth at bedtime.        Allergies:  Allergies  Allergen Reactions  Bactrim  [Sulfamethoxazole -Trimethoprim ] Rash   Doxycycline  Calcium  Itching    felt like pins and needles were in face after one dose.   Latex Other (See Comments)    Took skin off when removed tape   Allopurinol  Itching and Rash   Amoxicillin Rash    Has patient had a PCN reaction causing immediate rash, facial/tongue/throat swelling, SOB or lightheadedness with hypotension: No Has patient had a PCN reaction causing severe rash involving mucus membranes or skin necrosis: No Has patient had a PCN reaction that required hospitalization: No Has patient had a PCN reaction occurring within the last 10 years: No If all of the above answers are NO, then may proceed with Cephalosporin use.     Past Medical History, Surgical history, Social history,  and Family History were reviewed and updated.  Review of Systems: Review of Systems  Constitutional: Negative.   HENT: Negative.    Eyes: Negative.   Respiratory: Negative.    Cardiovascular: Negative.   Gastrointestinal: Negative.   Genitourinary: Negative.   Musculoskeletal: Negative.  Negative for myalgias.  Skin: Negative.   Neurological: Negative.   Endo/Heme/Allergies: Negative.   Psychiatric/Behavioral: Negative.       Physical Exam:  Vital signs show temperature of 98.  Pulse 76.  Blood pressure 99/58.  Weight is 249 pounds.    Wt Readings from Last 3 Encounters:  06/17/24 249 lb (112.9 kg)  05/10/24 246 lb (111.6 kg)  04/19/24 248 lb (112.5 kg)    Physical Exam Vitals reviewed.  Constitutional:      Comments: I cannot palpate any lymph node in both axilla.    HENT:     Head: Normocephalic and atraumatic.  Eyes:     Pupils: Pupils are equal, round, and reactive to light.  Cardiovascular:     Rate and Rhythm: Normal rate and regular rhythm.     Heart sounds: Normal heart sounds.  Pulmonary:     Effort: Pulmonary effort is normal.     Breath sounds: Normal breath sounds.  Abdominal:     General: Bowel sounds are normal.     Palpations: Abdomen is soft.  Musculoskeletal:        General: No tenderness or deformity. Normal range of motion.     Cervical back: Normal range of motion.  Lymphadenopathy:     Cervical: No cervical adenopathy.  Skin:    General: Skin is warm and dry.     Findings: No erythema or rash.  Neurological:     Mental Status: He is alert and oriented to person, place, and time.  Psychiatric:        Behavior: Behavior normal.        Thought Content: Thought content normal.        Judgment: Judgment normal.     Lab Results  Component Value Date   WBC 141.0 (HH) 04/19/2024   HGB 14.9 04/19/2024   HCT 47.9 04/19/2024   MCV 87.6 04/19/2024   PLT 192 04/19/2024   Lab Results  Component Value Date   FERRITIN 42 11/02/2019   IRON   48 11/02/2019   TIBC 329 11/02/2019   UIBC 280 11/02/2019   IRONPCTSAT 15 (L) 11/02/2019   Lab Results  Component Value Date   RBC 5.47 04/19/2024   Lab Results  Component Value Date   KPAFRELGTCHN 89.1 (H) 03/10/2024   LAMBDASER 4.0 (L) 03/10/2024   KAPLAMBRATIO 22.28 (H) 03/10/2024   Lab Results  Component Value Date   IGGSERUM 496 (L) 03/10/2024  IGA 7 (L) 03/10/2024   IGMSERUM <5 (L) 03/10/2024   Lab Results  Component Value Date   TOTALPROTELP 6.5 03/10/2024   ALBUMINELP 4.2 03/10/2024   A1GS 0.2 03/10/2024   A2GS 0.6 03/10/2024   BETS 1.1 03/10/2024   GAMS 0.4 03/10/2024   MSPIKE Not Observed 03/10/2024   SPEI Comment 03/23/2023     Chemistry      Component Value Date/Time   NA 141 04/28/2024 0819   K 4.2 04/28/2024 0819   CL 101 04/28/2024 0819   CO2 26 04/28/2024 0819   BUN 15 04/28/2024 0819   CREATININE 1.19 04/28/2024 0819   CREATININE 1.02 04/19/2024 1134   CREATININE 1.07 02/26/2021 1130      Component Value Date/Time   CALCIUM  9.8 04/28/2024 0819   ALKPHOS 76 04/19/2024 1134   AST 35 04/19/2024 1134   ALT 60 (H) 04/19/2024 1134   BILITOT 0.4 04/19/2024 1134       Impression and Plan: Mr. Nishi is a very pleasant 54 yo gentleman with an aggressive CLL by virtue of the cytogenetics.   The Jaypirca  is now working.  I am happy about this.  His white cell count started to come down nicely.  We will still get him back in 2 months.  I think if everything looks good in 2 months, then we will try to get him back in 3 or 4 months.    I just want him to be able to have a nice retirement with his wife.   Maude JONELLE Crease, MD 2/6/202612:10 PM "

## 2024-08-18 ENCOUNTER — Inpatient Hospital Stay: Admitting: Hematology & Oncology

## 2024-08-18 ENCOUNTER — Inpatient Hospital Stay

## 2024-08-22 ENCOUNTER — Ambulatory Visit: Admitting: Infectious Disease
